# Patient Record
Sex: Male | Born: 1935 | Race: Asian | Hispanic: No | Marital: Married | Smoking: Never smoker
Health system: Southern US, Community
[De-identification: ages and names within clinical notes are randomized; demographics above are authoritative.]

## PROBLEM LIST (undated history)

## (undated) DIAGNOSIS — I639 Cerebral infarction, unspecified: Secondary | ICD-10-CM

## (undated) DIAGNOSIS — G459 Transient cerebral ischemic attack, unspecified: Secondary | ICD-10-CM

## (undated) DIAGNOSIS — E119 Type 2 diabetes mellitus without complications: Secondary | ICD-10-CM

## (undated) DIAGNOSIS — I10 Essential (primary) hypertension: Secondary | ICD-10-CM

## (undated) DIAGNOSIS — R270 Ataxia, unspecified: Secondary | ICD-10-CM

## (undated) DIAGNOSIS — M1990 Unspecified osteoarthritis, unspecified site: Secondary | ICD-10-CM

## (undated) DIAGNOSIS — E785 Hyperlipidemia, unspecified: Secondary | ICD-10-CM

## (undated) HISTORY — PX: OTHER SURGICAL HISTORY: SHX169

---

## 2004-12-09 ENCOUNTER — Ambulatory Visit: Payer: Self-pay

## 2004-12-12 ENCOUNTER — Ambulatory Visit: Payer: Self-pay

## 2005-08-18 ENCOUNTER — Ambulatory Visit: Payer: Self-pay | Admitting: Ophthalmology

## 2005-08-25 ENCOUNTER — Ambulatory Visit: Payer: Self-pay | Admitting: Ophthalmology

## 2007-05-21 ENCOUNTER — Ambulatory Visit: Payer: Self-pay | Admitting: Family Medicine

## 2010-07-31 ENCOUNTER — Encounter: Payer: Self-pay | Admitting: Family Medicine

## 2010-08-07 ENCOUNTER — Encounter: Payer: Self-pay | Admitting: Family Medicine

## 2010-08-14 ENCOUNTER — Ambulatory Visit: Payer: Self-pay | Admitting: Family Medicine

## 2010-09-05 ENCOUNTER — Encounter: Payer: Self-pay | Admitting: Family Medicine

## 2010-10-15 ENCOUNTER — Ambulatory Visit: Payer: Self-pay | Admitting: Family Medicine

## 2010-10-20 ENCOUNTER — Inpatient Hospital Stay: Payer: Self-pay | Admitting: Internal Medicine

## 2010-12-27 ENCOUNTER — Emergency Department: Payer: Self-pay | Admitting: Emergency Medicine

## 2012-06-15 ENCOUNTER — Emergency Department: Payer: Self-pay | Admitting: Emergency Medicine

## 2012-07-17 ENCOUNTER — Emergency Department: Payer: Self-pay | Admitting: Emergency Medicine

## 2012-07-17 LAB — COMPREHENSIVE METABOLIC PANEL
Anion Gap: 7 (ref 7–16)
Bilirubin,Total: 0.3 mg/dL (ref 0.2–1.0)
Chloride: 103 mmol/L (ref 98–107)
Creatinine: 1.26 mg/dL (ref 0.60–1.30)
EGFR (African American): >60
EGFR (Non-African Amer.): 52 — ABNORMAL LOW
Potassium: 3.8 mmol/L (ref 3.5–5.1)
SGPT (ALT): 23 U/L (ref 12–78)
Sodium: 137 mmol/L (ref 136–145)

## 2012-07-17 LAB — CBC
HCT: 37.9 % — ABNORMAL LOW (ref 40.0–52.0)
MCH: 30.9 pg (ref 26.0–34.0)
MCHC: 34.4 g/dL (ref 32.0–36.0)
MCV: 90 fL (ref 80–100)
Platelet: 230 10*3/uL (ref 150–440)
RDW: 13.7 % (ref 11.5–14.5)

## 2012-07-17 LAB — URINALYSIS, COMPLETE
Bacteria: NONE SEEN
Blood: NEGATIVE
Glucose,UR: 150 mg/dL (ref 0–75)
Ketone: NEGATIVE
Leukocyte Esterase: NEGATIVE
RBC,UR: 1 /HPF (ref 0–5)
Specific Gravity: 1.016 (ref 1.003–1.030)
Squamous Epithelial: <1
WBC UR: <1 /HPF (ref 0–5)

## 2012-07-17 LAB — CK TOTAL AND CKMB (NOT AT ARMC): CK-MB: 0.8 ng/mL (ref 0.5–3.6)

## 2012-07-19 ENCOUNTER — Encounter: Payer: Self-pay | Admitting: Family Medicine

## 2012-08-07 ENCOUNTER — Encounter: Payer: Self-pay | Admitting: Family Medicine

## 2012-09-04 ENCOUNTER — Encounter: Payer: Self-pay | Admitting: Family Medicine

## 2012-09-21 ENCOUNTER — Ambulatory Visit: Payer: Self-pay | Admitting: Family Medicine

## 2013-08-25 ENCOUNTER — Encounter: Payer: Self-pay | Admitting: Family Medicine

## 2013-09-04 ENCOUNTER — Encounter: Payer: Self-pay | Admitting: Family Medicine

## 2015-07-03 ENCOUNTER — Inpatient Hospital Stay: Payer: Medicare Other

## 2015-07-03 ENCOUNTER — Inpatient Hospital Stay
Admission: EM | Admit: 2015-07-03 | Discharge: 2015-07-05 | DRG: 065 | Disposition: A | Discharge status: Home / Self Care | Payer: Medicare Other | Attending: Internal Medicine | Admitting: Internal Medicine

## 2015-07-03 ENCOUNTER — Encounter: Payer: Self-pay | Admitting: Emergency Medicine

## 2015-07-03 ENCOUNTER — Emergency Department: Payer: Medicare Other

## 2015-07-03 DIAGNOSIS — I639 Cerebral infarction, unspecified: Secondary | ICD-10-CM | POA: Diagnosis present

## 2015-07-03 DIAGNOSIS — R531 Weakness: Secondary | ICD-10-CM | POA: Diagnosis not present

## 2015-07-03 DIAGNOSIS — Z23 Encounter for immunization: Secondary | ICD-10-CM | POA: Diagnosis not present

## 2015-07-03 DIAGNOSIS — Z87891 Personal history of nicotine dependence: Secondary | ICD-10-CM

## 2015-07-03 DIAGNOSIS — Z7982 Long term (current) use of aspirin: Secondary | ICD-10-CM | POA: Diagnosis not present

## 2015-07-03 DIAGNOSIS — Z8673 Personal history of transient ischemic attack (TIA), and cerebral infarction without residual deficits: Secondary | ICD-10-CM

## 2015-07-03 DIAGNOSIS — E119 Type 2 diabetes mellitus without complications: Secondary | ICD-10-CM | POA: Diagnosis not present

## 2015-07-03 DIAGNOSIS — Z888 Allergy status to other drugs, medicaments and biological substances status: Secondary | ICD-10-CM

## 2015-07-03 DIAGNOSIS — M1990 Unspecified osteoarthritis, unspecified site: Secondary | ICD-10-CM | POA: Diagnosis not present

## 2015-07-03 DIAGNOSIS — I6350 Cerebral infarction due to unspecified occlusion or stenosis of unspecified cerebral artery: Secondary | ICD-10-CM | POA: Diagnosis not present

## 2015-07-03 DIAGNOSIS — E785 Hyperlipidemia, unspecified: Secondary | ICD-10-CM | POA: Diagnosis not present

## 2015-07-03 DIAGNOSIS — G8191 Hemiplegia, unspecified affecting right dominant side: Secondary | ICD-10-CM | POA: Diagnosis present

## 2015-07-03 DIAGNOSIS — I10 Essential (primary) hypertension: Secondary | ICD-10-CM | POA: Diagnosis present

## 2015-07-03 DIAGNOSIS — Z79899 Other long term (current) drug therapy: Secondary | ICD-10-CM | POA: Diagnosis not present

## 2015-07-03 HISTORY — DX: Unspecified osteoarthritis, unspecified site: M19.90

## 2015-07-03 HISTORY — DX: Hyperlipidemia, unspecified: E78.5

## 2015-07-03 HISTORY — DX: Transient cerebral ischemic attack, unspecified: G45.9

## 2015-07-03 HISTORY — DX: Type 2 diabetes mellitus without complications: E11.9

## 2015-07-03 HISTORY — DX: Ataxia, unspecified: R27.0

## 2015-07-03 HISTORY — DX: Essential (primary) hypertension: I10

## 2015-07-03 LAB — COMPREHENSIVE METABOLIC PANEL
ALBUMIN: 4.2 g/dL (ref 3.5–5.0)
ALK PHOS: 65 U/L (ref 38–126)
ALT: 19 U/L (ref 17–63)
AST: 25 U/L (ref 15–41)
Anion gap: 8 (ref 5–15)
BUN: 23 mg/dL — AB (ref 6–20)
CALCIUM: 9.4 mg/dL (ref 8.9–10.3)
CO2: 28 mmol/L (ref 22–32)
CREATININE: 1.04 mg/dL (ref 0.61–1.24)
Chloride: 101 mmol/L (ref 101–111)
GFR calc non Af Amer: >60 mL/min (ref >60)
Glucose, Bld: 136 mg/dL — ABNORMAL HIGH (ref 65–99)
Potassium: 3.8 mmol/L (ref 3.5–5.1)
SODIUM: 137 mmol/L (ref 135–145)
TOTAL PROTEIN: 7.4 g/dL (ref 6.5–8.1)
Total Bilirubin: 0.6 mg/dL (ref 0.3–1.2)

## 2015-07-03 LAB — PROTIME-INR
INR: 1.03
Prothrombin Time: 13.7 seconds (ref 11.4–15.0)

## 2015-07-03 LAB — URINALYSIS COMPLETE WITH MICROSCOPIC (ARMC ONLY)
BILIRUBIN URINE: NEGATIVE
Bacteria, UA: NONE SEEN
Glucose, UA: 50 mg/dL — AB
Ketones, ur: NEGATIVE mg/dL
Leukocytes, UA: NEGATIVE
Nitrite: NEGATIVE
PH: 5 (ref 5.0–8.0)
PROTEIN: NEGATIVE mg/dL
SQUAMOUS EPITHELIAL / LPF: NONE SEEN
Specific Gravity, Urine: 1.011 (ref 1.005–1.030)
WBC, UA: NONE SEEN WBC/hpf (ref 0–5)

## 2015-07-03 LAB — APTT: aPTT: 31 seconds (ref 24–36)

## 2015-07-03 LAB — MRSA PCR SCREENING: MRSA by PCR: NEGATIVE

## 2015-07-03 LAB — CBC
HCT: 40.4 % (ref 40.0–52.0)
HCT: 42.6 % (ref 40.0–52.0)
HEMOGLOBIN: 14.3 g/dL (ref 13.0–18.0)
Hemoglobin: 13.6 g/dL (ref 13.0–18.0)
MCH: 29 pg (ref 26.0–34.0)
MCH: 29.1 pg (ref 26.0–34.0)
MCHC: 33.6 g/dL (ref 32.0–36.0)
MCHC: 33.6 g/dL (ref 32.0–36.0)
MCV: 86.3 fL (ref 80.0–100.0)
MCV: 86.4 fL (ref 80.0–100.0)
PLATELETS: 200 10*3/uL (ref 150–440)
Platelets: 206 10*3/uL (ref 150–440)
RBC: 4.68 MIL/uL (ref 4.40–5.90)
RBC: 4.93 MIL/uL (ref 4.40–5.90)
RDW: 13.6 % (ref 11.5–14.5)
RDW: 13.7 % (ref 11.5–14.5)
WBC: 6.7 10*3/uL (ref 3.8–10.6)
WBC: 7.9 10*3/uL (ref 3.8–10.6)

## 2015-07-03 LAB — CREATININE, SERUM
CREATININE: 0.96 mg/dL (ref 0.61–1.24)
GFR calc Af Amer: >60 mL/min (ref >60)

## 2015-07-03 LAB — TROPONIN I

## 2015-07-03 MED ORDER — ASPIRIN 81 MG PO CHEW
81.0000 mg | CHEWABLE_TABLET | Freq: Every day | ORAL | Status: DC
  Administered 2015-07-04 – 2015-07-05 (×2): 81 mg via ORAL
  Filled 2015-07-03 (×2): qty 1

## 2015-07-03 MED ORDER — MELOXICAM 7.5 MG PO TABS
7.5000 mg | ORAL_TABLET | Freq: Every day | ORAL | Status: DC
Start: 2015-07-03 — End: 2015-07-05
  Administered 2015-07-04 – 2015-07-05 (×2): 7.5 mg via ORAL
  Filled 2015-07-03 (×3): qty 1

## 2015-07-03 MED ORDER — METOPROLOL SUCCINATE ER 50 MG PO TB24
50.0000 mg | ORAL_TABLET | Freq: Every day | ORAL | Status: DC
  Administered 2015-07-03 – 2015-07-05 (×3): 50 mg via ORAL
  Filled 2015-07-03 (×3): qty 1

## 2015-07-03 MED ORDER — HYDRALAZINE HCL 20 MG/ML IJ SOLN
20.0000 mg | Freq: Four times a day (QID) | INTRAMUSCULAR | Status: DC | PRN
  Administered 2015-07-03: 20 mg via INTRAVENOUS
  Filled 2015-07-03: qty 1

## 2015-07-03 MED ORDER — VITAMIN B-12 1000 MCG PO TABS
1000.0000 ug | ORAL_TABLET | Freq: Every day | ORAL | Status: DC
Start: 2015-07-03 — End: 2015-07-05
  Administered 2015-07-04 – 2015-07-05 (×2): 1000 ug via ORAL
  Filled 2015-07-03 (×2): qty 1

## 2015-07-03 MED ORDER — GLIPIZIDE ER 5 MG PO TB24
5.0000 mg | ORAL_TABLET | Freq: Every day | ORAL | Status: DC
Start: 2015-07-03 — End: 2015-07-05
  Administered 2015-07-04 – 2015-07-05 (×2): 5 mg via ORAL
  Filled 2015-07-03 (×2): qty 1

## 2015-07-03 MED ORDER — ASPIRIN EC 325 MG PO TBEC: 325.0000 mg | DELAYED_RELEASE_TABLET | Freq: Every day | ORAL | Status: DC

## 2015-07-03 MED ORDER — CLOPIDOGREL BISULFATE 75 MG PO TABS
75.0000 mg | ORAL_TABLET | Freq: Every day | ORAL | Status: DC
  Administered 2015-07-04 – 2015-07-05 (×2): 75 mg via ORAL
  Filled 2015-07-03 (×2): qty 1

## 2015-07-03 MED ORDER — ADULT MULTIVITAMIN W/MINERALS CH
1.0000 | ORAL_TABLET | Freq: Every day | ORAL | Status: DC
  Administered 2015-07-04 – 2015-07-05 (×2): 1 via ORAL
  Filled 2015-07-03 (×2): qty 1

## 2015-07-03 MED ORDER — ENOXAPARIN SODIUM 40 MG/0.4ML ~~LOC~~ SOLN
40.0000 mg | SUBCUTANEOUS | Status: DC
  Administered 2015-07-04: 40 mg via SUBCUTANEOUS
  Filled 2015-07-03: qty 0.4

## 2015-07-03 MED ORDER — PNEUMOCOCCAL VAC POLYVALENT 25 MCG/0.5ML IJ INJ
0.5000 mL | INJECTION | INTRAMUSCULAR | Status: AC
  Administered 2015-07-05: 11:00:00 0.5 mL via INTRAMUSCULAR
  Filled 2015-07-03: qty 0.5

## 2015-07-03 MED ORDER — ASPIRIN 81 MG PO CHEW
324.0000 mg | CHEWABLE_TABLET | Freq: Once | ORAL | Status: AC
  Administered 2015-07-03: 324 mg via ORAL
  Filled 2015-07-03: qty 4

## 2015-07-03 MED ORDER — PRAVASTATIN SODIUM 40 MG PO TABS
40.0000 mg | ORAL_TABLET | Freq: Every day | ORAL | Status: DC
  Administered 2015-07-04 – 2015-07-05 (×2): 40 mg via ORAL
  Filled 2015-07-03: qty 1
  Filled 2015-07-03: qty 2

## 2015-07-03 MED ORDER — ENALAPRIL MALEATE 10 MG PO TABS
20.0000 mg | ORAL_TABLET | Freq: Two times a day (BID) | ORAL | Status: DC
  Administered 2015-07-03: 20 mg via ORAL
  Filled 2015-07-03: qty 1
  Filled 2015-07-03: qty 2

## 2015-07-03 MED ORDER — INFLUENZA VAC SPLIT QUAD 0.5 ML IM SUSY
0.5000 mL | PREFILLED_SYRINGE | INTRAMUSCULAR | Status: AC
  Administered 2015-07-05: 0.5 mL via INTRAMUSCULAR
  Filled 2015-07-03: qty 0.5

## 2015-07-03 MED ORDER — METFORMIN HCL 500 MG PO TABS
1000.0000 mg | ORAL_TABLET | Freq: Two times a day (BID) | ORAL | Status: DC
  Administered 2015-07-03 – 2015-07-05 (×4): 1000 mg via ORAL
  Filled 2015-07-03 (×4): qty 2

## 2015-07-03 NOTE — ED Notes (Signed)
Patient transported to MRI 

## 2015-07-03 NOTE — ED Notes (Signed)
States on Sunday morning at 0200, experienced right sided weakness, making it difficult for patient to walk around and move.  Patient states he has been getting better since onset, but does not feel totally back to normal.

## 2015-07-03 NOTE — Progress Notes (Signed)
ANTICOAGULATION CONSULT NOTE - Initial Consult  Pharmacy Consult for Lovenox  Indication: VTE prophylaxis  Allergies  Allergen Reactions  . Hydrochlorothiazide     Unknown reaction    Patient Measurements: Height: 5\' 5"  (165.1 cm) Weight: 149 lb 14.6 oz (68 kg) IBW/kg (Calculated) : 61.5 Heparin Dosing Weight:   Vital Signs: Temp: 97.6 F (36.4 C) (12/27 1934) Temp Source: Oral (12/27 1934) BP: 194/98 mmHg (12/27 1934) Pulse Rate: 72 (12/27 1934)  Labs:  Recent Labs  07/03/15 1422  HGB 13.6  HCT 40.4  PLT 200  APTT 31  LABPROT 13.7  INR 1.03  CREATININE 1.04  TROPONINI <0.03    Estimated Creatinine Clearance: 43.5 mL/min (by C-G formula based on Cr of 1.04).   Medical History: Past Medical History  Diagnosis Date  . TIA (transient ischemic attack)   . Diabetes mellitus without complication (HCC)   . Hypertension   . Dyslipidemia   . Arthritis   . Ataxia     Medications:  Prescriptions prior to admission  Medication Sig Dispense Refill Last Dose  . aspirin EC 325 MG tablet Take 325 mg by mouth daily.   07/03/2015 at Unknown time  . clopidogrel (PLAVIX) 75 MG tablet Take 75 mg by mouth daily.   07/03/2015 at Unknown time  . enalapril (VASOTEC) 20 MG tablet Take 1 tablet by mouth 2 (two) times daily.   07/03/2015 at Unknown time  . glipiZIDE (GLUCOTROL XL) 5 MG 24 hr tablet Take 1 tablet by mouth daily.   07/03/2015 at Unknown time  . meloxicam (MOBIC) 7.5 MG tablet Take 1 tablet by mouth daily.   07/03/2015 at Unknown time  . metFORMIN (GLUCOPHAGE) 500 MG tablet Take 2 tablets by mouth 2 (two) times daily.   07/03/2015 at Unknown time  . metoprolol succinate (TOPROL-XL) 50 MG 24 hr tablet Take 1 tablet by mouth daily.   07/03/2015 at Unknown time  . Multiple Vitamin (MULTI-VITAMINS) TABS Take 1 tablet by mouth daily.   07/03/2015 at Unknown time  . pravastatin (PRAVACHOL) 40 MG tablet Take 40 mg by mouth daily.   07/03/2015 at Unknown time  .  triamcinolone ointment (KENALOG) 0.1 % Apply 1 application topically 2 (two) times daily.   07/03/2015 at Unknown time  . vitamin B-12 (CYANOCOBALAMIN) 1000 MCG tablet Take 1,000 mcg by mouth daily.   07/03/2015 at Unknown time    Assessment: CrCl = 44 ml/min  Goal of Therapy:  DVT prophylaxis   Plan:  Lovenox 30 mg SQ Q24H originally ordered.  Will adjust dose to lovenox 40 mg SQ Q24H based on CrCl > 30 ml/min.   Charman Blasco D 07/03/2015,7:52 PM

## 2015-07-03 NOTE — ED Provider Notes (Signed)
[facility] Emergency Department Provider Note  ____________________________________________  Time seen: On arrival  I have reviewed the triage vital signs and the nursing notes.   HISTORY  Chief Complaint Weakness    HPI Christopher [name] is a 81 y.o. male who reports 2 days ago he lost the ability to move his right arm and right leg for several hours. He reports this did improve and his strength feels almost equivalent today. He denies headache. He does have a history of diabetes and high blood pressure. His son believes he has had a TIA in the past. His son feels that his speech is not normal and when he ambulates he apparently is dragging his leg per his physician.     Past Medical History  Diagnosis Date  . TIA (transient ischemic attack)   . Diabetes mellitus without complication (HCC)   . Hypertension     There are no active problems to display for this patient.   History reviewed. No pertinent past surgical history.  No current outpatient prescriptions on file.  Allergies Hydrochlorothiazide  No family history on file.  Social History Social History  Substance Use Topics  . Smoking status: Former Games developermoker  . Smokeless tobacco: None  . Alcohol Use: No    Review of Systems  Constitutional: Negative for fever. Eyes: Negative for visual changes. ENT: Negative for sore throat Cardiovascular: Negative for chest pain. Respiratory: Negative for shortness of breath. Gastrointestinal: Negative for abdominal pain, vomiting  Genitourinary: Negative for dysuria. Musculoskeletal: Negative for back pain. Skin: Negative for rash. Neurological: As above Psychiatric:No anxiety    ____________________________________________   PHYSICAL EXAM:  VITAL SIGNS: ED Triage Vitals  Enc Vitals Group     BP 07/03/15 1312 179/94 mmHg     Pulse Rate 07/03/15 1312 87     Resp 07/03/15 1312 18     Temp 07/03/15 1312 98 F (36.7 C)     Temp Source  07/03/15 1312 Oral     SpO2 07/03/15 1312 96 %     Weight 07/03/15 1312 152 lb (68.947 kg)     Height 07/03/15 1312 5\' 6"  (1.676 m)     Head Cir --      Peak Flow --      Pain Score --      Pain Loc --      Pain Edu? --      Excl. in GC? --      Constitutional: Alert and oriented. Well appearing and in no distress. Eyes: Conjunctivae are normal.  ENT   Head: Normocephalic and atraumatic.   Mouth/Throat: Mucous membranes are moist. Cardiovascular: Normal rate, regular rhythm. Normal and symmetric distal pulses are present in all extremities. No murmurs, rubs, or gallops. Respiratory: Normal respiratory effort without tachypnea nor retractions. Breath sounds are clear and equal bilaterally.  Gastrointestinal: Soft and non-tender in all quadrants. No distention. There is no CVA tenderness. Genitourinary: deferred Musculoskeletal: Nontender with normal range of motion in all extremities. No lower extremity tenderness nor edema. Neurologic:  Some difficulty with word finding. Strength appears normal in all extremities.. Cranial nerves II through XII are normal. Skin:  Skin is warm, dry and intact. No rash noted. Psychiatric: Mood and affect are normal. Patient exhibits appropriate insight and judgment.  ____________________________________________    LABS (pertinent positives/negatives)  Labs Reviewed  APTT  COMPREHENSIVE METABOLIC PANEL  TROPONIN I  PROTIME-INR  CBC  URINALYSIS COMPLETEWITH MICROSCOPIC ([facility] ONLY)    ____________________________________________   EKG  ED ECG REPORT I, Jene Every, the attending physician, personally viewed and interpreted this ECG.  Date: 07/03/2015 EKG Time: 2:13 PM Rate: 87 Rhythm: normal sinus rhythm QRS Axis: normal Intervals: normal ST/T Wave abnormalities: normal Conduction Disutrbances: LAFB Narrative Interpretation: unremarkable   ____________________________________________    RADIOLOGY I have personally  reviewed any xrays that were ordered on this patient: CT shows possible new lacunar infarcts  ____________________________________________   PROCEDURES  Procedure(s) performed: none  Critical Care performed: none  ____________________________________________   INITIAL IMPRESSION / ASSESSMENT AND PLAN / ED COURSE  Pertinent labs & imaging results that were available during my care of the patient were reviewed by me and considered in my medical decision making (see chart for details).  History of present illness is concerning for TIA versus CVA. He does apparently have some residual right leg weakness. He also has a history of diabetes and high blood pressure. His CT scan is concerning for new lacunar infarcts  I discussed the case with Dr. Loretha Brasil who recommends admission for MRI and full stroke workup. Aspirin given in the emergency department  ____________________________________________   FINAL CLINICAL IMPRESSION(S) / ED DIAGNOSES  Final diagnoses:  Cerebral infarction due to unspecified mechanism     Jene Every, MD 07/03/15 [phone]

## 2015-07-03 NOTE — H&P (Signed)
[facility] [place] at [facility]   PATIENT NAME: Christopher [name]    MR#:  [id number]  DATE OF BIRTH:  [birth year]  DATE OF ADMISSION:  07/03/2015  PRIMARY CARE PHYSICIAN: No primary care provider on file.   REQUESTING/REFERRING PHYSICIAN: Dr. Cyril Loosen  CHIEF COMPLAINT: Right-sided weakness    Chief Complaint  Patient presents with  . Weakness    HISTORY OF PRESENT ILLNESS:  [name]  is a 81 y.o. male with a known history of hypertension, diabetes mellitus, hyperlipidemia noted to have right-sided weakness Saturday. Patient had difficulty walking secondary to weakness on the right side. Patient symptoms slowly improved and he feels little stronger on the right side compared to 2 days ago. However CT of the head showed left thalamic stroke in the emergency room. Patient had perioral numbness on the right side on Saturday. Now he denies any numbness of hand leg, right side of the face and right side of her lips. Does have a slurred speech according to him he has been having this slurred speech for like 4 months. Past Medical History  Diagnosis Date  . TIA (transient ischemic attack)   . Diabetes mellitus without complication (HCC)   . Hypertension   . Dyslipidemia   . Arthritis   . Ataxia     PAST SURGICAL HISTOIRY:  History reviewed. No pertinent past surgical history. . No previous surgeries. SOCIAL HISTORY:   Social History  Substance Use Topics  . Smoking status: Former Games developer  . Smokeless tobacco: Former Neurosurgeon  . Alcohol Use: No    FAMILY HISTORY:  No family history on file.  DRUG ALLERGIES:   Allergies  Allergen Reactions  . Hydrochlorothiazide     Unknown reaction    REVIEW OF SYSTEMS:  CONSTITUTIONAL: No fever, fatigue or weakness.  EYES: No blurred or double vision.  EARS, NOSE, AND THROAT: No tinnitus or ear pain.  RESPIRATORY: No cough, shortness of breath, wheezing or hemoptysis.  CARDIOVASCULAR: No chest pain, orthopnea,  edema.  GASTROINTESTINAL: No nausea, vomiting, diarrhea or abdominal pain.  GENITOURINARY: No dysuria, hematuria.  ENDOCRINE: No polyuria, nocturia,  HEMATOLOGY: No anemia, easy bruising or bleeding SKIN: No rash or lesion. MUSCULOSKELETAL: No joint pain or arthritis.   NEUROLOGIC: Slight right-sided weakness, slurred speech noted.Marland Kitchen  PSYCHIATRY: No anxiety or depression.   MEDICATIONS AT HOME:   Prior to Admission medications   Medication Sig Start Date End Date Taking? Authorizing Provider  aspirin EC 325 MG tablet Take 325 mg by mouth daily.   Yes Historical Provider, MD  clopidogrel (PLAVIX) 75 MG tablet Take 75 mg by mouth daily. 01/21/11  Yes Historical Provider, MD  enalapril (VASOTEC) 20 MG tablet Take 1 tablet by mouth 2 (two) times daily. 06/21/15  Yes Historical Provider, MD  glipiZIDE (GLUCOTROL XL) 5 MG 24 hr tablet Take 1 tablet by mouth daily. 06/21/15  Yes Historical Provider, MD  meloxicam (MOBIC) 7.5 MG tablet Take 1 tablet by mouth daily. 06/21/15  Yes Historical Provider, MD  metFORMIN (GLUCOPHAGE) 500 MG tablet Take 2 tablets by mouth 2 (two) times daily. 04/23/15  Yes Historical Provider, MD  metoprolol succinate (TOPROL-XL) 50 MG 24 hr tablet Take 1 tablet by mouth daily. 06/21/15  Yes Historical Provider, MD  Multiple Vitamin (MULTI-VITAMINS) TABS Take 1 tablet by mouth daily.   Yes Historical Provider, MD  pravastatin (PRAVACHOL) 40 MG tablet Take 40 mg by mouth daily. 01/21/11  Yes Historical Provider, MD  triamcinolone ointment (KENALOG) 0.1 %  Apply 1 application topically 2 (two) times daily.   Yes Historical Provider, MD  vitamin B-12 (CYANOCOBALAMIN) 1000 MCG tablet Take 1,000 mcg by mouth daily.   Yes Historical Provider, MD      VITAL SIGNS:  Blood pressure 215/111, pulse 78, temperature 97.8 F (36.6 C), temperature source Oral, resp. rate 18, height 5\' 3"  (1.6 m), weight 69.854 kg (154 lb), SpO2 100 %.  PHYSICAL EXAMINATION:  GENERAL:  81 y.o.-year-old  patient lying in the bed with no acute distress.  EYES: Pupils equal, round, reactive to light and accommodation. No scleral icterus. Extraocular muscles intact.  HEENT: Head atraumatic, normocephalic. Oropharynx and nasopharynx clear.  NECK:  Supple, no jugular venous distention. No thyroid enlargement, no tenderness.  LUNGS: Normal breath sounds bilaterally, no wheezing, rales,rhonchi or crepitation. No use of accessory muscles of respiration.  CARDIOVASCULAR: S1, S2 normal. No murmurs, rubs, or gallops.  ABDOMEN: Soft, nontender, nondistended. Bowel sounds present. No organomegaly or mass.  EXTREMITIES: No pedal edema, cyanosis, or clubbing.  NEUROLOGIC: Cranial nerves II through XII are intact. Muscle strength 5/5 in all extremities. Sensation intact. Gait not checked.  PSYCHIATRIC: The patient is alert and oriented x 3.  SKIN: No obvious rash, lesion, or ulcer.   LABORATORY PANEL:   CBC  Recent Labs Lab 07/03/15 1422  WBC 6.7  HGB 13.6  HCT 40.4  PLT 200   ------------------------------------------------------------------------------------------------------------------  Chemistries   Recent Labs Lab 07/03/15 1422  NA 137  K 3.8  CL 101  CO2 28  GLUCOSE 136*  BUN 23*  CREATININE 1.04  CALCIUM 9.4  AST 25  ALT 19  ALKPHOS 65  BILITOT 0.6   ------------------------------------------------------------------------------------------------------------------  Cardiac Enzymes  Recent Labs Lab 07/03/15 1422  TROPONINI <0.03   ------------------------------------------------------------------------------------------------------------------  RADIOLOGY:  Ct Head Wo Contrast  07/03/2015  CLINICAL DATA:  Right-sided weakness. EXAM: CT HEAD WITHOUT CONTRAST TECHNIQUE: Contiguous axial images were obtained from the base of the skull through the vertex without intravenous contrast. COMPARISON:  07/17/2012 FINDINGS: Mild cerebral atrophy is within normal limits for age.  There are 5 mm hypodensities in the thalami, more well-defined on the left and both new from the prior CT. There is no evidence of acute large territory cortical infarct, intracranial hemorrhage, mass, midline shift, or extra-axial fluid collection. Periventricular white-matter hypodensities are similar to the prior study and nonspecific but compatible with mild chronic small vessel ischemic disease. A chronic lacunar infarct is noted in the white matter adjacent to the right frontal horn. Prior bilateral cataract extraction is noted. The visualized paranasal sinuses and mastoid air cells are clear. Calcified atherosclerosis is noted at the skullbase. No skull fracture is seen. IMPRESSION: 1. No acute intracranial hemorrhage. 2. New lacunar infarcts in the thalami. These are of indeterminate acuity, although the left-sided infarct is favored to be chronic. 3. Chronic small vessel ischemic disease in the cerebral white matter. Electronically Signed   By: Sebastian AcheAllen  Grady M.D.   On: 07/03/2015 13:40    EKG:   Orders placed or performed during the hospital encounter of 07/03/15  . ED EKG  . ED EKG   sinus rhythm with 87 bpm   IMPRESSION AND PLAN:  [age] year old the male patient with right-sided weakness found to have a left thalamic stroke admitted to hospitalist service to stroke unit for acute thalamic stroke. Right-sided weakness, right perioral numbness symptoms are resolved at this time but because of his risk factors of hypertension diabetes patient needs further workup including MRI of the  brain and carotid ultrasound echocardiogram, neurology consult, neuro checks. Continue aspirin, Plavix, statins, physical therapy consult, speech therapy consult, obtain hemoglobin A1c, fasting lipids.  #2 hypertension: Patient blood pressure is elevated to 220/142. Because of that Should keep the blood pressure around 190/90 to keep the cerebral perfusion. His home medications metoprolol, Vasotec., restarted #3  diabetes mellitus type 2: Restarted home medications, had insulin with sliding scale coverage  D/w the plan with  Pt,he is agreeable for further work up Dr.Zelikman is contacted by ER MD..     All the records are reviewed and case discussed with ED provider. Management plans discussed with the patient, family and they are in agreement.  CODE STATUS: full  TOTAL TIME TAKING CARE OF THIS PATIENT: 55 minutes.    Katha Hamming M.D on 07/03/2015 at 4:37 PM  Between 7am to 6pm - Pager - [phone]  After 6pm go to [email or web address] - password [email or web address] [facility]  [place]  Office  [phone]  CC: Primary care physician; No primary care provider on file.  Note: This dictation was prepared with Dragon dictation along with smaller phrase technology. Any transcriptional errors that result from this process are unintentional.

## 2015-07-03 NOTE — ED Notes (Signed)
Patient's Daughter in law's number (first contact):  Georgeann OppenheimFariha Saif  [phone].  Second contact is patient's son:  Burna SisSaif  [phone]

## 2015-07-03 NOTE — ED Notes (Signed)
Called patient's daughter in law to update on patient's planned admission, no answer, message left.

## 2015-07-03 NOTE — ED Notes (Signed)
REturn from MRI.  Family at bedside.

## 2015-07-03 NOTE — ED Notes (Signed)
Pt with right sided weakness started on Saturday. Saw pcp and was sent to ed for further eval. Pt able to move right arm and leg in triage. Pt with hx of tia.

## 2015-07-03 NOTE — Progress Notes (Signed)
eLink Physician-Brief Progress Note Patient Name: Christopher [name] DOB: 07/07/1926 MRN: [id number]   Date of Service  07/03/2015  HPI/Events of Note  81 yo with acute CVA  eICU Interventions  Avoid secondary brain injury, recommend DNR status, SD status.         [name] 07/03/2015, 8:57 PM

## 2015-07-04 ENCOUNTER — Inpatient Hospital Stay (HOSPITAL_COMMUNITY)
Admit: 2015-07-04 | Discharge: 2015-07-04 | Disposition: A | Discharge status: Home / Self Care | Payer: Medicare Other | Attending: Internal Medicine | Admitting: Internal Medicine

## 2015-07-04 DIAGNOSIS — I6350 Cerebral infarction due to unspecified occlusion or stenosis of unspecified cerebral artery: Secondary | ICD-10-CM

## 2015-07-04 DIAGNOSIS — I10 Essential (primary) hypertension: Secondary | ICD-10-CM

## 2015-07-04 DIAGNOSIS — I639 Cerebral infarction, unspecified: Principal | ICD-10-CM

## 2015-07-04 LAB — GLUCOSE, CAPILLARY
GLUCOSE-CAPILLARY: 119 mg/dL — AB (ref 65–99)
Glucose-Capillary: 118 mg/dL — ABNORMAL HIGH (ref 65–99)

## 2015-07-04 LAB — HEMOGLOBIN A1C: Hgb A1c MFr Bld: 7.3 % — ABNORMAL HIGH (ref 4.0–6.0)

## 2015-07-04 NOTE — Evaluation (Signed)
Physical Therapy Evaluation Patient Details Name: Christopher [name] MRN: [id number] DOB: Apr 15, 1926 Today's Date: 07/04/2015   History of Present Illness  Pt is an 81 y.o. male presenting to hospital with R sided weakness since Saturday with difficulty walking.  MRI showing acute L posterior limb internal capsule, corticalspinal tract, and L thalamic subcentimeter infarction without hemorrhage; also numerous microbleeds representing chronic hypertensive cerebrovascular disease; also severe cervical spondylosis.  PMH includes htn, DM, HLD, TIA, ataxia.  Clinical Impression  Prior to admission, pt was independent ambulating with quad cane.  Pt [place]  Currently pt is CGA with transfers and CGA to min assist with ambulation 160 feet with quad cane (pt with occasional loss of balance to R requiring min assist to steady with mild LE ataxia noted but otherwise CGA).  Pt would benefit from skilled PT to address noted impairments and functional limitations.  Recommend pt discharge to home with 24/7 assist and HHPT when medically appropriate.     Follow Up Recommendations Home health PT;Supervision/Assistance - 24 hour    Equipment Recommendations       Recommendations for Other Services       Precautions / Restrictions Precautions Precautions: Fall Restrictions Weight Bearing Restrictions: No      Mobility  Bed Mobility Overal bed mobility: Needs Assistance Bed Mobility: Supine to Sit;Sit to Supine     Supine to sit: Modified independent (Device/Increase time);HOB elevated Sit to supine: Modified independent (Device/Increase time);HOB elevated      Transfers Overall transfer level: Needs assistance Equipment used: Quad cane Transfers: Sit to/from Stand Sit to Stand: Min guard         General transfer comment: pt pushing LE's against back of bed to stand; no loss of balance noted  Ambulation/Gait Ambulation/Gait assistance: Min  guard;Min assist Ambulation Distance (Feet): 160 Feet Assistive device: Quad cane   Gait velocity: varying   General Gait Details: occasional loss of balance to R requiring min assist to steady but otherwise CGA; decreased B step length/foot clearance/heelstrike  Stairs            Wheelchair Mobility    Modified Rankin (Stroke Patients Only)       Balance Overall balance assessment: Needs assistance Sitting-balance support: Bilateral upper extremity supported;Feet supported Sitting balance-Leahy Scale: Good     Standing balance support: Single extremity supported (on quad cane) Standing balance-Leahy Scale: Good                               Pertinent Vitals/Pain Pain Assessment: No/denies pain  See flowsheet for details of vitals.    Home Living Family/patient expects to be discharged to:: Private residence Living Arrangements: Children;Spouse/significant other (Wife, 2 sons, and daughter) Available Help at Discharge: Family   Home Access: Ramped entrance     Home Layout: Two level;Bed/bath upstairs Home Equipment: Cane - quad      Prior Function Level of Independence: Independent with assistive device(s)         Comments: Pt reports h/o balance issues and was told to always use his quad cane.  Pt denies any recent falls.  Pt reports his wife is very "forgetful" and can't help much.     Hand Dominance        Extremity/Trunk Assessment   Upper Extremity Assessment: RUE deficits/detail;LUE deficits/detail RUE Deficits / Details: R shoulder flexion, elbow flexion/extension all 4+/5; good hand grip  LUE Deficits / Details: L shoulder flexion, elbow flexion/extension, and hand grip WFL   Lower Extremity Assessment: RLE deficits/detail;LLE deficits/detail RLE Deficits / Details: R hip flexion, R knee flexion/extension, and DF 4/5; normal proprioception LLE Deficits / Details: L hip flexion, L knee flexion/extension, and DF WFL; normal  proprioception; mild coordination deficits noted with heel to shin B     Communication   Communication: HOH  Cognition Arousal/Alertness: Awake/alert Behavior During Therapy: WFL for tasks assessed/performed (impulsive at times) Overall Cognitive Status: Within Functional Limits for tasks assessed                      General Comments   Nursing cleared pt for participation in physical therapy.  Pt agreeable to PT session.    Exercises        Assessment/Plan    PT Assessment Patient needs continued PT services  PT Diagnosis Difficulty walking   PT Problem List Decreased balance;Decreased coordination  PT Treatment Interventions DME instruction;Gait training;Stair training;Functional mobility training;Therapeutic activities;Therapeutic exercise;Balance training;Neuromuscular re-education;Patient/family education   PT Goals (Current goals can be found in the Care Plan section) Acute Rehab PT Goals Patient Stated Goal: to go home PT Goal Formulation: With patient Time For Goal Achievement: 07/18/15 Potential to Achieve Goals: Good    Frequency 7X/week   Barriers to discharge        Co-evaluation               End of Session Equipment Utilized During Treatment: Gait belt Activity Tolerance: Patient tolerated treatment well Patient left: in bed;with call bell/phone within reach;with bed alarm set Nurse Communication: Mobility status;Precautions         Time: 1455-1520 PT Time Calculation (min) (ACUTE ONLY): 25 min   Charges:   PT Evaluation $Initial PT Evaluation Tier I: 1 Procedure     PT G CodesHendricks Limes July 18, 2015, 3:47 PM Hendricks Limes, PT [phone]

## 2015-07-04 NOTE — Progress Notes (Signed)
*  PRELIMINARY RESULTS* Echocardiogram 2D Echocardiogram has been performed.  Georgann HousekeeperJerry R Hege 07/04/2015, 2:57 PM

## 2015-07-04 NOTE — Progress Notes (Signed)
Report given to [facility].  Pt is alert and oriented. VSS. Has been up with PT. Transfer to room 117.  Left voice mail for son to call back so we could give him room number.

## 2015-07-04 NOTE — Progress Notes (Signed)
I have spoke with pt's son in person and on phone multiple times.  Neurologist has son's phone number and will call him. Son has been informed that test results come from the doctor. Also that pt has gotten all of his ordered medications.  Also that the stroke is permanent although the weakness has resolved. Pt's son asks same questions many times to staff and providers.  Pt's bed alarm is on. Lunch and dinner have been ordered by the son to facilitate cultural needs. Pt given newspaper. Awaiting transfer to floor

## 2015-07-04 NOTE — Progress Notes (Signed)
Pt remains alert and oriented, no complaints of pain.  NSR, lungs clear on room air.  Neuro check: all extremities strong, slight slurred speech.  BP was marginal earlier, now sbp 120-140's.  Voids in urinal.  VSS, afebrile.

## 2015-07-04 NOTE — H&P (Signed)
CC: R sided weakness   HPI: Christopher [name] is an 81 y.o. male  with a known history of hypertension, diabetes mellitus, hyperlipidemia noted to have right-sided weakness Saturday. Patient had difficulty walking secondary to weakness on the right side. Patient symptoms slowly improved and he feels little stronger on the right side compared to 2 days ago. CT of the head showed left thalamic stroke in the emergency room. Now back to baseline.  MRI brain L thalamic in L posterior limb internal capsule stroke.    Past Medical History  Diagnosis Date  . TIA (transient ischemic attack)   . Diabetes mellitus without complication (HCC)   . Hypertension   . Dyslipidemia   . Arthritis   . Ataxia     History reviewed. No pertinent past surgical history.  No family history on file.  Social History:  reports that he has quit smoking. He has quit using smokeless tobacco. He reports that he does not drink alcohol or use illicit drugs.  Allergies  Allergen Reactions  . Hydrochlorothiazide     Unknown reaction    Medications: I have reviewed the patient's current medications.  ROS: History obtained from the patient  General ROS: negative for - chills, fatigue, fever, night sweats, weight gain or weight loss Psychological ROS: negative for - behavioral disorder, hallucinations, memory difficulties, mood swings or suicidal ideation Ophthalmic ROS: negative for - blurry vision, double vision, eye pain or loss of vision ENT ROS: negative for - epistaxis, nasal discharge, oral lesions, sore throat, tinnitus or vertigo Allergy and Immunology ROS: negative for - hives or itchy/watery eyes Hematological and Lymphatic ROS: negative for - bleeding problems, bruising or swollen lymph nodes Endocrine ROS: negative for - galactorrhea, hair pattern changes, polydipsia/polyuria or temperature intolerance Respiratory ROS: negative for - cough, hemoptysis, shortness of breath or wheezing Cardiovascular ROS:  negative for - chest pain, dyspnea on exertion, edema or irregular heartbeat Gastrointestinal ROS: negative for - abdominal pain, diarrhea, hematemesis, nausea/vomiting or stool incontinence Genito-Urinary ROS: negative for - dysuria, hematuria, incontinence or urinary frequency/urgency Musculoskeletal ROS: negative for - joint swelling or muscular weakness Neurological ROS: as noted in HPI Dermatological ROS: negative for rash and skin lesion changes  Physical Examination: Blood pressure 128/70, pulse 79, temperature 97.4 F (36.3 C), temperature source Oral, resp. rate 19, height  (1.651 m), weight 149 lb 14.6 oz (68 kg), SpO2 99 %.  HEENT-  Normocephalic, no lesions, without obvious abnormality.  Normal external eye and conjunctiva.  Normal TM's bilaterally.  Normal auditory canals and external ears. Normal external nose, mucus membranes and septum.  Normal pharynx. Neurological Examination Mental Status: Alert, oriented, thought content appropriate.  Speech fluent without evidence of aphasia.  Able to follow 3 step commands without difficulty. Cranial Nerves: II: Discs flat bilaterally; Visual fields grossly normal, pupils equal, round, reactive to light and accommodation III,IV, VI: ptosis not present, extra-ocular motions intact bilaterally V,VII: smile symmetric, facial light touch sensation normal bilaterally VIII: hearing normal bilaterally IX,X: gag reflex present XI: bilateral shoulder shrug XII: midline tongue extension Motor: Right : Upper extremity   5/5    Left:     Upper extremity   5/5  Lower extremity   5/5     Lower extremity   5/5 Tone and bulk:normal tone throughout; no atrophy noted Sensory: Pinprick and light touch intact throughout, bilaterally Deep Tendon Reflexes: 2+ and symmetric throughout Plantars: Right: downgoing   Left: downgoing Cerebellar: normal finger-to-nose, normal rapid alternating movements and  normal heel-to-shin test Gait: not tested.        Laboratory Studies:   Basic Metabolic Panel:  Recent Labs Lab 07/03/15 1422 07/03/15 2014  NA 137  --   K 3.8  --   CL 101  --   CO2 28  --   GLUCOSE 136*  --   BUN 23*  --   CREATININE 1.04 0.96  CALCIUM 9.4  --     Liver Function Tests:  Recent Labs Lab 07/03/15 1422  AST 25  ALT 19  ALKPHOS 65  BILITOT 0.6  PROT 7.4  ALBUMIN 4.2   No results for input(s): LIPASE, AMYLASE in the last 168 hours. No results for input(s): AMMONIA in the last 168 hours.  CBC:  Recent Labs Lab 07/03/15 1422 07/03/15 2014  WBC 6.7 7.9  HGB 13.6 14.3  HCT 40.4 42.6  MCV 86.3 86.4  PLT 200 206    Cardiac Enzymes:  Recent Labs Lab 07/03/15 1422  TROPONINI <0.03    BNP: Invalid input(s): POCBNP  CBG:  Recent Labs Lab 07/04/15 0643  GLUCAP 118*    Microbiology: Results for orders placed or performed during the hospital encounter of 07/03/15  MRSA PCR Screening     Status: None   Collection Time: 07/03/15  7:50 PM  Result Value Ref Range Status   MRSA by PCR NEGATIVE NEGATIVE Final    Comment:        The GeneXpert MRSA Assay (FDA approved for NASAL specimens only), is one component of a comprehensive MRSA colonization surveillance program. It is not intended to diagnose MRSA infection nor to guide or monitor treatment for MRSA infections.     Coagulation Studies:  Recent Labs  07/03/15 1422  LABPROT 13.7  INR 1.03    Urinalysis:  Recent Labs Lab 07/03/15 1422  COLORURINE YELLOW*  LABSPEC 1.011  PHURINE 5.0  GLUCOSEU 50*  HGBUR 1+*  BILIRUBINUR NEGATIVE  KETONESUR NEGATIVE  PROTEINUR NEGATIVE  NITRITE NEGATIVE  LEUKOCYTESUR NEGATIVE    Lipid Panel:  No results found for: CHOL, TRIG, HDL, CHOLHDL, VLDL, LDLCALC  HgbA1C:  Lab Results  Component Value Date   HGBA1C 7.3* 07/03/2015    Urine Drug Screen:  No results found for: LABOPIA, COCAINSCRNUR, LABBENZ, AMPHETMU, THCU, LABBARB  Alcohol Level: No results for input(s):  ETH in the last 168 hours.  Other results:  Imaging: Ct Head Wo Contrast  07/03/2015  CLINICAL DATA:  Right-sided weakness. EXAM: CT HEAD WITHOUT CONTRAST TECHNIQUE: Contiguous axial images were obtained from the base of the skull through the vertex without intravenous contrast. COMPARISON:  07/17/2012 FINDINGS: Mild cerebral atrophy is within normal limits for age. There are 5 mm hypodensities in the thalami, more well-defined on the left and both new from the prior CT. There is no evidence of acute large territory cortical infarct, intracranial hemorrhage, mass, midline shift, or extra-axial fluid collection. Periventricular white-matter hypodensities are similar to the prior study and nonspecific but compatible with mild chronic small vessel ischemic disease. A chronic lacunar infarct is noted in the white matter adjacent to the right frontal horn. Prior bilateral cataract extraction is noted. The visualized paranasal sinuses and mastoid air cells are clear. Calcified atherosclerosis is noted at the skullbase. No skull fracture is seen. IMPRESSION: 1. No acute intracranial hemorrhage. 2. New lacunar infarcts in the thalami. These are of indeterminate acuity, although the left-sided infarct is favored to be chronic. 3. Chronic small vessel ischemic disease in the cerebral white matter. Electronically Signed  By: Sebastian Ache M.D.   On: 07/03/2015 13:40   Mr Brain Wo Contrast  07/03/2015  CLINICAL DATA:  RIGHT hemiparesis began 2 days ago. History of diabetes and hypertension. Possible speech impairment. EXAM: MRI HEAD WITHOUT CONTRAST TECHNIQUE: Multiplanar, multiecho pulse sequences of the brain and surrounding structures were obtained without intravenous contrast. COMPARISON:  CT head earlier today.  MRI brain 10/15/2010. FINDINGS: The patient was unable to remain motionless for the exam. Small or subtle lesions could be overlooked. There is an acute subcentimeter infarct affecting the posterior limb  internal capsule, corticospinal tract, and thalamus on the LEFT, probably posterior thalamoperforate artery territory. This likely reflects disease of the smaller penetrating arterioles. No other similar areas of acute infarction. No acute hemorrhage, mass lesion, or extra-axial fluid. Hydrocephalus ex vacuo. Generalized atrophy. Extensive small vessel disease. Scattered microbleeds in the deep nuclei, brainstem, and subcortical white matter, likely sequelae of hypertensive cerebrovascular disease. Current insult is nonhemorrhagic however. Flow voids are maintained in the carotid, basilar, and both vertebral arteries; RIGHT vertebral is dominant. Normal pituitary and cerebellar tonsils. Severe cervical spondylosis with reversal of the normal cervical lordotic curve, disc space narrowing at C3-4 and C4-5, and probable cord compression. Correlate clinically for myelopathy. Chronic sinus disease. RIGHT middle turbinate concha bullosa. No orbital findings. No mastoid fluid of significance. Compared with the CT earlier today, the RIGHT thalamic lacunar infarct is chronic, and the suspected LEFT lesion is acute. Neither were present on the MR from 2012. IMPRESSION: Acute LEFT posterior limb internal capsule, corticospinal tract, and LEFT thalamic subcentimeter infarction without hemorrhage. Atrophy and small vessel disease, with numerous microbleeds representing chronic hypertensive cerebrovascular disease. Severe cervical spondylosis. Electronically Signed   By: Elsie Stain M.D.   On: 07/03/2015 18:20   US Carotid Bilateral  07/03/2015  CLINICAL DATA:  81 year old male with weakness and acute left posterior internal capsule and left thalamic infarct. EXAM: BILATERAL CAROTID DUPLEX ULTRASOUND TECHNIQUE: Wallace Cullens scale imaging, color Doppler and duplex ultrasound were performed of bilateral carotid and vertebral arteries in the neck. COMPARISON:  Ultrasound dated 10/21/2010 FINDINGS: Criteria: Quantification of carotid  stenosis is based on velocity parameters that correlate the residual internal carotid diameter with NASCET-based stenosis levels, using the diameter of the distal internal carotid lumen as the denominator for stenosis measurement. The following velocity measurements were obtained: RIGHT ICA: Peak systolic value 53 cm/sec and end-diastolic value 13 cm/seconds CCA: Peak systolic velocity 54 cm per seconds and end-diastolic value 10 centimeter/seconds SYSTOLIC ICA/CCA RATIO:  1.0 DIASTOLIC ICA/CCA RATIO:  1.6 ECA:  Peak systolic body 56 centimeter/second LEFT ICA: Peak systolic value 72 centimeter/second and end-diastolic value 20 centimeter/second CCA: Peak systolic values 60 centimeter/second and end-diastolic value 8 centimeter/second SYSTOLIC ICA/CCA RATIO:  1.2 DIASTOLIC ICA/CCA RATIO:  2.5 ECA:  36 cm/sec RIGHT CAROTID ARTERY: Mild calcified plaque in the distal right common carotid artery as well as calcified plaque within the carotid bulb and proximal and midportion of the right ICA. RIGHT VERTEBRAL ARTERY:  Antegrade flow LEFT CAROTID ARTERY: Mild noncalcified plaque in distal common carotid artery, calcified plaque in the bulb extending to the proximal portion of the ICA. The left ICA appears slightly tortuous. LEFT VERTEBRAL ARTERY:  Antegrade flow. IMPRESSION: Less than 50% ICA stenosis bilaterally. Electronically Signed   By: Elgie Collard M.D.   On: 07/03/2015 19:19     Assessment/Plan: 81 y.o. male  with a known history of hypertension, diabetes mellitus, hyperlipidemia noted to have right-sided weakness Saturday. Patient had difficulty  walking secondary to weakness on the right side. Patient symptoms slowly improved and he feels little stronger on the right side compared to 2 days ago. CT of the head showed left thalamic stroke in the emergency room. Now back to baseline.  MRI brain L thalamic in L posterior limb internal capsule stroke.  Elevated BP on admission.    Stroke in setting of   Uncontrolled HTN, DM and small vessel dz In the notes does state on ASA and Plavix but he states that he is only on ASA even though there is a slight language barrie  Attempted to call his son Burna Sis, but phone went to voicemail.   [place] no hemodynamic stenosis Transfer out of ICU Pt/ot, D/c planning  07/04/2015, 3:21 PM

## 2015-07-04 NOTE — Progress Notes (Signed)
Upon arrival to unit pt bp was 200's/100.  Prn hydralazine 20 mg iv was given for order parameters (>190/100).  Pt bp improved to sbp 140-150's.  At 2200 pt was given scheduled vasotec, as at that time sbp 140's.  In the last two hours the bp trend has continued to decrease.  Pt remains alert and oriented, no changes in neuro checks (pupils perla, strong strength to all extremities, very minimal slurred speech).  Notified Dr. Anne HahnWillis of pt bp trend, per Dr. Anne HahnWillis continue to monitor bp closely and to notify him if bp continues to decrease (current bp 93/58).

## 2015-07-04 NOTE — Progress Notes (Signed)
[facility] - [place] at [facility]   PATIENT NAME: Christopher [name]    MR#:  [id number]  DATE OF BIRTH:  09-03-1925  SUBJECTIVE:  Admitted for right-sided weakness and found to have acute stroke. Blood pressure was high on admission now it is better. Has slight slurred speech.   CHIEF COMPLAINT:   Chief Complaint  Patient presents with  . Weakness    REVIEW OF SYSTEMS:    Review of Systems  Constitutional: Negative for fever and chills.  HENT: Negative for hearing loss.   Eyes: Negative for blurred vision, double vision and photophobia.  Respiratory: Negative for cough, hemoptysis and shortness of breath.   Cardiovascular: Negative for palpitations, orthopnea and leg swelling.  Gastrointestinal: Negative for vomiting, abdominal pain and diarrhea.  Genitourinary: Negative for dysuria and urgency.  Musculoskeletal: Negative for myalgias and neck pain.  Skin: Negative for rash.  Neurological: Positive for focal weakness. Negative for dizziness, seizures, weakness and headaches.  Psychiatric/Behavioral: Negative for memory loss. The patient does not have insomnia.     Nutrition:  Tolerating Diet: Tolerating PT:      DRUG ALLERGIES:   Allergies  Allergen Reactions  . Hydrochlorothiazide     Unknown reaction    VITALS:  Blood pressure 169/89, pulse 84, temperature 97.4 F (36.3 C), temperature source Oral, resp. rate 22, height  (1.651 m), weight 68 kg (149 lb 14.6 oz), SpO2 97 %.  PHYSICAL EXAMINATION:   Physical Exam  GENERAL:  81 y.o.-year-old patient lying in the bed with no acute distress.  EYES: Pupils equal, round, reactive to light and accommodation. No scleral icterus. Extraocular muscles intact.  HEENT: Head atraumatic, normocephalic. Oropharynx and nasopharynx clear.  NECK:  Supple, no jugular venous distention. No thyroid enlargement, no tenderness.  LUNGS: Normal breath sounds bilaterally, no wheezing, rales,rhonchi or  crepitation. No use of accessory muscles of respiration.  CARDIOVASCULAR: S1, S2 normal. No murmurs, rubs, or gallops.  ABDOMEN: Soft, nontender, nondistended. Bowel sounds present. No organomegaly or mass.  EXTREMITIES: No pedal edema, cyanosis, or clubbing.  NEUROLOGIC: Cranial nerves II through XII are intact. Muscle strength 5/5 in all extremities. Sensation intact. Gait not checked.  PSYCHIATRIC: The patient is alert and oriented x 3.  SKIN: No obvious rash, lesion, or ulcer.    LABORATORY PANEL:   CBC  Recent Labs Lab 07/03/15 2014  WBC 7.9  HGB 14.3  HCT 42.6  PLT 206   ------------------------------------------------------------------------------------------------------------------  Chemistries   Recent Labs Lab 07/03/15 1422 07/03/15 2014  NA 137  --   K 3.8  --   CL 101  --   CO2 28  --   GLUCOSE 136*  --   BUN 23*  --   CREATININE 1.04 0.96  CALCIUM 9.4  --   AST 25  --   ALT 19  --   ALKPHOS 65  --   BILITOT 0.6  --    ------------------------------------------------------------------------------------------------------------------  Cardiac Enzymes  Recent Labs Lab 07/03/15 1422  TROPONINI <0.03   ------------------------------------------------------------------------------------------------------------------  RADIOLOGY:  Ct Head Wo Contrast  07/03/2015  CLINICAL DATA:  Right-sided weakness. EXAM: CT HEAD WITHOUT CONTRAST TECHNIQUE: Contiguous axial images were obtained from the base of the skull through the vertex without intravenous contrast. COMPARISON:  07/17/2012 FINDINGS: Mild cerebral atrophy is within normal limits for age. There are 5 mm hypodensities in the thalami, more well-defined on the left and both new from the prior CT. There is no evidence of acute large territory  cortical infarct, intracranial hemorrhage, mass, midline shift, or extra-axial fluid collection. Periventricular white-matter hypodensities are similar to the prior  study and nonspecific but compatible with mild chronic small vessel ischemic disease. A chronic lacunar infarct is noted in the white matter adjacent to the right frontal horn. Prior bilateral cataract extraction is noted. The visualized paranasal sinuses and mastoid air cells are clear. Calcified atherosclerosis is noted at the skullbase. No skull fracture is seen. IMPRESSION: 1. No acute intracranial hemorrhage. 2. New lacunar infarcts in the thalami. These are of indeterminate acuity, although the left-sided infarct is favored to be chronic. 3. Chronic small vessel ischemic disease in the cerebral white matter. Electronically Signed   By: Sebastian AcheAllen  Grady M.D.   On: 07/03/2015 13:40   Mr Brain Wo Contrast  07/03/2015  CLINICAL DATA:  RIGHT hemiparesis began 2 days ago. History of diabetes and hypertension. Possible speech impairment. EXAM: MRI HEAD WITHOUT CONTRAST TECHNIQUE: Multiplanar, multiecho pulse sequences of the brain and surrounding structures were obtained without intravenous contrast. COMPARISON:  CT head earlier today.  MRI brain 10/15/2010. FINDINGS: The patient was unable to remain motionless for the exam. Small or subtle lesions could be overlooked. There is an acute subcentimeter infarct affecting the posterior limb internal capsule, corticospinal tract, and thalamus on the LEFT, probably posterior thalamoperforate artery territory. This likely reflects disease of the smaller penetrating arterioles. No other similar areas of acute infarction. No acute hemorrhage, mass lesion, or extra-axial fluid. Hydrocephalus ex vacuo. Generalized atrophy. Extensive small vessel disease. Scattered microbleeds in the deep nuclei, brainstem, and subcortical white matter, likely sequelae of hypertensive cerebrovascular disease. Current insult is nonhemorrhagic however. Flow voids are maintained in the carotid, basilar, and both vertebral arteries; RIGHT vertebral is dominant. Normal pituitary and cerebellar  tonsils. Severe cervical spondylosis with reversal of the normal cervical lordotic curve, disc space narrowing at C3-4 and C4-5, and probable cord compression. Correlate clinically for myelopathy. Chronic sinus disease. RIGHT middle turbinate concha bullosa. No orbital findings. No mastoid fluid of significance. Compared with the CT earlier today, the RIGHT thalamic lacunar infarct is chronic, and the suspected LEFT lesion is acute. Neither were present on the MR from 2012. IMPRESSION: Acute LEFT posterior limb internal capsule, corticospinal tract, and LEFT thalamic subcentimeter infarction without hemorrhage. Atrophy and small vessel disease, with numerous microbleeds representing chronic hypertensive cerebrovascular disease. Severe cervical spondylosis. Electronically Signed   By: Elsie StainJohn T Curnes M.D.   On: 07/03/2015 18:20   [place] Carotid Bilateral  07/03/2015  CLINICAL DATA:  81 year old male with weakness and acute left posterior internal capsule and left thalamic infarct. EXAM: BILATERAL CAROTID DUPLEX ULTRASOUND TECHNIQUE: Wallace CullensGray scale imaging, color Doppler and duplex ultrasound were performed of bilateral carotid and vertebral arteries in the neck. COMPARISON:  Ultrasound dated 10/21/2010 FINDINGS: Criteria: Quantification of carotid stenosis is based on velocity parameters that correlate the residual internal carotid diameter with NASCET-based stenosis levels, using the diameter of the distal internal carotid lumen as the denominator for stenosis measurement. The following velocity measurements were obtained: RIGHT ICA: Peak systolic value 53 cm/sec and end-diastolic value 13 cm/seconds CCA: Peak systolic velocity 54 cm per seconds and end-diastolic value 10 centimeter/seconds SYSTOLIC ICA/CCA RATIO:  1.0 DIASTOLIC ICA/CCA RATIO:  1.6 ECA:  Peak systolic body 56 centimeter/second LEFT ICA: Peak systolic value 72 centimeter/second and end-diastolic value 20 centimeter/second CCA: Peak systolic values 60  centimeter/second and end-diastolic value 8 centimeter/second SYSTOLIC ICA/CCA RATIO:  1.2 DIASTOLIC ICA/CCA RATIO:  2.5 ECA:  36 cm/sec RIGHT CAROTID  ARTERY: Mild calcified plaque in the distal right common carotid artery as well as calcified plaque within the carotid bulb and proximal and midportion of the right ICA. RIGHT VERTEBRAL ARTERY:  Antegrade flow LEFT CAROTID ARTERY: Mild noncalcified plaque in distal common carotid artery, calcified plaque in the bulb extending to the proximal portion of the ICA. The left ICA appears slightly tortuous. LEFT VERTEBRAL ARTERY:  Antegrade flow. IMPRESSION: Less than 50% ICA stenosis bilaterally. Electronically Signed   By: Elgie Collard M.D.   On: 07/03/2015 19:19     ASSESSMENT AND PLAN:   Active Problems:   Acute CVA (cerebrovascular accident) (HCC)   1,.Acute  CVA; it evidence of acute left internal capsule, thalamic,corticospinal tract; patient has very slight weakness on the right lower extremity. Continue aspirin, Plavix, statin, physical therapy evaluation, neurology evaluation. Patient may be able to go home tomorrow. Carotid no significant stenosis.follow echo. 2.Malignant hypertension controlled. Restarted home medications.S BP goal no less than 160  Due to acute stroke 3. Diabetes mellitus type 2.;continue home meds  Explained to patient and patient's son multiple times for the diagnosis of acute stroke and rick factors of hypertension, diabetes.   All the records are reviewed and case discussed with Care Management/Social Workerr. Management plans discussed with the patient, family and they are in agreement.  CODE STATUS: full  TOTAL TIME TAKING CARE OF THIS PATIENT: 35 minutes.   POSSIBLE D/C IN 1-2 DAYS, DEPENDING ON CLINICAL CONDITION.   Katha Hamming M.D on 07/04/2015 at 3:05 PM  Between 7am to 6pm - Pager - [phone]  After 6pm go to [email or web address] - password [email or web address] [facility]  [place] [facility]  Office   [phone]  CC: Primary care physician; No primary care provider on file.

## 2015-07-05 DIAGNOSIS — I639 Cerebral infarction, unspecified: Secondary | ICD-10-CM | POA: Diagnosis not present

## 2015-07-05 LAB — LIPID PANEL
CHOLESTEROL: 170 mg/dL (ref 0–200)
HDL: 37 mg/dL — ABNORMAL LOW (ref >40)
LDL Cholesterol: 99 mg/dL (ref 0–99)
Total CHOL/HDL Ratio: 4.6 RATIO
Triglycerides: 172 mg/dL — ABNORMAL HIGH (ref <150)
VLDL: 34 mg/dL (ref 0–40)

## 2015-07-05 MED ORDER — CLOPIDOGREL BISULFATE 75 MG PO TABS: 75.0000 mg | ORAL_TABLET | Freq: Every day | ORAL | Status: DC

## 2015-07-05 MED ORDER — PRAVASTATIN SODIUM 40 MG PO TABS: 40.0000 mg | ORAL_TABLET | Freq: Every day | ORAL | Status: AC

## 2015-07-05 MED ORDER — ASPIRIN 81 MG PO CHEW: 81.0000 mg | CHEWABLE_TABLET | Freq: Every day | ORAL | Status: DC

## 2015-07-05 NOTE — Care Management Important Message (Signed)
Important Message  Patient Details  Name: Christopher [name] MRN: [id number] Date of Birth: 1926-03-07   Medicare Important Message Given:  Yes    [name] 07/05/2015, 10:24 AM

## 2015-07-05 NOTE — Plan of Care (Signed)
Problem: Education: Goal: Knowledge of disease or condition will improve Outcome: Progressing NIH 2  Ambulates with assist. No complaints of pain. Son at bedside.

## 2015-07-05 NOTE — Care Management (Signed)
Admitted to [facility] with the diagnosis of CVA. Lives with son, Burna SisSaif [phone]). History of stroke 3 years ago. Son says that they were followed by Home Health at that time and also went to outpatient therapy. Prefers to go to outpatient therapy. Explained that the primary care physician would need to order outpatient therapy. Son arranged appointment with Dr. Letta PateAycock for next Tuesday 1:30pm. Requested rolling walker. Telephone call to Dr. Teressa SenterKonidenia for order for walker. Telephone call to Will Dareen PianoAnderson, [facility] representative for equipment.  Son will transport. Gwenette GreetBrenda S Yalexa Blust RN MSN CCM Care Management [phone]

## 2015-07-05 NOTE — Progress Notes (Signed)
Physical Therapy Treatment Patient Details Name: Christopher [name] MRN: [id number] DOB: 24-Sep-1925 Today's Date: 07/05/2015    History of Present Illness Pt is an 81 y.o. male presenting to hospital with R sided weakness since Saturday with difficulty walking.  MRI showing acute L posterior limb internal capsule, corticalspinal tract, and L thalamic subcentimeter infarction without hemorrhage; also numerous microbleeds representing chronic hypertensive cerebrovascular disease; also severe cervical spondylosis.  PMH includes htn, DM, HLD, TIA, ataxia.    PT Comments    Pt still demonstrating intermittent loss of balance to R with SBQC use so trialed pt with RW.  Pt appearing steady with transfers and gait with RW without any loss of balance; pt does demonstrate decreased stance time R LE with distance (R LE appears to fatigue with activity).  Pt's son present entire session and pt and pt's son educated on need for use of RW and 24/7 assist with all functional mobility for safety.  Pt's son declining HHPT but agreeable to OP PT (CM aware); pt and pt's son educated to follow up with PCP to make sure OP PT is set-up.  CM notified of need for RW for safe discharge.  Pt and pt's son report no further questions/concerns for discharge home.   Follow Up Recommendations  Outpatient PT;Supervision/Assistance - 24 hour (Pt's son declining HHPT and requesting OP PT instead; assist for all functional mobility)     Equipment Recommendations  Rolling walker with 5" wheels    Recommendations for Other Services       Precautions / Restrictions Precautions Precautions: Fall Restrictions Weight Bearing Restrictions: No    Mobility  Bed Mobility Overal bed mobility: Modified Independent Bed Mobility: Supine to Sit;Sit to Supine     Supine to sit: Modified independent (Device/Increase time) Sit to supine: Modified independent (Device/Increase time)   General bed mobility comments: mild increase time to  perform  Transfers Overall transfer level: Needs assistance Equipment used: Rolling walker (2 wheeled) Transfers: Sit to/from Stand Sit to Stand: Min guard         General transfer comment: pt pushing LE's against back of bed to stand with [facility] requiring min assist d/t loss of balance backwards and to R; with RW pt steady and no loss of balance noted  Ambulation/Gait Ambulation/Gait assistance: Min guard Ambulation Distance (Feet):  (200 feet; 130 feet) Assistive device: Rolling walker (2 wheeled)     Gait velocity interpretation: at or above normal speed for age/gender General Gait Details: trialed [facility] but pt with intermittent loss of balance to R (x20 feet) requiring min assist to steady so switched to RW for rest of ambulation; mild increased R lateral sway compared to L but no loss of balance with use of RW; with distance pt with decreased stance time R LE (R LE appearing to fatigue)   Stairs Stairs: Yes Stairs assistance: Min guard Stair Management: One rail Left;Forwards Number of Stairs: 10 General stair comments: pt initially alternating with LE's up stairs but with cueing switched to step to pattern d/t mild R knee buckling after 6 stairs; step to pattern down stairs; B UE's on railing  Wheelchair Mobility    Modified Rankin (Stroke Patients Only)       Balance Overall balance assessment: Needs assistance Sitting-balance support: No upper extremity supported;Feet supported Sitting balance-Leahy Scale: Good     Standing balance support: Bilateral upper extremity supported (on RW) Standing balance-Leahy Scale: Good Standing balance comment: static  Cognition Arousal/Alertness: Awake/alert Behavior During Therapy: WFL for tasks assessed/performed Overall Cognitive Status: Within Functional Limits for tasks assessed                      Exercises      General Comments   Nursing cleared pt for participation in physical  therapy.  Pt agreeable to PT session.  Pt's son present entire session.      Pertinent Vitals/Pain Pain Assessment: No/denies pain  Vitals stable and WFL throughout treatment session.    Home Living                      Prior Function            PT Goals (current goals can now be found in the care plan section) Acute Rehab PT Goals Patient Stated Goal: to go home PT Goal Formulation: With patient Time For Goal Achievement: 07/18/15 Potential to Achieve Goals: Good Progress towards PT goals: Progressing toward goals    Frequency  7X/week    PT Plan Discharge plan needs to be updated;Equipment recommendations need to be updated    Co-evaluation             End of Session Equipment Utilized During Treatment: Gait belt Activity Tolerance: Patient tolerated treatment well Patient left: in bed;with call bell/phone within reach;with bed alarm set;with family/visitor present     Time: 1058-1130 PT Time Calculation (min) (ACUTE ONLY): 32 min  Charges:  $Gait Training: 23-37 mins                    G CodesHendricks Limes:      Serria Sloma 07/05/2015, 11:45 AM Hendricks LimesEmily Freedom Lopezperez, PT [phone]

## 2015-07-05 NOTE — Discharge Summary (Signed)
Christopher [name], is a 81 y.o. male  DOB 13-Mar-1926  MRN [id number].  Admission date:  07/03/2015  Admitting Physician  Katha Hamming, MD  Discharge Date:  07/05/2015   Primary MD  No primary care provider on file.  Recommendations for primary care physician for things to follow:  Follow-up with primary doctor next week..   Admission Diagnosis  Weakness [R53.1] Cerebral infarction due to unspecified mechanism [I63.9]   Discharge Diagnosis  Weakness [R53.1] Cerebral infarction due to unspecified mechanism [I63.9]    Active Problems:   Acute CVA (cerebrovascular accident) [facility])      Past Medical History  Diagnosis Date  . TIA (transient ischemic attack)   . Diabetes mellitus without complication (HCC)   . Hypertension   . Dyslipidemia   . Arthritis   . Ataxia     History reviewed. No pertinent past surgical history.     History of present illness and  Hospital Course:     Kindly see H&P for history of present illness and admission details, please review complete Labs, Consult reports and Test reports for all details in brief  HPI  from the history and physical done on the day of admission  81 year old male patient with history of hypertension, diabetes mellitus, hyperlipidemia noted to have right-sided weakness and on Saturday. Came to the ER on Tuesday found her to have acute stroke in the left thalamus. So he is admitted for acute stroke. Patient did have ambulatory difficulties on Saturday secondary to right-sided weakness. But right-sided weakness improved. But because of the concern for stroke he came to the hospital.  Hospital Course  1 acute CVA; admitted for acute CVA with right-sided weakness: Initially admitted to ICU secondary to malignant hypertension with systolic blood 200/110. Patient never  required IV  Nitro drip.It improved with IV when necessary hydralazine, her medications. Right-sided weakness improved. MRI of the brain showed acute infarct in the left thalamus, left internal capsule, left corticospinal tract. Patient is seen by neurology Dr. Nona Dell, physical therapy. Patient started on aspirin, Plavix. Also continued on statins. Physical therapy recommended home physical therapy but the son wanted the outpatient physical therapy. So we have arranged Follow-up appointment with primary care doctor to arrange for outpatient physical therapy prescription. Echo Cardiogram showed EF more than 65%.  2 hypertension: Malignant hypertension systolic blood. More than 200: The patient is monitored in ICU briefly. It improved. Her goal of the blood pressure systolic is never less than 160. Due to acute stroke. Advised to continue metoprolol, enalapril. #3. diabetes mellitus type 2: Patient is on metformin, 500. milligrams by mouth twice a day, glipizide XL 5 mg by mouth daily advised to continue that. 4.spoke to pt and son at length and spoke to both of [place] in [place] and [place].spent more than 30 min in explaining diagnosis and treatment plan.   Discharge Condition: stable   Follow UP  Follow-up Information    Follow up with Karie Fetch A, MD On 07/10/2015.   Specialty:  Family Medicine   Why:  at 1:20   Contact information:   [address] [place] [address] [phone]         Discharge Instructions  and  Discharge Medications     Discharge Instructions    Face-to-face encounter (required for Medicare/Medicaid patients)    Complete by:  As directed   I Samora Jernberg certify that this patient is under my care and that I, or a nurse practitioner or physician's assistant working  with me, had a face-to-face encounter that meets the physician face-to-face encounter requirements with this patient on 07/05/2015. The encounter with the patient was in whole, or in  part for the following medical condition Acute stroke htn dmii  The encounter with the patient was in whole, or in part, for the following medical condition, which is the primary reason for home health care:  whole  I certify that, based on my findings, the following services are medically necessary home health services:  Physical therapy  Reason for Medically Necessary Home Health Services:  Therapy- Investment banker, operational, Patent examiner  My clinical findings support the need for the above services:  Unsafe ambulation due to balance issues  Further, I certify that my clinical findings support that this patient is homebound due to:  Unable to leave home safely without assistance            Medication List    STOP taking these medications        aspirin EC 325 MG tablet  Replaced by:  aspirin 81 MG chewable tablet      TAKE these medications        aspirin 81 MG chewable tablet  Chew 1 tablet (81 mg total) by mouth daily.     clopidogrel 75 MG tablet  Commonly known as:  PLAVIX  Take 1 tablet (75 mg total) by mouth daily.     enalapril 20 MG tablet  Commonly known as:  VASOTEC  Take 1 tablet by mouth 2 (two) times daily.     glipiZIDE 5 MG 24 hr tablet  Commonly known as:  GLUCOTROL XL  Take 1 tablet by mouth daily.     meloxicam 7.5 MG tablet  Commonly known as:  MOBIC  Take 1 tablet by mouth daily.     metFORMIN 500 MG tablet  Commonly known as:  GLUCOPHAGE  Take 2 tablets by mouth 2 (two) times daily.     metoprolol succinate 50 MG 24 hr tablet  Commonly known as:  TOPROL-XL  Take 1 tablet by mouth daily.     MULTI-VITAMINS Tabs  Take 1 tablet by mouth daily.     pravastatin 40 MG tablet  Commonly known as:  PRAVACHOL  Take 1 tablet (40 mg total) by mouth daily.     triamcinolone ointment 0.1 %  Commonly known as:  KENALOG  Apply 1 application topically 2 (two) times daily.     vitamin B-12 1000 MCG tablet  Commonly known as:   CYANOCOBALAMIN  Take 1,000 mcg by mouth daily.          Diet and Activity recommendation: See Discharge Instructions above   Consults obtained - neurology   Major procedures and Radiology Reports - PLEASE review detailed and final reports for all details, in brief -     Ct Head Wo Contrast  07/03/2015  CLINICAL DATA:  Right-sided weakness. EXAM: CT HEAD WITHOUT CONTRAST TECHNIQUE: Contiguous axial images were obtained from the base of the skull through the vertex without intravenous contrast. COMPARISON:  07/17/2012 FINDINGS: Mild cerebral atrophy is within normal limits for age. There are 5 mm hypodensities in the thalami, more well-defined on the left and both new from the prior CT. There is no evidence of acute large territory cortical infarct, intracranial hemorrhage, mass, midline shift, or extra-axial fluid collection. Periventricular white-matter hypodensities are similar to the prior study and nonspecific but compatible with mild chronic small vessel ischemic disease. A chronic lacunar infarct is  noted in the white matter adjacent to the right frontal horn. Prior bilateral cataract extraction is noted. The visualized paranasal sinuses and mastoid air cells are clear. Calcified atherosclerosis is noted at the skullbase. No skull fracture is seen. IMPRESSION: 1. No acute intracranial hemorrhage. 2. New lacunar infarcts in the thalami. These are of indeterminate acuity, although the left-sided infarct is favored to be chronic. 3. Chronic small vessel ischemic disease in the cerebral white matter. Electronically Signed   By: Sebastian AcheAllen  Grady M.D.   On: 07/03/2015 13:40   Mr Brain Wo Contrast  07/03/2015  CLINICAL DATA:  RIGHT hemiparesis began 2 days ago. History of diabetes and hypertension. Possible speech impairment. EXAM: MRI HEAD WITHOUT CONTRAST TECHNIQUE: Multiplanar, multiecho pulse sequences of the brain and surrounding structures were obtained without intravenous contrast.  COMPARISON:  CT head earlier today.  MRI brain 10/15/2010. FINDINGS: The patient was unable to remain motionless for the exam. Small or subtle lesions could be overlooked. There is an acute subcentimeter infarct affecting the posterior limb internal capsule, corticospinal tract, and thalamus on the LEFT, probably posterior thalamoperforate artery territory. This likely reflects disease of the smaller penetrating arterioles. No other similar areas of acute infarction. No acute hemorrhage, mass lesion, or extra-axial fluid. Hydrocephalus ex vacuo. Generalized atrophy. Extensive small vessel disease. Scattered microbleeds in the deep nuclei, brainstem, and subcortical white matter, likely sequelae of hypertensive cerebrovascular disease. Current insult is nonhemorrhagic however. Flow voids are maintained in the carotid, basilar, and both vertebral arteries; RIGHT vertebral is dominant. Normal pituitary and cerebellar tonsils. Severe cervical spondylosis with reversal of the normal cervical lordotic curve, disc space narrowing at C3-4 and C4-5, and probable cord compression. Correlate clinically for myelopathy. Chronic sinus disease. RIGHT middle turbinate concha bullosa. No orbital findings. No mastoid fluid of significance. Compared with the CT earlier today, the RIGHT thalamic lacunar infarct is chronic, and the suspected LEFT lesion is acute. Neither were present on the MR from 2012. IMPRESSION: Acute LEFT posterior limb internal capsule, corticospinal tract, and LEFT thalamic subcentimeter infarction without hemorrhage. Atrophy and small vessel disease, with numerous microbleeds representing chronic hypertensive cerebrovascular disease. Severe cervical spondylosis. Electronically Signed   By: Elsie StainJohn T Curnes M.D.   On: 07/03/2015 18:20   [place] Carotid Bilateral  07/03/2015  CLINICAL DATA:  81 year old male with weakness and acute left posterior internal capsule and left thalamic infarct. EXAM: BILATERAL CAROTID  DUPLEX ULTRASOUND TECHNIQUE: Wallace CullensGray scale imaging, color Doppler and duplex ultrasound were performed of bilateral carotid and vertebral arteries in the neck. COMPARISON:  Ultrasound dated 10/21/2010 FINDINGS: Criteria: Quantification of carotid stenosis is based on velocity parameters that correlate the residual internal carotid diameter with NASCET-based stenosis levels, using the diameter of the distal internal carotid lumen as the denominator for stenosis measurement. The following velocity measurements were obtained: RIGHT ICA: Peak systolic value 53 cm/sec and end-diastolic value 13 cm/seconds CCA: Peak systolic velocity 54 cm per seconds and end-diastolic value 10 centimeter/seconds SYSTOLIC ICA/CCA RATIO:  1.0 DIASTOLIC ICA/CCA RATIO:  1.6 ECA:  Peak systolic body 56 centimeter/second LEFT ICA: Peak systolic value 72 centimeter/second and end-diastolic value 20 centimeter/second CCA: Peak systolic values 60 centimeter/second and end-diastolic value 8 centimeter/second SYSTOLIC ICA/CCA RATIO:  1.2 DIASTOLIC ICA/CCA RATIO:  2.5 ECA:  36 cm/sec RIGHT CAROTID ARTERY: Mild calcified plaque in the distal right common carotid artery as well as calcified plaque within the carotid bulb and proximal and midportion of the right ICA. RIGHT VERTEBRAL ARTERY:  Antegrade flow LEFT CAROTID ARTERY:  Mild noncalcified plaque in distal common carotid artery, calcified plaque in the bulb extending to the proximal portion of the ICA. The left ICA appears slightly tortuous. LEFT VERTEBRAL ARTERY:  Antegrade flow. IMPRESSION: Less than 50% ICA stenosis bilaterally. Electronically Signed   By: Elgie Collard M.D.   On: 07/03/2015 19:19    Micro Results    Recent Results (from the past 240 hour(s))  MRSA PCR Screening     Status: None   Collection Time: 07/03/15  7:50 PM  Result Value Ref Range Status   MRSA by PCR NEGATIVE NEGATIVE Final    Comment:        The GeneXpert MRSA Assay (FDA approved for NASAL  specimens only), is one component of a comprehensive MRSA colonization surveillance program. It is not intended to diagnose MRSA infection nor to guide or monitor treatment for MRSA infections.        Today   Subjective:   Christopher [name] today has no headache,no chest abdominal pain,no new weakness tingling or numbness, feels much better wants to go home today.   Objective:   Blood pressure 143/90, pulse 84, temperature 98.4 F (36.9 C), temperature source Oral, resp. rate 20, height 5\' 5"  (1.651 m), weight 68 kg (149 lb 14.6 oz), SpO2 97 %.   Intake/Output Summary (Last 24 hours) at 07/05/15 1242 Last data filed at 07/05/15 0800  Gross per 24 hour  Intake    240 ml  Output    250 ml  Net    -10 ml    Exam Awake Alert, Oriented x 3, No new F.N deficits, Normal affect [place].AT,PERRAL Supple Neck,No JVD, No cervical lymphadenopathy appriciated.  Symmetrical Chest wall movement, Good air movement bilaterally, CTAB RRR,No Gallops,Rubs or new Murmurs, No Parasternal Heave +ve B.Sounds, Abd Soft, Non tender, No organomegaly appriciated, No rebound -guarding or rigidity. No Cyanosis, Clubbing or edema, No new Rash or bruise  Data Review   CBC w Diff: Lab Results  Component Value Date   WBC 7.9 07/03/2015   WBC 7.6 07/17/2012   HGB 14.3 07/03/2015   HGB 13.1 07/17/2012   HCT 42.6 07/03/2015   HCT 37.9* 07/17/2012   PLT 206 07/03/2015   PLT 230 07/17/2012    CMP: Lab Results  Component Value Date   NA 137 07/03/2015   NA 137 07/17/2012   K 3.8 07/03/2015   K 3.8 07/17/2012   CL 101 07/03/2015   CL 103 07/17/2012   CO2 28 07/03/2015   CO2 27 07/17/2012   BUN 23* 07/03/2015   BUN 26* 07/17/2012   CREATININE 0.96 07/03/2015   CREATININE 1.26 07/17/2012   PROT 7.4 07/03/2015   PROT 7.8 07/17/2012   ALBUMIN 4.2 07/03/2015   ALBUMIN 3.8 07/17/2012   BILITOT 0.6 07/03/2015   BILITOT 0.3 07/17/2012   ALKPHOS 65 07/03/2015   ALKPHOS 84 07/17/2012   AST 25  07/03/2015   AST 24 07/17/2012   ALT 19 07/03/2015   ALT 23 07/17/2012  .   Total Time in preparing paper work, data evaluation and todays exam - 35 minutes  Kerry Odonohue M.D on 07/05/2015 at 12:42 PM    Note: This dictation was prepared with Dragon dictation along with smaller phrase technology. Any transcriptional errors that result from this process are unintentional.

## 2015-07-05 NOTE — Progress Notes (Signed)
Pt and son given d/c instructions r/t activity, diet, followup care and medications, when to call md and stroke prevention. Voiced understanding, pt and son given prescriptions x 3. Pt d/c home via wheelchair escorted by staff and family

## 2015-07-09 LAB — GLUCOSE, CAPILLARY: Glucose-Capillary: 80 mg/dL (ref 65–99)

## 2015-07-16 ENCOUNTER — Ambulatory Visit: Payer: Medicare Other | Attending: Family Medicine

## 2015-07-16 DIAGNOSIS — Z9181 History of falling: Secondary | ICD-10-CM | POA: Insufficient documentation

## 2015-07-16 DIAGNOSIS — R269 Unspecified abnormalities of gait and mobility: Secondary | ICD-10-CM | POA: Insufficient documentation

## 2015-07-18 ENCOUNTER — Ambulatory Visit: Payer: Medicare Other

## 2015-07-23 ENCOUNTER — Encounter: Payer: Self-pay | Admitting: Physical Therapy

## 2015-07-23 ENCOUNTER — Ambulatory Visit: Payer: Medicare Other | Admitting: Physical Therapy

## 2015-07-23 DIAGNOSIS — R269 Unspecified abnormalities of gait and mobility: Secondary | ICD-10-CM | POA: Diagnosis present

## 2015-07-23 DIAGNOSIS — Z9181 History of falling: Secondary | ICD-10-CM

## 2015-07-23 NOTE — Patient Instructions (Signed)
SIT TO STAND: No Device   Sit with feet shoulder-width apart, on floor.(Make sure that you are in a chair that won't move like a chair against a wall or couch etc) Lean chest forward, raise hips up from surface. Straighten hips and knees. Weight bear equally on left and right sides. 10___ reps per set, _2__ sets per day, _5__ days per week Place left leg closer to sitting surface.  Copyright  VHI. All rights reserved.  Backward Walking   Walk backward, toes of each foot coming down first. Take long, even strides. Make sure you have a clear pathway with no obstructions when you do this. Stand beside counter and walk backward  And then walk forward doing opposite directions; repeat 10 laps 2x a day at least 5 days a week.  Copyright  VHI. All rights reserved.  Tandem Walking   Stand beside kitchen sink and place one foot in front of the other, lift your hand and try to hold position for 10 sec. Repeat with other foot in front; Repeat 5 reps with each foot in front 5 days a week.Balance: Unilateral   Attempt to balance on left leg, eyes open. Hold _5-10___ seconds.Start with holding onto counter and if you get your balance you can try to let go of counter. Repeat __5__ times per set. Do __1__ sets per session. Do __1__ sessions per day. Keep eyes open:   http://orth.exer.us/29   Copyright  VHI. All rights reserved.     

## 2015-07-23 NOTE — Therapy (Signed)
[facility] [address] [place], [place], [address] Phone: [phone]   Fax:  [phone]  Physical Therapy Evaluation  Patient Details  Name: Christopher [name] MRN: [id number] Date of Birth: May 27, 1926 Referring Provider: Lynne LeaderAycock N (MD), primary physician  Encounter Date: 07/23/2015      PT End of Session - 07/23/15 1320    Visit Number 1   Number of Visits 9   Date for PT Re-Evaluation 08/20/15   Authorization Type gcode 1   Authorization Time Period 10   PT Start Time 0930   PT Stop Time 1030   PT Time Calculation (min) 60 min   Equipment Utilized During Treatment Gait belt   Activity Tolerance Patient tolerated treatment well   Behavior [facility] Therapy [facility] for tasks assessed/performed      Past Medical History  Diagnosis Date  . TIA (transient ischemic attack)   . Dyslipidemia   . Arthritis   . Ataxia   . Diabetes mellitus without complication (HCC)     controlled  . Hypertension     somewhat controlled    History reviewed. No pertinent past surgical history.  There were no vitals filed for this visit.  Visit Diagnosis:  Abnormality of gait - Plan: PT plan of care cert/re-cert  History of fall - Plan: PT plan of care cert/re-cert      Subjective Assessment - 07/23/15 0939    Subjective 82 yo Male s/p CVA 2 weeks ago with right sided weakness. He was admitted to hospital with hypertension and they increased his BP meds for increased control; He is supposed to follow up with cardiologist regarding BP meds; He denies any numbness/tingling; He presents to therapy without AD, walking with increased shuffled steps; uses SPC sometimes when outside of home;    Patient is accompained by: Family member   Pertinent History Personal factors affecting rehab: depression (somewhat controlled), uncontrolled HTN, has stairs inside home; increased risk for falls;    How long can you stand comfortably? 10-15 min   How long  can you walk comfortably? >500 feet   Diagnostic tests undergoing cardiac tests;    Patient Stated Goals "I want walking to get better, get stronger"   Currently in Pain? No/denies            [facility] - 07/23/15 0001    Assessment   Medical Diagnosis s/p CVA   Referring Provider Lynne LeaderAycock N (MD), primary physician   Onset Date/Surgical Date 07/08/15   Hand Dominance Right   Next MD Visit 07/24/15   Prior Therapy patient has had inpatient rehab following stroke; He denies any outpatient PT for this condition;    Precautions   Precautions Fall   Restrictions   Weight Bearing Restrictions No   Balance Screen   Has the patient fallen in the past 6 months Yes   How many times? 1  fell when had stroke   Has the patient had a decrease in activity level because of a fear of falling?  No   Is the patient reluctant to leave their home because of a fear of falling?  No   Home Environment   Additional Comments Lives with wife; lives in 2 story home; has ramp and 3 stairs to enter house with rails; inside home has 6 steps to get to 2nd floor where bedroom is with B rails;    Prior Function   Level of Independence Independent with basic ADLs;Independent with gait;Independent with  transfers  reports using SPC sometimes when outside the house;    Vocation Retired   Leisure watches sports   Cognition   Overall Cognitive Status Within Functional Limits for tasks assessed   Observation/Other Assessments   Skin Integrity intact by gross assessment;    Sensation   Light Touch Appears Intact   Coordination   Gross Motor Movements are Fluid and Coordinated Yes   Posture/Postural Control   Posture Comments demonstrates erect sitting posture, unsupported   AROM   Overall AROM Comments BUE and BLE AROM is [facility]   Strength   Overall Strength Comments BUE gross strength is WNL; Grip strength: R: 60#, L: 55#; BLE gross strength is [facility] grossly 4+/5   Palpation   Palpation comment denies any  tenderness to palpation; no sulcus sign noted;    Transfers   Comments able to transfer sit<>Stand without HHA   Ambulation/Gait   Gait Comments ambulates with reciprocal gait pattern, slower gait speed, erect posture, increased foot drag;    6 Minute Walk- Baseline   BP (mmHg) 178/83 mmHg   HR (bpm) 77   02 Sat (%RA) 100 %   6 Minute walk- Post Test   BP (mmHg) 177/87 mmHg   HR (bpm) 88   02 Sat (%RA) 100 %   6 minute walk test results    Aerobic Endurance Distance Walked 675   Endurance additional comments without AD, close supervision; demonstrates increased toe drag and shorter step length;    Standardized Balance Assessment   Five times sit to stand comments  12 sec without HHA (low fall risk)   10 Meter Walk 0.625 m/s without AD (limited community ambulator, increased risk for falls      TREATMENT: Initiated HEP: Forward/backward walking unsupported 5 feet x3 laps; Tandem stance 5 sec hold each foot in front; SLS 5 sec hold each LE; Sit<>Stand without HHA x10;  Patient required min VCs for balance stability, including to increase trunk control for less loss of balance with smaller base of support                      PT Education - 07/23/15 1319    Education provided Yes   Education Details HEP   Person(s) Educated Patient;Child(ren)   Methods Explanation;Verbal cues;Handout   Comprehension Verbalized understanding;Returned demonstration;Verbal cues required             PT Long Term Goals - 07/23/15 1324    PT LONG TERM GOAL #1   Title Patient will be independent in home exercise program to improve strength/mobility for better functional independence with ADLs by 08/20/15   Time 4   Period Weeks   Status New   PT LONG TERM GOAL #2   Title Patient will increase 10 meter walk test to >1.38m/s as to improve gait speed for better community ambulation and to reduce fall risk by 08/20/15   Time 4   Period Weeks   Status New   PT LONG TERM GOAL #3    Title Patient will ascend/descend 6 stairs with 1 rail assist independently without loss of balance to improve ability to get in/out of home by 08/20/15   Time 4   Period Weeks   Status New   PT LONG TERM GOAL #4   Title  Patient will demonstrate an improved Berg Balance Score of > 45/56 as to demonstrate improved balance with ADLs such as sitting/standing and transfer balance and reduced fall risk by 08/20/15  PT LONG TERM GOAL #5   Title Patient will increase six minute walk test distance to >1000 for progression to community ambulator and improve gait ability by 08/19/14   Time 4   Period Weeks   Status New               Plan - Aug 11, 2015 1320    Clinical Impression Statement 82 yo Male s/p CVA, presents to physical therapy with difficulty walking. Patient reports using a SPC at baseline from time to time. He reports not being able to walk as well as prior to stroke. He ambulates at slower gait speed with increased foot drag bilaterally. Patient demonstrates functional strength throughout BUE and BLE. He denies any numbness/tingling. He tested as a higher fall risk due to slower gait speed. He would benefit from additional skilled PT intervention to improve balance and gait safety and reduce fall risk.    Pt will benefit from skilled therapeutic intervention in order to improve on the following deficits Abnormal gait;Decreased endurance;Cardiopulmonary status limiting activity;Decreased activity tolerance;Difficulty walking;Decreased mobility;Decreased balance;Decreased safety awareness   Rehab Potential Fair   Clinical Impairments Affecting Rehab Potential positive: motivation and support from son; negative: HTN (not controlled), high fall risk, decreased mobility at PLOF; Patient's current clinical presentation is evolving as his HTN is not well controlled (178/87 during evaluation) and he tested as a high fall risk;    PT Frequency 2x / week   PT Duration 4 weeks   PT  Treatment/Interventions Cryotherapy;Moist Heat;Balance training;Therapeutic exercise;Therapeutic activities;Functional mobility training;Stair training;Gait training;DME Instruction;Neuromuscular re-education;Patient/family education;Energy conservation   PT Next Visit Plan advance HEP with balance exercise; maybe test Sharlene Motts?   PT Home Exercise Plan initiated- see patient instructions   Consulted and Agree with Plan of Care Patient          G-Codes - 2015/08/11 1327    Functional Assessment Tool Used 6 min walk, 10 meter walk, 5 times sit<>Stand   Functional Limitation Mobility: Walking and moving around   Mobility: Walking and Moving Around Current Status ([id number]) At least 40 percent but less than 60 percent impaired, limited or restricted   Mobility: Walking and Moving Around Goal Status [phone]) At least 20 percent but less than 40 percent impaired, limited or restricted       Problem List Patient Active Problem List   Diagnosis Date Noted  . Acute CVA (cerebrovascular accident) (HCC) 07/03/2015    Trotter,Margaret PT, DPT Aug 11, 2015, 1:28 PM  [facility] MAIN [facility] SERVICES [address] [place], [place], [address] Phone: [phone]   Fax:  [phone]  Name: Christopher [name] MRN: [id number] Date of Birth: 15-May-1926

## 2015-07-25 ENCOUNTER — Ambulatory Visit: Payer: Medicare Other | Admitting: Physical Therapy

## 2015-07-25 ENCOUNTER — Ambulatory Visit: Payer: Medicare Other

## 2015-07-31 ENCOUNTER — Ambulatory Visit: Payer: Medicare Other

## 2015-07-31 DIAGNOSIS — R269 Unspecified abnormalities of gait and mobility: Secondary | ICD-10-CM

## 2015-07-31 DIAGNOSIS — Z9181 History of falling: Secondary | ICD-10-CM | POA: Diagnosis not present

## 2015-07-31 NOTE — Therapy (Signed)
[facility] MAIN [facility] SERVICES [address] [place], [place], [address] Phone: [phone]   Fax:  [phone]  Physical Therapy Treatment  Patient Details  Name: Christopher [name] MRN: [id number] Date of Birth: March 15, 1926 Referring Provider: Lynne Leader (MD), primary physician  Encounter Date: 07/31/2015      PT End of Session - 07/31/15 1233    Visit Number 2   Number of Visits 9   Date for PT Re-Evaluation 08/20/15   Authorization Type 2/10   Authorization Time Period 10   PT Start Time 0915   PT Stop Time 1000   PT Time Calculation (min) 45 min   Equipment Utilized During Treatment Gait belt   Activity Tolerance Patient tolerated treatment well   Behavior During Therapy [facility] for tasks assessed/performed      Past Medical History  Diagnosis Date  . TIA (transient ischemic attack)   . Dyslipidemia   . Arthritis   . Ataxia   . Diabetes mellitus without complication (HCC)     controlled  . Hypertension     somewhat controlled    History reviewed. No pertinent past surgical history.  There were no vitals filed for this visit.  Visit Diagnosis:  Abnormality of gait  History of fall      Subjective Assessment - 07/31/15 1231    Subjective pt asks "can you check my blood pressure" pt reports otherwise he is feeling good.   Patient is accompained by: Family member   Pertinent History Personal factors affecting rehab: depression (somewhat controlled), uncontrolled HTN, has stairs inside home; increased risk for falls;    How long can you stand comfortably? 10-15 min   How long can you walk comfortably? >500 feet   Diagnostic tests undergoing cardiac tests;    Patient Stated Goals "I want walking to get better, get stronger"       Therex" Nustep Ue/LE L 3 x 4 min with verbal cues to stay with SPM>60 Squats in // bars with UE support 2x10 Standing resisted hip flexion, abduction ,adduction and extension with   green     Band 2 x  10 each way    Pt requires mod verbal and tactile cues for proper exercise performance     NMR: Fwd step up onto 6inch box with single UE support with 3s SLS hold x 10 each leg- pt required visual cues Toe taps standing on AIREX onto 6inch step x 30 Toe taps laterally standing on AIREX onto 6inch step x 15 each way- pt required minA to maintain balance at times Side step overs onto AIREX x 10 Land R Standing NBOS on AIREX 1 min x 2 arms crossed Standing NBOS on AIREX with eyes open/closed 10s x 5 Standing NBOS on AIREX with vertical and horiz head turns x20 each  pt requires CGA for safety on balance exercises                            PT Long Term Goals - 07/23/15 1324    PT LONG TERM GOAL #1   Title Patient will be independent in home exercise program to improve strength/mobility for better functional independence with ADLs by 08/20/15   Time 4   Period Weeks   Status New   PT LONG TERM GOAL #2   Title Patient will increase 10 meter walk test to >1.52m/s as to improve gait speed for better community ambulation and to reduce  fall risk by 08/20/15   Time 4   Period Weeks   Status New   PT LONG TERM GOAL #3   Title Patient will ascend/descend 6 stairs with 1 rail assist independently without loss of balance to improve ability to get in/out of home by 08/20/15   Time 4   Period Weeks   Status New   PT LONG TERM GOAL #4   Title  Patient will demonstrate an improved Berg Balance Score of > 45/56 as to demonstrate improved balance with ADLs such as sitting/standing and transfer balance and reduced fall risk by 08/20/15   PT LONG TERM GOAL #5   Title Patient will increase six minute walk test distance to >1000 for progression to community ambulator and improve gait ability by 08/19/14   Time 4   Period Weeks   Status New               Plan - 07/31/15 1233    Clinical Impression Statement pt vitals were more controlled today at 140/81mmHg. pt demonstrates  relatively good strength bilaterally, so PT focused more on static and dynamic balance tasks. Pt required CGA to min A to correct balance at times, and also needs multiple instructions at times to perform exercises. Pts son was present during treatment today to aid with translating. Also signed translation release form. Pts son was focused on admitting pt to [employer]/ silver sneakers following therapy and was demanding PT check his insurance to see if this is covered. PT recommended focusing on current session and instructed pts son to follow up with the fitness center regarding his fathers benefits.    Pt will benefit from skilled therapeutic intervention in order to improve on the following deficits Abnormal gait;Decreased endurance;Cardiopulmonary status limiting activity;Decreased activity tolerance;Difficulty walking;Decreased mobility;Decreased balance;Decreased safety awareness   Rehab Potential Fair   Clinical Impairments Affecting Rehab Potential positive: motivation and support from son; negative: HTN (not controlled), high fall risk, decreased mobility at PLOF; Patient's current clinical presentation is evolving as his HTN is not well controlled (178/87 during evaluation) and he tested as a high fall risk;    PT Frequency 2x / week   PT Duration 4 weeks   PT Treatment/Interventions Cryotherapy;Moist Heat;Balance training;Therapeutic exercise;Therapeutic activities;Functional mobility training;Stair training;Gait training;DME Instruction;Neuromuscular re-education;Patient/family education;Energy conservation   PT Next Visit Plan higher level strengthening, focus on dynamic and static balance    PT Home Exercise Plan initiated- see patient instructions   Consulted and Agree with Plan of Care Patient        Problem List Patient Active Problem List   Diagnosis Date Noted  . Acute CVA (cerebrovascular accident) (HCC) 07/03/2015   Carlyon Shadow. Chimere Klingensmith, PT, DPT  [phone]  Ani Deoliveira 07/31/2015, 12:37 PM  [facility] MAIN [facility] SERVICES [address] [place], [place], [address] Phone: [phone]   Fax:  [phone]  Name: Christopher [name] MRN: [id number] Date of Birth: [birth year]

## 2015-08-02 ENCOUNTER — Ambulatory Visit: Payer: Medicare Other

## 2015-08-02 VITALS — BP 175/68 | HR 64

## 2015-08-02 DIAGNOSIS — R269 Unspecified abnormalities of gait and mobility: Secondary | ICD-10-CM

## 2015-08-02 DIAGNOSIS — Z9181 History of falling: Secondary | ICD-10-CM

## 2015-08-02 NOTE — Therapy (Signed)
[facility] MAIN [facility] SERVICES [address] [place], [place], [address] Phone: [phone]   Fax:  [phone]  Physical Therapy Treatment  Patient Details  Name: Christopher [name] MRN: [id number] Date of Birth: [birth year] Referring Provider: Lynne Leader (MD), primary physician  Encounter Date: 08/02/2015      PT End of Session - 08/02/15 1114    Visit Number 3   Number of Visits 9   Date for PT Re-Evaluation 08/20/15   Authorization Type 3/10   Authorization Time Period 10   PT Start Time 0932   PT Stop Time 1015   PT Time Calculation (min) 43 min   Equipment Utilized During Treatment Gait belt   Activity Tolerance Patient tolerated treatment well   Behavior During Therapy [facility] for tasks assessed/performed      Past Medical History  Diagnosis Date  . TIA (transient ischemic attack)   . Dyslipidemia   . Arthritis   . Ataxia   . Diabetes mellitus without complication (HCC)     controlled  . Hypertension     somewhat controlled    History reviewed. No pertinent past surgical history.  Filed Vitals:   08/02/15 1015 08/02/15 1110  BP: 168/69 175/68  Pulse: 70 64    Visit Diagnosis:  History of fall  Abnormality of gait      Subjective Assessment - 08/02/15 1110    Subjective pt reports he "walks slow because of the stroke"   Patient is accompained by: Family member   Pertinent History Personal factors affecting rehab: depression (somewhat controlled), uncontrolled HTN, has stairs inside home; increased risk for falls;    How long can you stand comfortably? 10-15 min   How long can you walk comfortably? >500 feet   Diagnostic tests undergoing cardiac tests;    Patient Stated Goals "I want walking to get better, get stronger"       Nustep: L3 x 3 min warm up no charge  Gait training: In hallway 4x62ft with min-mod verbal cues for longer step length R>L In hallway side stepping , SBA 4 laps of 87ft Retro walking in hallway  2x41ft  pt requires SBA- CGA for safety on balance exercises   NMR:  fwd stepping and back no UE x 10 each leg Progressed to fwd stepping over 1/2 roll x10 each leg with CGA  Side stepping over 1/2 roll x 10 each leg Cues to increase R foot clearance Standing on AIREX beam statically - pt has difficulty recovering from posterior LOB needing cues and minA 30s x 5 Standing on AIREX with PT induced perturbation in all directions min-mod verbal and visual cues for pt to utilize hip strategy, pt seemed to have partial understanding 3 x 1 min   pt requires CGA-min A for safety on balance exercises                             PT Education - 08/02/15 1113    Education provided Yes   Education Details carry over of cues for longer step length outside of clinic    Person(s) Educated Patient   Methods Explanation   Comprehension Verbalized understanding             PT Long Term Goals - 07/23/15 1324    PT LONG TERM GOAL #1   Title Patient will be independent in home exercise program to improve strength/mobility for better functional independence with ADLs by  08/20/15   Time 4   Period Weeks   Status New   PT LONG TERM GOAL #2   Title Patient will increase 10 meter walk test to >1.80m/s as to improve gait speed for better community ambulation and to reduce fall risk by 08/20/15   Time 4   Period Weeks   Status New   PT LONG TERM GOAL #3   Title Patient will ascend/descend 6 stairs with 1 rail assist independently without loss of balance to improve ability to get in/out of home by 08/20/15   Time 4   Period Weeks   Status New   PT LONG TERM GOAL #4   Title  Patient will demonstrate an improved Berg Balance Score of > 45/56 as to demonstrate improved balance with ADLs such as sitting/standing and transfer balance and reduced fall risk by 08/20/15   PT LONG TERM GOAL #5   Title Patient will increase six minute walk test distance to >1000 for progression to community  ambulator and improve gait ability by 08/19/14   Time 4   Period Weeks   Status New               Plan - 08/02/15 1114    Clinical Impression Statement pt was able to initiate longer step length initially in hallway with min cues with better foot clearance of the RLE. pt then however seemed to fatigue and have more difficulty performing this task. when asked pt reports his leg doesnt feel tired. with short rest he can improve his step length once more. pt has difficulty recovering from posterior loss of balance, needing assist when on compliant surface and cues for hip strategy.    Pt will benefit from skilled therapeutic intervention in order to improve on the following deficits Abnormal gait;Decreased endurance;Cardiopulmonary status limiting activity;Decreased activity tolerance;Difficulty walking;Decreased mobility;Decreased balance;Decreased safety awareness   Rehab Potential Fair   Clinical Impairments Affecting Rehab Potential positive: motivation and support from son; negative: HTN (not controlled), high fall risk, decreased mobility at PLOF; Patient's current clinical presentation is evolving as his HTN is not well controlled (178/87 during evaluation) and he tested as a high fall risk;    PT Frequency 2x / week   PT Duration 4 weeks   PT Treatment/Interventions Cryotherapy;Moist Heat;Balance training;Therapeutic exercise;Therapeutic activities;Functional mobility training;Stair training;Gait training;DME Instruction;Neuromuscular re-education;Patient/family education;Energy conservation   PT Next Visit Plan higher level strengthening, focus on dynamic and static balance    PT Home Exercise Plan initiated- see patient instructions   Consulted and Agree with Plan of Care Patient        Problem List Patient Active Problem List   Diagnosis Date Noted  . Acute CVA (cerebrovascular accident) (HCC) 07/03/2015   Carlyon Shadow. Caitrin Pendergraph, PT, DPT [phone]  Ilona Colley 08/02/2015,  11:17 AM  [facility] MAIN [facility] SERVICES [address] [place], [place], [address] Phone: [phone]   Fax:  [phone]  Name: Christopher [name] MRN: [id number] Date of Birth: 01/10/1926

## 2015-08-07 ENCOUNTER — Ambulatory Visit: Payer: Medicare Other

## 2015-08-07 VITALS — BP 146/68 | HR 66

## 2015-08-07 DIAGNOSIS — Z9181 History of falling: Secondary | ICD-10-CM

## 2015-08-07 DIAGNOSIS — R269 Unspecified abnormalities of gait and mobility: Secondary | ICD-10-CM

## 2015-08-07 NOTE — Therapy (Signed)
[facility] MAIN [facility] SERVICES [address] [place], [place], [address] Phone: [phone]   Fax:  [phone]  Physical Therapy Treatment  Patient Details  Name: Christopher [name] MRN: [id number] Date of Birth: 03-20-1926 Referring Provider: Lynne Leader (MD), primary physician  Encounter Date: 08/07/2015      PT End of Session - 08/07/15 1400    Visit Number 4   Number of Visits 9   Date for PT Re-Evaluation 08/20/15   Authorization Type 4/10   Authorization Time Period 10   PT Start Time 1130   PT Stop Time 1200   PT Time Calculation (min) 30 min   Equipment Utilized During Treatment Gait belt   Activity Tolerance Patient tolerated treatment well   Behavior During Therapy [facility] for tasks assessed/performed      Past Medical History  Diagnosis Date  . TIA (transient ischemic attack)   . Dyslipidemia   . Arthritis   . Ataxia   . Diabetes mellitus without complication (HCC)     controlled  . Hypertension     somewhat controlled    History reviewed. No pertinent past surgical history.  Filed Vitals:   08/07/15 1400  BP: 146/68  Pulse: 66    Visit Diagnosis:  History of fall  Abnormality of gait      Subjective Assessment - 08/07/15 1359    Subjective pt reports he is trying to take longer steps   Patient is accompained by: Family member   Pertinent History Personal factors affecting rehab: depression (somewhat controlled), uncontrolled HTN, has stairs inside home; increased risk for falls;    How long can you stand comfortably? 10-15 min   How long can you walk comfortably? >500 feet   Diagnostic tests undergoing cardiac tests;    Patient Stated Goals "I want walking to get better, get stronger"   Currently in Pain? No/denies       NMR: Standing hip flexion/abduction/extension x 10 each side with no HHA or finger tip support. Cues for weight shift. Mod cues for proper exercise performance Standing on tilt board AP and  side/side weight shifting 2x 1 min then holding balance in between 2 min x 1 Toe taps on BOSU standing on AIREX 2x20 fwd and x 10 each leg side stepping Standing on AIREX with arms crossed NBOS 3x30s Standing on AIREX with arms crossed EO/EC 10s x 5 Standing on AIREX with vertical head nods and horiz head turns x 15 each   pt requires CGA to min  A for safety on balance exercises                                 PT Long Term Goals - 07/23/15 1324    PT LONG TERM GOAL #1   Title Patient will be independent in home exercise program to improve strength/mobility for better functional independence with ADLs by 08/20/15   Time 4   Period Weeks   Status New   PT LONG TERM GOAL #2   Title Patient will increase 10 meter walk test to >1.40m/s as to improve gait speed for better community ambulation and to reduce fall risk by 08/20/15   Time 4   Period Weeks   Status New   PT LONG TERM GOAL #3   Title Patient will ascend/descend 6 stairs with 1 rail assist independently without loss of balance to improve ability to get in/out of home  by 08/20/15   Time 4   Period Weeks   Status New   PT LONG TERM GOAL #4   Title  Patient will demonstrate an improved Berg Balance Score of > 45/56 as to demonstrate improved balance with ADLs such as sitting/standing and transfer balance and reduced fall risk by 08/20/15   PT LONG TERM GOAL #5   Title Patient will increase six minute walk test distance to >1000 for progression to community ambulator and improve gait ability by 08/19/14   Time 4   Period Weeks   Status New               Plan - 08/07/15 1403    Clinical Impression Statement pt arrived 15 min late for apt. pt at times needs multiple verbal and visual instructions/demonstration to perform exercises. pt does demonstrate improved balance on compliant surfaces and was able to do so with eyes closed today for short bouts. pt has difficulty at times controlling the RLE.    Pt  will benefit from skilled therapeutic intervention in order to improve on the following deficits Abnormal gait;Decreased endurance;Cardiopulmonary status limiting activity;Decreased activity tolerance;Difficulty walking;Decreased mobility;Decreased balance;Decreased safety awareness   Rehab Potential Fair   Clinical Impairments Affecting Rehab Potential positive: motivation and support from son; negative: HTN (not controlled), high fall risk, decreased mobility at PLOF; Patient's current clinical presentation is evolving as his HTN is not well controlled (178/87 during evaluation) and he tested as a high fall risk;    PT Frequency 2x / week   PT Duration 4 weeks   PT Treatment/Interventions Cryotherapy;Moist Heat;Balance training;Therapeutic exercise;Therapeutic activities;Functional mobility training;Stair training;Gait training;DME Instruction;Neuromuscular re-education;Patient/family education;Energy conservation   PT Next Visit Plan higher level strengthening, focus on dynamic and static balance    PT Home Exercise Plan initiated- see patient instructions   Consulted and Agree with Plan of Care Patient        Problem List Patient Active Problem List   Diagnosis Date Noted  . Acute CVA (cerebrovascular accident) (HCC) 07/03/2015   Carlyon Shadow. Vinaya Sancho, PT, DPT [phone]  [name] 08/07/2015, 2:05 PM  [facility] MAIN [facility] SERVICES [address] [place], [place], [address] Phone: [phone]   Fax:  [phone]  Name: Christopher [name] MRN: [id number] Date of Birth: March 27, 1926

## 2015-08-09 ENCOUNTER — Ambulatory Visit: Payer: Medicare Other | Attending: Family Medicine

## 2015-08-09 VITALS — BP 145/78 | HR 70

## 2015-08-09 DIAGNOSIS — R269 Unspecified abnormalities of gait and mobility: Secondary | ICD-10-CM | POA: Insufficient documentation

## 2015-08-09 DIAGNOSIS — Z9181 History of falling: Secondary | ICD-10-CM | POA: Diagnosis present

## 2015-08-09 NOTE — Therapy (Signed)
[facility] [facility] MAIN [facility] SERVICES [address] [place], [place], [address] Phone: [phone]   Fax:  [phone]  Physical Therapy Treatment  Patient Details  Name: Christopher [name] MRN: [id number] Date of Birth: 04-Apr-1926 Referring Provider: Lynne Leader (MD), primary physician  Encounter Date: 08/09/2015      PT End of Session - 08/09/15 1528    Visit Number 5   Number of Visits 9   Date for PT Re-Evaluation 08/20/15   Authorization Type 5/10   Authorization Time Period 10   PT Start Time 1115   PT Stop Time 1155   PT Time Calculation (min) 40 min   Equipment Utilized During Treatment Gait belt   Activity Tolerance Patient tolerated treatment well   Behavior During Therapy [facility] for tasks assessed/performed      Past Medical History  Diagnosis Date  . TIA (transient ischemic attack)   . Dyslipidemia   . Arthritis   . Ataxia   . Diabetes mellitus without complication (HCC)     controlled  . Hypertension     somewhat controlled    No past surgical history on file.  Filed Vitals:   08/09/15 1128  BP: 145/78  Pulse: 70    Visit Diagnosis:  History of fall  Abnormality of gait      Subjective Assessment - 08/09/15 1128    Subjective pt reports he is trying to take longer steps but "cant because of his age and the stroke"   Patient is accompained by: Family member   Pertinent History Personal factors affecting rehab: depression (somewhat controlled), uncontrolled HTN, has stairs inside home; increased risk for falls;    How long can you stand comfortably? 10-15 min   How long can you walk comfortably? >500 feet   Diagnostic tests undergoing cardiac tests;    Patient Stated Goals "I want walking to get better, get stronger"        Therex: Nustep no charge warm up L5 x 2 min no charge Single leg press: 75lbs 3x10 bilaterally Resisted walking: 7.5lbs fwd/retro x 5 laps each x 74ft  7.5lbs side stepping resisted walking x 3  laps each Pt required CGA - min A for safety Pt required mod cues for foot clearance R>L and increased step length.  Marching in place with single HHA 30s x 4                         PT Education - 08/09/15 1528    Education provided Yes   Education Details marching in place. increase foot clearance              PT Long Term Goals - 07/23/15 1324    PT LONG TERM GOAL #1   Title Patient will be independent in home exercise program to improve strength/mobility for better functional independence with ADLs by 08/20/15   Time 4   Period Weeks   Status New   PT LONG TERM GOAL #2   Title Patient will increase 10 meter walk test to >1.72m/s as to improve gait speed for better community ambulation and to reduce fall risk by 08/20/15   Time 4   Period Weeks   Status New   PT LONG TERM GOAL #3   Title Patient will ascend/descend 6 stairs with 1 rail assist independently without loss of balance to improve ability to get in/out of home by 08/20/15   Time 4   Period Weeks  Status New   PT LONG TERM GOAL #4   Title  Patient will demonstrate an improved Berg Balance Score of > 45/56 as to demonstrate improved balance with ADLs such as sitting/standing and transfer balance and reduced fall risk by 08/20/15   PT LONG TERM GOAL #5   Title Patient will increase six minute walk test distance to >1000 for progression to community ambulator and improve gait ability by 08/19/14   Time 4   Period Weeks   Status New               Plan - 08/09/15 1528    Clinical Impression Statement Pt progressed over ground training today with weighted resisted walking. This took the patient quite a bit of time. He was able to take longer steps and increase foot clearance when cued, but then reverts back to a shuffled gait. pt has fair carry over of gait education between sessions.    Pt will benefit from skilled therapeutic intervention in order to improve on the following deficits Abnormal  gait;Decreased endurance;Cardiopulmonary status limiting activity;Decreased activity tolerance;Difficulty walking;Decreased mobility;Decreased balance;Decreased safety awareness   Rehab Potential Fair   Clinical Impairments Affecting Rehab Potential positive: motivation and support from son; negative: HTN (not controlled), high fall risk, decreased mobility at PLOF; Patient's current clinical presentation is evolving as his HTN is not well controlled (178/87 during evaluation) and he tested as a high fall risk;    PT Frequency 2x / week   PT Duration 4 weeks   PT Treatment/Interventions Cryotherapy;Moist Heat;Balance training;Therapeutic exercise;Therapeutic activities;Functional mobility training;Stair training;Gait training;DME Instruction;Neuromuscular re-education;Patient/family education;Energy conservation   PT Next Visit Plan higher level strengthening, focus on dynamic and static balance    PT Home Exercise Plan initiated- see patient instructions   Consulted and Agree with Plan of Care Patient        Problem List Patient Active Problem List   Diagnosis Date Noted  . Acute CVA (cerebrovascular accident) (HCC) 07/03/2015   Christopher [name]. Christopher [name], PT, DPT [phone]  Christopher [name] 08/09/2015, 3:30 PM  [place] [facility] MAIN [facility] SERVICES [address] [place], [place], [address] Phone: [phone]   Fax:  [phone]  Name: Christopher [name] MRN: [id number] Date of Birth: [birth year]

## 2015-08-14 ENCOUNTER — Ambulatory Visit: Payer: Medicare Other

## 2015-08-16 ENCOUNTER — Ambulatory Visit: Payer: Medicare Other

## 2015-08-16 DIAGNOSIS — Z9181 History of falling: Secondary | ICD-10-CM | POA: Diagnosis not present

## 2015-08-16 DIAGNOSIS — R269 Unspecified abnormalities of gait and mobility: Secondary | ICD-10-CM

## 2015-08-16 NOTE — Therapy (Signed)
[place] MAIN [facility] SERVICES [address] [place], [place], [address] Phone: [phone]   Fax:  [phone]  Physical Therapy Treatment / Discharge summary  Patient Details  Name: Christopher [name] MRN: [id number] Date of Birth: 28-Dec-1925 Referring Provider: Nigel Bridgeman (MD), primary physician  Encounter Date: 08/16/2015      PT End of Session - 08/16/15 1329    Visit Number 6   Number of Visits 9   Date for PT Re-Evaluation 08/20/15   Authorization Type 6   Authorization Time Period 10   PT Start Time 1130   PT Stop Time 1200   PT Time Calculation (min) 30 min   Equipment Utilized During Treatment Gait belt   Activity Tolerance Patient tolerated treatment well   Behavior During Therapy [facility] for tasks assessed/performed      Past Medical History  Diagnosis Date  . TIA (transient ischemic attack)   . Dyslipidemia   . Arthritis   . Ataxia   . Diabetes mellitus without complication ([place])     controlled  . Hypertension     somewhat controlled    History reviewed. No pertinent past surgical history.  There were no vitals filed for this visit.  Visit Diagnosis:  History of fall  Abnormality of gait      Subjective Assessment - 08/16/15 1329    Subjective "I feel like I am doing better, I can walk betteR"    Patient is accompained by: Family member   Pertinent History Personal factors affecting rehab: depression (somewhat controlled), uncontrolled HTN, has stairs inside home; increased risk for falls;    How long can you stand comfortably? 10-15 min   How long can you walk comfortably? >500 feet   Diagnostic tests undergoing cardiac tests;    Patient Stated Goals "I want walking to get better, get stronger"   Currently in Pain? No/denies      Therex: PT reassessed outcome measures and progress towards goals as follows:       [facility] PT Assessment - 08/16/15 0001    Standardized Balance Assessment   Standardized Balance  Assessment Berg Balance Test   Berg Balance Test   Sit to Stand Able to stand without using hands and stabilize independently   Standing Unsupported Able to stand safely 2 minutes   Sitting with Back Unsupported but Feet Supported on Floor or Stool Able to sit safely and securely 2 minutes   Stand to Sit Sits safely with minimal use of hands   Transfers Able to transfer safely, minor use of hands   Standing Unsupported with Eyes Closed Able to stand 10 seconds safely   Standing Ubsupported with Feet Together Able to place feet together independently and stand 1 minute safely   From Standing, Reach Forward with Outstretched Arm Can reach forward >12 cm safely (5")   From Standing Position, Pick up Object from Floor Able to pick up shoe safely and easily   From Standing Position, Turn to Look Behind Over each Shoulder Looks behind from both sides and weight shifts well   Turn 360 Degrees Able to turn 360 degrees safely one side only in 4 seconds or less   Standing Unsupported, Alternately Place Feet on Step/Stool Able to stand independently and safely and complete 8 steps in 20 seconds   Standing Unsupported, One Foot in Front Able to plae foot ahead of the other independently and hold 30 seconds   Standing on One Leg Tries to lift leg/unable to  hold 3 seconds but remains standing independently   Total Score 50      64mwalk test: 0.922m 5x sit to stand no HHA: 12s                [facility] Adult PT Treatment/Exercise - 08/16/15 0001    Standardized Balance Assessment   Standardized Balance Assessment Berg Balance Test                PT Education - 08/16/15 1329    Education provided Yes   Education Details reason for DC, met PT goals, returned to PLOF   Person(s) Educated Patient   Methods Explanation   Comprehension Verbalized understanding             PT Long Term Goals - 08/16/15 1331    PT LONG TERM GOAL #1   Title Patient will be independent in home  exercise program to improve strength/mobility for better functional independence with ADLs by 08/20/15   Time 4   Period Weeks   Status Partially Met   PT LONG TERM GOAL #2   Title Patient will increase 10 meter walk test to >1.94m76mas to improve gait speed for better community ambulation and to reduce fall risk by 08/20/15   Time 4   Period Weeks   Status Achieved   PT LONG TERM GOAL #3   Title Patient will ascend/descend 6 stairs with 1 rail assist independently without loss of balance to improve ability to get in/out of home by 08/20/15   Time 4   Period Weeks   Status Achieved   PT LONG TERM GOAL #4   Title  Patient will demonstrate an improved Berg Balance Score of > 45/56 as to demonstrate improved balance with ADLs such as sitting/standing and transfer balance and reduced fall risk by 08/20/15   Status Achieved   PT LONG TERM GOAL #5   Title Patient will increase six minute walk test distance to >1000 for progression to community ambulator and improve gait ability by 08/19/14   Time 4   Period Weeks   Status Achieved               Plan - 08/16/15 1329    Clinical Impression Statement pt has acheived PT goals at this time and scored 50/56 on the berg balance test. He is no longer a fall risk. pt has improved his walking speed to a normal home ambulator which was his PLOF. pt has achieved PT goals at this time and will be DC to indpendent exercise program. pt verbalizes understanding. PT [facility] [place] benefit from skilled therapeutic intervention in order to improve on the following deficits Abnormal gait;Decreased endurance;Cardiopulmonary status limiting activity;Decreased activity tolerance;Difficulty walking;Decreased mobility;Decreased balance;Decreased safety awareness   Rehab Potential Fair   Clinical Impairments Affecting Rehab Potential positive: motivation and support from son; negative: HTN (not controlled), high  fall risk, decreased mobility at PLOF; Patient's current clinical presentation is evolving as his HTN is not well controlled (178/87 during evaluation) and he tested as a high fall risk;    PT Frequency 2x / week   PT Duration 4 weeks   PT Treatment/Interventions Cryotherapy;Moist Heat;Balance training;Therapeutic exercise;Therapeutic activities;Functional mobility training;Stair training;Gait training;DME Instruction;Neuromuscular re-education;Patient/family education;Energy conservation   PT Next Visit Plan higher level strengthening, focus on dynamic and static balance    PT Home Exercise Plan initiated- see patient instructions   Consulted and  Agree with Plan of Care Patient          G-Codes - Aug 27, 2015 1331    Functional Assessment Tool Used 76mwalk: 5x sit to stand, Berg   Functional Limitation Mobility: Walking and moving around   Mobility: Walking and Moving Around Current Status [phone] At least 1 percent but less than 20 percent impaired, limited or restricted   Mobility: Walking and Moving Around Goal Status [phone] At least 1 percent but less than 20 percent impaired, limited or restricted   Mobility: Walking and Moving Around Discharge Status [phone] At least 1 percent but less than 20 percent impaired, limited or restricted      Problem List Patient Active Problem List   Diagnosis Date Noted  . Acute CV[place] (ce[place]brovascular accident) [place] 07/03/2015   AGorden Harms Sharada Albornoz, PT, DPT #[phone] Ruvim Risko 202/20/17 1:32 PM  [facility] [address] [address]Phone: [phone]  Fax:  [phone] Name: ITranquilino FischlerMRN: [id number] of Birth: [birth year]

## 2015-08-21 ENCOUNTER — Ambulatory Visit: Payer: Medicare Other

## 2015-08-23 ENCOUNTER — Ambulatory Visit: Payer: Medicare Other

## 2015-08-28 ENCOUNTER — Ambulatory Visit: Payer: Medicare Other

## 2015-08-30 ENCOUNTER — Ambulatory Visit: Payer: Medicare Other

## 2015-09-04 ENCOUNTER — Ambulatory Visit: Payer: Medicare Other

## 2015-09-06 ENCOUNTER — Ambulatory Visit: Payer: Medicare Other

## 2015-10-01 ENCOUNTER — Encounter: Payer: Self-pay | Admitting: Emergency Medicine

## 2015-10-01 ENCOUNTER — Emergency Department: Payer: Medicare Other

## 2015-10-01 ENCOUNTER — Emergency Department
Admission: EM | Admit: 2015-10-01 | Discharge: 2015-10-01 | Disposition: A | Discharge status: Home / Self Care | Payer: Medicare Other | Attending: Emergency Medicine | Admitting: Emergency Medicine

## 2015-10-01 DIAGNOSIS — W07XXXA Fall from chair, initial encounter: Secondary | ICD-10-CM | POA: Diagnosis not present

## 2015-10-01 DIAGNOSIS — Z791 Long term (current) use of non-steroidal anti-inflammatories (NSAID): Secondary | ICD-10-CM | POA: Insufficient documentation

## 2015-10-01 DIAGNOSIS — Z7901 Long term (current) use of anticoagulants: Secondary | ICD-10-CM | POA: Insufficient documentation

## 2015-10-01 DIAGNOSIS — Y9389 Activity, other specified: Secondary | ICD-10-CM | POA: Insufficient documentation

## 2015-10-01 DIAGNOSIS — Z7984 Long term (current) use of oral hypoglycemic drugs: Secondary | ICD-10-CM | POA: Insufficient documentation

## 2015-10-01 DIAGNOSIS — S0090XA Unspecified superficial injury of unspecified part of head, initial encounter: Secondary | ICD-10-CM | POA: Diagnosis not present

## 2015-10-01 DIAGNOSIS — Z87891 Personal history of nicotine dependence: Secondary | ICD-10-CM | POA: Insufficient documentation

## 2015-10-01 DIAGNOSIS — W19XXXA Unspecified fall, initial encounter: Secondary | ICD-10-CM

## 2015-10-01 DIAGNOSIS — E785 Hyperlipidemia, unspecified: Secondary | ICD-10-CM | POA: Diagnosis not present

## 2015-10-01 DIAGNOSIS — S20211A Contusion of right front wall of thorax, initial encounter: Secondary | ICD-10-CM | POA: Diagnosis not present

## 2015-10-01 DIAGNOSIS — Y999 Unspecified external cause status: Secondary | ICD-10-CM | POA: Diagnosis not present

## 2015-10-01 DIAGNOSIS — Z7982 Long term (current) use of aspirin: Secondary | ICD-10-CM | POA: Insufficient documentation

## 2015-10-01 DIAGNOSIS — M1990 Unspecified osteoarthritis, unspecified site: Secondary | ICD-10-CM | POA: Insufficient documentation

## 2015-10-01 DIAGNOSIS — I10 Essential (primary) hypertension: Secondary | ICD-10-CM | POA: Insufficient documentation

## 2015-10-01 DIAGNOSIS — S39012A Strain of muscle, fascia and tendon of lower back, initial encounter: Secondary | ICD-10-CM | POA: Diagnosis not present

## 2015-10-01 DIAGNOSIS — Y929 Unspecified place or not applicable: Secondary | ICD-10-CM | POA: Diagnosis not present

## 2015-10-01 DIAGNOSIS — E119 Type 2 diabetes mellitus without complications: Secondary | ICD-10-CM | POA: Insufficient documentation

## 2015-10-01 DIAGNOSIS — G459 Transient cerebral ischemic attack, unspecified: Secondary | ICD-10-CM | POA: Insufficient documentation

## 2015-10-01 DIAGNOSIS — R270 Ataxia, unspecified: Secondary | ICD-10-CM | POA: Diagnosis not present

## 2015-10-01 DIAGNOSIS — M545 Low back pain: Secondary | ICD-10-CM | POA: Diagnosis present

## 2015-10-01 DIAGNOSIS — S0990XA Unspecified injury of head, initial encounter: Secondary | ICD-10-CM

## 2015-10-01 MED ORDER — DOCUSATE SODIUM 100 MG PO CAPS: 100.0000 mg | ORAL_CAPSULE | Freq: Every day | ORAL | Status: DC | PRN

## 2015-10-01 MED ORDER — TRAMADOL HCL 50 MG PO TABS: 50.0000 mg | ORAL_TABLET | Freq: Four times a day (QID) | ORAL | Status: AC | PRN

## 2015-10-01 NOTE — ED Notes (Signed)
Spoke with patient's daughter-in-law, who states that the patient's son would pick patient up from lobby and that it would be ok to give patient his discharge instructions.

## 2015-10-01 NOTE — ED Notes (Signed)
Contact number called and phone given to dad to communicate. Phone number for son received.

## 2015-10-01 NOTE — ED Notes (Signed)
Pt and his son state that he was standing on a wheeled chair trying to put up curtains while the son had gone to the grocery store and fell from the chair.  Pt is complaining of right sided back pain from hip to right shoulder.  Son states that the patient has a recent history of CVA.

## 2015-10-01 NOTE — ED Provider Notes (Signed)
[facility] Emergency Department Provider Note     Time seen: ----------------------------------------- 2:06 PM on 10/01/2015 -----------------------------------------    I have reviewed the triage vital signs and the nursing notes.   HISTORY  Chief Complaint Fall    HPI Christopher [name] is a 82 y.o. male who presents ER after he was standing on a wheeled chair and sustained a fall. Patient was trying to put up curtains and he fell from the chair landing on his right side. He is complaining of head, right side, low back and right hip pain. Son states patient has history of CVA. He currently takes Plavix and aspirin. Patient denies loss of consciousness but does complain of head injury.   Past Medical History  Diagnosis Date  . TIA (transient ischemic attack)   . Dyslipidemia   . Arthritis   . Ataxia   . Diabetes mellitus without complication (HCC)     controlled  . Hypertension     somewhat controlled    Patient Active Problem List   Diagnosis Date Noted  . Acute CVA (cerebrovascular accident) (HCC) 07/03/2015    No past surgical history on file.  Allergies Hydrochlorothiazide  Social History Social History  Substance Use Topics  . Smoking status: Former Games developermoker  . Smokeless tobacco: Former NeurosurgeonUser  . Alcohol Use: No    Review of Systems Constitutional: Negative for fever. Eyes: Negative for visual changes. ENT: Negative for sore throat. Cardiovascular: Negative for chest pain. Respiratory: Negative for shortness of breath. Gastrointestinal: Negative for abdominal pain, vomiting and diarrhea. Genitourinary: Negative for dysuria. Musculoskeletal: Positive for head pain, neck pain, back pain, right rib pain, right hip pain Skin: Negative for rash. Neurological: Positive for headache  10-point ROS otherwise negative.  ____________________________________________   PHYSICAL EXAM:  VITAL SIGNS: ED Triage Vitals  Enc Vitals Group     BP 10/01/15 1401 151/85 mmHg     Pulse Rate 10/01/15 1401 70     Resp 10/01/15 1401 20     Temp 10/01/15 1401 97.9 F (36.6 C)     Temp Source 10/01/15 1401 Oral     SpO2 --      Weight 10/01/15 1401 158 lb (71.668 kg)     Height 10/01/15 1401 5\' 5"  (1.651 m)     Head Cir --      Peak Flow --      Pain Score --      Pain Loc --      Pain Edu? --      Excl. in GC? --     Constitutional: Alert and oriented. Well appearing and in no distress. Eyes: Conjunctivae are normal. PERRL. Normal extraocular movements. ENT   Head: Normocephalic and atraumatic.   Nose: No congestion/rhinnorhea.   Mouth/Throat: Mucous membranes are moist.   Neck: No stridor. Cardiovascular: Normal rate, regular rhythm. Normal and symmetric distal pulses are present in all extremities. No murmurs, rubs, or gallops. Respiratory: Normal respiratory effort without tachypnea nor retractions. Breath sounds are clear and equal bilaterally. No wheezes/rales/rhonchi. Gastrointestinal: Soft and nontender. No distention. No abdominal bruits.  Musculoskeletal: Mild tenderness in the right chest wall, right hip, lumbar spine, and cervical spine Neurologic:  Normal speech and language. No gross focal neurologic deficits are appreciated.  Skin:  Skin is warm, dry and intact. No rash noted. Psychiatric: Mood and affect are normal. Speech and behavior are normal. Patient exhibits appropriate insight and judgment. ____________________________________________  ED COURSE:  Pertinent labs & imaging results that were  available during my care of the patient were reviewed by me and considered in my medical decision making (see chart for details). Patient sustained a fall from a stool or rolling chair. We will obtain basic x-rays and reevaluate. ____________________________________________    RADIOLOGY Images were viewed by me  CT head, C-spine, rib x-rays, lumbar spine x-rays, right hip x-rays IMPRESSION: 1. No  acute intracranial abnormality. No intracranial mass, hemorrhage, or edema. No skull fracture. 2. No fracture or acute subluxation identified in the cervical spine. Extensive degenerative change throughout the cervical spine which appears similar to the MRI cervical spine dated 10/21/2010 IMPRESSION: No acute RIGHT rib abnormalities.  Enlargement of cardiac silhouette. IMPRESSION: 1. No acute fracture or malalignment. 2. Multilevel degenerative disc disease and lower lumbar facet arthropathy. 3. Aortic atherosclerosis.  IMPRESSION: No acute osseous findings. ____________________________________________  FINAL ASSESSMENT AND PLAN  Fall, on her head injury, rib contusion, lumbosacral strain, hip contusion  Plan: Patient with imaging as dictated above. Patient is in no acute distress, will be discharged with pain medication and encouraged to have close follow-up with his doctor for reevaluation. No acute bony injuries identified at this time.   Emily Filbert, MD   Emily Filbert, MD 10/01/15 [id number]

## 2015-10-01 NOTE — Discharge Instructions (Signed)
Head Injury, Adult You have a head injury. Headaches and throwing up (vomiting) are common after a head injury. It should be easy to wake up from sleeping. Sometimes you must stay in the hospital. Most problems happen within the first 24 hours. Side effects may occur up to 7-10 days after the injury.  WHAT ARE THE TYPES OF HEAD INJURIES? Head injuries can be as minor as a bump. Some head injuries can be more severe. More severe head injuries include:  A jarring injury to the brain (concussion).  A bruise of the brain (contusion). This mean there is bleeding in the brain that can cause swelling.  A cracked skull (skull fracture).  Bleeding in the brain that collects, clots, and forms a bump (hematoma). WHEN SHOULD I GET HELP RIGHT AWAY?   You are confused or sleepy.  You cannot be woken up.  You feel sick to your stomach (nauseous) or keep throwing up (vomiting).  Your dizziness or unsteadiness is getting worse.  You have very bad, lasting headaches that are not helped by medicine. Take medicines only as told by your doctor.  You cannot use your arms or legs like normal.  You cannot walk.  You notice changes in the black spots in the center of the colored part of your eye (pupil).  You have clear or bloody fluid coming from your nose or ears.  You have trouble seeing. During the next 24 hours after the injury, you must stay with someone who can watch you. This person should get help right away (call 911 in the [place] if you start to shake and are not able to control it (have seizures), you pass out, or you are unable to wake up. HOW CAN I PREVENT A HEAD INJURY IN THE FUTURE?  Wear seat belts.  Wear a helmet while bike riding and playing sports like football.  Stay away from dangerous activities around the house. WHEN CAN I RETURN TO NORMAL ACTIVITIES AND ATHLETICS? See your doctor before doing these activities. You should not do normal activities or play contact sports until 1  week after the following symptoms have stopped:  Headache that does not go away.  Dizziness.  Poor attention.  Confusion.  Memory problems.  Sickness to your stomach or throwing up.  Tiredness.  Fussiness.  Bothered by bright lights or loud noises.  Anxiousness or depression.  Restless sleep. MAKE SURE YOU:   Understand these instructions.  Will watch your condition.  Will get help right away if you are not doing well or get worse.   This information is not intended to replace advice given to you by your health care provider. Make sure you discuss any questions you have with your health care provider.   Document Released: 06/05/2008 Document Revised: 07/14/2014 Document Reviewed: 02/28/2013 [employer] Interactive Patient Education 2016 [employer]  Rib Contusion A rib contusion is a deep bruise on your rib area. Contusions are the result of a blunt trauma that causes bleeding and injury to the tissues under the skin. A rib contusion may involve bruising of the ribs and of the skin and muscles in the area. The skin overlying the contusion may turn blue, purple, or yellow. Minor injuries will give you a painless contusion, but more severe contusions may stay painful and swollen for a few weeks. CAUSES  A contusion is usually caused by a blow, trauma, or direct force to an area of the body. This often occurs while playing contact sports. SYMPTOMS  Swelling  and redness of the injured area.  Discoloration of the injured area.  Tenderness and soreness of the injured area.  Pain with or without movement. DIAGNOSIS  The diagnosis can be made by taking a medical history and performing a physical exam. An X-ray, CT scan, or MRI may be needed to determine if there were any associated injuries, such as broken bones (fractures) or internal injuries. TREATMENT  Often, the best treatment for a rib contusion is rest. Icing or applying cold compresses to the injured area may help  reduce swelling and inflammation. Deep breathing exercises may be recommended to reduce the risk of partial lung collapse and pneumonia. Over-the-counter or prescription medicines may also be recommended for pain control. HOME CARE INSTRUCTIONS   Apply ice to the injured area:  Put ice in a plastic bag.  Place a towel between your skin and the bag.  Leave the ice on for 20 minutes, 2-3 times per day.  Take medicines only as directed by your health care provider.  Rest the injured area. Avoid strenuous activity and any activities or movements that cause pain. Be careful during activities and avoid bumping the injured area.  Perform deep-breathing exercises as directed by your health care provider.  Do not lift anything that is heavier than 5 lb (2.3 kg) until your health care provider approves.  Do not use any tobacco products, including cigarettes, chewing tobacco, or electronic cigarettes. If you need help quitting, ask your health care provider. SEEK MEDICAL CARE IF:   You have increased bruising or swelling.  You have pain that is not controlled with treatment.  You have a fever. SEEK IMMEDIATE MEDICAL CARE IF:   You have difficulty breathing or shortness of breath.  You develop a continual cough, or you cough up thick or bloody sputum.  You feel sick to your stomach (nauseous), you throw up (vomit), or you have abdominal pain.   This information is not intended to replace advice given to you by your health care provider. Make sure you discuss any questions you have with your health care provider.   Document Released: 03/18/2001 Document Revised: 07/14/2014 Document Reviewed: 04/04/2014 [employer] Interactive Patient Education 2016 [employer]  Low Back Sprain With Rehab A sprain is an injury in which a ligament is torn. The ligaments of the lower back are vulnerable to sprains. However, they are strong and require great force to be injured. These ligaments are important  for stabilizing the spinal column. Sprains are classified into three categories. Grade 1 sprains cause pain, but the tendon is not lengthened. Grade 2 sprains include a lengthened ligament, due to the ligament being stretched or partially ruptured. With grade 2 sprains there is still function, although the function may be decreased. Grade 3 sprains involve a complete tear of the tendon or muscle, and function is usually impaired. SYMPTOMS   Severe pain in the lower back.  Sometimes, a feeling of a "pop," "snap," or tear, at the time of injury.  Tenderness and sometimes swelling at the injury site.  Uncommonly, bruising (contusion) within 48 hours of injury.  Muscle spasms in the back. CAUSES  Low back sprains occur when a force is placed on the ligaments that is greater than they can handle. Common causes of injury include:  Performing a stressful act while off-balance.  Repetitive stressful activities that involve movement of the lower back.  Direct hit (trauma) to the lower back. RISK INCREASES WITH:  Contact sports (football, wrestling).  Collisions (major skiing  accidents).  Sports that require throwing or lifting (baseball, weightlifting).  Sports involving twisting of the spine (gymnastics, diving, tennis, golf).  Poor strength and flexibility.  Inadequate protection.  Previous back injury or surgery (especially fusion). PREVENTION  Wear properly fitted and padded protective equipment.  Warm up and stretch properly before activity.  Allow for adequate recovery between workouts.  Maintain physical fitness:  Strength, flexibility, and endurance.  Cardiovascular fitness.  Maintain a healthy body weight. PROGNOSIS  If treated properly, low back sprains usually heal with non-surgical treatment. The length of time for healing depends on the severity of the injury.  RELATED COMPLICATIONS   Recurring symptoms, resulting in a chronic problem.  Chronic inflammation  and pain in the low back.  Delayed healing or resolution of symptoms, especially if activity is resumed too soon.  Prolonged impairment.  Unstable or arthritic joints of the low back. TREATMENT  Treatment first involves the use of ice and medicine, to reduce pain and inflammation. The use of strengthening and stretching exercises may help reduce pain with activity. These exercises may be performed at home or with a therapist. Severe injuries may require referral to a therapist for further evaluation and treatment, such as ultrasound. Your caregiver may advise that you wear a back brace or corset, to help reduce pain and discomfort. Often, prolonged bed rest results in greater harm then benefit. Corticosteroid injections may be recommended. However, these should be reserved for the most serious cases. It is important to avoid using your back when lifting objects. At night, sleep on your back on a firm mattress, with a pillow placed under your knees. If non-surgical treatment is unsuccessful, surgery may be needed.  MEDICATION   If pain medicine is needed, nonsteroidal anti-inflammatory medicines (aspirin and ibuprofen), or other minor pain relievers (acetaminophen), are often advised.  Do not take pain medicine for 7 days before surgery.  Prescription pain relievers may be given, if your caregiver thinks they are needed. Use only as directed and only as much as you need.  Ointments applied to the skin may be helpful.  Corticosteroid injections may be given by your caregiver. These injections should be reserved for the most serious cases, because they may only be given a certain number of times. HEAT AND COLD  Cold treatment (icing) should be applied for 10 to 15 minutes every 2 to 3 hours for inflammation and pain, and immediately after activity that aggravates your symptoms. Use ice packs or an ice massage.  Heat treatment may be used before performing stretching and strengthening activities  prescribed by your caregiver, physical therapist, or athletic trainer. Use a heat pack or a warm water soak. SEEK MEDICAL CARE IF:   Symptoms get worse or do not improve in 2 to 4 weeks, despite treatment.  You develop numbness or weakness in either leg.  You lose bowel or bladder function.  Any of the following occur after surgery: fever, increased pain, swelling, redness, drainage of fluids, or bleeding in the affected area.  New, unexplained symptoms develop. (Drugs used in treatment may produce side effects.) EXERCISES  RANGE OF MOTION (ROM) AND STRETCHING EXERCISES - Low Back Sprain Most people with lower back pain will find that their symptoms get worse with excessive bending forward (flexion) or arching at the lower back (extension). The exercises that will help resolve your symptoms will focus on the opposite motion.  Your physician, physical therapist or athletic trainer will help you determine which exercises will be most helpful to  resolve your lower back pain. Do not complete any exercises without first consulting with your caregiver. Discontinue any exercises which make your symptoms worse, until you speak to your caregiver. If you have pain, numbness or tingling which travels down into your buttocks, leg or foot, the goal of the therapy is for these symptoms to move closer to your back and eventually resolve. Sometimes, these leg symptoms will get better, but your lower back pain may worsen. This is often an indication of progress in your rehabilitation. Be very alert to any changes in your symptoms and the activities in which you participated in the 24 hours prior to the change. Sharing this information with your caregiver will allow him or her to most efficiently treat your condition. These exercises may help you when beginning to rehabilitate your injury. Your symptoms may resolve with or without further involvement from your physician, physical therapist or athletic trainer. While  completing these exercises, remember:   Restoring tissue flexibility helps normal motion to return to the joints. This allows healthier, less painful movement and activity.  An effective stretch should be held for at least 30 seconds.  A stretch should never be painful. You should only feel a gentle lengthening or release in the stretched tissue. FLEXION RANGE OF MOTION AND STRETCHING EXERCISES: STRETCH - Flexion, Single Knee to Chest   Lie on a firm bed or floor with both legs extended in front of you.  Keeping one leg in contact with the floor, bring your opposite knee to your chest. Hold your leg in place by either grabbing behind your thigh or at your knee.  Pull until you feel a gentle stretch in your low back. Hold __________ seconds.  Slowly release your grasp and repeat the exercise with the opposite side. Repeat __________ times. Complete this exercise __________ times per day.  STRETCH - Flexion, Double Knee to Chest  Lie on a firm bed or floor with both legs extended in front of you.  Keeping one leg in contact with the floor, bring your opposite knee to your chest.  Tense your stomach muscles to support your back and then lift your other knee to your chest. Hold your legs in place by either grabbing behind your thighs or at your knees.  Pull both knees toward your chest until you feel a gentle stretch in your low back. Hold __________ seconds.  Tense your stomach muscles and slowly return one leg at a time to the floor. Repeat __________ times. Complete this exercise __________ times per day.  STRETCH - Low Trunk Rotation  Lie on a firm bed or floor. Keeping your legs in front of you, bend your knees so they are both pointed toward the ceiling and your feet are flat on the floor.  Extend your arms out to the side. This will stabilize your upper body by keeping your shoulders in contact with the floor.  Gently and slowly drop both knees together to one side until you  feel a gentle stretch in your low back. Hold for __________ seconds.  Tense your stomach muscles to support your lower back as you bring your knees back to the starting position. Repeat the exercise to the other side. Repeat __________ times. Complete this exercise __________ times per day  EXTENSION RANGE OF MOTION AND FLEXIBILITY EXERCISES: STRETCH - Extension, Prone on Elbows   Lie on your stomach on the floor, a bed will be too soft. Place your palms about shoulder width apart and at the  height of your head.  Place your elbows under your shoulders. If this is too painful, stack pillows under your chest.  Allow your body to relax so that your hips drop lower and make contact more completely with the floor.  Hold this position for __________ seconds.  Slowly return to lying flat on the floor. Repeat __________ times. Complete this exercise __________ times per day.  RANGE OF MOTION - Extension, Prone Press Ups  Lie on your stomach on the floor, a bed will be too soft. Place your palms about shoulder width apart and at the height of your head.  Keeping your back as relaxed as possible, slowly straighten your elbows while keeping your hips on the floor. You may adjust the placement of your hands to maximize your comfort. As you gain motion, your hands will come more underneath your shoulders.  Hold this position __________ seconds.  Slowly return to lying flat on the floor. Repeat __________ times. Complete this exercise __________ times per day.  RANGE OF MOTION- Quadruped, Neutral Spine   Assume a hands and knees position on a firm surface. Keep your hands under your shoulders and your knees under your hips. You may place padding under your knees for comfort.  Drop your head and point your tailbone toward the ground below you. This will round out your lower back like an angry cat. Hold this position for __________ seconds.  Slowly lift your head and release your tail bone so that  your back sags into a large arch, like an old horse.  Hold this position for __________ seconds.  Repeat this until you feel limber in your low back.  Now, find your "sweet spot." This will be the most comfortable position somewhere between the two previous positions. This is your neutral spine. Once you have found this position, tense your stomach muscles to support your low back.  Hold this position for __________ seconds. Repeat __________ times. Complete this exercise __________ times per day.  STRENGTHENING EXERCISES - Low Back Sprain These exercises may help you when beginning to rehabilitate your injury. These exercises should be done near your "sweet spot." This is the neutral, low-back arch, somewhere between fully rounded and fully arched, that is your least painful position. When performed in this safe range of motion, these exercises can be used for people who have either a flexion or extension based injury. These exercises may resolve your symptoms with or without further involvement from your physician, physical therapist or athletic trainer. While completing these exercises, remember:   Muscles can gain both the endurance and the strength needed for everyday activities through controlled exercises.  Complete these exercises as instructed by your physician, physical therapist or athletic trainer. Increase the resistance and repetitions only as guided.  You may experience muscle soreness or fatigue, but the pain or discomfort you are trying to eliminate should never worsen during these exercises. If this pain does worsen, stop and make certain you are following the directions exactly. If the pain is still present after adjustments, discontinue the exercise until you can discuss the trouble with your caregiver. STRENGTHENING - Deep Abdominals, Pelvic Tilt   Lie on a firm bed or floor. Keeping your legs in front of you, bend your knees so they are both pointed toward the ceiling and  your feet are flat on the floor.  Tense your lower abdominal muscles to press your low back into the floor. This motion will rotate your pelvis so that your tail bone  is scooping upwards rather than pointing at your feet or into the floor. With a gentle tension and even breathing, hold this position for __________ seconds. Repeat __________ times. Complete this exercise __________ times per day.  STRENGTHENING - Abdominals, Crunches   Lie on a firm bed or floor. Keeping your legs in front of you, bend your knees so they are both pointed toward the ceiling and your feet are flat on the floor. Cross your arms over your chest.  Slightly tip your chin down without bending your neck.  Tense your abdominals and slowly lift your trunk high enough to just clear your shoulder blades. Lifting higher can put excessive stress on the lower back and does not further strengthen your abdominal muscles.  Control your return to the starting position. Repeat __________ times. Complete this exercise __________ times per day.  STRENGTHENING - Quadruped, Opposite UE/LE Lift   Assume a hands and knees position on a firm surface. Keep your hands under your shoulders and your knees under your hips. You may place padding under your knees for comfort.  Find your neutral spine and gently tense your abdominal muscles so that you can maintain this position. Your shoulders and hips should form a rectangle that is parallel with the floor and is not twisted.  Keeping your trunk steady, lift your right hand no higher than your shoulder and then your left leg no higher than your hip. Make sure you are not holding your breath. Hold this position for __________ seconds.  Continuing to keep your abdominal muscles tense and your back steady, slowly return to your starting position. Repeat with the opposite arm and leg. Repeat __________ times. Complete this exercise __________ times per day.  STRENGTHENING - Abdominals and  Quadriceps, Straight Leg Raise   Lie on a firm bed or floor with both legs extended in front of you.  Keeping one leg in contact with the floor, bend the other knee so that your foot can rest flat on the floor.  Find your neutral spine, and tense your abdominal muscles to maintain your spinal position throughout the exercise.  Slowly lift your straight leg off the floor about 6 inches for a count of 15, making sure to not hold your breath.  Still keeping your neutral spine, slowly lower your leg all the way to the floor. Repeat this exercise with each leg __________ times. Complete this exercise __________ times per day. POSTURE AND BODY MECHANICS CONSIDERATIONS - Low Back Sprain Keeping correct posture when sitting, standing or completing your activities will reduce the stress put on different body tissues, allowing injured tissues a chance to heal and limiting painful experiences. The following are general guidelines for improved posture. Your physician or physical therapist will provide you with any instructions specific to your needs. While reading these guidelines, remember:  The exercises prescribed by your provider will help you have the flexibility and strength to maintain correct postures.  The correct posture provides the best environment for your joints to work. All of your joints have less wear and tear when properly supported by a spine with good posture. This means you will experience a healthier, less painful body.  Correct posture must be practiced with all of your activities, especially prolonged sitting and standing. Correct posture is as important when doing repetitive low-stress activities (typing) as it is when doing a single heavy-load activity (lifting). RESTING POSITIONS Consider which positions are most painful for you when choosing a resting position. If you have pain  with flexion-based activities (sitting, bending, stooping, squatting), choose a position that allows  you to rest in a less flexed posture. You would want to avoid curling into a fetal position on your side. If your pain worsens with extension-based activities (prolonged standing, working overhead), avoid resting in an extended position such as sleeping on your stomach. Most people will find more comfort when they rest with their spine in a more neutral position, neither too rounded nor too arched. Lying on a non-sagging bed on your side with a pillow between your knees, or on your back with a pillow under your knees will often provide some relief. Keep in mind, being in any one position for a prolonged period of time, no matter how correct your posture, can still lead to stiffness. PROPER SITTING POSTURE In order to minimize stress and discomfort on your spine, you must sit with correct posture. Sitting with good posture should be effortless for a healthy body. Returning to good posture is a gradual process. Many people can work toward this most comfortably by using various supports until they have the flexibility and strength to maintain this posture on their own. When sitting with proper posture, your ears will fall over your shoulders and your shoulders will fall over your hips. You should use the back of the chair to support your upper back. Your lower back will be in a neutral position, just slightly arched. You may place a small pillow or folded towel at the base of your lower back for  support.  When working at a desk, create an environment that supports good, upright posture. Without extra support, muscles tire, which leads to excessive strain on joints and other tissues. Keep these recommendations in mind: CHAIR:  A chair should be able to slide under your desk when your back makes contact with the back of the chair. This allows you to work closely.  The chair's height should allow your eyes to be level with the upper part of your monitor and your hands to be slightly lower than your  elbows. BODY POSITION  Your feet should make contact with the floor. If this is not possible, use a foot rest.  Keep your ears over your shoulders. This will reduce stress on your neck and low back. INCORRECT SITTING POSTURES  If you are feeling tired and unable to assume a healthy sitting posture, do not slouch or slump. This puts excessive strain on your back tissues, causing more damage and pain. Healthier options include:  Using more support, like a lumbar pillow.  Switching tasks to something that requires you to be upright or walking.  Talking a brief walk.  Lying down to rest in a neutral-spine position. PROLONGED STANDING WHILE SLIGHTLY LEANING FORWARD  When completing a task that requires you to lean forward while standing in one place for a long time, place either foot up on a stationary 2-4 inch high object to help maintain the best posture. When both feet are on the ground, the lower back tends to lose its slight inward curve. If this curve flattens (or becomes too large), then the back and your other joints will experience too much stress, tire more quickly, and can cause pain. CORRECT STANDING POSTURES Proper standing posture should be assumed with all daily activities, even if they only take a few moments, like when brushing your teeth. As in sitting, your ears should fall over your shoulders and your shoulders should fall over your hips. You should keep a slight  tension in your abdominal muscles to brace your spine. Your tailbone should point down to the ground, not behind your body, resulting in an over-extended swayback posture.  INCORRECT STANDING POSTURES  Common incorrect standing postures include a forward head, locked knees and/or an excessive swayback. WALKING Walk with an upright posture. Your ears, shoulders and hips should all line-up. PROLONGED ACTIVITY IN A FLEXED POSITION When completing a task that requires you to bend forward at your waist or lean over a low  surface, try to find a way to stabilize 3 out of 4 of your limbs. You can place a hand or elbow on your thigh or rest a knee on the surface you are reaching across. This will provide you more stability, so that your muscles do not tire as quickly. By keeping your knees relaxed, or slightly bent, you will also reduce stress across your lower back. CORRECT LIFTING TECHNIQUES DO :  Assume a wide stance. This will provide you more stability and the opportunity to get as close as possible to the object which you are lifting.  Tense your abdominals to brace your spine. Bend at the knees and hips. Keeping your back locked in a neutral-spine position, lift using your leg muscles. Lift with your legs, keeping your back straight.  Test the weight of unknown objects before attempting to lift them.  Try to keep your elbows locked down at your sides in order get the best strength from your shoulders when carrying an object.  Always ask for help when lifting heavy or awkward objects. INCORRECT LIFTING TECHNIQUES DO NOT:   Lock your knees when lifting, even if it is a small object.  Bend and twist. Pivot at your feet or move your feet when needing to change directions.  Assume that you can safely pick up even a paperclip without proper posture.   This information is not intended to replace advice given to you by your health care provider. Make sure you discuss any questions you have with your health care provider.   Document Released: 06/23/2005 Document Revised: 07/14/2014 Document Reviewed: 10/05/2008 Elsevier Interactive Patient Education 2016 [employer]. Fall Prevention in the Home  Falls can cause injuries and can affect people from all age groups. There are many simple things that you can do to make your home safe and to help prevent falls. WHAT CAN I DO ON THE OUTSIDE OF MY HOME?  Regularly repair the edges of walkways and driveways and fix any cracks.  Remove high doorway  thresholds.  Trim any shrubbery on the main path into your home.  Use bright outdoor lighting.  Clear walkways of debris and clutter, including tools and rocks.  Regularly check that handrails are securely fastened and in good repair. Both sides of any steps should have handrails.  Install guardrails along the edges of any raised decks or porches.  Have leaves, snow, and ice cleared regularly.  Use sand or salt on walkways during winter months.  In the garage, clean up any spills right away, including grease or oil spills. WHAT CAN I DO IN THE BATHROOM?  Use night lights.  Install grab bars by the toilet and in the tub and shower. Do not use towel bars as grab bars.  Use non-skid mats or decals on the floor of the tub or shower.  If you need to sit down while you are in the shower, use a plastic, non-slip stool.Marland Kitchen  Keep the floor dry. Immediately clean up any water that spills  on the floor.  Remove soap buildup in the tub or shower on a regular basis.  Attach bath mats securely with double-sided non-slip rug tape.  Remove throw rugs and other tripping hazards from the floor. WHAT CAN I DO IN THE BEDROOM?  Use night lights.  Make sure that a bedside light is easy to reach.  Do not use oversized bedding that drapes onto the floor.  Have a firm chair that has side arms to use for getting dressed.  Remove throw rugs and other tripping hazards from the floor. WHAT CAN I DO IN THE KITCHEN?   Clean up any spills right away.  Avoid walking on wet floors.  Place frequently used items in easy-to-reach places.  If you need to reach for something above you, use a sturdy step stool that has a grab bar.  Keep electrical cables out of the way.  Do not use floor polish or wax that makes floors slippery. If you have to use wax, make sure that it is non-skid floor wax.  Remove throw rugs and other tripping hazards from the floor. WHAT CAN I DO IN THE STAIRWAYS?  Do not  leave any items on the stairs.  Make sure that there are handrails on both sides of the stairs. Fix handrails that are broken or loose. Make sure that handrails are as long as the stairways.  Check any carpeting to make sure that it is firmly attached to the stairs. Fix any carpet that is loose or worn.  Avoid having throw rugs at the top or bottom of stairways, or secure the rugs with carpet tape to prevent them from moving.  Make sure that you have a light switch at the top of the stairs and the bottom of the stairs. If you do not have them, have them installed. WHAT ARE SOME OTHER FALL PREVENTION TIPS?  Wear closed-toe shoes that fit well and support your feet. Wear shoes that have rubber soles or low heels.  When you use a stepladder, make sure that it is completely opened and that the sides are firmly locked. Have someone hold the ladder while you are using it. Do not climb a closed stepladder.  Add color or contrast paint or tape to grab bars and handrails in your home. Place contrasting color strips on the first and last steps.  Use mobility aids as needed, such as canes, walkers, scooters, and crutches.  Turn on lights if it is dark. Replace any light bulbs that burn out.  Set up furniture so that there are clear paths. Keep the furniture in the same spot.  Fix any uneven floor surfaces.  Choose a carpet design that does not hide the edge of steps of a stairway.  Be aware of any and all pets.  Review your medicines with your healthcare provider. Some medicines can cause dizziness or changes in blood pressure, which increase your risk of falling. Talk with your health care provider about other ways that you can decrease your risk of falls. This may include working with a physical therapist or trainer to improve your strength, balance, and endurance.   This information is not intended to replace advice given to you by your health care provider. Make sure you discuss any  questions you have with your health care provider.   Document Released: 06/13/2002 Document Revised: 11/07/2014 Document Reviewed: 07/28/2014 Elsevier Interactive Patient Education [employer]

## 2015-12-10 ENCOUNTER — Ambulatory Visit
Admission: RE | Admit: 2015-12-10 | Discharge: 2015-12-10 | Disposition: A | Discharge status: Home / Self Care | Payer: Medicare Other | Source: Ambulatory Visit | Attending: Family Medicine | Admitting: Family Medicine

## 2015-12-10 ENCOUNTER — Other Ambulatory Visit: Payer: Self-pay | Admitting: Family Medicine

## 2015-12-10 DIAGNOSIS — M5489 Other dorsalgia: Secondary | ICD-10-CM

## 2015-12-10 DIAGNOSIS — M5134 Other intervertebral disc degeneration, thoracic region: Secondary | ICD-10-CM | POA: Diagnosis not present

## 2015-12-10 DIAGNOSIS — M549 Dorsalgia, unspecified: Secondary | ICD-10-CM | POA: Diagnosis present

## 2016-03-07 ENCOUNTER — Emergency Department: Payer: Medicare Other

## 2016-03-07 ENCOUNTER — Observation Stay
Admission: EM | Admit: 2016-03-07 | Discharge: 2016-03-08 | Disposition: A | Discharge status: Home / Self Care | Payer: Medicare Other | Attending: Internal Medicine | Admitting: Internal Medicine

## 2016-03-07 ENCOUNTER — Encounter: Payer: Self-pay | Admitting: Emergency Medicine

## 2016-03-07 DIAGNOSIS — R2681 Unsteadiness on feet: Secondary | ICD-10-CM

## 2016-03-07 DIAGNOSIS — Z8673 Personal history of transient ischemic attack (TIA), and cerebral infarction without residual deficits: Secondary | ICD-10-CM | POA: Diagnosis not present

## 2016-03-07 DIAGNOSIS — I739 Peripheral vascular disease, unspecified: Secondary | ICD-10-CM | POA: Insufficient documentation

## 2016-03-07 DIAGNOSIS — E785 Hyperlipidemia, unspecified: Secondary | ICD-10-CM | POA: Diagnosis not present

## 2016-03-07 DIAGNOSIS — G459 Transient cerebral ischemic attack, unspecified: Secondary | ICD-10-CM | POA: Diagnosis not present

## 2016-03-07 DIAGNOSIS — Y93E1 Activity, personal bathing and showering: Secondary | ICD-10-CM | POA: Diagnosis not present

## 2016-03-07 DIAGNOSIS — W182XXA Fall in (into) shower or empty bathtub, initial encounter: Secondary | ICD-10-CM | POA: Diagnosis not present

## 2016-03-07 DIAGNOSIS — M1990 Unspecified osteoarthritis, unspecified site: Secondary | ICD-10-CM | POA: Insufficient documentation

## 2016-03-07 DIAGNOSIS — S20211A Contusion of right front wall of thorax, initial encounter: Secondary | ICD-10-CM | POA: Diagnosis not present

## 2016-03-07 DIAGNOSIS — Z87891 Personal history of nicotine dependence: Secondary | ICD-10-CM | POA: Insufficient documentation

## 2016-03-07 DIAGNOSIS — W19XXXA Unspecified fall, initial encounter: Secondary | ICD-10-CM

## 2016-03-07 DIAGNOSIS — I351 Nonrheumatic aortic (valve) insufficiency: Secondary | ICD-10-CM | POA: Insufficient documentation

## 2016-03-07 DIAGNOSIS — I10 Essential (primary) hypertension: Secondary | ICD-10-CM | POA: Diagnosis present

## 2016-03-07 DIAGNOSIS — I672 Cerebral atherosclerosis: Secondary | ICD-10-CM | POA: Insufficient documentation

## 2016-03-07 DIAGNOSIS — S61217A Laceration without foreign body of left little finger without damage to nail, initial encounter: Secondary | ICD-10-CM | POA: Diagnosis not present

## 2016-03-07 DIAGNOSIS — R269 Unspecified abnormalities of gait and mobility: Secondary | ICD-10-CM | POA: Insufficient documentation

## 2016-03-07 DIAGNOSIS — I6523 Occlusion and stenosis of bilateral carotid arteries: Secondary | ICD-10-CM | POA: Diagnosis not present

## 2016-03-07 DIAGNOSIS — M419 Scoliosis, unspecified: Secondary | ICD-10-CM | POA: Diagnosis not present

## 2016-03-07 DIAGNOSIS — E1165 Type 2 diabetes mellitus with hyperglycemia: Secondary | ICD-10-CM | POA: Insufficient documentation

## 2016-03-07 DIAGNOSIS — M47814 Spondylosis without myelopathy or radiculopathy, thoracic region: Secondary | ICD-10-CM | POA: Diagnosis not present

## 2016-03-07 DIAGNOSIS — R52 Pain, unspecified: Secondary | ICD-10-CM

## 2016-03-07 HISTORY — DX: Cerebral infarction, unspecified: I63.9

## 2016-03-07 LAB — CBC WITH DIFFERENTIAL/PLATELET
BASOS ABS: 0 10*3/uL (ref 0–0.1)
BASOS PCT: 1 %
Eosinophils Absolute: 0.1 10*3/uL (ref 0–0.7)
Eosinophils Relative: 1 %
HEMATOCRIT: 40 % (ref 40.0–52.0)
HEMOGLOBIN: 13.9 g/dL (ref 13.0–18.0)
Lymphocytes Relative: 21 %
Lymphs Abs: 1.6 10*3/uL (ref 1.0–3.6)
MCH: 30.4 pg (ref 26.0–34.0)
MCHC: 34.8 g/dL (ref 32.0–36.0)
MCV: 87.5 fL (ref 80.0–100.0)
MONO ABS: 1.1 10*3/uL — AB (ref 0.2–1.0)
Monocytes Relative: 15 %
NEUTROS ABS: 4.8 10*3/uL (ref 1.4–6.5)
NEUTROS PCT: 62 %
Platelets: 214 10*3/uL (ref 150–440)
RBC: 4.57 MIL/uL (ref 4.40–5.90)
RDW: 13.6 % (ref 11.5–14.5)
WBC: 7.6 10*3/uL (ref 3.8–10.6)

## 2016-03-07 LAB — COMPREHENSIVE METABOLIC PANEL
ALBUMIN: 4.3 g/dL (ref 3.5–5.0)
ALT: 14 U/L — ABNORMAL LOW (ref 17–63)
AST: 22 U/L (ref 15–41)
Alkaline Phosphatase: 77 U/L (ref 38–126)
Anion gap: 8 (ref 5–15)
BILIRUBIN TOTAL: 0.4 mg/dL (ref 0.3–1.2)
BUN: 22 mg/dL — AB (ref 6–20)
CO2: 26 mmol/L (ref 22–32)
Calcium: 9.6 mg/dL (ref 8.9–10.3)
Chloride: 100 mmol/L — ABNORMAL LOW (ref 101–111)
Creatinine, Ser: 1.19 mg/dL (ref 0.61–1.24)
GFR calc Af Amer: >60 mL/min (ref >60)
GFR calc non Af Amer: 53 mL/min — ABNORMAL LOW (ref >60)
GLUCOSE: 283 mg/dL — AB (ref 65–99)
POTASSIUM: 4.2 mmol/L (ref 3.5–5.1)
SODIUM: 134 mmol/L — AB (ref 135–145)
TOTAL PROTEIN: 7.7 g/dL (ref 6.5–8.1)

## 2016-03-07 LAB — PROTIME-INR
INR: 0.91
Prothrombin Time: 12.2 seconds (ref 11.4–15.2)

## 2016-03-07 LAB — GLUCOSE, CAPILLARY: GLUCOSE-CAPILLARY: 215 mg/dL — AB (ref 65–99)

## 2016-03-07 LAB — TROPONIN I: Troponin I: <0.03 ng/mL (ref <0.03)

## 2016-03-07 MED ORDER — ENALAPRIL MALEATE 10 MG PO TABS
20.0000 mg | ORAL_TABLET | Freq: Two times a day (BID) | ORAL | Status: DC
  Administered 2016-03-08 (×2): 20 mg via ORAL
  Filled 2016-03-07 (×2): qty 2

## 2016-03-07 MED ORDER — ENOXAPARIN SODIUM 40 MG/0.4ML ~~LOC~~ SOLN
40.0000 mg | Freq: Every day | SUBCUTANEOUS | Status: DC
  Administered 2016-03-08: 40 mg via SUBCUTANEOUS
  Filled 2016-03-07: qty 0.4

## 2016-03-07 MED ORDER — ACETAMINOPHEN 325 MG PO TABS: 650.0000 mg | ORAL_TABLET | ORAL | Status: DC | PRN

## 2016-03-07 MED ORDER — SENNOSIDES-DOCUSATE SODIUM 8.6-50 MG PO TABS: 1.0000 | ORAL_TABLET | Freq: Every evening | ORAL | Status: DC | PRN

## 2016-03-07 MED ORDER — SODIUM CHLORIDE 0.9 % IV SOLN
INTRAVENOUS | Status: DC
  Administered 2016-03-08: 01:00:00 via INTRAVENOUS

## 2016-03-07 MED ORDER — ADULT MULTIVITAMIN W/MINERALS CH
1.0000 | ORAL_TABLET | Freq: Every day | ORAL | Status: DC
  Administered 2016-03-08: 1 via ORAL
  Filled 2016-03-07: qty 1

## 2016-03-07 MED ORDER — ASPIRIN 81 MG PO CHEW
81.0000 mg | CHEWABLE_TABLET | Freq: Every day | ORAL | Status: DC
  Administered 2016-03-08: 81 mg via ORAL
  Filled 2016-03-07: qty 1

## 2016-03-07 MED ORDER — MELOXICAM 7.5 MG PO TABS
7.5000 mg | ORAL_TABLET | Freq: Every day | ORAL | Status: DC
  Administered 2016-03-08: 7.5 mg via ORAL
  Filled 2016-03-07: qty 1

## 2016-03-07 MED ORDER — STROKE: EARLY STAGES OF RECOVERY BOOK
Freq: Once | Status: AC
  Administered 2016-03-08

## 2016-03-07 MED ORDER — INSULIN ASPART 100 UNIT/ML ~~LOC~~ SOLN
0.0000 [IU] | SUBCUTANEOUS | Status: DC
  Administered 2016-03-08: 5 [IU] via SUBCUTANEOUS
  Administered 2016-03-08: 2 [IU] via SUBCUTANEOUS
  Administered 2016-03-08: 5 [IU] via SUBCUTANEOUS
  Administered 2016-03-08: 3 [IU] via SUBCUTANEOUS
  Filled 2016-03-07: qty 2
  Filled 2016-03-07 (×2): qty 5
  Filled 2016-03-07: qty 3

## 2016-03-07 MED ORDER — TETANUS-DIPHTH-ACELL PERTUSSIS 5-2.5-18.5 LF-MCG/0.5 IM SUSP
0.5000 mL | Freq: Once | INTRAMUSCULAR | Status: AC
  Administered 2016-03-07: 0.5 mL via INTRAMUSCULAR
  Filled 2016-03-07: qty 0.5

## 2016-03-07 MED ORDER — DOCUSATE SODIUM 100 MG PO CAPS: 100.0000 mg | ORAL_CAPSULE | Freq: Every day | ORAL | Status: DC | PRN

## 2016-03-07 MED ORDER — VITAMIN B-12 1000 MCG PO TABS
1000.0000 ug | ORAL_TABLET | Freq: Every day | ORAL | Status: DC
  Administered 2016-03-08: 1000 ug via ORAL
  Filled 2016-03-07: qty 1

## 2016-03-07 MED ORDER — ACETAMINOPHEN 650 MG RE SUPP: 650.0000 mg | RECTAL | Status: DC | PRN

## 2016-03-07 MED ORDER — METOPROLOL SUCCINATE ER 50 MG PO TB24
50.0000 mg | ORAL_TABLET | Freq: Every day | ORAL | Status: DC
  Administered 2016-03-08: 50 mg via ORAL
  Filled 2016-03-07: qty 1

## 2016-03-07 MED ORDER — SODIUM CHLORIDE 0.9 % IV BOLUS (SEPSIS)
500.0000 mL | INTRAVENOUS | Status: AC
Start: 2016-03-07 — End: 2016-03-07
  Administered 2016-03-07: 500 mL via INTRAVENOUS

## 2016-03-07 MED ORDER — CLOPIDOGREL BISULFATE 75 MG PO TABS
75.0000 mg | ORAL_TABLET | Freq: Every day | ORAL | Status: DC
  Administered 2016-03-08: 75 mg via ORAL
  Filled 2016-03-07: qty 1

## 2016-03-07 MED ORDER — TRAMADOL HCL 50 MG PO TABS: 50.0000 mg | ORAL_TABLET | Freq: Four times a day (QID) | ORAL | Status: DC | PRN

## 2016-03-07 MED ORDER — PRAVASTATIN SODIUM 40 MG PO TABS
40.0000 mg | ORAL_TABLET | Freq: Every day | ORAL | Status: DC
  Administered 2016-03-08: 40 mg via ORAL
  Filled 2016-03-07: qty 1

## 2016-03-07 NOTE — ED Provider Notes (Signed)
[facility] Emergency Department Provider Note  ____________________________________________   First MD Initiated Contact with Patient 03/07/16 1927     (approximate)  I have reviewed the triage vital signs and the nursing notes.   HISTORY  Chief Complaint Fall  Patient's primary language is Urdu but he understands and speaks a moderate amount of [place].  Patient's son is at bedside and assisting with interpretation.  HPI Christopher [name] is a 82 y.o. male with history of prior CVA/TIA and who takes Plavix presents for evaluation after a mechanical fall at home.  He states that he was in the shower and was getting out when he slipped and fell, striking the right posterior ribs as well as his left small finger causing a laceration.  He denies hitting his head and denies headache and neck pain.  He and his son are very concerned because of his history of CVA and the fact that the patient has had significant gait instability issues that have worsened over the last 2 days.  He was seen about a week ago by Dr. Malvin Johns who ordered an outpatient MRI studies.  However the son reports that it got acutely worse 2 days ago and that he has had to assist his father with ambulation since that time.  He believes that is why he fell at home.  He is very concerned about the ambulation difficulties and the possibility of a new stroke and is very persistent in asking for full evaluation of the patient's entire body including MRIs.  The patient denies chest pain, shortness of breath, abdominal pain, nausea, vomiting.  He does state that he is having a lot of trouble with ambulation.  He currently has no weakness in his arms or his legs though he does have gait instability.  He has pain to his right flank where there is a contusion on his posterior ribs.  Son reports that the gait instability is severe, nothing makes better nor worse.  Of note, review of the medical records shows a very  similar visit several months ago including even the rib contusions.I also verified the information as described above in the note by Dr. Malvin Johns and care everywhere.  He does mention that the gait instability has been present for a while but the son indicates it is been worse over the last 2 days since seeing Dr. Malvin Johns.  Due to the language barrier, care is being transferred from Dr. Malvin Johns to Dr. Sherryll Burger who speaks Urdu as well.  Past Medical History:  Diagnosis Date  . Arthritis   . Ataxia   . Diabetes mellitus without complication (HCC)    controlled  . Dyslipidemia   . Hypertension    somewhat controlled  . Stroke (HCC)   . TIA (transient ischemic attack)     Patient Active Problem List   Diagnosis Date Noted  . Acute CVA (cerebrovascular accident) (HCC) 07/03/2015    History reviewed. No pertinent surgical history.  Prior to Admission medications   Medication Sig Start Date End Date Taking? Authorizing Provider  aspirin 81 MG chewable tablet Chew 1 tablet (81 mg total) by mouth daily. 07/05/15   Katha Hamming, MD  clopidogrel (PLAVIX) 75 MG tablet Take 1 tablet (75 mg total) by mouth daily. 07/05/15   Katha Hamming, MD  docusate sodium (COLACE) 100 MG capsule Take 1 capsule (100 mg total) by mouth daily as needed. 10/01/15 09/30/16  Emily Filbert, MD  enalapril (VASOTEC) 20 MG tablet Take 1 tablet  by mouth 2 (two) times daily. 06/21/15   Historical Provider, MD  glipiZIDE (GLUCOTROL XL) 5 MG 24 hr tablet Take 1 tablet by mouth daily. 06/21/15   Historical Provider, MD  meloxicam (MOBIC) 7.5 MG tablet Take 1 tablet by mouth daily. 06/21/15   Historical Provider, MD  metFORMIN (GLUCOPHAGE) 500 MG tablet Take 2 tablets by mouth 2 (two) times daily. 04/23/15   Historical Provider, MD  metoprolol succinate (TOPROL-XL) 50 MG 24 hr tablet Take 1 tablet by mouth daily. 06/21/15   Historical Provider, MD  Multiple Vitamin (MULTI-VITAMINS) TABS Take 1 tablet by mouth daily.     Historical Provider, MD  pravastatin (PRAVACHOL) 40 MG tablet Take 1 tablet (40 mg total) by mouth daily. 07/05/15   Katha Hamming, MD  traMADol (ULTRAM) 50 MG tablet Take 1 tablet (50 mg total) by mouth every 6 (six) hours as needed. 10/01/15 09/30/16  Emily Filbert, MD  triamcinolone ointment (KENALOG) 0.1 % Apply 1 application topically 2 (two) times daily.    Historical Provider, MD  vitamin B-12 (CYANOCOBALAMIN) 1000 MCG tablet Take 1,000 mcg by mouth daily.    Historical Provider, MD    Allergies Hydrochlorothiazide and Penicillins  History reviewed. No pertinent family history.  Social History Social History  Substance Use Topics  . Smoking status: Former Games developer  . Smokeless tobacco: Former Neurosurgeon  . Alcohol use No    Review of Systems Constitutional: No fever/chills Eyes: No visual changes. ENT: No sore throat. Cardiovascular: Denies chest pain. Respiratory: Denies shortness of breath. Gastrointestinal: No abdominal pain.  No nausea, no vomiting.  No diarrhea.  No constipation. Genitourinary: Negative for dysuria. Musculoskeletal: Negative for back pain. Skin: Negative for rash.  Laceration to left pinky finger, palmar aspect. Neurological: Increasing gait instability.  Increasing (but somewhat intermittent) weakness of LLE.  10-point ROS otherwise negative.  ____________________________________________   PHYSICAL EXAM:  VITAL SIGNS: ED Triage Vitals  Enc Vitals Group     BP 03/07/16 1901 (!) 160/95     Pulse Rate 03/07/16 1901 99     Resp 03/07/16 1901 (!) 22     Temp 03/07/16 1901 98.1 F (36.7 C)     Temp Source 03/07/16 1901 Oral     SpO2 03/07/16 1901 97 %     Weight 03/07/16 1902 160 lb (72.6 kg)     Height 03/07/16 1902 5\' 7"  (1.702 m)     Head Circumference --      Peak Flow --      Pain Score 03/07/16 1902 6     Pain Loc --      Pain Edu? --      Excl. in GC? --     Constitutional: Alert and oriented. Well appearing and in no  acute distress. Eyes: Conjunctivae are normal. PERRL. EOMI. Head: Atraumatic. Nose: No congestion/rhinnorhea. Mouth/Throat: Mucous membranes are moist.  Oropharynx non-erythematous. Neck: No stridor.  No meningeal signs.  No cervical spine tenderness to palpation. Cardiovascular: Normal rate, regular rhythm. Good peripheral circulation. Grossly normal heart sounds. Respiratory: Normal respiratory effort.  No retractions. Lungs CTAB. Gastrointestinal: Soft and nontender. No distention.  Musculoskeletal: No lower extremity tenderness nor edema. No gross deformities of extremities. Neurologic:  Normal speech and language. No gross cranial nerve deficits.  No gross focal neurologic deficits are appreciated with 5/5 strength with bilateral biceps flexion and extension and bilateral hip flexion and extension. Skin:  Skin is warm, dry. <1 cm superficial laceration to palmar aspect of left pinky finger  just distal to the PIP joint.  Does not need sutures. Psychiatric: Mood and affect are normal. Speech and behavior are normal.  ____________________________________________   LABS (all labs ordered are listed, but only abnormal results are displayed)  Labs Reviewed  CBC WITH DIFFERENTIAL/PLATELET - Abnormal; Notable for the following:       Result Value   Monocytes Absolute 1.1 (*)    All other components within normal limits  COMPREHENSIVE METABOLIC PANEL  TROPONIN I  PROTIME-INR   ____________________________________________  EKG  ED ECG REPORT I, Windel Keziah, the attending physician, personally viewed and interpreted this ECG.  Date: 03/07/2016 EKG Time: 18:59 Rate: 101 Rhythm: borderline sinus tachycardia QRS Axis: normal Intervals: normal ST/T Wave abnormalities: Non-specific ST segment / T-wave changes, but no evidence of acute ischemia. Conduction Disturbances: none Narrative Interpretation: unremarkable  ____________________________________________  RADIOLOGY   Dg  Chest 2 View  Result Date: 03/07/2016 CLINICAL DATA:  82 y/o M; status post fall in shower. No loss of consciousness. EXAM: CHEST  2 VIEW COMPARISON:  12/10/2015 chest radiograph. FINDINGS: Stable cardiomediastinal silhouette given projection and technique. Clear lungs. No pneumothorax. No pleural effusion. Aortic arch calcifications. Moderate leftward curvature of the upper thoracic spine and multilevel degenerative changes is stable. Possible meniscal right sixth and seventh posterior rib fractures. IMPRESSION: 1. No acute cardiopulmonary process is identified. 2. Possible nondisplaced right sixth and seventh posterior rib fractures. Consider rib radiographs to assess for additional fractures. Electronically Signed   By: Mitzi Hansen M.D.   On: 03/07/2016 20:18   Ct Head Wo Contrast  Result Date: 03/07/2016 CLINICAL DATA:  82 year old male with ataxia for 2 days and recent fall. Currently on Plavix. EXAM: CT HEAD WITHOUT CONTRAST TECHNIQUE: Contiguous axial images were obtained from the base of the skull through the vertex without intravenous contrast. COMPARISON:  10/01/2015 and prior CTs FINDINGS: Brain: No evidence of acute infarction, hemorrhage, hydrocephalus, extra-axial collection or mass lesion/mass effect. Moderate atrophy and chronic small-vessel white matter ischemic changes again noted. Vascular: Intracranial atherosclerosis. Skull: Unremarkable. Sinuses/Orbits: Visualized portions unremarkable. Other: None IMPRESSION: No evidence of acute intracranial abnormality. Atrophy and chronic small-vessel white matter ischemic changes. Electronically Signed   By: Harmon Pier M.D.   On: 03/07/2016 20:22   Dg Finger Little Left  Result Date: 03/07/2016 CLINICAL DATA:  Slip and fall injury with a cut to the left fifth finger. EXAM: LEFT LITTLE FINGER 2+V COMPARISON:  None. FINDINGS: Soft tissue swelling around the distal left fifth finger. No evidence of acute fracture or dislocation. No  radiopaque soft tissue foreign bodies. Degenerative changes in metacarpal phalangeal and interphalangeal joints with slight erosion at the distal aspect of the middle phalanx suggesting erosive osteoarthritis. IMPRESSION: Soft tissue swelling. No acute bony abnormalities. 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 Electronically Signed   By: Burman Nieves M.D.   On: 03/07/2016 20:13    ____________________________________________   PROCEDURES  Procedure(s) performed:   Procedures   Critical Care performed: No ____________________________________________   INITIAL IMPRESSION / ASSESSMENT AND PLAN / ED COURSE  Pertinent labs & imaging results that were available during my care of the patient were reviewed by me and considered in my medical decision making (see chart for details).  The possibility of CVA is certainly present although TIA may be more likely.  The patient has had a workup about a year ago with normal carotids and is currently taking Plavix.  However the son is extremely insistent that the gait instability is getting worse  and he has very concerned that the patient is having a CVA.  Given the increasing gait instability and increasing falls (he had a fall about 5 months ago that was similar but getting worse), I will evaluate the patient for acute CVA with a head CT as well as plain films of the chest and left pinky finger to look for acute injury.  I will check basic labs and I anticipate admission for TIA workup.  I explained to the patient that the MRI may not happen tonight.  I suspect that the patient will need physical therapy and occupational therapy evaluation as well given the worsening gait instability. Giving small fluid bolus for hyperglycemia (patient has not taken his metformin nor glipizide today).    Transferring ED care to Dr. Lenard LancePaduchowski to follow up scans/imaging and admit for TIA/CVA.  Discussed case by phone with Dr. Emmit PomfretHugelmeyer to give  her advance notice.  There is no indication for sutures on his finger laceration.  His skin is very thin and it will heal best by secondary intention.  We will put bacitracin on it and cover with gauze.  Ordered dedicated rib films to further evaluate possible rib fractures. ____________________________________________  FINAL CLINICAL IMPRESSION(S) / ED DIAGNOSES  Final diagnoses:  Fall, initial encounter  Gait instability  Laceration of fifth finger of left hand, initial encounter  Rib contusion, right, initial encounter  Type 2 diabetes mellitus with hyperglycemia, without long-term current use of insulin (HCC)  Essential hypertension     MEDICATIONS GIVEN DURING THIS VISIT:  Medications  Tdap (BOOSTRIX) injection 0.5 mL (0.5 mLs Intramuscular Given 03/07/16 2023)  sodium chloride 0.9 % bolus 500 mL (500 mLs Intravenous New Bag/Given 03/07/16 2022)     NEW OUTPATIENT MEDICATIONS STARTED DURING THIS VISIT:  New Prescriptions   No medications on file      Note:  This document was prepared using Dragon voice recognition software and may include unintentional dictation errors.    Loleta Roseory Giuliano Preece, MD 03/07/16 2042

## 2016-03-07 NOTE — ED Triage Notes (Signed)
Pt arrived via EMS from home. Pt was coming out of the shower tonight when he slipped and fell on to his right side and also cut his left 5th finger on the door.  Pt denies any LOC.  Pt has hx of CVA as well. Lives at home with family. CBG was 336 with EMS. Pt has hx of DM.

## 2016-03-07 NOTE — H&P (Signed)
[facility] - [place] @ [facility] Admission History and Physical [employer], D.O.  ---------------------------------------------------------------------------------------------------------------------   PATIENT NAME: Christopher [name] MR#: [id number] DATE OF BIRTH: [birth year] DATE OF ADMISSION: 03/07/2016 PRIMARY CARE PHYSICIAN: No PCP Per Patient  REQUESTING/REFERRING PHYSICIAN: ED Dr. York Cerise  CHIEF COMPLAINT: Chief Complaint  Patient presents with  . Fall    HISTORY OF PRESENT ILLNESS: Please note the majority of the history was obtained from the patient's son who is translating from [place].  The patient speaks and understands English but the patient's son is speaking for his father.  Christopher [name] is a 82 y.o. male with a known history of CVA on Plavix was in a usual state of health until 2 days ago when he began experiencing acutely worsening gait instability and weakness.   According to the patient's son he has had a gradual decline in his strength and stability over the past several weeks. He did see Dr. Malvin Johns last week as an outpatient who ordered MRIs for evaluation given his history of CVAs in the past. Of note the patient's care is being transferred from Dr. Malvin Johns to Dr. Clelia Croft secondary to language barrier.  Patient's symptoms of weakness and gait instability acutely worsened 2 days ago to the point where the patient's son has had to assist the patient in ambulating around the home which is not his baseline. Patient is reportedly very independent, lives at home with his wife. Today the patient fell getting out of the shower secondary to weakness. He landed on his right side and complained of right-sided chest pain. He denies loss of consciousness, head trauma. Patient denies any difficulty with speech, changes in vision, focal weakness.  Of note patient's son also is concerned about recent elevated blood glucose in the 300s to 400s. He has not been ill and has not been taking  any steroids recently.  Otherwise there has been no change in status. Patient has been taking medication as prescribed and there has been no recent change in medication or diet.  There has been no recent illness, travel or sick contacts.    Patient denies fevers/chills, weakness, dizziness, chest pain, shortness of breath, N/V/C/D, abdominal pain, dysuria/frequency, changes in mental status.   PAST MEDICAL HISTORY: Past Medical History:  Diagnosis Date  . Arthritis   . Ataxia   . Diabetes mellitus without complication (HCC)    controlled  . Dyslipidemia   . Hypertension    somewhat controlled  . Stroke (HCC)   . TIA (transient ischemic attack)       PAST SURGICAL HISTORY: History reviewed. No pertinent surgical history.    SOCIAL HISTORY: Social History  Substance Use Topics  . Smoking status: Former Games developer  . Smokeless tobacco: Former Neurosurgeon  . Alcohol use No      FAMILY HISTORY: History reviewed. No pertinent family history.   MEDICATIONS AT HOME: Prior to Admission medications   Medication Sig Start Date End Date Taking? Authorizing Provider  aspirin 81 MG chewable tablet Chew 1 tablet (81 mg total) by mouth daily. 07/05/15   Katha Hamming, MD  clopidogrel (PLAVIX) 75 MG tablet Take 1 tablet (75 mg total) by mouth daily. 07/05/15   Katha Hamming, MD  docusate sodium (COLACE) 100 MG capsule Take 1 capsule (100 mg total) by mouth daily as needed. 10/01/15 09/30/16  Emily Filbert, MD  enalapril (VASOTEC) 20 MG tablet Take 1 tablet by mouth 2 (two) times daily. 06/21/15   Historical Provider, MD  glipiZIDE (GLUCOTROL XL)  5 MG 24 hr tablet Take 1 tablet by mouth daily. 06/21/15   Historical Provider, MD  meloxicam (MOBIC) 7.5 MG tablet Take 1 tablet by mouth daily. 06/21/15   Historical Provider, MD  metFORMIN (GLUCOPHAGE) 500 MG tablet Take 2 tablets by mouth 2 (two) times daily. 04/23/15   Historical Provider, MD  metoprolol succinate (TOPROL-XL) 50 MG 24  hr tablet Take 1 tablet by mouth daily. 06/21/15   Historical Provider, MD  Multiple Vitamin (MULTI-VITAMINS) TABS Take 1 tablet by mouth daily.    Historical Provider, MD  pravastatin (PRAVACHOL) 40 MG tablet Take 1 tablet (40 mg total) by mouth daily. 07/05/15   Katha Hamming, MD  traMADol (ULTRAM) 50 MG tablet Take 1 tablet (50 mg total) by mouth every 6 (six) hours as needed. 10/01/15 09/30/16  Emily Filbert, MD  triamcinolone ointment (KENALOG) 0.1 % Apply 1 application topically 2 (two) times daily.    Historical Provider, MD  vitamin B-12 (CYANOCOBALAMIN) 1000 MCG tablet Take 1,000 mcg by mouth daily.    Historical Provider, MD      DRUG ALLERGIES: Allergies  Allergen Reactions  . Hydrochlorothiazide     Unknown reaction  . Penicillins      REVIEW OF SYSTEMS: CONSTITUTIONAL: No fever/chills, fatigue, weight gain/loss, headache. Positive weakness EYES: No blurry or double vision. ENT: No tinnitus, postnasal drip, redness or soreness of the oropharynx. RESPIRATORY: No cough, wheeze, hemoptysis, dyspnea. CARDIOVASCULAR: No chest pain, orthopnea, palpitations, syncope. GASTROINTESTINAL: No nausea, vomiting, constipation, diarrhea, abdominal pain, hematemesis, melena or hematochezia. GENITOURINARY: No dysuria or hematuria. ENDOCRINE: No polyuria or nocturia. No heat or cold intolerance. HEMATOLOGY: No anemia, bruising, bleeding. INTEGUMENTARY: No rashes, ulcers, lesions. MUSCULOSKELETAL: No arthritis, swelling, gout. NEUROLOGIC: No numbness, tingling.  No seizure-type activity. Positive gait ataxia. PSYCHIATRIC: No anxiety, depression, insomnia.  PHYSICAL EXAMINATION: VITAL SIGNS: Blood pressure (!) 160/90, pulse 86, temperature 98.1 F (36.7 C), temperature source Oral, resp. rate (!) 25, height 5\' 7"  (1.702 m), weight 72.6 kg (160 lb), SpO2 98 %.  GENERAL: 82 y.o.-year-old Asian male patient, well-developed, well-nourished lying in the bed in no acute distress.   Pleasant and cooperative.   HEENT: Head atraumatic, normocephalic. Pupils equal, round, reactive to light and accommodation. No scleral icterus. Extraocular muscles intact. Nares are patent. Oropharynx is clear. Mucus membranes moist. NECK: Supple, full range of motion. No JVD, no bruit heard. No thyroid enlargement, no tenderness, no cervical lymphadenopathy. CHEST: Normal breath sounds bilaterally. No wheezing, rales, rhonchi or crackles. No use of accessory muscles of respiration.  There is reproducible chest wall tenderness to the right lower chest. CARDIOVASCULAR: S1, S2 normal. No murmurs, rubs, or gallops. Cap refill <2 seconds. ABDOMEN: Soft, nontender, nondistended. No rebound, guarding, rigidity. Normoactive bowel sounds present in all four quadrants. No organomegaly or mass. EXTREMITIES: Full range of motion. No pedal edema, cyanosis, or clubbing. NEUROLOGIC: Cranial nerves II through XII are grossly intact with no focal sensorimotor deficit. Muscle strength 5/5 in all extremities. Sensation intact. Gait not checked. PSYCHIATRIC: The patient is alert and oriented x 3. Normal affect, mood, thought content. SKIN: Warm, dry, and intact without obvious rash, lesion, or ulcer.  LABORATORY PANEL:  CBC  Recent Labs Lab 03/07/16 2022  WBC 7.6  HGB 13.9  HCT 40.0  PLT 214   ----------------------------------------------------------------------------------------------------------------- Chemistries  Recent Labs Lab 03/07/16 2022  NA 134*  K 4.2  CL 100*  CO2 26  GLUCOSE 283*  BUN 22*  CREATININE 1.19  CALCIUM 9.6  AST 22  ALT 14*  ALKPHOS 77  BILITOT 0.4   ------------------------------------------------------------------------------------------------------------------ Cardiac Enzymes  Recent Labs Lab 03/07/16 2022  TROPONINI <0.03   ------------------------------------------------------------------------------------------------------------------  RADIOLOGY: Dg  Chest 2 View  Result Date: 03/07/2016 CLINICAL DATA:  82 y/o M; status post fall in shower. No loss of consciousness. EXAM: CHEST  2 VIEW COMPARISON:  12/10/2015 chest radiograph. FINDINGS: Stable cardiomediastinal silhouette given projection and technique. Clear lungs. No pneumothorax. No pleural effusion. Aortic arch calcifications. Moderate leftward curvature of the upper thoracic spine and multilevel degenerative changes is stable. Possible meniscal right sixth and seventh posterior rib fractures. IMPRESSION: 1. No acute cardiopulmonary process is identified. 2. Possible nondisplaced right sixth and seventh posterior rib fractures. Consider rib radiographs to assess for additional fractures. Electronically Signed   By: Mitzi HansenLance  Furusawa-Stratton M.D.   On: 03/07/2016 20:18   Dg Ribs Unilateral Right  Result Date: 03/07/2016 CLINICAL DATA:  Acute right rib pain following fall today. Initial encounter. EXAM: RIGHT RIBS - 2 VIEW COMPARISON:  Prior radiographs. FINDINGS: No fracture or other bone lesions are seen involving the ribs. IMPRESSION: Negative. Electronically Signed   By: Harmon PierJeffrey  Hu M.D.   On: 03/07/2016 21:11   Ct Head Wo Contrast  Result Date: 03/07/2016 CLINICAL DATA:  82 year old male with ataxia for 2 days and recent fall. Currently on Plavix. EXAM: CT HEAD WITHOUT CONTRAST TECHNIQUE: Contiguous axial images were obtained from the base of the skull through the vertex without intravenous contrast. COMPARISON:  10/01/2015 and prior CTs FINDINGS: Brain: No evidence of acute infarction, hemorrhage, hydrocephalus, extra-axial collection or mass lesion/mass effect. Moderate atrophy and chronic small-vessel white matter ischemic changes again noted. Vascular: Intracranial atherosclerosis. Skull: Unremarkable. Sinuses/Orbits: Visualized portions unremarkable. Other: None IMPRESSION: No evidence of acute intracranial abnormality. Atrophy and chronic small-vessel white matter ischemic changes.  Electronically Signed   By: Harmon PierJeffrey  Hu M.D.   On: 03/07/2016 20:22   Dg Finger Little Left  Result Date: 03/07/2016 CLINICAL DATA:  Slip and fall injury with a cut to the left fifth finger. EXAM: LEFT LITTLE FINGER 2+V COMPARISON:  None. FINDINGS: Soft tissue swelling around the distal left fifth finger. No evidence of acute fracture or dislocation. No radiopaque soft tissue foreign bodies. Degenerative changes in metacarpal phalangeal and interphalangeal joints with slight erosion at the distal aspect of the middle phalanx suggesting erosive osteoarthritis. IMPRESSION: Soft tissue swelling. No acute bony abnormalities. 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 Electronically Signed   By: Burman NievesWilliam  Stevens M.D.   On: 03/07/2016 20:13    EKG: Sinus tachycardia at 101 bpm with normal axis and  nonspecific ST and T wave changes  IMPRESSION AND PLAN:  This is a 82 y.o. male with a history of  TIA, CVA, hypertension, hyperlipidemia, diabetes and arthritis now being admitted with:  1. Weakness, gait instability. Rule out TIA/CVA -  - Admit telemetry observation for neuro workup including: - Studies: MRA/MRI, Echo, Carotids - Labs: CBC, BMP, Lipids, TFTs, A1C - Nursing: Neurochecks, O2, dysphagia screen, permissive hypertension.  - Consults: Neurology, PT/OT, S/S consults.  - Meds: Daily aspirin 81mg .   and continue Plavix 75 mg daily  - Fluids: IVNS@75cc /hr.   - Routine DVT Px: with Lovenox, SCDs, early ambulation  2. Diabetes we will hold his glipizide and metformin and cover with insulin sliding scale. Continue enalapril. 3. Hypertension-continue metoprolol 4. Osteoarthritis - continue tramadol and Luxiq and.   Diet: Heart healthy, carb controlled Fluids: IV normal saline Code Status: Full  All the records are  reviewed and case discussed with ED provider. Management plans discussed with the patient and/or family who express understanding and agree with plan of  care.   TOTAL TIME TAKING CARE OF THIS PATIENT: 60 minutes.   Dene Landsberg D.O. on 03/07/2016 at 9:50 PM Between 7am to 6pm - Pager - [phone] After 6pm go to [email or web address] - Biomedical engineer  [facility] Office [phone] CC: Primary care physician; No PCP Per Patient     Note: This dictation was prepared with Dragon dictation along with smaller phrase technology. Any transcriptional errors that result from this process are unintentional.

## 2016-03-07 NOTE — ED Notes (Signed)
Pt speaks Urdu.  Son at bedside with patient.  Pt does understand most English. Pt did not take medications today. Pt is alert at this time resting in bed.

## 2016-03-08 ENCOUNTER — Observation Stay (HOSPITAL_BASED_OUTPATIENT_CLINIC_OR_DEPARTMENT_OTHER)
Admit: 2016-03-08 | Discharge: 2016-03-08 | Disposition: A | Discharge status: Home / Self Care | Payer: Medicare Other | Attending: Family Medicine | Admitting: Family Medicine

## 2016-03-08 ENCOUNTER — Observation Stay: Payer: Medicare Other

## 2016-03-08 DIAGNOSIS — R2681 Unsteadiness on feet: Secondary | ICD-10-CM

## 2016-03-08 DIAGNOSIS — G609 Hereditary and idiopathic neuropathy, unspecified: Secondary | ICD-10-CM

## 2016-03-08 DIAGNOSIS — G459 Transient cerebral ischemic attack, unspecified: Secondary | ICD-10-CM | POA: Diagnosis not present

## 2016-03-08 LAB — TROPONIN I

## 2016-03-08 LAB — LIPID PANEL
CHOL/HDL RATIO: 3.6 ratio
Cholesterol: 167 mg/dL (ref 0–200)
HDL: 47 mg/dL (ref >40)
LDL CALC: 102 mg/dL — AB (ref 0–99)
TRIGLYCERIDES: 92 mg/dL (ref <150)
VLDL: 18 mg/dL (ref 0–40)

## 2016-03-08 LAB — GLUCOSE, CAPILLARY
GLUCOSE-CAPILLARY: 140 mg/dL — AB (ref 65–99)
GLUCOSE-CAPILLARY: 229 mg/dL — AB (ref 65–99)
Glucose-Capillary: 183 mg/dL — ABNORMAL HIGH (ref 65–99)

## 2016-03-08 LAB — HEMOGLOBIN A1C
HEMOGLOBIN A1C: 7.8 % — AB (ref 4.0–6.0)
Hgb A1c MFr Bld: 7.9 % — ABNORMAL HIGH (ref 4.0–6.0)

## 2016-03-08 LAB — URINALYSIS COMPLETE WITH MICROSCOPIC (ARMC ONLY)
BACTERIA UA: NONE SEEN
BILIRUBIN URINE: NEGATIVE
Glucose, UA: >500 mg/dL — AB
Hgb urine dipstick: NEGATIVE
KETONES UR: NEGATIVE mg/dL
Leukocytes, UA: NEGATIVE
Nitrite: NEGATIVE
Protein, ur: 100 mg/dL — AB
Specific Gravity, Urine: 1.02 (ref 1.005–1.030)
pH: 6 (ref 5.0–8.0)

## 2016-03-08 LAB — ECHOCARDIOGRAM COMPLETE
HEIGHTINCHES: 67 in
WEIGHTICAEL: 2312 [oz_av]

## 2016-03-08 NOTE — Consult Note (Signed)
Reason for Consult:falls Referring Physician: Dr. Elpidio AnisSudini  CC: increased falls/ unsteady gait   HPI: Christopher [name] is an 82 y.o. male with a known history of CVA on Plavix was in a usual state of health until 2 days ago when he began experiencing acutely worsening gait instability and weakness.   According to the patient's son he has had a gradual decline in his strength and stability over the past several weeks.   While being in hospital symptoms improved and pt is close to baseline.  Strength improved. Apparently these symptoms are predominantly seen when pt has b/l Leg swelling as per son.    No motor deficits.  Pt MRI brain no acute abnormality but has significant atherosclerotic dz   Past Medical History:  Diagnosis Date  . Arthritis   . Ataxia   . Diabetes mellitus without complication (HCC)    controlled  . Dyslipidemia   . Hypertension    somewhat controlled  . Stroke (HCC)   . TIA (transient ischemic attack)     History reviewed. No pertinent surgical history.  History reviewed. No pertinent family history.  Social History:  reports that he has quit smoking. He has quit using smokeless tobacco. He reports that he does not drink alcohol or use drugs.  Allergies  Allergen Reactions  . Hydrochlorothiazide     Unknown reaction  . Penicillins Other (See Comments)    Reaction unknown.     Medications: I have reviewed the patient's current medications.  ROS: History obtained from the patient and and son at bedside  General ROS: negative for - chills, fatigue, fever, night sweats, weight gain or weight loss Psychological ROS: negative for - behavioral disorder, hallucinations, memory difficulties, mood swings or suicidal ideation Ophthalmic ROS: negative for - blurry vision, double vision, eye pain or loss of vision ENT ROS: negative for - epistaxis, nasal discharge, oral lesions, sore throat, tinnitus or vertigo Allergy and Immunology ROS: negative for - hives or  itchy/watery eyes Hematological and Lymphatic ROS: negative for - bleeding problems, bruising or swollen lymph nodes Endocrine ROS: negative for - galactorrhea, hair pattern changes, polydipsia/polyuria or temperature intolerance Respiratory ROS: negative for - cough, hemoptysis, shortness of breath or wheezing Cardiovascular ROS: negative for - chest pain, dyspnea on exertion, edema or irregular heartbeat Gastrointestinal ROS: negative for - abdominal pain, diarrhea, hematemesis, nausea/vomiting or stool incontinence Genito-Urinary ROS: negative for - dysuria, hematuria, incontinence or urinary frequency/urgency Musculoskeletal ROS: negative for - joint swelling or muscular weakness Neurological ROS: as noted in HPI Dermatological ROS: negative for rash and skin lesion changes  Physical Examination: Blood pressure 131/74, pulse 80, temperature 97.7 F (36.5 C), temperature source Oral, resp. rate (!) 22, height 5\' 7"  (1.702 m), weight 65.5 kg (144 lb 8 oz), SpO2 98 %.  HEENT-  Normocephalic, no lesions, without obvious abnormality.  Normal external eye and conjunctiva.  Normal TM's bilaterally.  Normal auditory canals and external ears. Normal external nose, mucus membranes and septum.  Normal pharynx. Cardiovascular- regular rate and rhythm, S1, S2 normal, no murmur, click, rub or gallop, pulses palpable throughout   Lungs- Heart exam - S1, S2 normal, no murmur, no gallop, rate regular Abdomen- soft, non-tender; bowel sounds normal; no masses,  no organomegaly Extremities- no clubbing Lymph-no adenopathy palpable Musculoskeletal-no joint tenderness, deformity or swelling Skin-warm and dry, no hyperpigmentation, vitiligo, or suspicious lesions  Neurological Examination Mental Status: Alert, oriented, thought content appropriate.  Speech fluent without evidence of aphasia.  Able to follow 3  step commands without difficulty. Cranial Nerves: II: Discs flat bilaterally; Visual fields grossly  normal, pupils equal, round, reactive to light and accommodation III,IV, VI: ptosis not present, extra-ocular motions intact bilaterally V,VII: smile symmetric, facial light touch sensation normal bilaterally VIII: hearing normal bilaterally IX,X: gag reflex present XI: bilateral shoulder shrug XII: midline tongue extension Motor: Right : Upper extremity   5/5    Left:     Upper extremity   5/5  Lower extremity   5/5     Lower extremity   5/5 Tone and bulk:normal tone throughout; no atrophy noted Sensory: decreased sensation in his lower extremities compared to uppers Deep Tendon Reflexes: 1+ and symmetric throughout Plantars: Right: downgoing   Left: downgoing Cerebellar: normal finger-to-nose, normal rapid alternating movements and normal heel-to-shin test Gait: normal gait and station      Laboratory Studies:   Basic Metabolic Panel:  Recent Labs Lab 03/07/16 2022  NA 134*  K 4.2  CL 100*  CO2 26  GLUCOSE 283*  BUN 22*  CREATININE 1.19  CALCIUM 9.6    Liver Function Tests:  Recent Labs Lab 03/07/16 2022  AST 22  ALT 14*  ALKPHOS 77  BILITOT 0.4  PROT 7.7  ALBUMIN 4.3   No results for input(s): LIPASE, AMYLASE in the last 168 hours. No results for input(s): AMMONIA in the last 168 hours.  CBC:  Recent Labs Lab 03/07/16 2022  WBC 7.6  NEUTROABS 4.8  HGB 13.9  HCT 40.0  MCV 87.5  PLT 214    Cardiac Enzymes:  Recent Labs Lab 03/07/16 2022 03/08/16 0224 03/08/16 0920  TROPONINI <0.03 <0.03 <0.03    BNP: Invalid input(s): POCBNP  CBG:  Recent Labs Lab 03/07/16 2358 03/08/16 0425 03/08/16 0718 03/08/16 1252  GLUCAP 215* 183* 140* 229*    Microbiology: Results for orders placed or performed during the hospital encounter of 07/03/15  MRSA PCR Screening     Status: None   Collection Time: 07/03/15  7:50 PM  Result Value Ref Range Status   MRSA by PCR NEGATIVE NEGATIVE Final    Comment:        The GeneXpert MRSA Assay  (FDA approved for NASAL specimens only), is one component of a comprehensive MRSA colonization surveillance program. It is not intended to diagnose MRSA infection nor to guide or monitor treatment for MRSA infections.     Coagulation Studies:  Recent Labs  03/07/16 2022  LABPROT 12.2  INR 0.91    Urinalysis:  Recent Labs Lab 03/07/16 2022  COLORURINE YELLOW*  LABSPEC 1.020  PHURINE 6.0  GLUCOSEU >500*  HGBUR NEGATIVE  BILIRUBINUR NEGATIVE  KETONESUR NEGATIVE  PROTEINUR 100*  NITRITE NEGATIVE  LEUKOCYTESUR NEGATIVE    Lipid Panel:     Component Value Date/Time   CHOL 167 03/08/2016 0224   TRIG 92 03/08/2016 0224   HDL 47 03/08/2016 0224   CHOLHDL 3.6 03/08/2016 0224   VLDL 18 03/08/2016 0224   LDLCALC 102 (H) 03/08/2016 0224    HgbA1C:  Lab Results  Component Value Date   HGBA1C 7.3 (H) 07/03/2015    Urine Drug Screen:  No results found for: LABOPIA, COCAINSCRNUR, LABBENZ, AMPHETMU, THCU, LABBARB  Alcohol Level: No results for input(s): ETH in the last 168 hours.  Other results: EKG: normal EKG, normal sinus rhythm, unchanged from previous tracings.  Imaging: Dg Chest 2 View  Result Date: 03/08/2016 CLINICAL DATA:  TIA and right-sided weakness. EXAM: CHEST  2 VIEW COMPARISON:  12/10/2015 FINDINGS: There  is no evidence of pulmonary edema, consolidation, pneumothorax, nodule or pleural fluid. The heart size is normal. There is stable atherosclerosis and ectasia of the thoracic aorta. Stable thoracic spondylosis and scoliosis. IMPRESSION: No active cardiopulmonary disease. Electronically Signed   By: Irish Lack M.D.   On: 03/08/2016 12:48   Dg Chest 2 View  Result Date: 03/07/2016 CLINICAL DATA:  82 y/o M; status post fall in shower. No loss of consciousness. EXAM: CHEST  2 VIEW COMPARISON:  12/10/2015 chest radiograph. FINDINGS: Stable cardiomediastinal silhouette given projection and technique. Clear lungs. No pneumothorax. No pleural effusion.  Aortic arch calcifications. Moderate leftward curvature of the upper thoracic spine and multilevel degenerative changes is stable. Possible meniscal right sixth and seventh posterior rib fractures. IMPRESSION: 1. No acute cardiopulmonary process is identified. 2. Possible nondisplaced right sixth and seventh posterior rib fractures. Consider rib radiographs to assess for additional fractures. Electronically Signed   By: Mitzi Hansen M.D.   On: 03/07/2016 20:18   Dg Ribs Unilateral Right  Result Date: 03/07/2016 CLINICAL DATA:  Acute right rib pain following fall today. Initial encounter. EXAM: RIGHT RIBS - 2 VIEW COMPARISON:  Prior radiographs. FINDINGS: No fracture or other bone lesions are seen involving the ribs. IMPRESSION: Negative. Electronically Signed   By: Harmon Pier M.D.   On: 03/07/2016 21:11   Ct Head Wo Contrast  Result Date: 03/07/2016 CLINICAL DATA:  82 year old male with ataxia for 2 days and recent fall. Currently on Plavix. EXAM: CT HEAD WITHOUT CONTRAST TECHNIQUE: Contiguous axial images were obtained from the base of the skull through the vertex without intravenous contrast. COMPARISON:  10/01/2015 and prior CTs FINDINGS: Brain: No evidence of acute infarction, hemorrhage, hydrocephalus, extra-axial collection or mass lesion/mass effect. Moderate atrophy and chronic small-vessel white matter ischemic changes again noted. Vascular: Intracranial atherosclerosis. Skull: Unremarkable. Sinuses/Orbits: Visualized portions unremarkable. Other: None IMPRESSION: No evidence of acute intracranial abnormality. Atrophy and chronic small-vessel white matter ischemic changes. Electronically Signed   By: Harmon Pier M.D.   On: 03/07/2016 20:22   Mr Brain Wo Contrast  Result Date: 03/08/2016 CLINICAL DATA:  82 year old male with 2 days of ataxia. Recent fall. Initial encounter. EXAM: MRI HEAD WITHOUT CONTRAST MRA HEAD WITHOUT CONTRAST TECHNIQUE: Multiplanar, multiecho pulse sequences of the  brain and surrounding structures were obtained without intravenous contrast. Angiographic images of the head were obtained using MRA technique without contrast. COMPARISON:  Head CT without contrast 03/07/2016. Brain MRI 07/03/2015, and earlier including 10/15/2010 intracranial MRA FINDINGS: MRI HEAD FINDINGS Major intracranial vascular flow voids are stable since 2016. Mild intracranial artery ectasia. No convincing restricted diffusion or evidence of acute infarction. Apparent diffusion abnormality on series 100, image 23 is not correlated on coronal diffusion imaging is in images and appears to be artifact. There is also no associated T2 and FLAIR hyperintensity. Chronic lacunar infarcts in the bilateral deep gray matter nuclei, bilateral corona radiata, and right dorsal pons. Additional patchy bilateral cerebral white matter T2 and FLAIR hyperintensity. No cortical encephalomalacia. Chronic micro hemorrhages in the deep gray matter and brainstem again noted. No midline shift, mass effect, evidence of mass lesion, ventriculomegaly, extra-axial collection or acute intracranial hemorrhage. Cervicomedullary junction and pituitary are within normal limits. Visible internal auditory structures appear normal. Stable paranasal sinuses and mastoid since 2016. Stable orbit and scalp soft tissues. Stable visualized cervical spine. Normal bone marrow signal. MRA HEAD FINDINGS Antegrade flow in the posterior circulation appears stable since 2012. The distal right vertebral artery is mildly dominant. Left  PICA flow is preserved. The right PICA origin remains normal. Patent and incidentally fenestrated vertebrobasilar junction and proximal basilar artery (normal variant). No basilar stenosis. SCA and PCA origins appear stable and normal. Posterior communicating arteries are diminutive or absent. Right PCA branches appear within normal limits. There is severe stenosis of the left PCA P2 segment which is new since 2012.  Preserved distal flow despite this lesion. Antegrade flow in both ICA siphons. Mildly dominant right siphon. Normal ophthalmic artery origins. No siphon stenosis. Patent carotid termini. Normal MCA and right ACA origins. Non dominant left A1 segment is less well visualized today. Anterior communicating artery is present. Progressed and severe left ACA atherosclerosis and tandem stenoses throughout the a 2 segment. Despite this there is preserved distal flow. The right ACA appears stable and within normal limits. MCA M1 segments, MCA M1 segments and MCA bifurcations remain patent. There is increased moderate irregularity at the distal right M1. No major MCA branch occlusion identified. IMPRESSION: 1.  No acute intracranial abnormality. 2. Chronic small vessel disease, stable since December 2016. 3. Intracranial MRA demonstrating progressed and severe atherosclerosis affecting the left PCA and left ACA branches since 2012. Progressed moderate atherosclerosis affecting the distal right MCA M1. Electronically Signed   By: Odessa Fleming M.D.   On: 03/08/2016 12:51   US Carotid Bilateral (at [facility] Only)  Result Date: 03/08/2016 CLINICAL DATA:  TIA, stroke, hypertension, hyperlipidemia and diabetes. EXAM: BILATERAL CAROTID DUPLEX ULTRASOUND TECHNIQUE: Wallace Cullens scale imaging, color Doppler and duplex ultrasound were performed of bilateral carotid and vertebral arteries in the neck. COMPARISON:  07/03/2015 FINDINGS: Criteria: Quantification of carotid stenosis is based on velocity parameters that correlate the residual internal carotid diameter with NASCET-based stenosis levels, using the diameter of the distal internal carotid lumen as the denominator for stenosis measurement. The following velocity measurements were obtained: RIGHT ICA:  81/26 cm/sec CCA:  83/11 cm/sec SYSTOLIC ICA/CCA RATIO:  1.0 DIASTOLIC ICA/CCA RATIO:  2.4 ECA:  51 cm/sec LEFT ICA:  65/16 cm/sec CCA:  72/8 cm/sec SYSTOLIC ICA/CCA RATIO:  0.9 DIASTOLIC  ICA/CCA RATIO:  1.8 ECA:  37 cm/sec RIGHT CAROTID ARTERY: Stable calcified and noncalcified plaque at the level of the carotid bulb and proximal ICA of mild to moderate amount. Velocities and waveforms are normal and estimated proximal right ICA stenosis remains less than 50%. RIGHT VERTEBRAL ARTERY: Antegrade flow with normal waveform and velocity. LEFT CAROTID ARTERY: Stable mild amount of calcified plaque at the level of the distal bulb and proximal ICA. Estimated left ICA stenosis is less than 50%. The left internal carotid artery is moderately tortuous. LEFT VERTEBRAL ARTERY: Antegrade flow with normal waveform and velocity. IMPRESSION: Plaque at the level of both carotid bulbs and proximal internal carotid arteries, slightly more prominently on the right. Overall plaque burden appears similar to the prior ultrasound. No significant carotid stenosis identified with estimated bilateral ICA stenoses of less than 50%. Electronically Signed   By: Irish Lack M.D.   On: 03/08/2016 12:13   Dg Finger Little Left  Result Date: 03/07/2016 CLINICAL DATA:  Slip and fall injury with a cut to the left fifth finger. EXAM: LEFT LITTLE FINGER 2+V COMPARISON:  None. FINDINGS: Soft tissue swelling around the distal left fifth finger. No evidence of acute fracture or dislocation. No radiopaque soft tissue foreign bodies. Degenerative changes in metacarpal phalangeal and interphalangeal joints with slight erosion at the distal aspect of the middle phalanx suggesting erosive osteoarthritis. IMPRESSION: Soft tissue swelling. No acute bony  abnormalities. 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 Electronically Signed   By: Burman Nieves M.D.   On: 03/07/2016 20:13   Mr Maxine Glenn Head/brain NW Cm  Result Date: 03/08/2016 CLINICAL DATA:  82 year old male with 2 days of ataxia. Recent fall. Initial encounter. EXAM: MRI HEAD WITHOUT CONTRAST MRA HEAD WITHOUT CONTRAST TECHNIQUE: Multiplanar, multiecho pulse  sequences of the brain and surrounding structures were obtained without intravenous contrast. Angiographic images of the head were obtained using MRA technique without contrast. COMPARISON:  Head CT without contrast 03/07/2016. Brain MRI 07/03/2015, and earlier including 10/15/2010 intracranial MRA FINDINGS: MRI HEAD FINDINGS Major intracranial vascular flow voids are stable since 2016. Mild intracranial artery ectasia. No convincing restricted diffusion or evidence of acute infarction. Apparent diffusion abnormality on series 100, image 23 is not correlated on coronal diffusion imaging is in images and appears to be artifact. There is also no associated T2 and FLAIR hyperintensity. Chronic lacunar infarcts in the bilateral deep gray matter nuclei, bilateral corona radiata, and right dorsal pons. Additional patchy bilateral cerebral white matter T2 and FLAIR hyperintensity. No cortical encephalomalacia. Chronic micro hemorrhages in the deep gray matter and brainstem again noted. No midline shift, mass effect, evidence of mass lesion, ventriculomegaly, extra-axial collection or acute intracranial hemorrhage. Cervicomedullary junction and pituitary are within normal limits. Visible internal auditory structures appear normal. Stable paranasal sinuses and mastoid since 2016. Stable orbit and scalp soft tissues. Stable visualized cervical spine. Normal bone marrow signal. MRA HEAD FINDINGS Antegrade flow in the posterior circulation appears stable since 2012. The distal right vertebral artery is mildly dominant. Left PICA flow is preserved. The right PICA origin remains normal. Patent and incidentally fenestrated vertebrobasilar junction and proximal basilar artery (normal variant). No basilar stenosis. SCA and PCA origins appear stable and normal. Posterior communicating arteries are diminutive or absent. Right PCA branches appear within normal limits. There is severe stenosis of the left PCA P2 segment which is new  since 2012. Preserved distal flow despite this lesion. Antegrade flow in both ICA siphons. Mildly dominant right siphon. Normal ophthalmic artery origins. No siphon stenosis. Patent carotid termini. Normal MCA and right ACA origins. Non dominant left A1 segment is less well visualized today. Anterior communicating artery is present. Progressed and severe left ACA atherosclerosis and tandem stenoses throughout the a 2 segment. Despite this there is preserved distal flow. The right ACA appears stable and within normal limits. MCA M1 segments, MCA M1 segments and MCA bifurcations remain patent. There is increased moderate irregularity at the distal right M1. No major MCA branch occlusion identified. IMPRESSION: 1.  No acute intracranial abnormality. 2. Chronic small vessel disease, stable since December 2016. 3. Intracranial MRA demonstrating progressed and severe atherosclerosis affecting the left PCA and left ACA branches since 2012. Progressed moderate atherosclerosis affecting the distal right MCA M1. Electronically Signed   By: Odessa Fleming M.D.   On: 03/08/2016 12:51     Assessment/Plan:  82 y.o. male with a known history of CVA on Plavix was in a usual state of health until 2 days ago when he began experiencing acutely worsening gait instability and weakness.   According to the patient's son he has had a gradual decline in his strength and stability over the past several weeks.   While being in hospital symptoms improved and pt is close to baseline.  Strength improved. Apparently these symptoms are predominantly seen when pt has b/l Leg swelling as per son.    No motor deficits.  Pt MRI  brain no acute abnormality but has significant atherosclerotic dz   Plan: - Con't anti platelet therapy - MRI/MRA as above - falls/unsteady gait during times of b/l LE edema could be also due to neuropathy. Possibly diabetic neuropathy. - as out pt would consider obtaining heavy metal screen (cooper/lead, etc) which  could all contribute to neuropathy.  Pt's LFTs within normal limit - Would consider EMG but if true C fiber(small fiber) neuropathy, it will likely not be found/seen - out pt PT.   - follow up with Dr Shah/Dr. Malvin Johns.  03/08/2016, 2:22 PM

## 2016-03-08 NOTE — Evaluation (Signed)
Occupational Therapy Evaluation Patient Details Name: Christopher [name] MRN: [id number] DOB: 1926-05-26 Today's Date: 03/08/2016    History of Present Illness Pt. is an 82 y.o. male whoe was admitted with a TIA. Pt. has a history of CVAs in the past.    Clinical Impression   Pt. Is an 82 y.o. Male who was admitted to [facility] with a TIA. Pt. Presents with decreased motor control with his right hand when trying to pick up items, or manipulate objects which hinders his ability to complete ADL tasks. Pt. Could benefit from skilled OT services for ADL training, neuro-muscular re-ed, UE there. Ex, and pt. Education in work simplification strategies, home modifications, and DME to improve ADL and IADL functioning. Pt. Is retired form working at [employer].    Follow Up Recommendations  Outpatient OT    Equipment Recommendations       Recommendations for Other Services PT consult     Precautions / Restrictions Precautions Precautions: Fall Restrictions Weight Bearing Restrictions: No      Mobility Bed Mobility Overal bed mobility: Independent             General bed mobility comments: Pt able to get up to sitting EOB w/o direct assist  Transfers Overall transfer level: Modified independent Equipment used: None             General transfer comment: Pt initially needing some time to right himself and insure balance but phyiscally is able to rise and maintain balance w/o assist    Balance Overall balance assessment: Modified Independent (Pt would benefit from using SPC)                                          ADL Overall ADL's : Needs assistance/impaired Eating/Feeding: Set up   Grooming: Set up               Lower Body Dressing: Minimal assistance               Functional mobility during ADLs: Supervision/safety       Vision     Perception     Praxis      Pertinent Vitals/Pain Pain Assessment: No/denies pain     Hand Dominance  Right   Extremity/Trunk Assessment Upper Extremity Assessment Upper Extremity Assessment: Generalized weakness (RUE is weaker than left. Right shoulder flexion abduction 4/5, elbow flexion, extension 4+/5, wrist extension 4/5. Left UE 5/5. Grip strength on right: 40#, Left is limiited by 5th digit injury., and dressing in place.)       Communication Communication Communication: HOH;Prefers language other than English (Pt. speaks [place]. Native language is Urdu.)   Cognition Arousal/Alertness: Awake/alert Behavior During Therapy: WFL for tasks assessed/performed Overall Cognitive Status: Within Functional Limits for tasks assessed                     General Comments       Exercises       Shoulder Instructions      Home Living Family/patient expects to be discharged to:: Private residence Living Arrangements: Spouse/significant other Available Help at Discharge: Family Type of Home: House (Duplex) Home Access: Stairs to enter;Ramped entrance Entrance Stairs-Number of Steps: 6 Entrance Stairs-Rails: Left Home Layout: Two level;Bed/bath upstairs Alternate Level Stairs-Number of Steps: 6-7 with L railing to 2nd floor bedroom Alternate Level Stairs-Rails: Right Bathroom Shower/Tub: Tub/shower unit Shower/tub characteristics: Door  Bathroom Toilet: Pharmacist, community: Yes   Home Equipment: Cane - quad          Prior Functioning/Environment Level of Independence: Independent with assistive device(s)        Comments: Apparently he is able to do all he needs in the home, is not out of the house too much anymore.    OT Diagnosis: Generalized weakness   OT Problem List: Decreased strength;Decreased coordination;Decreased activity tolerance;Pain;Impaired UE functional use   OT Treatment/Interventions: Self-care/ADL training;Therapeutic exercise;Neuromuscular education;Therapeutic activities;Patient/family education    OT Goals(Current goals can be  found in the care plan section) Acute Rehab OT Goals Patient Stated Goal: To regain independence. OT Goal Formulation: With patient Potential to Achieve Goals: Good  OT Frequency: Min 1X/week   Barriers to D/C:            Co-evaluation              End of Session    Activity Tolerance: Patient tolerated treatment well Patient left: in chair;with call bell/phone within reach;with chair alarm set;with family/visitor present   Time: 0930-1010 OT Time Calculation (min): 40 min Charges:  OT General Charges $OT Visit: 1 Procedure OT Evaluation $OT Eval Moderate Complexity: 1 Procedure G-Codes: OT G-codes **NOT FOR INPATIENT CLASS** Functional Assessment Tool Used: Clinical judgement based on pt. current functional level. Functional Limitation: Self care Self Care Current Status [phone]): At least 1 percent but less than 20 percent impaired, limited or restricted Self Care Goal Status (O1308): At least 1 percent but less than 20 percent impaired, limited or restricted  Olegario Messier, MS, OTR/L 03/08/2016, 11:08 AM

## 2016-03-08 NOTE — Progress Notes (Signed)
Patient discharged to home.  Son is transporting - and is one of his primary care givers.  Education about his meds and when to take them.

## 2016-03-08 NOTE — Evaluation (Signed)
Physical Therapy Evaluation Patient Details Name: Christopher [name] MRN: [id number] DOB: 1926/04/25 Today's Date: 03/08/2016   History of Present Illness  82 y/o male here with ~2 weeks of unsteadiness, some swelling and general weakness.  He apparently is doing better but not at baseline.  CT negative for CVA, has been having some issues with elevated blood sugars.  Clinical Impression  Pt shows good effort and interest in participating with PT.  He is able to do mobility acts w/o assist and though he has some low level unsteadiness with standing/ambulation he did not need direct assist to keep from falling during infrequent stagger steps.  Pt did not use AD during ambulation today but would benefit from using a cane for regular ambulation - he has a bulky quad cane, interested in a SPC.    Follow Up Recommendations Outpatient PT    Equipment Recommendations  Gilmer Mor (has QC (likely too bulky for his needs) SPC more appropriate)    Recommendations for Other Services       Precautions / Restrictions Precautions Precautions: Fall Restrictions Weight Bearing Restrictions: No      Mobility  Bed Mobility Overal bed mobility: Independent             General bed mobility comments: Pt able to get up to sitting EOB w/o direct assist  Transfers Overall transfer level: Modified independent Equipment used: None             General transfer comment: Pt initially needing some time to right himself and insure balance but phyiscally is able to rise and maintain balance w/o assist  Ambulation/Gait Ambulation/Gait assistance: Min guard;Min assist Ambulation Distance (Feet): 250 Feet Assistive device: None       General Gait Details: Pt has multiple small side shuffle steps (to L > R) but no overt LOBs requiring heavy assist to maintain balance.  He is able to keep himself upright.  Pt's R knee appears to stay somewhat bent/stiff with gait and foot more flat footed with less heel-toe  coordination.  Pt with slow (and per report slower than baseline) gait, but ultimately is safe - instructed pt to use cane consistently at home.  Stairs            Wheelchair Mobility    Modified Rankin (Stroke Patients Only)       Balance Overall balance assessment: Modified Independent (Pt would benefit from using [facility])                                           Pertinent Vitals/Pain Pain Assessment: No/denies pain    Home Living Family/patient expects to be discharged to:: Private residence Living Arrangements: Spouse/significant other;Children Available Help at Discharge: Family   Home Access: Stairs to enter;Ramped entrance Entrance Stairs-Rails: Left Entrance Stairs-Number of Steps: 6   Home Equipment: Cane - quad      Prior Function Level of Independence: Independent with assistive device(s)         Comments: Apparently he is able to do all he needs in the home, is not out of the house too much anymore.     Hand Dominance        Extremity/Trunk Assessment   Upper Extremity Assessment: Generalized weakness           Lower Extremity Assessment: Generalized weakness         Communication   Communication:  HOH;Prefers language other than English (able to communicate effectively in [place], prefers Urdu)  Cognition Arousal/Alertness: Awake/alert Behavior During Therapy: WFL for tasks assessed/performed Overall Cognitive Status: Within Functional Limits for tasks assessed                      General Comments      Exercises        Assessment/Plan    PT Assessment Patient needs continued PT services  PT Diagnosis Difficulty walking;Generalized weakness   PT Problem List Decreased coordination;Decreased balance;Decreased mobility;Decreased activity tolerance;Decreased safety awareness;Decreased knowledge of use of DME  PT Treatment Interventions DME instruction;Gait training;Stair training;Functional mobility  training;Therapeutic activities;Therapeutic exercise;Balance training;Neuromuscular re-education;Patient/family education   PT Goals (Current goals can be found in the Care Plan section) Acute Rehab PT Goals Patient Stated Goal: work on balance and strength at outpatient PT PT Goal Formulation: With patient Time For Goal Achievement: 03/22/16 Potential to Achieve Goals: Good    Frequency Min 2X/week   Barriers to discharge        Co-evaluation               End of Session Equipment Utilized During Treatment: Gait belt Activity Tolerance: Patient tolerated treatment well Patient left: with chair alarm set;with call bell/phone within reach;with family/visitor present Nurse Communication: Mobility status    Functional Assessment Tool Used: clinical judgement Functional Limitation: Mobility: Walking and moving around Mobility: Walking and Moving Around Current Status [phone](G8978): At least 20 percent but less than 40 percent impaired, limited or restricted Mobility: Walking and Moving Around Goal Status [phone](G8979): At least 1 percent but less than 20 percent impaired, limited or restricted    Time: 0838-0905 PT Time Calculation (min) (ACUTE ONLY): 27 min   Charges:   PT Evaluation $PT Eval Low Complexity: 1 Procedure     PT G Codes:   PT G-Codes **NOT FOR INPATIENT CLASS** Functional Assessment Tool Used: clinical judgement Functional Limitation: Mobility: Walking and moving around Mobility: Walking and Moving Around Current Status ([id number](G8978): At least 20 percent but less than 40 percent impaired, limited or restricted Mobility: Walking and Moving Around Goal Status [phone](G8979): At least 1 percent but less than 20 percent impaired, limited or restricted    Malachi ProGalen R Tzippy Testerman, DPT 03/08/2016, 10:55 AM

## 2016-03-08 NOTE — Care Management Obs Status (Signed)
MEDICARE OBSERVATION STATUS NOTIFICATION   Patient Details  Name: Jabier Muttonqbal Buis MRN: [id number] Date of Birth: Dec 27, 1925   Medicare Observation Status Notification Given:  No (discharged less than 24 hours)    Caren MacadamMichelle Shakaya Bhullar, RN 03/08/2016, 3:35 PM

## 2016-03-08 NOTE — Progress Notes (Addendum)
Speech Therapy Note: received order, reviewed chart notes. Consulted NSG then pt and family, Son. Pt denied any difficulty swallowing and is currently on a regular diet; Son agreed stating pt eats "fine" w/ no coughing noted and there was no need for evaluation of this. Pt tolerates swallowing pills w/ water per NSG. Pt conversed at phrase/conversational level in [place] w/out significant deficits noted; min reduced articulation but English is also not his native language. Pt does have a h/o CVA in 12/16 which impaired his speech some then. Son stated pt had Speech Therapy after but was discharged from services. Son still thinks pt "gets mixed up on a word sometimes" - this could be pt's baseline residual issue post that CVA. Son stated it was not worse than his usual at home now; therefore, unsure if this is a new issue. Son would like for pt to "re-start his Speech Therapy outpatient" when he discharges home.  Will monitor pt's presentation while admitted and review test results(MRI) and Neurology notes. Informed Son that pt can f/u w/ Primary MD for the order for any Outpatient tx but that this will be recommended if any new neurological changes have occurred. Son agreed.   No further skilled ST services indicated as pt appears at his baseline. Pt agreed. NSG to reconsult if any change in status.

## 2016-03-08 NOTE — Care Management Note (Addendum)
Case Management Note  Patient Details  Name: Christopher [name] MRN: [id number] Date of Birth: 10-09-1925  Subjective/Objective:    Order for home 3-in-1 DME faxed to [facility] and will be shipped to [name]'s home address. [name] will need to [facility] on Tuesday to request outpatient PT and OT if he wants it because it cannot be scheduled over the weekend or on holidays.                 Action/Plan:   Expected Discharge Date:  03/08/16               Expected Discharge Plan:     In-House Referral:     Discharge planning Services     Post Acute Care Choice:    Choice offered to:     DME Arranged:    DME Agency:     HH Arranged:    HH Agency:     Status of Service:     If discussed at [employer] Meetings, dates discussed:    Additional Comments:  Saharra Santo A, RN 03/08/2016, 3:36 PM

## 2016-03-16 NOTE — Discharge Summary (Signed)
[facility] [place] [facility]   PATIENT NAME: Christopher [name]    MR#:  [id number]  DATE OF BIRTH:  [birth year]  DATE OF ADMISSION:  03/07/2016 ADMITTING PHYSICIAN: Tonye Royalty, DO  DATE OF DISCHARGE: 03/08/2016  4:35 PM  PRIMARY CARE PHYSICIAN: No PCP Per Patient   ADMISSION DIAGNOSIS:  Pain [R52] Gait instability [R26.81] Essential hypertension [I10] Laceration of fifth finger of left hand, initial encounter [S61.217A] Fall, initial encounter [W19.XXXA] Rib contusion, right, initial encounter [S20.211A] Type 2 diabetes mellitus with hyperglycemia, without long-term current use of insulin (HCC) [E11.65]  DISCHARGE DIAGNOSIS:  Active Problems:   TIA (transient ischemic attack)   SECONDARY DIAGNOSIS:   Past Medical History:  Diagnosis Date  . Arthritis   . Ataxia   . Diabetes mellitus without complication (HCC)    controlled  . Dyslipidemia   . Hypertension    somewhat controlled  . Stroke (HCC)   . TIA (transient ischemic attack)      ADMITTING HISTORY  HISTORY OF PRESENT ILLNESS: Please note the majority of the history was obtained from the patient's son who is translating from [place].  The patient speaks and understands English but the patient's son is speaking for his father.  Christopher [name] is a 82 y.o. male with a known history of CVA on Plavix was in a usual state of health until 2 days ago when he began experiencing acutely worsening gait instability and weakness.   According to the patient's son he has had a gradual decline in his strength and stability over the past several weeks. He did see Dr. Malvin Johns last week as an outpatient who ordered MRIs for evaluation given his history of CVAs in the past. Of note the patient's care is being transferred from Dr. Malvin Johns to Dr. Clelia Croft secondary to language barrier.  Patient's symptoms of weakness and gait instability acutely worsened 2 days ago to the point where the patient's son has had to  assist the patient in ambulating around the home which is not his baseline. Patient is reportedly very independent, lives at home with his wife. Today the patient fell getting out of the shower secondary to weakness. He landed on his right side and complained of right-sided chest pain. He denies loss of consciousness, head trauma. Patient denies any difficulty with speech, changes in vision, focal weakness.  Of note patient's son also is concerned about recent elevated blood glucose in the 300s to 400s. He has not been ill and has not been taking any steroids recently.  Otherwise there has been no change in status. Patient has been taking medication as prescribed and there has been no recent change in medication or diet.  There has been no recent illness, travel or sick contacts.    Patient denies fevers/chills, weakness, dizziness, chest pain, shortness of breath, N/V/C/D, abdominal pain, dysuria/frequency, changes in mental status.   HOSPITAL COURSE:   * Gait abnormalities The symptoms were mainly chronic. Patient did have MRI of the brain which showed no acute strokes. Further workup showed no source for any CVA or TIA. Seen by Dr. Loretha Brasil of neurology with whom I discussed the case on day of discharge. Patient will need outpatient follow-up with Dr. Sherryll Burger of neurology for an EMG. Home health physical therapy and nursing set up.  Other comorbidities are stable.  Patient is on aspirin, Plavix and statin.  Stable for discharge home.   CONSULTS OBTAINED:    DRUG ALLERGIES:   Allergies  Allergen Reactions  .  Hydrochlorothiazide     Unknown reaction  . Penicillins Other (See Comments)    Reaction unknown.     DISCHARGE MEDICATIONS:   Discharge Medication List as of 03/08/2016  3:12 PM    CONTINUE these medications which have NOT CHANGED   Details  amLODipine (NORVASC) 10 MG tablet Take 10 mg by mouth daily., Historical Med    aspirin 81 MG chewable tablet Chew 1 tablet (81  mg total) by mouth daily., Starting Thu 07/05/2015, Normal    clopidogrel (PLAVIX) 75 MG tablet Take 1 tablet (75 mg total) by mouth daily., Starting Thu 07/05/2015, Normal    enalapril (VASOTEC) 20 MG tablet Take 1 tablet by mouth 2 (two) times daily., Starting Thu 06/21/2015, Historical Med    glipiZIDE (GLUCOTROL XL) 5 MG 24 hr tablet Take 1 tablet by mouth daily., Starting Thu 06/21/2015, Historical Med    meloxicam (MOBIC) 7.5 MG tablet Take 1 tablet by mouth daily., Starting Thu 06/21/2015, Historical Med    metFORMIN (GLUCOPHAGE) 500 MG tablet Take 2 tablets by mouth 2 (two) times daily., Starting Mon 04/23/2015, Historical Med    metoprolol succinate (TOPROL-XL) 50 MG 24 hr tablet Take 1 tablet by mouth daily., Starting Thu 06/21/2015, Historical Med    Multiple Vitamin (MULTI-VITAMINS) TABS Take 1 tablet by mouth daily., Historical Med    pravastatin (PRAVACHOL) 40 MG tablet Take 1 tablet (40 mg total) by mouth daily., Starting Thu 07/05/2015, Normal    traMADol (ULTRAM) 50 MG tablet Take 1 tablet (50 mg total) by mouth every 6 (six) hours as needed., Starting Mon 10/01/2015, Until Tue 09/30/2016, Print    triamcinolone ointment (KENALOG) 0.1 % Apply 1 application topically 2 (two) times daily., Historical Med    vitamin B-12 (CYANOCOBALAMIN) 1000 MCG tablet Take 1,000 mcg by mouth daily., Historical Med      STOP taking these medications     docusate sodium (COLACE) 100 MG capsule         Today   VITAL SIGNS:  Blood pressure 131/74, pulse 80, temperature 97.7 F (36.5 C), temperature source Oral, resp. rate (!) 22, height 5\' 7"  (1.702 m), weight 65.5 kg (144 lb 8 oz), SpO2 98 %.  I/O:  No intake or output data in the 24 hours ending 03/16/16 1239  PHYSICAL EXAMINATION:  Physical Exam  GENERAL:  82 y.o.-year-old patient lying in the bed with no acute distress.  LUNGS: Normal breath sounds bilaterally, no wheezing, rales,rhonchi or crepitation. No use of accessory  muscles of respiration.  CARDIOVASCULAR: S1, S2 normal. No murmurs, rubs, or gallops.  ABDOMEN: Soft, non-tender, non-distended. Bowel sounds present. No organomegaly or mass.  NEUROLOGIC: Moves all 4 extremities. PSYCHIATRIC: The patient is alert and oriented x 3.  SKIN: No obvious rash, lesion, or ulcer.   DATA REVIEW:   CBC No results for input(s): WBC, HGB, HCT, PLT in the last 168 hours.  Chemistries  No results for input(s): NA, K, CL, CO2, GLUCOSE, BUN, CREATININE, CALCIUM, MG, AST, ALT, ALKPHOS, BILITOT in the last 168 hours.  Invalid input(s): GFRCGP  Cardiac Enzymes No results for input(s): TROPONINI in the last 168 hours.  Microbiology Results  Results for orders placed or performed during the hospital encounter of 07/03/15  MRSA PCR Screening     Status: None   Collection Time: 07/03/15  7:50 PM  Result Value Ref Range Status   MRSA by PCR NEGATIVE NEGATIVE Final    Comment:        The GeneXpert MRSA  Assay (FDA approved for NASAL specimens only), is one component of a comprehensive MRSA colonization surveillance program. It is not intended to diagnose MRSA infection nor to guide or monitor treatment for MRSA infections.     RADIOLOGY:  No results found.  Follow up with PCP in 1 week.  Management plans discussed with the patient, family and they are in agreement.  CODE STATUS:  Code Status History    Date Active Date Inactive Code Status Order ID Comments User Context   03/07/2016 11:45 PM 03/08/2016  6:13 AM Full Code [id number]  Tonye Royalty, DO Inpatient   07/03/2015  4:17 PM 07/05/2015  3:35 PM Full Code [id number]  Katha Hamming, MD ED      TOTAL TIME TAKING CARE OF THIS PATIENT ON DAY OF DISCHARGE: more than 30 minutes.   Milagros Loll R M.D on 03/16/2016 at 12:40 PM  Between 7am to 6pm - Pager - [phone]  After 6pm go to [email or web address] - password [email or web address]  Office  [phone]  CC: Primary care  physician; No PCP Per Patient  Note: This dictation was prepared with Dragon dictation along with smaller phrase technology. Any transcriptional errors that result from this process are unintentional.

## 2016-05-13 ENCOUNTER — Ambulatory Visit: Payer: Medicare Other | Attending: Neurology | Admitting: Physical Therapy

## 2016-05-13 DIAGNOSIS — M6281 Muscle weakness (generalized): Secondary | ICD-10-CM | POA: Insufficient documentation

## 2016-05-13 DIAGNOSIS — R269 Unspecified abnormalities of gait and mobility: Secondary | ICD-10-CM | POA: Insufficient documentation

## 2016-05-13 DIAGNOSIS — Z9181 History of falling: Secondary | ICD-10-CM | POA: Insufficient documentation

## 2016-05-13 DIAGNOSIS — R2681 Unsteadiness on feet: Secondary | ICD-10-CM | POA: Insufficient documentation

## 2016-05-15 ENCOUNTER — Encounter: Payer: Self-pay | Admitting: Physical Therapy

## 2016-05-15 ENCOUNTER — Ambulatory Visit: Payer: Medicare Other | Admitting: Physical Therapy

## 2016-05-15 VITALS — BP 135/79 | HR 96

## 2016-05-15 DIAGNOSIS — Z9181 History of falling: Secondary | ICD-10-CM | POA: Diagnosis present

## 2016-05-15 DIAGNOSIS — R2681 Unsteadiness on feet: Secondary | ICD-10-CM

## 2016-05-15 DIAGNOSIS — M6281 Muscle weakness (generalized): Secondary | ICD-10-CM | POA: Diagnosis present

## 2016-05-15 DIAGNOSIS — R269 Unspecified abnormalities of gait and mobility: Secondary | ICD-10-CM | POA: Diagnosis present

## 2016-05-15 NOTE — Therapy (Signed)
[facility] MAIN [facility] [address] [place], [place], [address] Phone: [phone]   Fax:  [phone]  Physical Therapy Evaluation  Patient Details  Name: Christopher [name] MRN: [id number] Date of Birth: [birth year] Referring Provider: Dr. Sherryll BurgerShah  Encounter Date: 05/15/2016      PT End of Session - 05/15/16 1810    Visit Number 1   Number of Visits 9   Date for PT Re-Evaluation 06/12/16   Authorization Type Gcode 1   Authorization Time Period 10   PT Start Time 1707   PT Stop Time 1746   PT Time Calculation (min) 39 min   Equipment Utilized During Treatment Gait belt   Activity Tolerance Patient tolerated treatment well   Behavior During Therapy [facility] for tasks assessed/performed      Past Medical History:  Diagnosis Date  . Arthritis   . Ataxia   . Diabetes mellitus without complication (HCC)    controlled  . Dyslipidemia   . Hypertension    somewhat controlled  . Stroke (HCC)   . TIA (transient ischemic attack)     History reviewed. No pertinent surgical history.  Vitals:   05/15/16 1723  BP: 135/79  Pulse: 96         Subjective Assessment - 05/15/16 1708    Subjective Patient is a 82 y.o. male who presents to PT s/p recent hospital visit for a fall. The patient is accompanied by his son who helps translate prn. The patient states he has a walking problem and feels like he will fall everytime he walks. Patient denies that he has had recent falls since his last fall occurring in September, which he went to the ER for.  He noted that he had injuries from this falls, but they have healed. The patient states he wants to feel more comfortable when walking because he has to take care of his wife and needs to get up and down a lot during the day.    Patient is accompained by: Family member   Pertinent History Factors Affecting Rehab: poor English, reliant on son for transportation   Limitations Walking;Standing   How long can  you sit comfortably? n/a   How long can you stand comfortably? 5 minutes   How long can you walk comfortably? 5 minutes   Patient Stated Goals wants to be steady when walking   Currently in Pain? No/denies            [facility] PT Assessment - 05/15/16 0001      Assessment   Medical Diagnosis Hemiparesis   Referring Provider Dr. Sherryll BurgerShah   Onset Date/Surgical Date 03/07/16   Next MD Visit Unknown   Prior Therapy Therapy before for the same issue, which was helpful     Precautions   Precautions None     Balance Screen   Has the patient fallen in the past 6 months Yes   How many times? 1   Has the patient had a decrease in activity level because of a fear of falling?  Yes   Is the patient reluctant to leave their home because of a fear of falling?  No     Home Nurse, mental healthnvironment   Living Environment Private residence   Living Arrangements Spouse/significant other;Children   Available Help at Discharge Family   Type of Home House   Home Access Stairs to enter   Entrance Stairs-Number of Steps 6   Entrance Stairs-Rails Left   Home Equipment [place] -  quad;Cane - single point   Additional Comments Patient states he has SPC but does not use it     Prior Function   Level of Independence Independent with household mobility with device   Vocation Retired   GafferVocation Requirements N/A   Leisure Takes care of wife     Cognition   Overall Cognitive Status Within Functional Limits for tasks assessed     Posture/Postural Control   Posture Comments Sits with mild slumped posture and increased kyphosis that is correctable     ROM / Strength   AROM / PROM / Strength AROM;Strength     AROM   Overall AROM Comments WFL B UE/LE     Strength   Overall Strength Comments Gross BUE is 4 to 4+/5    Strength Assessment Site Hip;Knee;Ankle   Right Hip Flexion 4-/5   Right Hip ABduction 4+/5  seated   Right Hip ADduction 4+/5  seated   Left Hip Flexion 4+/5   Left Hip ABduction 4+/5  seated   Left  Hip ADduction 4+/5  seated   Right Knee Flexion 4+/5   Right Knee Extension 4+/5   Left Knee Flexion 4/5   Left Knee Extension 4/5   Right Ankle Dorsiflexion 4+/5   Left Ankle Dorsiflexion 4+/5     Transfers   Comments Able to transfer with/without use of BUEs, preference is with UE support     Ambulation/Gait   Gait Comments Patient is mildly off balance during ambulation; however he is able to self correct and maintain balance throughout; slower gait speed, mild decrease in step length     Standardized Balance Assessment   Five times sit to stand comments  15.9s, >60 >15s indicates increased risk for falls   10 Meter Walk 0.6885m/s, household ambulator, increased risk for falls     [employer] Balance Test   Sit to Stand Able to stand without using hands and stabilize independently   Standing Unsupported Able to stand safely 2 minutes   Sitting with Back Unsupported but Feet Supported on Floor or Stool Able to sit safely and securely 2 minutes   Stand to Sit Sits safely with minimal use of hands   Transfers Able to transfer safely, minor use of hands   Standing Unsupported with Eyes Closed Able to stand 10 seconds safely   Standing Ubsupported with Feet Together Able to place feet together independently and stand 1 minute safely  uses cane to place feet together   From Standing, Reach Forward with Outstretched Arm Can reach forward >12 cm safely (5")   From Standing Position, Pick up Object from Floor Able to pick up shoe safely and easily   From Standing Position, Turn to Look Behind Over each Shoulder Looks behind from both sides and weight shifts well   Turn 360 Degrees Able to turn 360 degrees safely one side only in 4 seconds or less   Standing Unsupported, Alternately Place Feet on Step/Stool Able to complete 4 steps without aid or supervision   Standing Unsupported, One Foot in Front Able to plae foot ahead of the other independently and hold 30 seconds   Standing on One Leg Able to  lift leg independently and hold equal to or more than 3 seconds   Total Score 49   Berg comment: 46-51/56 indicates moderate risk for falls; however general cut off indicates 45/56 is decreased risk for falls     High Level Balance   High Level Balance Comments Safe during static  stand with BLE, SLS RLE=>5 seconds, LLE=<2 seconds      Treatment:  Instructed the patient and son about the plan of care, and the possible transition to supervised fitness area. HEP will be initiated next session.                       PT Education - 05/15/16 1810    Education provided Yes   Education Details POC, transition into healthy fitness    Person(s) Educated Patient;Child(ren)   Methods Explanation   Comprehension Verbalized understanding             PT Long Term Goals - 05/15/16 1820      PT LONG TERM GOAL #1   Title Patient will be independent in home exercise program to improve strength/mobility for better functional independence with ADLs   Time 4   Period Weeks   Status New     PT LONG TERM GOAL #2   Title Patient will increase 10 meter walk test to >0.58m/s as to improve gait speed for better community ambulation and to reduce fall risk   Time 4   Period Weeks   Status New     PT LONG TERM GOAL #3   Title Patient will ascend/descend 6 stairs with 1 rail assist independently without loss of balance to improve ability to get in/out of home   Time 4   Period Weeks   Status New     PT LONG TERM GOAL #4   Title Patient will demonstrate an ability to stand on LLE >5 seconds in order to improve his safety with walking and decrease his risk for falls   Time 4   Period Weeks   Status New     PT LONG TERM GOAL #5   Title Patient will increase gross BLE strength to 4+/5 in order to demonstrate safety with ADLs.     Time 4   Period Weeks   Status New               Plan - 05/15/16 1811    Clinical Impression Statement Patient is an 82 y.o. male  presents to PT s/p chronic hemiparesis and recent fall resulting in hospitalization. The patient is accompanied by his son due to poor understanding of English, and the family is 20 min. late to their appointment. The patient appears to have mild imbalance but is able to right himself during these imbalances. He does not appear confident in his gait with a slower gait speed and careful steps. His Sharlene Motts indicates he is at decreased risk for falls while his 10 meter walk test and 5 time sit to stand indicates he may be at risk for falls. The patient has mild weakness in L UE/LE, but is not a big deficit other than L hip flexion. The patient states he needs to be steady on his feet because he takes care of his wife with dementia. The patient would benefit from continued skilled PT in order to address higher level balance activities and improve his safety in mobility at home.    Rehab Potential Good   Clinical Impairments Affecting Rehab Potential Positive: motivated, family support, good results from previous therapy; Negative: chronic issue, previous falls resulting in injury; Clinical Impression: Stable due to patient's strength and stabilty during eval    PT Frequency 2x / week   PT Duration 4 weeks   PT Treatment/Interventions ADLs/Self Care Home Management;Aquatic Therapy;Biofeedback;Cryotherapy;Electrical Stimulation;Moist Heat;DME Instruction;Gait training;Stair training;Functional  mobility training;Therapeutic activities;Therapeutic exercise;Balance training;Neuromuscular re-education;Patient/family education;Manual techniques;Passive range of motion;Energy conservation   PT Next Visit Plan 6 minute walk test or FGA, BLE strengthening, initiate HEP   PT Home Exercise Plan initiate HEP   Consulted and Agree with Plan of Care Patient;Family member/caregiver      Patient will benefit from skilled therapeutic intervention in order to improve the following deficits and impairments:  Decreased activity  tolerance, Decreased balance, Decreased endurance, Decreased mobility, Decreased strength, Difficulty walking, Hypomobility, Impaired flexibility, Improper body mechanics, Postural dysfunction  Visit Diagnosis: Unsteadiness on feet - Plan: PT plan of care cert/re-cert  Muscle weakness (generalized) - Plan: PT plan of care cert/re-cert      G-Codes - June 14, 2016 1828    Functional Assessment Tool Used Berg Balance, 10 meter, 5 times sit<>stand, clinical judgement   Functional Limitation Mobility: Walking and moving around   Mobility: Walking and Moving Around Current Status [phone]) At least 1 percent but less than 20 percent impaired, limited or restricted   Mobility: Walking and Moving Around Goal Status [phone]) At least 1 percent but less than 20 percent impaired, limited or restricted       Problem List Patient Active Problem List   Diagnosis Date Noted  . TIA (transient ischemic attack) 03/07/2016  . Acute CVA (cerebrovascular accident) (HCC) 07/03/2015   Trula Ore, SPT This entire session was performed under direct supervision and direction of a licensed therapist/therapist assistant . I have personally read, edited and approve of the note as written.  Trotter,Margaret PT, DPT June 14, 2016, 6:30 PM  [facility] MAIN [facility] [address] SERVICES [address] [place], [place], [address] Phone: [phone]   Fax:  [phone]  Name: Javin Nong MRN: [id number] Date of Birth: [birth year]

## 2016-05-20 ENCOUNTER — Ambulatory Visit: Payer: Medicare Other | Admitting: Physical Therapy

## 2016-05-20 ENCOUNTER — Encounter: Payer: Self-pay | Admitting: Physical Therapy

## 2016-05-20 VITALS — BP 154/73 | HR 61

## 2016-05-20 DIAGNOSIS — M6281 Muscle weakness (generalized): Secondary | ICD-10-CM

## 2016-05-20 DIAGNOSIS — R2681 Unsteadiness on feet: Secondary | ICD-10-CM | POA: Diagnosis not present

## 2016-05-20 NOTE — Patient Instructions (Addendum)
Sideward Walking    Walk sideways, both directions. Step out with side of one foot and slide other foot over to meet it. Put red band at thighs.    Copyright  VHI. All rights reserved.  Sit to Stand: Head Tilted Forward / Backward    With head tilted forward, stand up slowly, eyes open. Repeat __10__ times per session. Do _2-3___ sessions per day.  Copyright  VHI. All rights reserved.

## 2016-05-21 NOTE — Therapy (Signed)
[place] [facility] [address] [place], [place], [address] Phone: [phone]   Fax:  [phone]  Physical Therapy Treatment  Patient Details  Name: Christopher [name] MRN: [id number] Date of Birth: July 15, 1925 Referring Provider: Dr. Sherryll BurgerShah  Encounter Date: 05/20/2016      PT End of Session - 05/21/16 0952    Visit Number 2   Number of Visits 9   Date for PT Re-Evaluation 06/12/16   Authorization Type Gcode 2   Authorization Time Period 10   PT Start Time 1731   PT Stop Time 1815   PT Time Calculation (min) 44 min   Equipment Utilized During Treatment Gait belt   Activity Tolerance Patient tolerated treatment well   Behavior During Therapy [facility] for tasks assessed/performed      Past Medical History:  Diagnosis Date  . Arthritis   . Ataxia   . Diabetes mellitus without complication (HCC)    controlled  . Dyslipidemia   . Hypertension    somewhat controlled  . Stroke (HCC)   . TIA (transient ischemic attack)     History reviewed. No pertinent surgical history.  Vitals:   05/21/16 0959  BP: (!) 154/73  Pulse: 61        Subjective Assessment - 05/20/16 1734    Subjective Patient states he feels like he will fall when he's walking and feels like it's getting worse. No falls. Otherwise states he feels fine with no c/o of symptoms.    Limitations Walking;Standing   How long can you sit comfortably? n/a   How long can you stand comfortably? 5 minutes   How long can you walk comfortably? 5 minutes   Patient Stated Goals wants to be steady when walking   Currently in Pain? No/denies      Treatment:  Pt's blood pressure assessed previous to treatment; denies symptoms Forward tapping to dots on airex pad, 2x10, 0 HHA, minA, min VCs for initial set up Tandem forward and backwards walking on firm surface, x4 in // bars, min VCs for foot placement, CGA for safety, 0 HHA   Tandem forward/backwards walking on airex beam,  x6 in // bars, minA for stability, min VCs for continued foot placement Lateral walking with red tband, x6, min VCs to maintain feet facing forward and increase step length, CGA for safety, 0 HHA, instructed to perform in HEP; red tband given to son with instruction to stay safe by having a counter near by Reactive stepping to dots in 4 directions, x15, son assists with VCs due to language issues; minA for stability, min VCs to step with LLE in order to challenge RLE, which patient is less stable on Static tandem stance, 2x30 seconds each with touch downs on bar otherwise 0 HHA, mod VCs and demonstration for patient to understand movement, minA for stability, very difficulty with RLE post.  BLE leg press, 90#  x10 and 120# 2x10, min VCs to decrease knee hyperextension at end of motion and to maintain slow movement to address muscles.  Sit to stands x10, min VCs for eccentric lowering; progressed to 4# weight ball presses overhead x10, min VCs for sequencing of activity; instructed to perform sit to stands at home and focus on eccentric lowering.                             PT Education - 05/21/16 [id number]    Education provided Yes  Education Details HEP progressed   Person(s) Educated Patient   Methods Explanation   Comprehension Verbalized understanding             PT Long Term Goals - 05/15/16 1820      PT LONG TERM GOAL #1   Title Patient will be independent in home exercise program to improve strength/mobility for better functional independence with ADLs   Time 4   Period Weeks   Status New     PT LONG TERM GOAL #2   Title Patient will increase 10 meter walk test to >0.936m/s as to improve gait speed for better community ambulation and to reduce fall risk   Time 4   Period Weeks   Status New     PT LONG TERM GOAL #3   Title Patient will ascend/descend 6 stairs with 1 rail assist independently without loss of balance to improve ability to get in/out of home    Time 4   Period Weeks   Status New     PT LONG TERM GOAL #4   Title Patient will demonstrate an ability to stand on LLE >5 seconds in order to improve his safety with walking and decrease his risk for falls   Time 4   Period Weeks   Status New     PT LONG TERM GOAL #5   Title Patient will increase gross BLE strength to 4+/5 in order to demonstrate safety with ADLs.     Time 4   Period Weeks   Status New               Plan - 05/21/16 [id number]    Clinical Impression Statement Son is present with pt today and acts as an interpreter prn. Pt presents to PT with no symptoms and maintains this status throughout activity. His HEP was initiated to including lateral walking with red tband and mutliple sit to stands. He tolerates progression well and is able to perform balance activities with minA for stability especially during tandem stance and tandem walking. The patient requires min VCs, demonstration, and tactile cues in order to perform exercises well, which is partly due to language barrier. The patient would benefit from skilled PT in order to address BLE weakness, static and dynamic balance, and to increase safety with mobility.   Rehab Potential Good   Clinical Impairments Affecting Rehab Potential Positive: motivated, family support, good results from previous therapy; Negative: chronic issue, previous falls resulting in injury; Clinical Impression: Stable due to patient's strength and stabilty during eval    PT Frequency 2x / week   PT Duration 4 weeks   PT Treatment/Interventions ADLs/Self Care Home Management;Aquatic Therapy;Biofeedback;Cryotherapy;Electrical Stimulation;Moist Heat;DME Instruction;Gait training;Stair training;Functional mobility training;Therapeutic activities;Therapeutic exercise;Balance training;Neuromuscular re-education;Patient/family education;Manual techniques;Passive range of motion;Energy conservation   PT Next Visit Plan dynamic stability, gait training,  progress BLE strengthening   PT Home Exercise Plan HEP initiated-see patient instructions   Consulted and Agree with Plan of Care Patient;Family member/caregiver      Patient will benefit from skilled therapeutic intervention in order to improve the following deficits and impairments:  Decreased activity tolerance, Decreased balance, Decreased endurance, Decreased mobility, Decreased strength, Difficulty walking, Hypomobility, Impaired flexibility, Improper body mechanics, Postural dysfunction  Visit Diagnosis: Unsteadiness on feet  Muscle weakness (generalized)     Problem List Patient Active Problem List   Diagnosis Date Noted  . TIA (transient ischemic attack) 03/07/2016  . Acute CVA (cerebrovascular accident) (HCC) 07/03/2015   Trula Oreae Arelys Glassco, SPT This entire  session was performed under direct supervision and direction of a licensed Estate agent . I have personally read, edited and approve of the note as written.  Trotter,Margaret PT, DPT 05/21/2016, 11:44 AM  [place] MAIN [facility] SERVICES [address] [place], [place], [address] Phone: [phone]   Fax:  [phone]  Name: Rance Smithson MRN: [id number] Date of Birth: 14-Mar-1926

## 2016-05-22 ENCOUNTER — Encounter: Payer: Self-pay | Admitting: Physical Therapy

## 2016-05-22 ENCOUNTER — Ambulatory Visit: Payer: Medicare Other | Admitting: Physical Therapy

## 2016-05-22 VITALS — BP 140/69

## 2016-05-22 DIAGNOSIS — M6281 Muscle weakness (generalized): Secondary | ICD-10-CM

## 2016-05-22 DIAGNOSIS — R269 Unspecified abnormalities of gait and mobility: Secondary | ICD-10-CM

## 2016-05-22 DIAGNOSIS — Z9181 History of falling: Secondary | ICD-10-CM

## 2016-05-22 DIAGNOSIS — R2681 Unsteadiness on feet: Secondary | ICD-10-CM

## 2016-05-22 NOTE — Therapy (Signed)
[place] [facility] [address] [place], [place], [address] Phone: [phone]   Fax:  [phone]  Physical Therapy Treatment  Patient Details  Name: Christopher [name] MRN: [id number] Date of Birth: 1925-11-14 Referring Provider: Dr. Sherryll BurgerShah  Encounter Date: 05/22/2016      PT End of Session - 05/22/16 1733    Visit Number 3   Number of Visits 9   Date for PT Re-Evaluation 06/12/16   Authorization Type Gcode 3   Authorization Time Period 10   PT Start Time 1718  pt not charged for 16 mins needed to get interpreter on the phone   PT Stop Time 1754   PT Time Calculation (min) 36 min   Equipment Utilized During Treatment Gait belt   Activity Tolerance Patient tolerated treatment well   Behavior During Therapy [facility] for tasks assessed/performed      Past Medical History:  Diagnosis Date  . Arthritis   . Ataxia   . Diabetes mellitus without complication (HCC)    controlled  . Dyslipidemia   . Hypertension    somewhat controlled  . Stroke (HCC)   . TIA (transient ischemic attack)     History reviewed. No pertinent surgical history.  Vitals:   05/22/16 1734  BP: 140/69        Subjective Assessment - 05/22/16 1729    Subjective Pt arrived 15 minutes late so session limited.  There is no family member with the pt today.  Pt is agreeable to using a phone interpreter during today's session.  Pt denies any pain.     Limitations Walking;Standing   How long can you sit comfortably? n/a   How long can you stand comfortably? 5 minutes   How long can you walk comfortably? 5 minutes   Patient Stated Goals wants to be steady when walking   Currently in Pain? No/denies         TREATMENT  Thearpeutic Exercise:  Lateral step ups to 8" step with 1HHA x20 each direction  Resisted gait using Matrix machine forward/backward/left/right, 2 weight stacks, x3 in each direction. Cues for taking bigger steps and CGA for safety.   Sit<>stands 2x15 with arms across chest and cues eccentric control              PT Education - 05/22/16 1733    Education provided Yes   Education Details Exercise technique   Person(s) Educated Patient   Methods Explanation;Demonstration   Comprehension Verbalized understanding;Need further instruction;Returned demonstration             PT Long Term Goals - 05/15/16 1820      PT LONG TERM GOAL #1   Title Patient will be independent in home exercise program to improve strength/mobility for better functional independence with ADLs   Time 4   Period Weeks   Status New     PT LONG TERM GOAL #2   Title Patient will increase 10 meter walk test to >0.3622m/s as to improve gait speed for better community ambulation and to reduce fall risk   Time 4   Period Weeks   Status New     PT LONG TERM GOAL #3   Title Patient will ascend/descend 6 stairs with 1 rail assist independently without loss of balance to improve ability to get in/out of home   Time 4   Period Weeks   Status New     PT LONG TERM GOAL #4   Title Patient will demonstrate  an ability to stand on LLE >5 seconds in order to improve his safety with walking and decrease his risk for falls   Time 4   Period Weeks   Status New     PT LONG TERM GOAL #5   Title Patient will increase gross BLE strength to 4+/5 in order to demonstrate safety with ADLs.     Time 4   Period Weeks   Status New               Plan - 05/22/16 2112    Clinical Impression Statement Son not present during today's session to translate but pt agreeable to use phone interpreter.  Interpreter used from [employer]: Vicky ID [id number].  Pt demonstrated fatigue with sit<>stand and resisted gait exercises but tolerated therapy session well with no complaints or symptoms and was very appreciative.  He will benefit from skilled PT interventions to improve strength and balance.   Rehab Potential Good   Clinical Impairments Affecting  Rehab Potential Positive: motivated, family support, good results from previous therapy; Negative: chronic issue, previous falls resulting in injury; Clinical Impression: Stable due to patient's strength and stabilty during eval    PT Frequency 2x / week   PT Duration 4 weeks   PT Treatment/Interventions ADLs/Self Care Home Management;Aquatic Therapy;Biofeedback;Cryotherapy;Electrical Stimulation;Moist Heat;DME Instruction;Gait training;Stair training;Functional mobility training;Therapeutic activities;Therapeutic exercise;Balance training;Neuromuscular re-education;Patient/family education;Manual techniques;Passive range of motion;Energy conservation   PT Next Visit Plan dynamic stability, gait training, progress BLE strengthening   PT Home Exercise Plan HEP initiated-see patient instructions   Consulted and Agree with Plan of Care Patient;Family member/caregiver      Patient will benefit from skilled therapeutic intervention in order to improve the following deficits and impairments:  Decreased activity tolerance, Decreased balance, Decreased endurance, Decreased mobility, Decreased strength, Difficulty walking, Hypomobility, Impaired flexibility, Improper body mechanics, Postural dysfunction  Visit Diagnosis: Unsteadiness on feet  Muscle weakness (generalized)  History of fall  Abnormality of gait     Problem List Patient Active Problem List   Diagnosis Date Noted  . TIA (transient ischemic attack) 03/07/2016  . Acute CVA (cerebrovascular accident) (HCC) 07/03/2015     Encarnacion ChuAshley Waleska Buttery PT, DPT 05/22/2016, 9:15 PM  [place] [place] [facility] [address] [place], [place], [address] Phone: [phone]   Fax:  [phone]  Name: Christopher [name] MRN: [id number] Date of Birth: 05-03-1926

## 2016-05-27 ENCOUNTER — Encounter: Payer: Self-pay | Admitting: Physical Therapy

## 2016-05-27 ENCOUNTER — Ambulatory Visit: Payer: Medicare Other | Admitting: Physical Therapy

## 2016-05-27 DIAGNOSIS — R2681 Unsteadiness on feet: Secondary | ICD-10-CM

## 2016-05-27 DIAGNOSIS — Z9181 History of falling: Secondary | ICD-10-CM

## 2016-05-27 DIAGNOSIS — M6281 Muscle weakness (generalized): Secondary | ICD-10-CM

## 2016-05-27 NOTE — Therapy (Signed)
[facility] [address] [place], [place], [address] Phone: [phone]   Fax:  [phone]  Physical Therapy Treatment  Patient Details  Name: Christopher [name] MRN: [id number] Date of Birth: 28-Sep-1925 Referring Provider: Dr. Sherryll BurgerShah  Encounter Date: 05/27/2016      PT End of Session - 05/27/16 1724    Visit Number 4   Number of Visits 9   Date for PT Re-Evaluation 06/12/16   Authorization Type Gcode 4   Authorization Time Period 10   PT Start Time 1700   PT Stop Time 1745   PT Time Calculation (min) 45 min   Equipment Utilized During Treatment Gait belt   Activity Tolerance Patient tolerated treatment well   Behavior During Therapy [facility] for tasks assessed/performed      Past Medical History:  Diagnosis Date  . Arthritis   . Ataxia   . Diabetes mellitus without complication (HCC)    controlled  . Dyslipidemia   . Hypertension    somewhat controlled  . Stroke (HCC)   . TIA (transient ischemic attack)     History reviewed. No pertinent surgical history.  There were no vitals filed for this visit.      Subjective Assessment - 05/27/16 1710    Subjective Patient arrived with son, son reports, "He does not need translator. 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 He doesn't need interpreter." Patient reports, "I feel okay today. No new changes."    Limitations Walking;Standing   How long can you sit comfortably? n/a   How long can you stand comfortably? 5 minutes   How long can you walk comfortably? 5 minutes   Patient Stated Goals wants to be steady when walking   Currently in Pain? No/denies         TREATMENT: Warm up on Nustep BUE/BLE level 3 x4 min (unbilled);  Resisted gait: 4 way, forward/backward, side/side 12.5# x2 laps each with CGA for safety; Patient required mod VCs to slow down LE movement for better control especially with eccentric return; Requires mod VCs to  increase step length for better gait safety;   Leg press, BLE plate 13#90# K44x15 with mod VCs to slow down LE movement for better strengthening; Leg press, RLE only 60# x10 with min VCs to slow down eccentric movement for better strength and motor control;  Star step outs: x2 each foot in center, with min A for balance and mod VCs for sequencing. Patient had trouble following direction with star step outs. He demonstrates decreased RLE stance control with unsteadiness.  4 way square: clockwise/counterclockwise x3 each, multi-direction x3 min; Patient required mod Vcs to increase step length for better foot clearance. He had increased difficulty with backwards stepping;  Forward/backward walking x20 feet x5 reps with mod Vcs to increase forward lean with backwards walking for better balance control;   Patient required min VCs for balance stability, including to increase trunk control for less loss of balance with smaller base of support                          PT Education - 05/27/16 1724    Education provided Yes   Education Details strengthening, Exercise   Person(s) Educated Patient   Methods Explanation;Verbal cues   Comprehension Verbalized understanding;Returned demonstration;Verbal cues required             PT Long Term Goals - 05/15/16 1820  PT LONG TERM GOAL #1   Title Patient will be independent in home exercise program to improve strength/mobility for better functional independence with ADLs   Time 4   Period Weeks   Status New     PT LONG TERM GOAL #2   Title Patient will increase 10 meter walk test to >0.10667m/s as to improve gait speed for better community ambulation and to reduce fall risk   Time 4   Period Weeks   Status New     PT LONG TERM GOAL #3   Title Patient will ascend/descend 6 stairs with 1 rail assist independently without loss of balance to improve ability to get in/out of home   Time 4   Period Weeks   Status New     PT  LONG TERM GOAL #4   Title Patient will demonstrate an ability to stand on LLE >5 seconds in order to improve his safety with walking and decrease his risk for falls   Time 4   Period Weeks   Status New     PT LONG TERM GOAL #5   Title Patient will increase gross BLE strength to 4+/5 in order to demonstrate safety with ADLs.     Time 4   Period Weeks   Status New               Plan - 05/28/16 0906    Clinical Impression Statement Patient instructed in advanced balance and strengthening exercise. He was able to demonstrate good control of RLE with strengthening exercise. However with fatigue, patient demonstrates increased unsteadiness with right lateral trunk lean. He would benefit from additional skilled PT intervention to improve balance/gait safety and reduce fall risk.    Rehab Potential Good   Clinical Impairments Affecting Rehab Potential Positive: motivated, family support, good results from previous therapy; Negative: chronic issue, previous falls resulting in injury; Clinical Impression: Stable due to patient's strength and stabilty during eval    PT Frequency 2x / week   PT Duration 4 weeks   PT Treatment/Interventions ADLs/Self Care Home Management;Aquatic Therapy;Biofeedback;Cryotherapy;Electrical Stimulation;Moist Heat;DME Instruction;Gait training;Stair training;Functional mobility training;Therapeutic activities;Therapeutic exercise;Balance training;Neuromuscular re-education;Patient/family education;Manual techniques;Passive range of motion;Energy conservation   PT Next Visit Plan dynamic stability, gait training, progress BLE strengthening   PT Home Exercise Plan HEP initiated-see patient instructions   Consulted and Agree with Plan of Care Patient;Family member/caregiver      Patient will benefit from skilled therapeutic intervention in order to improve the following deficits and impairments:  Decreased activity tolerance, Decreased balance, Decreased endurance,  Decreased mobility, Decreased strength, Difficulty walking, Hypomobility, Impaired flexibility, Improper body mechanics, Postural dysfunction  Visit Diagnosis: Unsteadiness on feet  Muscle weakness (generalized)  History of fall     Problem List Patient Active Problem List   Diagnosis Date Noted  . TIA (transient ischemic attack) 03/07/2016  . Acute CVA (cerebrovascular accident) (HCC) 07/03/2015    Jennie Bolar PT, DPT 05/28/2016, 9:08 AM  [facility] [address] [place], [place], [address] Phone: [phone]   Fax:  [phone]  Name: Christopher Muttonqbal Rosenkranz MRN: [id number] Date of Birth: [birth year]

## 2016-06-03 ENCOUNTER — Encounter: Payer: Self-pay | Admitting: Physical Therapy

## 2016-06-03 ENCOUNTER — Ambulatory Visit: Payer: Medicare Other | Admitting: Physical Therapy

## 2016-06-03 DIAGNOSIS — R2681 Unsteadiness on feet: Secondary | ICD-10-CM

## 2016-06-03 DIAGNOSIS — Z9181 History of falling: Secondary | ICD-10-CM

## 2016-06-03 DIAGNOSIS — M6281 Muscle weakness (generalized): Secondary | ICD-10-CM

## 2016-06-03 NOTE — Therapy (Signed)
[place] [facility] [address] [place], [place], [address] Phone: [phone]   Fax:  [phone]  Physical Therapy Treatment  Patient Details  Name: Christopher [name] MRN: [id number] Date of Birth: Sep 11, 1925 Referring Provider: Dr. Sherryll BurgerShah  Encounter Date: 06/03/2016      PT End of Session - 06/03/16 1714    Visit Number 5   Number of Visits 9   Date for PT Re-Evaluation 06/12/16   Authorization Type Gcode 5   Authorization Time Period 10   PT Start Time 0505   PT Stop Time 0545   PT Time Calculation (min) 40 min   Equipment Utilized During Treatment Gait belt   Activity Tolerance Patient tolerated treatment well   Behavior During Therapy [name] for tasks assessed/performed      Past Medical History:  Diagnosis Date  . Arthritis   . Ataxia   . Diabetes mellitus without complication (HCC)    controlled  . Dyslipidemia   . Hypertension    somewhat controlled  . Stroke (HCC)   . TIA (transient ischemic attack)     History reviewed. No pertinent surgical history.  There were no vitals filed for this visit.      Subjective Assessment - 06/03/16 1713    Subjective Patient arrived with son who say that his dad does not need a Nurse, learning disabilitytranslator. He is doing well today wih no reports of new problems.    Patient is accompained by: Family member   Pertinent History Factors Affecting Rehab: poor English, reliant on son for transportation   Limitations Walking;Standing   How long can you sit comfortably? n/a   How long can you stand comfortably? 5 minutes   How long can you walk comfortably? 5 minutes   Patient Stated Goals wants to be steady when walking      Fitness center: ,Leg press:6  plates 5D3,UKGURKYHC3x1,Hamstring curls4  plates 6C37,SEGB3x10,Knee extension 2 plates 1D173x10 Octane cross trainer x 15 minutes level 5 x 15 minutes TM . 7 miles / hour x 5 mins. Patient is unable to walk faster without shuffling his right foot  Patient needs  occasional verbal cueing to improve posture and cueing to correctly perform exercises slowly, holding at end of range to increase motor firing of desired muscle to encourage fatigue.                             PT Education - 06/03/16 1714    Education provided Yes   Education Details Gym fittness   Person([name]) Educated Patient   Methods Explanation   Comprehension Verbalized understanding             PT Long Term Goals - 05/15/16 1820      PT LONG TERM GOAL #1   Title Patient will be independent in home exercise program to improve strength/mobility for better functional independence with ADLs   Time 4   Period Weeks   Status New     PT LONG TERM GOAL #2   Title Patient will increase 10 meter walk test to >0.4149m/[name] as to improve gait speed for better community ambulation and to reduce fall risk   Time 4   Period Weeks   Status New     PT LONG TERM GOAL #3   Title Patient will ascend/descend 6 stairs with 1 rail assist independently without loss of balance to improve ability to get in/out of home  Time 4   Period Weeks   Status New     PT LONG TERM GOAL #4   Title Patient will demonstrate an ability to stand on LLE >5 seconds in order to improve his safety with walking and decrease his risk for falls   Time 4   Period Weeks   Status New     PT LONG TERM GOAL #5   Title Patient will increase gross BLE strength to 4+/5 in order to demonstrate safety with ADLs.     Time 4   Period Weeks   Status New               Plan - 06/03/16 1715    Clinical Impression Statement Patient instructed in gym machines and exercises for strengthening.   Rehab Potential Good   Clinical Impairments Affecting Rehab Potential Positive: motivated, family support, good results from previous therapy; Negative: chronic issue, previous falls resulting in injury; Clinical Impression: Stable due to patient'[name] strength and stabilty during eval    PT Frequency 2x / week    PT Duration 4 weeks   PT Treatment/Interventions ADLs/Self Care Home Management;Aquatic Therapy;Biofeedback;Cryotherapy;Electrical Stimulation;Moist Heat;DME Instruction;Gait training;Stair training;Functional mobility training;Therapeutic activities;Therapeutic exercise;Balance training;Neuromuscular re-education;Patient/family education;Manual techniques;Passive range of motion;Energy conservation   PT Next Visit Plan dynamic stability, gait training, progress BLE strengthening   PT Home Exercise Plan HEP initiated-see patient instructions   Consulted and Agree with Plan of Care Patient;Family member/caregiver      Patient will benefit from skilled therapeutic intervention in order to improve the following deficits and impairments:  Decreased activity tolerance, Decreased balance, Decreased endurance, Decreased mobility, Decreased strength, Difficulty walking, Hypomobility, Impaired flexibility, Improper body mechanics, Postural dysfunction  Visit Diagnosis: Unsteadiness on feet  Muscle weakness (generalized)  History of fall     Problem List Patient Active Problem List   Diagnosis Date Noted  . TIA (transient ischemic attack) 03/07/2016  . Acute CVA (cerebrovascular accident) [facility](HCC) 07/03/2015    Christopher [name], Christopher [name] 06/03/2016, 5:16 PM  [place] [facility] [address] [place], [place], [address] Phone: [phone]   Fax:  [phone]  Name: Christopher [name] MRN: [id number] Date of Birth: 1926-03-19

## 2016-06-05 ENCOUNTER — Ambulatory Visit: Payer: Medicare Other | Admitting: Physical Therapy

## 2016-06-05 DIAGNOSIS — R2681 Unsteadiness on feet: Secondary | ICD-10-CM

## 2016-06-05 DIAGNOSIS — R269 Unspecified abnormalities of gait and mobility: Secondary | ICD-10-CM

## 2016-06-05 DIAGNOSIS — Z9181 History of falling: Secondary | ICD-10-CM

## 2016-06-05 DIAGNOSIS — M6281 Muscle weakness (generalized): Secondary | ICD-10-CM

## 2016-06-05 NOTE — Therapy (Signed)
[facility] [facility] [address] [place], [place], [address] Phone: [phone]   Fax:  [phone]  Physical Therapy Treatment  Patient Details  Name: Christopher [name] MRN: [id number] Date of Birth: 06/14/1926 Referring Provider: Dr. Sherryll BurgerShah  Encounter Date: 06/05/2016      PT End of Session - 06/05/16 1701    Visit Number 6   Number of Visits 9   Date for PT Re-Evaluation 06/12/16   Authorization Type Gcode 6   Authorization Time Period 10   Equipment Utilized During Treatment Gait belt   Activity Tolerance Patient tolerated treatment well   Behavior During Therapy [facility] for tasks assessed/performed      Past Medical History:  Diagnosis Date  . Arthritis   . Ataxia   . Diabetes mellitus without complication (HCC)    controlled  . Dyslipidemia   . Hypertension    somewhat controlled  . Stroke (HCC)   . TIA (transient ischemic attack)     No past surgical history on file.  There were no vitals filed for this visit.      Subjective Assessment - 06/05/16 1700    Subjective Patient is here with his son and is doing fine today.    Patient is accompained by: Family member   Pertinent History Factors Affecting Rehab: poor English, reliant on son for transportation   Limitations Walking;Standing   How long can you stand comfortably? 5 minutes   How long can you walk comfortably? 5 minutes   Patient Stated Goals wants to be steady when walking   Currently in Pain? No/denies         Fitness center: ,Leg press:6 plates 6N6,EXBMWUXLK3x1,Hamstring curls4 plates 4M01,UUVO3x10,Knee extension 2 plates 5D663x10 Octane cross trainer x 15 minutes level 5 x 15 minutes TM . 7 miles / hour x 5 mins. Patient is unable to walk faster without shuffling his right foot  Patient needs occasional verbal cueing to improve posture and cueing to correctly perform exercises slowly, holding at end of range to increase motor firing of desired muscle to encourage fatigue.                           PT Education - 06/05/16 1701    Education provided Yes   Education Details gym fittness   Person(s) Educated Patient   Methods Explanation   Comprehension Verbalized understanding             PT Long Term Goals - 05/15/16 1820      PT LONG TERM GOAL #1   Title Patient will be independent in home exercise program to improve strength/mobility for better functional independence with ADLs   Time 4   Period Weeks   Status New     PT LONG TERM GOAL #2   Title Patient will increase 10 meter walk test to >0.3647m/s as to improve gait speed for better community ambulation and to reduce fall risk   Time 4   Period Weeks   Status New     PT LONG TERM GOAL #3   Title Patient will ascend/descend 6 stairs with 1 rail assist independently without loss of balance to improve ability to get in/out of home   Time 4   Period Weeks   Status New     PT LONG TERM GOAL #4   Title Patient will demonstrate an ability to stand on LLE >5 seconds in order to improve his safety  with walking and decrease his risk for falls   Time 4   Period Weeks   Status New     PT LONG TERM GOAL #5   Title Patient will increase gross BLE strength to 4+/5 in order to demonstrate safety with ADLs.     Time 4   Period Weeks   Status New               Plan - 06/05/16 1701    Clinical Impression Statement Patient was instructed in gym fittness exercises fro strengthening. He has no reports of pain and did have some fatigue wiht rest periods.    Rehab Potential Good   Clinical Impairments Affecting Rehab Potential Positive: motivated, family support, good results from previous therapy; Negative: chronic issue, previous falls resulting in injury; Clinical Impression: Stable due to patient's strength and stabilty during eval    PT Frequency 2x / week   PT Duration 4 weeks   PT Treatment/Interventions ADLs/Self Care Home Management;Aquatic  Therapy;Biofeedback;Cryotherapy;Electrical Stimulation;Moist Heat;DME Instruction;Gait training;Stair training;Functional mobility training;Therapeutic activities;Therapeutic exercise;Balance training;Neuromuscular re-education;Patient/family education;Manual techniques;Passive range of motion;Energy conservation   PT Next Visit Plan dynamic stability, gait training, progress BLE strengthening   PT Home Exercise Plan HEP initiated-see patient instructions   Consulted and Agree with Plan of Care Patient;Family member/caregiver      Patient will benefit from skilled therapeutic intervention in order to improve the following deficits and impairments:  Decreased activity tolerance, Decreased balance, Decreased endurance, Decreased mobility, Decreased strength, Difficulty walking, Hypomobility, Impaired flexibility, Improper body mechanics, Postural dysfunction  Visit Diagnosis: Unsteadiness on feet  Muscle weakness (generalized)  History of fall  Abnormality of gait     Problem List Patient Active Problem List   Diagnosis Date Noted  . TIA (transient ischemic attack) 03/07/2016  . Acute CVA (cerebrovascular accident) [facility](HCC) 07/03/2015   Christopher [name], PT, DPT [place], Barkley BrunsKristine S 06/05/2016, 5:03 PM  [facility] [address] [place], [place], [address] Phone: [phone]   Fax:  [phone]  Name: Christopher Muttonqbal [name] MRN: [id number] Date of Birth: [birth year]

## 2016-06-10 ENCOUNTER — Ambulatory Visit: Payer: Medicare Other | Attending: Neurology

## 2016-06-10 DIAGNOSIS — R471 Dysarthria and anarthria: Secondary | ICD-10-CM | POA: Diagnosis present

## 2016-06-10 DIAGNOSIS — M6281 Muscle weakness (generalized): Secondary | ICD-10-CM | POA: Diagnosis present

## 2016-06-10 DIAGNOSIS — R269 Unspecified abnormalities of gait and mobility: Secondary | ICD-10-CM | POA: Diagnosis present

## 2016-06-10 DIAGNOSIS — Z9181 History of falling: Secondary | ICD-10-CM | POA: Diagnosis present

## 2016-06-10 DIAGNOSIS — R2681 Unsteadiness on feet: Secondary | ICD-10-CM | POA: Insufficient documentation

## 2016-06-10 NOTE — Therapy (Signed)
[facility] [address] [place], [place], [address] Phone: [phone]   Fax:  [phone]  Physical Therapy Treatment  Patient Details  Name: Christopher [name] MRN: [id number] Date of Birth: [birth year] Referring Provider: Dr. Sherryll BurgerShah  Encounter Date: 06/10/2016      PT End of Session - 06/10/16 1747    Visit Number 7   Number of Visits 9   Date for PT Re-Evaluation 06/12/16   Authorization Type Gcode 7   Authorization Time Period 10   PT Start Time 1604   PT Stop Time 1645   PT Time Calculation (min) 41 min   Equipment Utilized During Treatment Gait belt   Activity Tolerance Patient tolerated treatment well   Behavior During Therapy [facility] for tasks assessed/performed      Past Medical History:  Diagnosis Date  . Arthritis   . Ataxia   . Diabetes mellitus without complication (HCC)    controlled  . Dyslipidemia   . Hypertension    somewhat controlled  . Stroke (HCC)   . TIA (transient ischemic attack)     History reviewed. No pertinent surgical history.  There were no vitals filed for this visit.      Subjective Assessment - 06/10/16 1642    Subjective Patient returns and reports increased fatigue after the previous session. Otherwise states he feels fine.    Patient is accompained by: Family member   Pertinent History Factors Affecting Rehab: poor English, reliant on son for transportation   Limitations Walking;Standing   How long can you stand comfortably? 5 minutes   How long can you walk comfortably? 5 minutes   Patient Stated Goals wants to be steady when walking   Currently in Pain? No/denies      TREATMENT: Nustep seat position at 8 - level 3;  5 min; level 4 5 min cueing to improve speed throughout session Leg press at quantum trainer - (B LE) x 20 105#; x20 120#; (unilateral R LE) x 5 120# Hamstring curls 4 plates --   x 20 Knee extension 2 plates - O96x20 TM . 5 miles / hour x 3 mins. Patient is  unable to walk faster without shuffling his right foot - patient requested to stop secondary to increased fatigue  Patient needs occasional verbal cueing to improve posture and cueing to correctly perform exercises slowly. Patient requires cueing on form and technique to perform for proper muscle action          PT Education - 06/10/16 1643    Education provided Yes   Education Details Form and technique with exercise.    Person(s) Educated Patient   Methods Explanation;Demonstration   Comprehension Verbalized understanding;Returned demonstration             PT Long Term Goals - 05/15/16 1820      PT LONG TERM GOAL #1   Title Patient will be independent in home exercise program to improve strength/mobility for better functional independence with ADLs   Time 4   Period Weeks   Status New     PT LONG TERM GOAL #2   Title Patient will increase 10 meter walk test to >0.6760m/s as to improve gait speed for better community ambulation and to reduce fall risk   Time 4   Period Weeks   Status New     PT LONG TERM GOAL #3   Title Patient will ascend/descend 6 stairs with 1 rail assist independently without loss of balance  to improve ability to get in/out of home   Time 4   Period Weeks   Status New     PT LONG TERM GOAL #4   Title Patient will demonstrate an ability to stand on LLE >5 seconds in order to improve his safety with walking and decrease his risk for falls   Time 4   Period Weeks   Status New     PT LONG TERM GOAL #5   Title Patient will increase gross BLE strength to 4+/5 in order to demonstrate safety with ADLs.     Time 4   Period Weeks   Status New               Plan - 06/10/16 1748    Clinical Impression Statement Patient required moderate cueing on performance and set up of exercise equipment. Patient states no increase in pain throughout treatment session but demonstrates decreased muscular strength within B LE (more so on R vs L). Patient will  benefit from further skilled therapy to return to prior level of function.    Rehab Potential Good   Clinical Impairments Affecting Rehab Potential Positive: motivated, family support, good results from previous therapy; Negative: chronic issue, previous falls resulting in injury; Clinical Impression: Stable due to patient's strength and stabilty during eval    PT Frequency 2x / week   PT Duration 4 weeks   PT Treatment/Interventions ADLs/Self Care Home Management;Aquatic Therapy;Biofeedback;Cryotherapy;Electrical Stimulation;Moist Heat;DME Instruction;Gait training;Stair training;Functional mobility training;Therapeutic activities;Therapeutic exercise;Balance training;Neuromuscular re-education;Patient/family education;Manual techniques;Passive range of motion;Energy conservation   PT Next Visit Plan dynamic stability, gait training, progress BLE strengthening   PT Home Exercise Plan HEP initiated-see patient instructions   Consulted and Agree with Plan of Care Patient;Family member/caregiver      Patient will benefit from skilled therapeutic intervention in order to improve the following deficits and impairments:  Decreased activity tolerance, Decreased balance, Decreased endurance, Decreased mobility, Decreased strength, Difficulty walking, Hypomobility, Impaired flexibility, Improper body mechanics, Postural dysfunction  Visit Diagnosis: Unsteadiness on feet  Muscle weakness (generalized)  History of fall     Problem List Patient Active Problem List   Diagnosis Date Noted  . TIA (transient ischemic attack) 03/07/2016  . Acute CVA (cerebrovascular accident) (HCC) 07/03/2015    Myrene GalasWesley Nichlos Kunzler, PT DPT 06/10/2016, 5:51 PM  [facility] [address] [place], [place], [address] Phone: [phone]   Fax:  [phone]  Name: Christopher [name] MRN: [id number] Date of Birth: Feb 17, 1926

## 2016-06-12 ENCOUNTER — Ambulatory Visit: Payer: Medicare Other | Admitting: Physical Therapy

## 2016-06-12 ENCOUNTER — Encounter: Payer: Self-pay | Admitting: Physical Therapy

## 2016-06-12 DIAGNOSIS — R2681 Unsteadiness on feet: Secondary | ICD-10-CM

## 2016-06-12 DIAGNOSIS — M6281 Muscle weakness (generalized): Secondary | ICD-10-CM

## 2016-06-12 NOTE — Therapy (Addendum)
[facility] [address] [place], [place], [address] Phone: [phone]   Fax:  [phone]  Physical Therapy Treatment  Patient Details  Name: Christopher [name] MRN: [id number] Date of Birth: 05/16/1926 Referring Provider: Dr. Sherryll BurgerShah  Encounter Date: 06/12/2016      PT End of Session - 06/12/16 1713    Visit Number 8   Number of Visits 9   Date for PT Re-Evaluation 06/12/16   Authorization Type Gcode 8   Authorization Time Period 10   PT Start Time 1700   PT Stop Time 1745   PT Time Calculation (min) 45 min   Equipment Utilized During Treatment Gait belt   Activity Tolerance Patient tolerated treatment well   Behavior During Therapy [facility] for tasks assessed/performed      Past Medical History:  Diagnosis Date  . Arthritis   . Ataxia   . Diabetes mellitus without complication (HCC)    controlled  . Dyslipidemia   . Hypertension    somewhat controlled  . Stroke (HCC)   . TIA (transient ischemic attack)     History reviewed. No pertinent surgical history.  There were no vitals filed for this visit.      Subjective Assessment - 06/12/16 1712    Subjective Patient returns and reports that he is feeling well today.    Patient is accompained by: Family member   Pertinent History Factors Affecting Rehab: poor English, reliant on son for transportation   Limitations Walking;Standing   How long can you sit comfortably? n/a   How long can you stand comfortably? 5 minutes   How long can you walk comfortably? 5 minutes   Patient Stated Goals wants to be steady when walking   Currently in Pain? No/denies   Multiple Pain Sites No      Fitness center: ,Leg press:6 plates 1L2,GMWNUUVOZ3x1,Hamstring curls4 plates x 36,UYQI20,Knee extension 2 plates x 20 Octane cross trainer x 10 minutes level 10  TM . 7 miles / hour x 5 mins. Patient is unable to walk faster without shuffling his right foot  Patient needs occasional verbal cueing to  improve posture and cueing to correctly perform exercises slowly, holding at end of range to increase motor firing of desired muscle to encourage fatigue                           PT Education - 06/12/16 1712    Education provided Yes   Education Details HEP   Person(s) Educated Patient   Methods Explanation   Comprehension Verbalized understanding             PT Long Term Goals - 05/15/16 1820      PT LONG TERM GOAL #1   Title Patient will be independent in home exercise program to improve strength/mobility for better functional independence with ADLs   Time 4   Period Weeks   Status New     PT LONG TERM GOAL #2   Title Patient will increase 10 meter walk test to >0.8382m/s as to improve gait speed for better community ambulation and to reduce fall risk   Time 4   Period Weeks   Status New     PT LONG TERM GOAL #3   Title Patient will ascend/descend 6 stairs with 1 rail assist independently without loss of balance to improve ability to get in/out of home   Time 4   Period Weeks  Status New     PT LONG TERM GOAL #4   Title Patient will demonstrate an ability to stand on LLE >5 seconds in order to improve his safety with walking and decrease his risk for falls   Time 4   Period Weeks   Status New     PT LONG TERM GOAL #5   Title Patient will increase gross BLE strength to 4+/5 in order to demonstrate safety with ADLs.     Time 4   Period Weeks   Status New               Plan - 06/12/16 1713    Clinical Impression Statement Patient performed LE strengthening and insturction with machines in the fittness center for HEP .    Rehab Potential Good   Clinical Impairments Affecting Rehab Potential Positive: motivated, family support, good results from previous therapy; Negative: chronic issue, previous falls resulting in injury; Clinical Impression: Stable due to patient's strength and stabilty during eval    PT Frequency 2x / week   PT  Duration 4 weeks   PT Treatment/Interventions ADLs/Self Care Home Management;Aquatic Therapy;Biofeedback;Cryotherapy;Electrical Stimulation;Moist Heat;DME Instruction;Gait training;Stair training;Functional mobility training;Therapeutic activities;Therapeutic exercise;Balance training;Neuromuscular re-education;Patient/family education;Manual techniques;Passive range of motion;Energy conservation   PT Next Visit Plan dynamic stability, gait training, progress BLE strengthening   PT Home Exercise Plan HEP initiated-see patient instructions   Consulted and Agree with Plan of Care Patient;Family member/caregiver      Patient will benefit from skilled therapeutic intervention in order to improve the following deficits and impairments:  Decreased activity tolerance, Decreased balance, Decreased endurance, Decreased mobility, Decreased strength, Difficulty walking, Hypomobility, Impaired flexibility, Improper body mechanics, Postural dysfunction  Visit Diagnosis: Unsteadiness on feet  Muscle weakness (generalized)     Problem List Patient Active Problem List   Diagnosis Date Noted  . TIA (transient ischemic attack) 03/07/2016  . Acute CVA (cerebrovascular accident) [facility](HCC) 07/03/2015   [name] Myonna Chisom, PT, DPT [place], Barkley BrunsKristine S 06/12/2016, 5:15 PM  [facility] [address] [place], [place], [address] Phone: [phone]   Fax:  [phone]  Name: Christopher [name] MRN: [id number] Date of Birth: Sep 17, 1925

## 2016-06-17 ENCOUNTER — Ambulatory Visit: Payer: Medicare Other | Admitting: Physical Therapy

## 2016-06-17 ENCOUNTER — Encounter: Payer: Self-pay | Admitting: Physical Therapy

## 2016-06-17 ENCOUNTER — Ambulatory Visit: Payer: Medicare Other | Admitting: Speech Pathology

## 2016-06-17 DIAGNOSIS — R2681 Unsteadiness on feet: Secondary | ICD-10-CM | POA: Diagnosis not present

## 2016-06-17 DIAGNOSIS — Z9181 History of falling: Secondary | ICD-10-CM

## 2016-06-17 DIAGNOSIS — M6281 Muscle weakness (generalized): Secondary | ICD-10-CM

## 2016-06-17 DIAGNOSIS — R269 Unspecified abnormalities of gait and mobility: Secondary | ICD-10-CM

## 2016-06-17 NOTE — Therapy (Signed)
[place] MAIN [facility] SERVICES [address] [place], [place], [address] Phone: [phone]   Fax:  [phone]  Physical Therapy Treatment  Patient Details  Name: Christopher [name] MRN: [id number] Date of Birth: 1926/06/23 Referring Provider: Dr. Manuella Ghazi  Encounter Date: 06/17/2016      PT End of Session - 06/17/16 1645    Visit Number 9   Number of Visits 9   Date for PT Re-Evaluation 06/12/16   Authorization Type Gcode 8   Authorization Time Period 10   PT Start Time 1630   PT Stop Time 1715   PT Time Calculation (min) 45 min   Equipment Utilized During Treatment Gait belt   Activity Tolerance Patient tolerated treatment well   Behavior During Therapy [facility] for tasks assessed/performed      Past Medical History:  Diagnosis Date  . Arthritis   . Ataxia   . Diabetes mellitus without complication ([place])    controlled  . Dyslipidemia   . Hypertension    somewhat controlled  . Stroke ([place])   . TIA (transient ischemic attack)     History reviewed. No pertinent surgical history.  There were no vitals filed for this visit.      Subjective Assessment - 06/17/16 1644    Subjective Patient returns and reports that he is feeling well today.    Patient is accompained by: Family member   Pertinent History Factors Affecting Rehab: poor English, reliant on son for transportation   Limitations Walking;Standing   How long can you stand comfortably? 5 minutes   How long can you walk comfortably? 5 minutes   Patient Stated Goals wants to be steady when walking     Fitness center: Leg press 90 lbs x 20 x 2 Cross trainer x 10 mintues Leg machine knee flex with 3 plates x 20, knee extension 1 plate x 20 Outcome meausres: 10 MW with .42 m/sec Single leg stand with 2 sec on RLE Ascending steps with 1 rail x 4 steps independent Balance on purple foam with head turns feet apart, feet together and UE movement and trunk rotation x 15 each Min  cueing needed to appropriately perform balance tasks with leg, hand, and head position. CGA and min assist for balance activities. No reports of pain.                            PT Education - 06/17/16 1645    Education provided Yes   Education Details HEP   Person(s) Educated Patient   Methods Explanation   Comprehension Verbalized understanding             PT Long Term Goals - 06/17/16 1650      PT LONG TERM GOAL #1   Title Patient will be independent in home exercise program to improve strength/mobility for better functional independence with ADLs   Time 4   Period Weeks   Status On-going     PT LONG TERM GOAL #2   Title Patient will increase 10 meter walk test to >0.41ms as to improve gait speed for better community ambulation and to reduce fall risk   Baseline .49 m/sec   Time 4   Period Weeks   Status Partially Met     PT LONG TERM GOAL #3   Title Patient will ascend/descend 6 stairs with 1 rail assist independently without loss of balance to improve ability to get in/out of home  Baseline independent   Time 4   Period Weeks   Status Achieved     PT LONG TERM GOAL #4   Title Patient will demonstrate an ability to stand on LLE >5 seconds in order to improve his safety with walking and decrease his risk for falls   Baseline 2 seconds   Time 4   Period Weeks   Status Partially Met     PT LONG TERM GOAL #5   Title Patient will increase gross BLE strength to 4+/5 in order to demonstrate safety with ADLs.     Baseline 3+/5 BLE   Time 4   Status Partially Met               Plan - 06/17/16 1646    Clinical Impression Statement Patient is able to perform all strengthening exercises with machines for HEP in fittness center for HEP. Patient continues to have slow gait speed .42 m/sec and has increased fall risk. He will continue to benefit from skilled PT to reach goals and decrease his falls risk.    Rehab Potential Good   Clinical  Impairments Affecting Rehab Potential Positive: motivated, family support, good results from previous therapy; Negative: chronic issue, previous falls resulting in injury; Clinical Impression: Stable due to patient's strength and stabilty during eval    PT Frequency 2x / week   PT Duration 4 weeks   PT Treatment/Interventions ADLs/Self Care Home Management;Aquatic Therapy;Biofeedback;Cryotherapy;Electrical Stimulation;Moist Heat;DME Instruction;Gait training;Stair training;Functional mobility training;Therapeutic activities;Therapeutic exercise;Balance training;Neuromuscular re-education;Patient/family education;Manual techniques;Passive range of motion;Energy conservation   PT Next Visit Plan dynamic stability, gait training, progress BLE strengthening   PT Home Exercise Plan HEP initiated-see patient instructions   Consulted and Agree with Plan of Care Patient;Family member/caregiver      Patient will benefit from skilled therapeutic intervention in order to improve the following deficits and impairments:  Decreased activity tolerance, Decreased balance, Decreased endurance, Decreased mobility, Decreased strength, Difficulty walking, Hypomobility, Impaired flexibility, Improper body mechanics, Postural dysfunction  Visit Diagnosis: Unsteadiness on feet  Muscle weakness (generalized)  History of fall  Abnormality of gait     Problem List Patient Active Problem List   Diagnosis Date Noted  . TIA (transient ischemic attack) 03/07/2016  . Acute CVA (cerebrovascular accident) [facility]) 07/03/2015   Alanson Puls, PT, DPT  [place], [place] S 06/17/2016, [place] MAIN [facility] SERVICES [address] [place], [place], [address] Phone: [phone]   Fax:  [phone]  Name: Aundre Hietala MRN: [id number] Date of Birth: [birth year]

## 2016-06-19 ENCOUNTER — Ambulatory Visit: Payer: Medicare Other | Admitting: Speech Pathology

## 2016-06-19 ENCOUNTER — Encounter: Payer: Self-pay | Admitting: Physical Therapy

## 2016-06-19 ENCOUNTER — Ambulatory Visit: Payer: Medicare Other | Admitting: Physical Therapy

## 2016-06-19 DIAGNOSIS — M6281 Muscle weakness (generalized): Secondary | ICD-10-CM

## 2016-06-19 DIAGNOSIS — R471 Dysarthria and anarthria: Secondary | ICD-10-CM

## 2016-06-19 DIAGNOSIS — R2681 Unsteadiness on feet: Secondary | ICD-10-CM | POA: Diagnosis not present

## 2016-06-19 NOTE — Therapy (Signed)
[facility] MAIN [facility] SERVICES [address] [place], [place], [address] Phone: [phone]   Fax:  [phone]  Physical Therapy Treatment  Patient Details  Name: Christopher [name] MRN: [id number] Date of Birth: 1926/01/08 Referring Provider: Dr. Sherryll Burger  Encounter Date: 06/19/2016      PT End of Session - 06/19/16 1703    Visit Number 10   Number of Visits 17   Date for PT Re-Evaluation 07/15/16   Authorization Type Gcode 10   Authorization Time Period 10   PT Start Time 1700   PT Stop Time 1745   PT Time Calculation (min) 45 min   Equipment Utilized During Treatment Gait belt   Activity Tolerance Patient tolerated treatment well   Behavior During Therapy [facility] for tasks assessed/performed      Past Medical History:  Diagnosis Date  . Arthritis   . Ataxia   . Diabetes mellitus without complication (HCC)    controlled  . Dyslipidemia   . Hypertension    somewhat controlled  . Stroke (HCC)   . TIA (transient ischemic attack)     History reviewed. No pertinent surgical history.  There were no vitals filed for this visit.      Subjective Assessment - 06/19/16 1707    Subjective Patient reports doing okay today; He reports having to help care for his wife as she is not doing well; denies any new falls;    Patient is accompained by: Family member   Pertinent History Factors Affecting Rehab: poor English, reliant on son for transportation   Limitations Walking;Standing   How long can you stand comfortably? 5 minutes   How long can you walk comfortably? 5 minutes   Patient Stated Goals wants to be steady when walking   Currently in Pain? No/denies         TREATMENT: Warm up on treadmill 1.0 mph with 2 HHA x4 min (Unbilled)  Resisted gait: 4 way, forward/backward, side/side 12.5# x2 laps each with CGA for safety; Patient required mod VCs to slow down LE movement for better control especially with eccentric return; Requires mod  VCs to increase step length for better gait safety;   Leg press, BLE plate 764# 7L38 with mod VCs to slow down LE movement for better strengthening; Leg press, RLE only 60# 2x15 with min VCs to slow down eccentric movement for better strength and motor control;  Standing with red band around both legs: Hip abduction 2x12 bilaterally with min VCs for posture and to improve ROM for better strengthening; Hip extension 2x12 bilaterally with min VCs for postural control to improve strengthening;  Standing heel rasies x15 with min Vcs to avoid leaning on UE for better LE weight bearing;  Finished with Nustep BUE/BLE level 2 x4 min with cues to increase speed for better cardiovascular endurance and to increase strengthening;                                PT Education - 06/19/16 1708    Education provided Yes   Education Details strengthening, gait safety;    Person(s) Educated Patient   Methods Explanation;Verbal cues   Comprehension Verbalized understanding;Returned demonstration;Verbal cues required             PT Long Term Goals - 06/17/16 1650      PT LONG TERM GOAL #1   Title Patient will be independent in home exercise program to improve  strength/mobility for better functional independence with ADLs   Time 4   Period Weeks   Status On-going     PT LONG TERM GOAL #2   Title Patient will increase 10 meter walk test to >0.12ms as to improve gait speed for better community ambulation and to reduce fall risk   Baseline .49 m/sec   Time 4   Period Weeks   Status Partially Met     PT LONG TERM GOAL #3   Title Patient will ascend/descend 6 stairs with 1 rail assist independently without loss of balance to improve ability to get in/out of home   Baseline independent   Time 4   Period Weeks   Status Achieved     PT LONG TERM GOAL #4   Title Patient will demonstrate an ability to stand on LLE >5 seconds in order to improve his safety with walking  and decrease his risk for falls   Baseline 2 seconds   Time 4   Period Weeks   Status Partially Met     PT LONG TERM GOAL #5   Title Patient will increase gross BLE strength to 4+/5 in order to demonstrate safety with ADLs.     Baseline 3+/5 BLE   Time 4   Status Partially Met               Plan - 112/26/20171729    Clinical Impression Statement Patient instructed in advanced strengthening exercise. He continues to require cues for correct positioning and to improve ROM for better strengthening. Patient continues to fatigue with prolonged standing and resistive exercise. He would benefit from additional skilled PT Intervention to improve LE strength, balance/gait safety and reduce fall risk;    Rehab Potential Good   Clinical Impairments Affecting Rehab Potential Positive: motivated, family support, good results from previous therapy; Negative: chronic issue, previous falls resulting in injury; Clinical Impression: Stable due to patient's strength and stabilty during eval    PT Frequency 2x / week   PT Duration 4 weeks   PT Treatment/Interventions ADLs/Self Care Home Management;Aquatic Therapy;Biofeedback;Cryotherapy;Electrical Stimulation;Moist Heat;DME Instruction;Gait training;Stair training;Functional mobility training;Therapeutic activities;Therapeutic exercise;Balance training;Neuromuscular re-education;Patient/family education;Manual techniques;Passive range of motion;Energy conservation   PT Next Visit Plan dynamic stability, gait training, progress BLE strengthening   PT Home Exercise Plan HEP initiated-see patient instructions   Consulted and Agree with Plan of Care Patient;Family member/caregiver      Patient will benefit from skilled therapeutic intervention in order to improve the following deficits and impairments:  Decreased activity tolerance, Decreased balance, Decreased endurance, Decreased mobility, Decreased strength, Difficulty walking, Hypomobility, Impaired  flexibility, Improper body mechanics, Postural dysfunction  Visit Diagnosis: Unsteadiness on feet  Muscle weakness (generalized)       G-Codes - 1December 26, 20171709    Functional Assessment Tool Used clinical judgement, 10 meter walk, strength   Functional Limitation Mobility: Walking and moving around   Mobility: Walking and Moving Around Current Status [phone] At least 1 percent but less than 20 percent impaired, limited or restricted   Mobility: Walking and Moving Around Goal Status ([phone] At least 1 percent but less than 20 percent impaired, limited or restricted      Problem List Patient Active Problem List   Diagnosis Date Noted  . TIA (transient ischemic attack) 03/07/2016  . Acute CVA (cerebrovascular accident) ([facility] 07/03/2015    Trotter,Margaret PT, DPT 12017/12/26 5:38 PM  [facility] [address] [address]Phone: [phone]  Fax:  [phone]  Name: Christopher [name] MRN: [id number] Date of Birth: 17-Apr-1926

## 2016-06-20 ENCOUNTER — Encounter: Payer: Self-pay | Admitting: Speech Pathology

## 2016-06-20 NOTE — Therapy (Signed)
[facility] [address] [place], [place], [address] Phone: [phone]   Fax:  [phone]  Speech Language Pathology Evaluation  Patient Details  Name: Christopher [name] MRN: [id number] Date of Birth: [birth year] Referring Provider: Dr. Sherryll BurgerShah  Encounter Date: 06/19/2016      End of Session - 06/20/16 1128    Visit Number 1   Number of Visits 1   Date for SLP Re-Evaluation 06/19/16   SLP Start Time 1615   SLP Stop Time  1655   SLP Time Calculation (min) 40 min   Activity Tolerance Patient tolerated treatment well      Past Medical History:  Diagnosis Date  . Arthritis   . Ataxia   . Diabetes mellitus without complication (HCC)    controlled  . Dyslipidemia   . Hypertension    somewhat controlled  . Stroke (HCC)   . TIA (transient ischemic attack)     History reviewed. No pertinent surgical history.  There were no vitals filed for this visit.          SLP Evaluation [facility] - 06/20/16 0001      SLP Visit Information   SLP Received On 06/19/16   Referring Provider Dr. 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 No/denies     General Information   HPI 82 year old man referred for speech therapy secondary mild dysarthria.    Imaging in September 2017 shows old strokes as well as micro hemorrhages in the bilateral thalamus, pons, and frontal subcortical regions.  Patient is not a native [place] speaker.     Prior Functional Status   Cognitive/Linguistic Baseline Information not available     Cognition   Overall Cognitive Status Difficult to assess  Patient refuses interpreter     Verbal Expression   Overall Verbal Expression Impaired   Other Verbal Expression Comments The son  tries to explain his observation of change- unclear     Oral Motor/Sensory Function   Overall Oral Motor/Sensory Function Appears within functional limits for tasks assessed     Motor Speech   Overall Motor Speech Impaired  Patient is using compensatory strategies             SLP Education - 06/20/16 1127    Education provided Yes   Education Details continue to use speech intelligibility strategies   Person(s) Educated Patient;Child(ren)   Methods Explanation   Comprehension Verbalized understanding              Plan - 06/20/16 1128    Clinical Impression Statement This 82 year old man, with history of CVAs as well as micro hemorrhages in the bilateral thalamus, pons, and frontal subcortical regions, is presenting with mild dysarthria and mild cognitive-communication deficits.  The patient is not a native [place] speaker and he and his son refused an interpreter for this evaluation.  The patient is able to speak English (with a heavy accent) intelligibly using the strategies of speaking slower, more carefully, and louder.  His overall intelligibility (in [place]) was 80%.  His accent was more of a barrier to intelligibility than dysarthria, as long as he utilized the strategies.  The patient's son attempted to explain a language impairment  but was not able to clearly explain his observation (his accent is heavier than his father's).  The patient's son did report that this issue was not consistent and did not present a functional problem.  The patient does not endorse any significant speech or communication problems, except "old age".  The patient does not require speech therapy at this time.  He is successfully implementing appropriate strategies for the dysarthria.   Speech Therapy Frequency One time visit   SLP Home Exercise Plan Continue speak slowly, carefully, and loudly   Consulted and Agree with Plan of Care Patient;Family member/caregiver   Family Member Consulted Son       Patient will benefit from skilled therapeutic intervention in order to improve the following deficits and impairments:   Dysarthria and anarthria - Plan: SLP plan of care cert/re-cert      G-Codes - 06/20/16 1130    Functional Assessment Tool Used Clinical judgment   Functional Limitations Motor speech   Motor Speech Current Status [phone]([id number]) At least 20 percent but less than 40 percent impaired, limited or restricted   Motor Speech Goal Status ([id number](G9186) At least 20 percent but less than 40 percent impaired, limited or restricted   Motor Speech Goal Status (H0865(G9158) At least 20 percent but less than 40 percent impaired, limited or restricted      Problem List Patient Active Problem List   Diagnosis Date Noted  . TIA (transient ischemic attack) 03/07/2016  . Acute CVA (cerebrovascular accident) (HCC) 07/03/2015   Dollene PrimroseSusan G Sobia Karger, MS/CCC- SLP  Leandrew KoyanagiAbernathy, Susie 06/20/2016, 11:32 AM  [facility] [facility] [address] [place], [place], [address] Phone: [phone]   Fax:  [phone]  Name: Christopher [name] MRN: [id number] Date of Birth: [birth year]

## 2016-06-24 ENCOUNTER — Ambulatory Visit: Payer: Medicare Other | Admitting: Physical Therapy

## 2016-06-24 ENCOUNTER — Encounter: Payer: Medicare Other | Admitting: Speech Pathology

## 2016-06-26 ENCOUNTER — Encounter: Payer: Medicare Other | Admitting: Speech Pathology

## 2016-06-26 ENCOUNTER — Ambulatory Visit: Payer: Medicare Other | Admitting: Physical Therapy

## 2016-07-01 ENCOUNTER — Encounter: Payer: Medicare Other | Admitting: Speech Pathology

## 2016-07-01 ENCOUNTER — Ambulatory Visit: Payer: Medicare Other | Admitting: Physical Therapy

## 2016-07-08 ENCOUNTER — Encounter: Payer: Medicare Other | Admitting: Speech Pathology

## 2016-07-08 ENCOUNTER — Ambulatory Visit: Payer: Medicare Other | Admitting: Physical Therapy

## 2016-07-10 ENCOUNTER — Encounter: Payer: Medicare Other | Admitting: Speech Pathology

## 2016-07-15 ENCOUNTER — Encounter: Payer: Medicare Other | Admitting: Speech Pathology

## 2016-07-15 ENCOUNTER — Ambulatory Visit: Payer: Medicare Other | Admitting: Physical Therapy

## 2016-07-17 ENCOUNTER — Ambulatory Visit: Payer: Medicare Other | Admitting: Physical Therapy

## 2016-07-17 ENCOUNTER — Encounter: Payer: Medicare Other | Admitting: Speech Pathology

## 2016-07-22 ENCOUNTER — Ambulatory Visit: Payer: Medicare Other | Attending: Neurology | Admitting: Physical Therapy

## 2016-07-22 ENCOUNTER — Encounter: Payer: Medicare Other | Admitting: Speech Pathology

## 2016-07-24 ENCOUNTER — Encounter: Payer: Medicare Other | Admitting: Speech Pathology

## 2016-07-24 ENCOUNTER — Ambulatory Visit: Payer: Medicare Other | Admitting: Physical Therapy

## 2016-10-15 ENCOUNTER — Emergency Department (HOSPITAL_COMMUNITY): Payer: Medicare Other

## 2016-10-15 ENCOUNTER — Encounter (HOSPITAL_COMMUNITY): Payer: Self-pay

## 2016-10-15 ENCOUNTER — Inpatient Hospital Stay (HOSPITAL_COMMUNITY)
Admission: EM | Admit: 2016-10-15 | Discharge: 2016-10-18 | DRG: 062 | Disposition: A | Discharge status: Home Health | Payer: Medicare Other | Attending: Neurology | Admitting: Neurology

## 2016-10-15 DIAGNOSIS — G8194 Hemiplegia, unspecified affecting left nondominant side: Secondary | ICD-10-CM | POA: Diagnosis present

## 2016-10-15 DIAGNOSIS — I6302 Cerebral infarction due to thrombosis of basilar artery: Principal | ICD-10-CM | POA: Diagnosis present

## 2016-10-15 DIAGNOSIS — R29810 Facial weakness: Secondary | ICD-10-CM | POA: Diagnosis present

## 2016-10-15 DIAGNOSIS — I6789 Other cerebrovascular disease: Secondary | ICD-10-CM | POA: Diagnosis present

## 2016-10-15 DIAGNOSIS — I639 Cerebral infarction, unspecified: Secondary | ICD-10-CM | POA: Diagnosis not present

## 2016-10-15 DIAGNOSIS — E785 Hyperlipidemia, unspecified: Secondary | ICD-10-CM | POA: Diagnosis present

## 2016-10-15 DIAGNOSIS — R471 Dysarthria and anarthria: Secondary | ICD-10-CM | POA: Diagnosis present

## 2016-10-15 DIAGNOSIS — E1165 Type 2 diabetes mellitus with hyperglycemia: Secondary | ICD-10-CM | POA: Diagnosis present

## 2016-10-15 DIAGNOSIS — Z79899 Other long term (current) drug therapy: Secondary | ICD-10-CM

## 2016-10-15 DIAGNOSIS — I10 Essential (primary) hypertension: Secondary | ICD-10-CM | POA: Diagnosis present

## 2016-10-15 DIAGNOSIS — R4701 Aphasia: Secondary | ICD-10-CM | POA: Diagnosis not present

## 2016-10-15 DIAGNOSIS — Z7984 Long term (current) use of oral hypoglycemic drugs: Secondary | ICD-10-CM

## 2016-10-15 DIAGNOSIS — R29706 NIHSS score 6: Secondary | ICD-10-CM | POA: Diagnosis present

## 2016-10-15 DIAGNOSIS — R2990 Unspecified symptoms and signs involving the nervous system: Secondary | ICD-10-CM

## 2016-10-15 DIAGNOSIS — Z7982 Long term (current) use of aspirin: Secondary | ICD-10-CM

## 2016-10-15 DIAGNOSIS — Z8673 Personal history of transient ischemic attack (TIA), and cerebral infarction without residual deficits: Secondary | ICD-10-CM

## 2016-10-15 DIAGNOSIS — Z87891 Personal history of nicotine dependence: Secondary | ICD-10-CM

## 2016-10-15 DIAGNOSIS — Z880 Allergy status to penicillin: Secondary | ICD-10-CM

## 2016-10-15 DIAGNOSIS — Z888 Allergy status to other drugs, medicaments and biological substances status: Secondary | ICD-10-CM

## 2016-10-15 LAB — CBG MONITORING, ED: GLUCOSE-CAPILLARY: 238 mg/dL — AB (ref 65–99)

## 2016-10-15 LAB — I-STAT CHEM 8, ED
BUN: 27 mg/dL — AB (ref 6–20)
CREATININE: 1 mg/dL (ref 0.61–1.24)
Calcium, Ion: 1.06 mmol/L — ABNORMAL LOW (ref 1.15–1.40)
Chloride: 102 mmol/L (ref 101–111)
Glucose, Bld: 261 mg/dL — ABNORMAL HIGH (ref 65–99)
HEMATOCRIT: 44 % (ref 39.0–52.0)
Hemoglobin: 15 g/dL (ref 13.0–17.0)
POTASSIUM: 4.2 mmol/L (ref 3.5–5.1)
SODIUM: 137 mmol/L (ref 135–145)
TCO2: 25 mmol/L (ref 0–100)

## 2016-10-15 LAB — DIFFERENTIAL
BASOS ABS: 0 10*3/uL (ref 0.0–0.1)
Basophils Relative: 0 %
EOS PCT: 1 %
Eosinophils Absolute: 0.1 10*3/uL (ref 0.0–0.7)
LYMPHS ABS: 1.7 10*3/uL (ref 0.7–4.0)
LYMPHS PCT: 20 %
Monocytes Absolute: 0.5 10*3/uL (ref 0.1–1.0)
Monocytes Relative: 5 %
NEUTROS ABS: 6.4 10*3/uL (ref 1.7–7.7)
NEUTROS PCT: 74 %

## 2016-10-15 LAB — COMPREHENSIVE METABOLIC PANEL
ALBUMIN: 4.2 g/dL (ref 3.5–5.0)
ALK PHOS: 71 U/L (ref 38–126)
ALT: 21 U/L (ref 17–63)
AST: 27 U/L (ref 15–41)
Anion gap: 15 (ref 5–15)
BILIRUBIN TOTAL: 0.7 mg/dL (ref 0.3–1.2)
BUN: 22 mg/dL — AB (ref 6–20)
CALCIUM: 9.3 mg/dL (ref 8.9–10.3)
CO2: 21 mmol/L — AB (ref 22–32)
CREATININE: 1.12 mg/dL (ref 0.61–1.24)
Chloride: 100 mmol/L — ABNORMAL LOW (ref 101–111)
GFR calc Af Amer: >60 mL/min (ref >60)
GFR calc non Af Amer: 57 mL/min — ABNORMAL LOW (ref >60)
GLUCOSE: 259 mg/dL — AB (ref 65–99)
Potassium: 4 mmol/L (ref 3.5–5.1)
SODIUM: 136 mmol/L (ref 135–145)
TOTAL PROTEIN: 7.7 g/dL (ref 6.5–8.1)

## 2016-10-15 LAB — CBC
HCT: 41.8 % (ref 39.0–52.0)
HEMOGLOBIN: 14.1 g/dL (ref 13.0–17.0)
MCH: 29.2 pg (ref 26.0–34.0)
MCHC: 33.7 g/dL (ref 30.0–36.0)
MCV: 86.5 fL (ref 78.0–100.0)
PLATELETS: 200 10*3/uL (ref 150–400)
RBC: 4.83 MIL/uL (ref 4.22–5.81)
RDW: 13.4 % (ref 11.5–15.5)
WBC: 8.6 10*3/uL (ref 4.0–10.5)

## 2016-10-15 LAB — GLUCOSE, CAPILLARY
GLUCOSE-CAPILLARY: 151 mg/dL — AB (ref 65–99)
Glucose-Capillary: 119 mg/dL — ABNORMAL HIGH (ref 65–99)

## 2016-10-15 LAB — I-STAT TROPONIN, ED: Troponin i, poc: 0 ng/mL (ref 0.00–0.08)

## 2016-10-15 LAB — APTT: aPTT: 31 seconds (ref 24–36)

## 2016-10-15 LAB — PROTIME-INR
INR: 1.06
Prothrombin Time: 13.9 seconds (ref 11.4–15.2)

## 2016-10-15 LAB — MRSA PCR SCREENING: MRSA BY PCR: NEGATIVE

## 2016-10-15 MED ORDER — ALTEPLASE (STROKE) FULL DOSE INFUSION
62.0000 mg | Freq: Once | INTRAVENOUS | Status: AC
  Administered 2016-10-15: 62 mg via INTRAVENOUS
  Filled 2016-10-15: qty 100

## 2016-10-15 MED ORDER — CLEVIDIPINE BUTYRATE 0.5 MG/ML IV EMUL: 0.0000 mg/h | INTRAVENOUS | Status: DC

## 2016-10-15 MED ORDER — SODIUM CHLORIDE 0.9 % IV SOLN
50.0000 mL | Freq: Once | INTRAVENOUS | Status: AC
  Administered 2016-10-15: 50 mL via INTRAVENOUS

## 2016-10-15 MED ORDER — LABETALOL HCL 5 MG/ML IV SOLN: 20.0000 mg | Freq: Once | INTRAVENOUS | Status: DC

## 2016-10-15 MED ORDER — IOPAMIDOL (ISOVUE-370) INJECTION 76%
INTRAVENOUS | Status: AC
  Administered 2016-10-15: 50 mL
  Filled 2016-10-15: qty 50

## 2016-10-15 MED ORDER — PANTOPRAZOLE SODIUM 40 MG IV SOLR
40.0000 mg | Freq: Every day | INTRAVENOUS | Status: DC
  Administered 2016-10-15: 40 mg via INTRAVENOUS
  Filled 2016-10-15: qty 40

## 2016-10-15 MED ORDER — STROKE: EARLY STAGES OF RECOVERY BOOK
Freq: Once | Status: DC
  Filled 2016-10-15: qty 1

## 2016-10-15 MED ORDER — SODIUM CHLORIDE 0.9 % IV SOLN
INTRAVENOUS | Status: DC
  Administered 2016-10-15 (×2): via INTRAVENOUS

## 2016-10-15 MED ORDER — ACETAMINOPHEN 650 MG RE SUPP: 650.0000 mg | RECTAL | Status: DC | PRN

## 2016-10-15 MED ORDER — ACETAMINOPHEN 325 MG PO TABS: 650.0000 mg | ORAL_TABLET | ORAL | Status: DC | PRN

## 2016-10-15 MED ORDER — ACETAMINOPHEN 160 MG/5ML PO SOLN: 650.0000 mg | ORAL | Status: DC | PRN

## 2016-10-15 NOTE — ED Triage Notes (Signed)
Pt from home with trouble getting his words out. Pt last seen well at 0700.

## 2016-10-15 NOTE — Code Documentation (Signed)
83 y.o. Male w/ hx of TIA, CVA, HTN, HLD, DM and ataxia who had acute onset of global aphasia and LLE hemiparesis. Pt LKW at 0700 per family. Pt brought to [facility] via [facility]. Code Stroke called at bridge by EDP. CT negative for acute abnormalities. ASPECTS 10. CTA negative for LVO. NIHSS 6. See EMR for NIHSS and code stroke times. On assessment pt noted to have abrasion to his left lateral malleolus. Family and EMS not reporting fall or abrasion. Pt with partial gaze palsy, unable to look left, partial hemianopia, slight facial palsy, partial sensory loss and dysarthria that has waxed and waned. Dysarthria from mild to severe. Full dose IV tPA given. See documentation in the [facility]. Patient monitored frequently. Handoff with ED RN Windell Moulding.

## 2016-10-15 NOTE — ED Notes (Signed)
[id number]  

## 2016-10-15 NOTE — Progress Notes (Signed)
Inpatient Diabetes Program Recommendations  AACE/ADA: New Consensus Statement on Inpatient Glycemic Control (2015)  Target Ranges:  Prepandial:   less than 140 mg/dL      Peak postprandial:   less than 180 mg/dL (1-2 hours)      Critically ill patients:  140 - 180 mg/dL   Review of Glycemic Control  Diabetes history: DM 2 Outpatient Diabetes medications: Glipizide 5 mg Daily, Metformin 1000 mg BID Current orders for Inpatient glycemic control: None  Inpatient Diabetes Program Recommendations:   Glucose in the 200's. Consider starting CBGs ACHS and Novolog Sensitive Correction tid + Hs scale.  Thanks,  Christena Deem RN, MSN, [facility] Inpatient Diabetes Coordinator Team Pager [phone] (8a-5p)

## 2016-10-15 NOTE — ED Provider Notes (Signed)
[facility]-EMERGENCY DEPT Provider Note   CSN: [id number] Arrival date & time: 10/15/16  3086     History   Chief Complaint No chief complaint on file.   HPI Christopher [name] is a 83 y.o. male.  HPI Patient has evaluated for code stroke upon EMS arrival. Patient cannot give any history thus all history is from EMS at this time. Reportedly family members were with the patient when he had an abrupt change at 7 AM. At baseline the patient is reported to be alert and active with normal speech and gait. At 7 AM he stopped communicating. EMS reports that the patient could not answer any questions but could follow some commands. They report that he drooled copiously at one point. He has been awake and gazing about. No respiratory distress. Family has not yet arrived anticipate additional history. Past Medical History:  Diagnosis Date  . Arthritis   . Ataxia   . Diabetes mellitus without complication (HCC)    controlled  . Dyslipidemia   . Hypertension    somewhat controlled  . Stroke (HCC)   . TIA (transient ischemic attack)     Patient Active Problem List   Diagnosis Date Noted  . TIA (transient ischemic attack) 03/07/2016  . Acute CVA (cerebrovascular accident) (HCC) 07/03/2015    No past surgical history on file.     Home Medications    Prior to Admission medications   Medication Sig Start Date End Date Taking? Authorizing Provider  amLODipine (NORVASC) 10 MG tablet Take 10 mg by mouth daily.    Historical Provider, MD  aspirin 81 MG chewable tablet Chew 1 tablet (81 mg total) by mouth daily. 07/05/15   Katha Hamming, MD  clopidogrel (PLAVIX) 75 MG tablet Take 1 tablet (75 mg total) by mouth daily. 07/05/15   Katha Hamming, MD  enalapril (VASOTEC) 20 MG tablet Take 1 tablet by mouth 2 (two) times daily. 06/21/15   Historical Provider, MD  glipiZIDE (GLUCOTROL XL) 5 MG 24 hr tablet Take 1 tablet by mouth daily. 06/21/15   Historical Provider, MD  meloxicam (MOBIC) 7.5  MG tablet Take 1 tablet by mouth daily. 06/21/15   Historical Provider, MD  metFORMIN (GLUCOPHAGE) 500 MG tablet Take 2 tablets by mouth 2 (two) times daily. 04/23/15   Historical Provider, MD  metoprolol succinate (TOPROL-XL) 50 MG 24 hr tablet Take 1 tablet by mouth daily. 06/21/15   Historical Provider, MD  Multiple Vitamin (MULTI-VITAMINS) TABS Take 1 tablet by mouth daily.    Historical Provider, MD  pravastatin (PRAVACHOL) 40 MG tablet Take 1 tablet (40 mg total) by mouth daily. 07/05/15   Katha Hamming, MD  triamcinolone ointment (KENALOG) 0.1 % Apply 1 application topically 2 (two) times daily.    Historical Provider, MD  vitamin B-12 (CYANOCOBALAMIN) 1000 MCG tablet Take 1,000 mcg by mouth daily.    Historical Provider, MD    Family History No family history on file.  Social History Social History  Substance Use Topics  . Smoking status: Former Games developer  . Smokeless tobacco: Former Neurosurgeon  . Alcohol use No     Allergies   Hydrochlorothiazide and Penicillins   Review of Systems Review of Systems Cannot obtain due to patient condition  Physical Exam Updated Vital Signs There were no vitals taken for this visit.  Physical Exam  Constitutional:  Patient is sitting upright in EMS stretcher. He is gazing about awake. His affect is very flat. Patient appears slightly pale.  HENT:  Head: Normocephalic and  atraumatic.  Patient opens mouth upon request and posterior airway is patent with no pooling secretions.  Cardiovascular: Intact distal pulses.   Pulmonary/Chest: Effort normal.  Neurological:  Patient is sitting upright and awake. He cannot speak. He seems to have a rightward gaze preference. He follows commands to grip my fingers in both upper extremities. Grips and then is slightly delayed in releasing when asked to do so.  Skin: Skin is warm and dry. There is pallor.  Psychiatric:  Affect is very flat   10:10 patient's physical examination has improved relative  to first arrival by EMS. Patient now can verbalize with very slurred speech. Responses indicate situational orientation. Patient persists in having a left gaze avoidance. No pooling secretions in the airway. Tongue depressor used, patient does have depressed gag but intact. Respirations are not sonorous. Patient is currently protecting airway. Patient will perform grip strength bilateral hands. When hands are placed in a pronated position and arms extended, patient is able to hold this position. Legs are elevated off the bed he can hold each off the bed independently. Patient will move both feet to command.  Heart is regular no gross rub murmur gallop. Bilateral breath sounds. No gross wheeze rhonchi rail. Very superficial abrasion to left lateral ankle. No deformities of the extremities.  ED Treatments / Results  Labs (all labs ordered are listed, but only abnormal results are displayed) Labs Reviewed  CBG MONITORING, ED - Abnormal; Notable for the following:       Result Value   Glucose-Capillary 238 (*)    All other components within normal limits  PROTIME-INR  APTT  CBC  DIFFERENTIAL  COMPREHENSIVE METABOLIC PANEL  I-STAT TROPOININ, ED  I-STAT CHEM 8, ED    EKG  EKG Interpretation None       Radiology No results found.  Procedures Procedures (including critical care time) CRITICAL CARE Performed by: Arby Barrette   Total critical care time: 30  minutes  Critical care time was exclusive of separately billable procedures and treating other patients.  Critical care was necessary to treat or prevent imminent or life-threatening deterioration.  Critical care was time spent personally by me on the following activities: development of treatment plan with patient and/or surrogate as well as nursing, discussions with consultants, evaluation of patient's response to treatment, examination of patient, obtaining history from patient or surrogate, ordering and performing  treatments and interventions, ordering and review of laboratory studies, ordering and review of radiographic studies, pulse oximetry and re-evaluation of patient's condition. Medications Ordered in ED Medications - No data to display   Initial Impression / Assessment and Plan / ED Course  I have reviewed the triage vital signs and the nursing notes.  Pertinent labs & imaging results that were available during my care of the patient were reviewed by me and considered in my medical decision making (see chart for details).     Final Clinical Impressions(s) / ED Diagnoses   Final diagnoses:  Stroke-like symptoms  Patient has severe aphasia. He was evaluated as a code stroke. TPA initiated by stroke team. Upon reassessment at 10:10, patient was making improvement now speaking although slurred. Patient is protecting his airway. Patient will be admitted to neuro hospitalist for acute CVA being treated with TPA.  New Prescriptions New Prescriptions   No medications on file     Arby Barrette, MD 10/16/16 1825

## 2016-10-15 NOTE — H&P (Signed)
Chief Complaint: slurred speech   History obtained from:  Patient  And family   HPI:                                                                                                                                         Christopher [name] is an 83 y.o. male with PMH significant for HTN, DM, CVA who presented to the hospital via [facility] as stroke code Pt and family resported that pt started having difficulty speaking with sluured speech and difficulty getting his words out started an hour prior to calling 911 and was getting worse, pt was last seen normal by family at 7 am this morning, pt also reported L weakness, denied visual changes, chest pain or SOB In ED, initial CTH showed no acute changes, I evaluated pt for IVTPA and determined that he was a candidate with no contraindication, IVTPA was discussed with pt and his son and consent was obtained from both, CTA showed no LVO so no intervention was needed, IVTPA started at 9.49 AM, pt was admitted afterwards to ICU for stroke care.  Date last known well: 10/15/2016 Time last known well: 7 am  tPA Given: yes started at 949 am  No significant disability/able to carry out all usual activities   1   Modified Rankin:1    Past Medical History:  Diagnosis Date  . Arthritis   . Ataxia   . Diabetes mellitus without complication (HCC)    controlled  . Dyslipidemia   . Hypertension    somewhat controlled  . Stroke (HCC)   . TIA (transient ischemic attack)     History reviewed. No pertinent surgical history.  No family history on file. Social History:  reports that he has quit smoking. He has quit using smokeless tobacco. He reports that he does not drink alcohol or use drugs.  Allergies:  Allergies  Allergen Reactions  . Hydrochlorothiazide     Unknown reaction  . Penicillins Other (See Comments)    Reaction unknown.     Medications:                                                                                                                            I have reviewed the patient's current medications.  ROS:      15 points review of system obtain  and was negative except for HPI                                                                                                                                  Neurologic Examination:                                                                                                      Blood pressure 132/69, pulse 86, temperature 98.6 F (37 C), temperature source Oral, resp. rate 16, weight 68.6 kg (151 lb 3.8 oz), SpO2 96 %.  HEENT-  Normocephalic, no lesions, without obvious abnormality.   Cardiovascular- regular rate and rhythm, S1, S2 normal, no murmur, click, rub or gallop, pulses palpable throughout   Lungs- CTA Abdomen- soft nt  Extremities- normal  LSkin-{Exam; skin warm and dry  Neurological Examination  NIHSS 6 Awake, alert oriented to self and place and year  Answers orientation questions correctly Mild left facial droop Severe dysarthria  L visual field cut  No aphasia normal language No ataxia normal F t N Decrease sensory on L   All lab results and Imaging studies reviewed by me Lab Results: Basic Metabolic Panel:  Recent Labs Lab 10/15/16 0920 10/15/16 0928  NA 136 137  K 4.0 4.2  CL 100* 102  CO2 21*  --   GLUCOSE 259* 261*  BUN 22* 27*  CREATININE 1.12 1.00  CALCIUM 9.3  --     Liver Function Tests:  Recent Labs Lab 10/15/16 0920  AST 27  ALT 21  ALKPHOS 71  BILITOT 0.7  PROT 7.7  ALBUMIN 4.2   No results for input(s): LIPASE, AMYLASE in the last 168 hours. No results for input(s): AMMONIA in the last 168 hours.  CBC:  Recent Labs Lab 10/15/16 0920 10/15/16 0928  WBC 8.6  --   NEUTROABS 6.4  --   HGB 14.1 15.0  HCT 41.8 44.0  MCV 86.5  --   PLT 200  --     Cardiac Enzymes: No results for input(s): CKTOTAL, CKMB, CKMBINDEX, TROPONINI in the last 168 hours.  Lipid Panel: No results for input(s):  CHOL, TRIG, HDL, CHOLHDL, VLDL, LDLCALC in the last 168 hours.  CBG:  Recent Labs Lab 10/15/16 0920  GLUCAP 238*    Microbiology: Results for orders placed or performed during the hospital encounter of 07/03/15  MRSA PCR Screening     Status: None   Collection Time: 07/03/15  7:50 PM  Result Value Ref Range Status   MRSA  by PCR NEGATIVE NEGATIVE Final    Comment:        The GeneXpert MRSA Assay (FDA approved for NASAL specimens only), is one component of a comprehensive MRSA colonization surveillance program. It is not intended to diagnose MRSA infection nor to guide or monitor treatment for MRSA infections.     Coagulation Studies:  Recent Labs  10/15/16 0920  LABPROT 13.9  INR 1.06    Imaging: Ct Angio Head W Or Wo Contrast  Result Date: 10/15/2016 CLINICAL DATA:  Aphasia.  Code stroke EXAM: CT ANGIOGRAPHY HEAD AND NECK TECHNIQUE: Multidetector CT imaging of the head and neck was performed using the standard protocol during bolus administration of intravenous contrast. Multiplanar CT image reconstructions and MIPs were obtained to evaluate the vascular anatomy. Carotid stenosis measurements (when applicable) are obtained utilizing NASCET criteria, using the distal internal carotid diameter as the denominator. CONTRAST:  50 mL Isovue 370 IV COMPARISON:  CT head 10/15/2016 FINDINGS: CTA NECK FINDINGS Aortic arch: Mild atherosclerotic disease in the aortic arch. Negative for aneurysm. Proximal great vessels patent. Right carotid system: Mild atherosclerotic calcification of the right carotid bifurcation without significant stenosis. Left carotid system: Mild atherosclerotic calcification at the carotid bifurcation without significant stenosis. Vertebral arteries: Both vertebral arteries are patent to the basilar without significant stenosis. Skeleton: Marked cervical kyphosis. Advanced multilevel spondylosis and spurring present multilevel spinal stenosis due to spurring.  Other neck: Negative for mass or adenopathy Upper chest: Lung apices clear. Calcified granuloma right upper lobe. Review of the MIP images confirms the above findings CTA HEAD FINDINGS Anterior circulation: Atherosclerotic calcification in the cavernous carotid bilaterally without significant carotid stenosis. Anterior and middle cerebral arteries patent bilaterally. Atherosclerotic irregularity in the left A2 segment and in the right M1 segment. No large vessel occlusion. Posterior circulation: Both vertebral arteries patent to the basilar. PICA patent. Basilar widely patent. Superior cerebellar and posterior cerebral arteries patent bilaterally. Moderate stenosis of the posterior cerebral artery bilaterally. Venous sinuses: Patent Anatomic variants: None Delayed phase: Not perform Review of the MIP images confirms the above findings IMPRESSION: Mild atherosclerotic disease in the carotid bifurcation bilaterally. No significant carotid or vertebral artery stenosis in the neck. Atherosclerotic calcification in the cavernous carotid bilaterally without significant stenosis. Atherosclerotic irregularity and stenosis involving the left A1, right M1, and bilateral posterior cerebral artery segments. No intracranial large vessel occlusion. These results were called by telephone at the time of interpretation on 10/15/2016 at 10:11 am to Dr. Jonna Munro , who verbally acknowledged these results. Electronically Signed   By: Marlan Palau M.D.   On: 10/15/2016 10:12   Ct Angio Neck W Or Wo Contrast  Result Date: 10/15/2016 CLINICAL DATA:  Aphasia.  Code stroke EXAM: CT ANGIOGRAPHY HEAD AND NECK TECHNIQUE: Multidetector CT imaging of the head and neck was performed using the standard protocol during bolus administration of intravenous contrast. Multiplanar CT image reconstructions and MIPs were obtained to evaluate the vascular anatomy. Carotid stenosis measurements (when applicable) are obtained utilizing NASCET  criteria, using the distal internal carotid diameter as the denominator. CONTRAST:  50 mL Isovue 370 IV COMPARISON:  CT head 10/15/2016 FINDINGS: CTA NECK FINDINGS Aortic arch: Mild atherosclerotic disease in the aortic arch. Negative for aneurysm. Proximal great vessels patent. Right carotid system: Mild atherosclerotic calcification of the right carotid bifurcation without significant stenosis. Left carotid system: Mild atherosclerotic calcification at the carotid bifurcation without significant stenosis. Vertebral arteries: Both vertebral arteries are patent to the basilar without significant stenosis. Skeleton: Marked  cervical kyphosis. Advanced multilevel spondylosis and spurring present multilevel spinal stenosis due to spurring. Other neck: Negative for mass or adenopathy Upper chest: Lung apices clear. Calcified granuloma right upper lobe. Review of the MIP images confirms the above findings CTA HEAD FINDINGS Anterior circulation: Atherosclerotic calcification in the cavernous carotid bilaterally without significant carotid stenosis. Anterior and middle cerebral arteries patent bilaterally. Atherosclerotic irregularity in the left A2 segment and in the right M1 segment. No large vessel occlusion. Posterior circulation: Both vertebral arteries patent to the basilar. PICA patent. Basilar widely patent. Superior cerebellar and posterior cerebral arteries patent bilaterally. Moderate stenosis of the posterior cerebral artery bilaterally. Venous sinuses: Patent Anatomic variants: None Delayed phase: Not perform Review of the MIP images confirms the above findings IMPRESSION: Mild atherosclerotic disease in the carotid bifurcation bilaterally. No significant carotid or vertebral artery stenosis in the neck. Atherosclerotic calcification in the cavernous carotid bilaterally without significant stenosis. Atherosclerotic irregularity and stenosis involving the left A1, right M1, and bilateral posterior cerebral  artery segments. No intracranial large vessel occlusion. These results were called by telephone at the time of interpretation on 10/15/2016 at 10:11 am to Dr. Jonna Munro , who verbally acknowledged these results. Electronically Signed   By: Marlan Palau M.D.   On: 10/15/2016 10:12   Ct Head Code Stroke Wo Contrast`  Result Date: 10/15/2016 CLINICAL DATA:  Code stroke.  Aphasia EXAM: CT HEAD WITHOUT CONTRAST TECHNIQUE: Contiguous axial images were obtained from the base of the skull through the vertex without intravenous contrast. COMPARISON:  MRI 03/08/2016, CT head 03/07/2016 FINDINGS: Brain: Moderate atrophy. Hypodensity in the cerebral white matter, stable from prior studies. Chronic lacunar infarction left thalamus and posterior limb internal capsule. Chronic ischemia in the right thalamus. Negative for acute infarct.  Negative for hemorrhage mass or edema. Vascular: Atherosclerotic disease.  Negative for hyperdense vessel. Skull: Negative Sinuses/Orbits: Mucosal edema in the sphenoid sinus. Bilateral cataract removal. No orbital mass. Other: None ASPECTS (Alberta Stroke Program Early CT Score) - Ganglionic level infarction (caudate, lentiform nuclei, internal capsule, insula, M1-M3 cortex): 7 - Supraganglionic infarction (M4-M6 cortex): 3 Total score (0-10 with 10 being normal): 10 IMPRESSION: 1. Negative for acute intracranial abnormality 2. ASPECTS is 10 3. Atrophy and chronic microvascular ischemia. These results were called by telephone at the time of interpretation on 10/15/2016 at 9:31 am to [facility], who verbally acknowledged these results. Electronically Signed   By: Marlan Palau M.D.   On: 10/15/2016 09:33       Assessment: 83 y.o. male   83 yo male presented with acute slurred speech and L weakness with normal CTH and no LVO on CTA received IVTPA in ED, most likely acute lacunar stroke -admit to NSICU with IVTPA PROTOCOL -MRI brain -stroke work up ( A1C, lipide profile, Echo) -PT/OT  and speech eval -FU cth in 24H ordered  -Gi and DVT prophylaxis -family was updated about plan   Jonna Munro, MD [facility]

## 2016-10-15 NOTE — ED Notes (Addendum)
Per pt family no pork and no beef per religious beliefs.

## 2016-10-15 NOTE — ED Notes (Signed)
Stroke swallow screen on hold per stroke RN Sharia Reeve

## 2016-10-16 ENCOUNTER — Observation Stay (HOSPITAL_COMMUNITY): Payer: Medicare Other

## 2016-10-16 DIAGNOSIS — Z7982 Long term (current) use of aspirin: Secondary | ICD-10-CM | POA: Diagnosis not present

## 2016-10-16 DIAGNOSIS — I6302 Cerebral infarction due to thrombosis of basilar artery: Secondary | ICD-10-CM | POA: Diagnosis present

## 2016-10-16 DIAGNOSIS — G8194 Hemiplegia, unspecified affecting left nondominant side: Secondary | ICD-10-CM | POA: Diagnosis present

## 2016-10-16 DIAGNOSIS — Z8673 Personal history of transient ischemic attack (TIA), and cerebral infarction without residual deficits: Secondary | ICD-10-CM

## 2016-10-16 DIAGNOSIS — I10 Essential (primary) hypertension: Secondary | ICD-10-CM | POA: Diagnosis present

## 2016-10-16 DIAGNOSIS — E1165 Type 2 diabetes mellitus with hyperglycemia: Secondary | ICD-10-CM | POA: Diagnosis present

## 2016-10-16 DIAGNOSIS — R29706 NIHSS score 6: Secondary | ICD-10-CM | POA: Diagnosis present

## 2016-10-16 DIAGNOSIS — I6789 Other cerebrovascular disease: Secondary | ICD-10-CM | POA: Diagnosis present

## 2016-10-16 DIAGNOSIS — Z888 Allergy status to other drugs, medicaments and biological substances status: Secondary | ICD-10-CM | POA: Diagnosis not present

## 2016-10-16 DIAGNOSIS — Z79899 Other long term (current) drug therapy: Secondary | ICD-10-CM | POA: Diagnosis not present

## 2016-10-16 DIAGNOSIS — E785 Hyperlipidemia, unspecified: Secondary | ICD-10-CM

## 2016-10-16 DIAGNOSIS — R29810 Facial weakness: Secondary | ICD-10-CM | POA: Diagnosis present

## 2016-10-16 DIAGNOSIS — Z7984 Long term (current) use of oral hypoglycemic drugs: Secondary | ICD-10-CM | POA: Diagnosis not present

## 2016-10-16 DIAGNOSIS — E1159 Type 2 diabetes mellitus with other circulatory complications: Secondary | ICD-10-CM | POA: Diagnosis not present

## 2016-10-16 DIAGNOSIS — Z87891 Personal history of nicotine dependence: Secondary | ICD-10-CM | POA: Diagnosis not present

## 2016-10-16 DIAGNOSIS — Z880 Allergy status to penicillin: Secondary | ICD-10-CM | POA: Diagnosis not present

## 2016-10-16 DIAGNOSIS — R471 Dysarthria and anarthria: Secondary | ICD-10-CM | POA: Diagnosis present

## 2016-10-16 DIAGNOSIS — R4701 Aphasia: Secondary | ICD-10-CM | POA: Diagnosis present

## 2016-10-16 LAB — RAPID URINE DRUG SCREEN, HOSP PERFORMED
AMPHETAMINES: NOT DETECTED
BENZODIAZEPINES: NOT DETECTED
Barbiturates: NOT DETECTED
Cocaine: NOT DETECTED
Opiates: NOT DETECTED
Tetrahydrocannabinol: NOT DETECTED

## 2016-10-16 LAB — GLUCOSE, CAPILLARY
GLUCOSE-CAPILLARY: 131 mg/dL — AB (ref 65–99)
Glucose-Capillary: 270 mg/dL — ABNORMAL HIGH (ref 65–99)
Glucose-Capillary: 96 mg/dL (ref 65–99)

## 2016-10-16 LAB — LIPID PANEL
Cholesterol: 182 mg/dL (ref 0–200)
HDL: 42 mg/dL (ref >40)
LDL Cholesterol: 116 mg/dL — ABNORMAL HIGH (ref 0–99)
TRIGLYCERIDES: 118 mg/dL (ref <150)
Total CHOL/HDL Ratio: 4.3 RATIO
VLDL: 24 mg/dL (ref 0–40)

## 2016-10-16 LAB — HEMOGLOBIN A1C
HEMOGLOBIN A1C: 8.7 % — AB (ref 4.8–5.6)
Mean Plasma Glucose: 203 mg/dL

## 2016-10-16 MED ORDER — ASPIRIN 81 MG PO CHEW: 81.0000 mg | CHEWABLE_TABLET | Freq: Every day | ORAL | Status: DC

## 2016-10-16 MED ORDER — METOPROLOL SUCCINATE ER 100 MG PO TB24
100.0000 mg | ORAL_TABLET | Freq: Every day | ORAL | Status: DC
  Administered 2016-10-16 – 2016-10-18 (×3): 100 mg via ORAL
  Filled 2016-10-16: qty 2
  Filled 2016-10-16 (×2): qty 1

## 2016-10-16 MED ORDER — INSULIN ASPART 100 UNIT/ML ~~LOC~~ SOLN
0.0000 [IU] | Freq: Three times a day (TID) | SUBCUTANEOUS | Status: DC
  Administered 2016-10-16 (×2): 8 [IU] via SUBCUTANEOUS
  Administered 2016-10-17: 2 [IU] via SUBCUTANEOUS
  Administered 2016-10-17: 5 [IU] via SUBCUTANEOUS
  Administered 2016-10-18 (×2): 3 [IU] via SUBCUTANEOUS

## 2016-10-16 MED ORDER — INSULIN ASPART 100 UNIT/ML ~~LOC~~ SOLN: 0.0000 [IU] | Freq: Three times a day (TID) | SUBCUTANEOUS | Status: DC

## 2016-10-16 MED ORDER — ASPIRIN EC 325 MG PO TBEC
325.0000 mg | DELAYED_RELEASE_TABLET | Freq: Every day | ORAL | Status: DC
  Administered 2016-10-16 – 2016-10-18 (×3): 325 mg via ORAL
  Filled 2016-10-16 (×3): qty 1

## 2016-10-16 MED ORDER — METOPROLOL SUCCINATE ER 50 MG PO TB24: 200.0000 mg | ORAL_TABLET | Freq: Every day | ORAL | Status: DC

## 2016-10-16 MED ORDER — METFORMIN HCL 500 MG PO TABS
1000.0000 mg | ORAL_TABLET | Freq: Two times a day (BID) | ORAL | Status: DC
  Administered 2016-10-16 – 2016-10-18 (×4): 1000 mg via ORAL
  Filled 2016-10-16 (×4): qty 2

## 2016-10-16 MED ORDER — ADULT MULTIVITAMIN W/MINERALS CH
1.0000 | ORAL_TABLET | Freq: Every day | ORAL | Status: DC
  Administered 2016-10-16 – 2016-10-18 (×3): 1 via ORAL
  Filled 2016-10-16 (×3): qty 1

## 2016-10-16 MED ORDER — CLOPIDOGREL BISULFATE 75 MG PO TABS
75.0000 mg | ORAL_TABLET | Freq: Every day | ORAL | Status: DC
  Administered 2016-10-16 – 2016-10-18 (×3): 75 mg via ORAL
  Filled 2016-10-16 (×3): qty 1

## 2016-10-16 MED ORDER — PRAVASTATIN SODIUM 40 MG PO TABS
40.0000 mg | ORAL_TABLET | Freq: Every day | ORAL | Status: DC
  Administered 2016-10-16 – 2016-10-18 (×3): 40 mg via ORAL
  Filled 2016-10-16 (×3): qty 1

## 2016-10-16 MED ORDER — VITAMIN B-12 1000 MCG PO TABS
1000.0000 ug | ORAL_TABLET | Freq: Every day | ORAL | Status: DC
  Administered 2016-10-17 – 2016-10-18 (×2): 1000 ug via ORAL
  Filled 2016-10-16 (×2): qty 1

## 2016-10-16 MED ORDER — DONEPEZIL HCL 5 MG PO TABS
5.0000 mg | ORAL_TABLET | Freq: Every day | ORAL | Status: DC
  Administered 2016-10-16 – 2016-10-18 (×3): 5 mg via ORAL
  Filled 2016-10-16 (×3): qty 1

## 2016-10-16 MED ORDER — INSULIN ASPART 100 UNIT/ML ~~LOC~~ SOLN: 0.0000 [IU] | Freq: Every day | SUBCUTANEOUS | Status: DC

## 2016-10-16 NOTE — Care Management Note (Addendum)
Case Management Note  Patient Details  Name: Christopher [name] MRN: [id number] Date of Birth: 02/23/1926  Subjective/Objective:   Pt is an 83 y.o. male admitted to ED on 10/15/16 with slurred speech and weakness; TPA given. MRI shows acute L pons small vessel infarct and remote micro hemorrhages.   PTA, pt independent, lives with family.               Action/Plan: PT recommending HHPT; OT eval pending.  Speech recommending HHST.  Will follow for home health orders.    4/13 Addendum Lawerance Sabal RN CM Spoke with family, provided options for [facility]. They would like to use [facility] for [facility]. PCP is [facility] in [place].Referral made to [facility] to follow for [facility] orders. Anticipate DC with HH PT when stroke workup complete.   4/14 D Philander Ake RN DC orders in place with [facility] orders. Jermaine w [facility] notified. Patient family states he has a RW at home, decline other DME.    Expected Discharge Date:                  Expected Discharge Plan:  Home w Home Health Services  In-House Referral:     Discharge planning Services  CM Consult  Post Acute Care Choice:    Choice offered to:     DME Arranged:    DME Agency:     HH Arranged:    HH Agency:     Status of Service:  In process, will continue to follow  If discussed at Long Length of Stay Meetings, dates discussed:    Additional Comments:  Glennon Mac, RN 10/16/2016, 5:22 PM

## 2016-10-16 NOTE — Evaluation (Addendum)
Speech Language Pathology Evaluation Patient Details Name: Christopher [name] MRN: [id number] DOB: [birth year] Today's Date: 10/16/2016 Time: 1140-1200 SLP Time Calculation (min) (ACUTE ONLY): 20 min  Problem List:  Patient Active Problem List   Diagnosis Date Noted  . Stroke (HCC) 10/15/2016  . TIA (transient ischemic attack) 03/07/2016  . Acute CVA (cerebrovascular accident) (HCC) 07/03/2015   Past Medical History:  Past Medical History:  Diagnosis Date  . Arthritis   . Ataxia   . Diabetes mellitus without complication (HCC)    controlled  . Dyslipidemia   . Hypertension    somewhat controlled  . Stroke (HCC)   . TIA (transient ischemic attack)    Past Surgical History: History reviewed. No pertinent surgical history. HPI:  83 year old male admitted 10/15/16 due to difficulty speaking, slurred speech, and left side weakness. PMH significant for HTN, DM, CVA, TIA. CT negative, MRI - small vessel infarct left pons   Assessment / Plan / Recommendation Clinical Impression  Pt primary language is Urdu, however, pt was able to answer several questions from the MMSE/MoCA (Mini-mental state exam/Montreal Cognitive Assessment). Pt had difficulty with naming category members, however, this is likely due to language barrier rather than word finding deficits. Pt also had difficulty with immediate and delayed recall of unrelated words, and reports that his "memory is going downhill". Pt was able to follow multistep directions and answer yes/no questions without difficulty. Recommend home health ST evaluation, to insure safety at home, given decreased recall. In addition, pt's wife just died (Monday 10-21-2016),  MD and RN aware of recommendations.    SLP Assessment  SLP Recommendation/Assessment: All further Speech Language Pathology  needs can be addressed in the next venue of care SLP Visit Diagnosis: Cognitive communication deficit (R41.841)    Follow Up Recommendations  Home health SLP         SLP Evaluation Cognition  Overall Cognitive Status: History of cognitive impairments - at baseline Arousal/Alertness: Awake/alert Orientation Level: Oriented X4 Attention: Focused;Sustained;Selective Focused Attention: Appears intact Sustained Attention: Appears intact Selective Attention: Impaired Selective Attention Impairment: Verbal basic Memory: Impaired Memory Impairment: Storage deficit;Retrieval deficit;Decreased recall of new information;Decreased short term memory Decreased Short Term Memory: Verbal basic       Comprehension  Auditory Comprehension Overall Auditory Comprehension: Appears within functional limits for tasks assessed Visual Recognition/Discrimination Discrimination: Not tested Reading Comprehension Reading Status: Not tested    Expression Expression Primary Mode of Expression: Verbal Verbal Expression Overall Verbal Expression: Appears within functional limits for tasks assessed Written Expression Dominant Hand: Right Written Expression: Not tested   Oral / Motor  Oral Motor/Sensory Function Overall Oral Motor/Sensory Function: Mild impairment - resulting in slight dysarthria. Motor Speech Overall Motor Speech: Impaired Respiration: Within functional limits Phonation: Normal Resonance: Within functional limits Articulation: Impaired Level of Impairment: Conversation Intelligibility: Intelligibility reduced (? dysarthria vs Urdu accent) Word: 75-100% accurate Phrase: 75-100% accurate Sentence: 75-100% accurate Motor Planning: Witnin functional limits Motor Speech Errors: Not applicable   GO                   [name] [facility], CCC-SLP [phone]  [name] 10/16/2016, 12:23 PM

## 2016-10-16 NOTE — Evaluation (Signed)
Physical Therapy Evaluation Patient Details Name: Christopher [name] MRN: [id number] DOB: Jun 26, 1926 Today's Date: 10/16/2016   History of Present Illness  Pt is an 83 y.o. male admitted to ED on 10/15/16 with slurred speech and weakness; tPA given. MRI shows acute L pons small vessel infarct and remote micro hemorrhages. Pertinent PMH includes previous CVAs, TIA, HTN, DM, ataxia, arthritis.   Clinical Impression  Pt presents to PT with generalized weakness (RLE > LLE), impaired balance, and an overall decrease in functional mobility secondary to above. Pt able to communicate in [place], but son present and translated at times. PTA, pt indep with all functional mobility and lives at home with children; children able to provide 24/7 supervision if needed. Today, pt able to amb to bathroom with RW and min guard, and also amb 100' in hallway with minA for RUE HHA with no LOB. Pt motivated to participate with PT. Pt and family educ on HHPT, DME needs, and supervision needs. Pt would benefit from continued acute PT to maximize functional mobility and independence prior to d/c home.    Follow Up Recommendations Home health PT;Supervision for mobility/OOB    Equipment Recommendations  None recommended by PT    Recommendations for Other Services OT consult     Precautions / Restrictions Precautions Precautions: Fall Restrictions Weight Bearing Restrictions: No      Mobility  Bed Mobility Overal bed mobility: Needs Assistance Bed Mobility: Supine to Sit     Supine to sit: Min assist     General bed mobility comments: Pt sat with min guard for balance, requiring minA to scoot to EOB  Transfers Overall transfer level: Needs assistance Equipment used: 1 person hand held assist;Rolling walker (2 wheeled) Transfers: Sit to/from Stand Sit to Stand: Min assist         General transfer comment: Stood x2 from bed and toilet to RW with min guard for balance. Sat in chair with HHA and minA for  balance.   Ambulation/Gait Ambulation/Gait assistance: Min assist Ambulation Distance (Feet): 100 Feet (+20) Assistive device: 1 person hand held assist;Rolling walker (2 wheeled) Gait Pattern/deviations: Shuffle;Decreased step length - right;Step-through pattern;Decreased stride length Gait velocity: Decreased Gait velocity interpretation: Below normal speed for age/gender General Gait Details: Amb 20' in room with slowed shuffling gait using RW and min guard. Amb 100' in hallway with RUE HHA and minA for balance; no LOB. Decreased step length with RLE. Pt stopped frequently when asked a question to listen and focus on answer. Amb a few feet to chair with no assist, requiring min guard for balance and pt furniture surfing to get to chair.  Stairs            Wheelchair Mobility    Modified Rankin (Stroke Patients Only) Modified Rankin (Stroke Patients Only) Pre-Morbid Rankin Score: No symptoms Modified Rankin: Moderately severe disability     Balance Overall balance assessment: Needs assistance Sitting-balance support: No upper extremity supported;Feet supported Sitting balance-Leahy Scale: Fair     Standing balance support: No upper extremity supported Standing balance-Leahy Scale: Fair Standing balance comment: Able to stand with min guard for balance and no UE support; amb with HHA for balance.                             Pertinent Vitals/Pain Pain Assessment: No/denies pain    Home Living Family/patient expects to be discharged to:: Private residence Living Arrangements: Children Available Help at Discharge: Family;Available 24  hours/day Type of Home: House Home Access: Ramped entrance     Home Layout: Two level;Bed/bath upstairs Home Equipment: Walker - 2 wheels      Prior Function Level of Independence: Independent         Comments: Pt reports indep with all functional mobility and ADLs; daughter cooks. Wife recently passed away.      Hand Dominance   Dominant Hand: Right    Extremity/Trunk Assessment   Upper Extremity Assessment Upper Extremity Assessment: Overall WFL for tasks assessed    Lower Extremity Assessment Lower Extremity Assessment: Generalized weakness       Communication   Communication: HOH;Prefers language other than English (Pt speaks [place]; native language is Urdu)  Cognition Arousal/Alertness: Awake/alert Behavior During Therapy: WFL for tasks assessed/performed Overall Cognitive Status: Within Functional Limits for tasks assessed                                        General Comments General comments (skin integrity, edema, etc.): VSS    Exercises     Assessment/Plan    PT Assessment Patient needs continued PT services  PT Problem List Decreased strength;Decreased knowledge of use of DME;Decreased balance;Decreased mobility       PT Treatment Interventions DME instruction;Gait training;Stair training;Functional mobility training;Therapeutic activities;Therapeutic exercise;Balance training;Patient/family education    PT Goals (Current goals can be found in the Care Plan section)  Acute Rehab PT Goals Patient Stated Goal: Return home PT Goal Formulation: With patient/family Time For Goal Achievement: 10/30/16 Potential to Achieve Goals: Good    Frequency Min 4X/week   Barriers to discharge        Co-evaluation               End of Session Equipment Utilized During Treatment: Gait belt Activity Tolerance: Patient tolerated treatment well Patient left: in chair;with family/visitor present;with call bell/phone within reach;with chair alarm set Nurse Communication: Mobility status PT Visit Diagnosis: Other abnormalities of gait and mobility (R26.89);Muscle weakness (generalized) (M62.81)    Time: 1202-1226 PT Time Calculation (min) (ACUTE ONLY): 24 min   Charges:   PT Evaluation $PT Eval Low Complexity: 1 Procedure PT Treatments $Gait  Training: 8-22 mins   PT G Codes:       [name], SPT Office-[phone]  [name] 10/16/2016, 1:15 PM

## 2016-10-16 NOTE — Plan of Care (Signed)
Problem: Pain Managment: Goal: General experience of comfort will improve Outcome: Progressing Patient in bed resting, no complaints of pain or discomfort.  Pt states he is feeling better.  Family at bedside providing positive emotional support.  All questions and concerns addressed.  Patient progressing towards goal.

## 2016-10-16 NOTE — Progress Notes (Signed)
OT Cancellation Note  Patient Details Name: Christopher [name] MRN: [id number] DOB: 25-May-1926   Cancelled Treatment:    Reason Eval/Treat Not Completed: Patient not medically ready. Pt with strict bedrest orders. Please update activity orders when appropriate for therapy. thanks  [facility] Hong Moring, OT/L  [phone] 10/16/2016 10/16/2016, 7:50 AM

## 2016-10-16 NOTE — Progress Notes (Signed)
SLP Cancellation Note  Patient Details Name: Christopher [name] MRN: [id number] DOB: [birth year]   Cancelled treatment:       Reason Eval/Treat Not Completed: Patient at procedure or test/unavailable - Pt currently off unit for MRI. Will continue efforts.  Celia B. Murvin Natal [facility], CCC-SLP [phone]  [name] 10/16/2016, 9:54 AM

## 2016-10-16 NOTE — Progress Notes (Signed)
STROKE TEAM PROGRESS NOTE   HISTORY OF PRESENT ILLNESS (per record) Domenique Southers is an 83 y.o. male with PMH significant for HTN, DM, CVA who presented to the hospital via [facility] as stroke code. Last year. There is a. Pt and family resported that pt started having difficulty speaking with sluured speech and difficulty getting his words out started an hour prior to calling 911 and was getting worse. Pt was last seen normal by family at 7 am this morning 4/11/208 (LKW). Pt also reported L weakness, denied visual changes, chest pain or SOB. In ED, initial [facility] showed no acute changes, Dr. Lavonna Monarch evaluated pt for IVTPA and determined that he was a candidate with no contraindication. IVTPA was discussed with pt and his son and consent was obtained from both. CTA showed no LVO so no intervention was needed. IVTPA started at 9.49 AM, pt was admitted afterwards to ICU for stroke care. prior to admission, patient had no significant disability/able to carry out all usual activities.    SUBJECTIVE (INTERVAL HISTORY) Initially no family at bedside. Later his son arrived and I had long discussion with son about pt condition and treatment plan. Pt stated that he is back to baseline, but I still feel he has mild dysarthria, but he said it is because of his old age. Of course, he is from [place] so he has accent. Hard to tell it is accent or dysarthria.    OBJECTIVE Temp:  [97.3 F (36.3 C)-98.5 F (36.9 C)] 98.2 F (36.8 C) (04/12 1200) Pulse Rate:  [67-105] 86 (04/12 1400) Cardiac Rhythm: Normal sinus rhythm (04/12 1400) Resp:  [13-33] 21 (04/12 1400) BP: (118-173)/(66-95) 132/87 (04/12 1400) SpO2:  [94 %-100 %] 97 % (04/12 1400)  CBC:  Recent Labs Lab 10/15/16 0920 10/15/16 0928  WBC 8.6  --   NEUTROABS 6.4  --   HGB 14.1 15.0  HCT 41.8 44.0  MCV 86.5  --   PLT 200  --     Basic Metabolic Panel:  Recent Labs Lab 10/15/16 0920 10/15/16 0928  NA 136 137  K 4.0 4.2  CL 100* 102   CO2 21*  --   GLUCOSE 259* 261*  BUN 22* 27*  CREATININE 1.12 1.00  CALCIUM 9.3  --     Lipid Panel:    Component Value Date/Time   CHOL 182 10/16/2016 0206   TRIG 118 10/16/2016 0206   HDL 42 10/16/2016 0206   CHOLHDL 4.3 10/16/2016 0206   VLDL 24 10/16/2016 0206   LDLCALC 116 (H) 10/16/2016 0206   HgbA1c:  Lab Results  Component Value Date   HGBA1C 7.9 (H) 03/08/2016   Urine Drug Screen:    Component Value Date/Time   LABOPIA NONE DETECTED 10/15/2016 2345   COCAINSCRNUR NONE DETECTED 10/15/2016 2345   LABBENZ NONE DETECTED 10/15/2016 2345   AMPHETMU NONE DETECTED 10/15/2016 2345   THCU NONE DETECTED 10/15/2016 2345   LABBARB NONE DETECTED 10/15/2016 2345    Alcohol Level No results found for: ETH  IMAGING I have personally reviewed the radiological images below and agree with the radiology interpretations.  Ct Head Code Stroke Wo Contrast` 10/15/2016 1. Negative for acute intracranial abnormality 2. ASPECTS is 10 3. Atrophy and chronic microvascular ischemia.   Ct Angio Head W Or Wo Contrast Ct Angio Neck W Or Wo Contrast 10/15/2016 Mild atherosclerotic disease in the carotid bifurcation bilaterally. No significant carotid or vertebral artery stenosis in the neck. Atherosclerotic calcification in the cavernous carotid bilaterally  without significant stenosis. Atherosclerotic irregularity and stenosis involving the left A1, right M1, and bilateral posterior cerebral artery segments. No intracranial large vessel occlusion.   Mr Brain Wo Contrast 10/16/2016 1. Acute small vessel infarct in the left pons. 2. Chronic small vessel ischemia with white matter gliosis and remote lacunar infarcts. 3. Remote micro hemorrhages in a hypertensive pattern. 4. Left sphenoid sinusitis.   TTE pending   PHYSICAL EXAM  Temp:  [97.5 F (36.4 C)-98.5 F (36.9 C)] 98.2 F (36.8 C) (04/12 1804) Pulse Rate:  [67-105] 72 (04/12 1804) Resp:  [13-33] 16 (04/12 1804) BP:  (118-173)/(66-95) 130/91 (04/12 1804) SpO2:  [94 %-100 %] 100 % (04/12 1804)  General - Well nourished, well developed, in no apparent distress.  Ophthalmologic - Fundi not visualized due to eye movement.  Cardiovascular - Regular rate and rhythm.  Mental Status -  Level of arousal and orientation to time, place, and person were intact. Language including expression, naming, repetition, comprehension was assessed and found intact. Mild dysarthria vs. accent. Fund of Knowledge was assessed and was intact.  Cranial Nerves II - XII - II - Visual field intact OU. III, IV, VI - Extraocular movements intact. V - Facial sensation intact bilaterally. VII - right nasolabial fold flattening. VIII - Hearing & vestibular intact bilaterally. X - Palate elevates symmetrically. XI - Chin turning & shoulder shrug intact bilaterally. XII - Tongue protrusion intact.  Motor Strength - The patient's strength was normal in all extremities and pronator drift was absent.  Bulk was normal and fasciculations were absent.   Motor Tone - Muscle tone was assessed at the neck and appendages and was normal.  Reflexes - The patient's reflexes were 1+ in all extremities and he had no pathological reflexes.  Sensory - Light touch, temperature/pinprick were assessed and were symmetrical.    Coordination - The patient had normal movements in the hands and feet with no ataxia or dysmetria.  Tremor was absent.  Gait and Station - deferred.   ASSESSMENT/PLAN Mr. Tajuan Dufault is a 83 y.o. male with history of HTN, DM, CVA presenting with Difficulty speaking and getting words out and left-sided weakness. He received IV t-PA  10/15/2016 at 0952.   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 Aspects 10. Atrophy. Small vessel disease.   CTA head and neck  No LVO. Atherosclerosis. L A1, R M1 and  B PCA stenosis.   MRI Left pontine infarct. Old lacunes. Remote microhemorrhages, hypertensive pattern.   2D Echo  pending  LDL 116  HgbA1c pending  SCDs for VTE prophylaxis  Diet Heart Room service appropriate? Yes; Fluid consistency: Thin  aspirin 81 mg daily and clopidogrel 75 mg daily prior to admission, post tPA now on aspirin 325 mg daily and clopidogrel 75 mg daily. Continue DAPT until he follows with Dr. Sherryll Burger in [place].  Patient counseled to be compliant with his antithrombotic medications  Ongoing aggressive stroke risk factor management  Therapy recommendations:  HH PT, HH SLP  Disposition:  Anticipate return home  Followed by Dr. Sherryll Burger in [place] prior to admission, continue follow up with him at discharge  Hypertension  Per post TPA guidelines 24 hours   Stable thi sam  Long-term BP goal normotensive  Hyperlipidemia  Home meds:  Pravachol 40, resumed in hospital  LDL 116, goal < 70  Continue statin at discharge  Diabetes  HgbA1c pending, goal < 7.0  Glucose is elevated in the 200s  SSI  CBG monitoring  Other Stroke Risk Factors  Advanced age  Former Cigarette smoker  Hx stroke/TIA  06/2015 L thalamic/L PLIC infarct in setting of hypertensive emergency, R sided weakness  02/2016 - ataxia, falling - MRI, CUS, TTE negative - on ASA 325  Hospital day # 0  This patient is critically ill due to pontine stroke s/p TPA, hyperglycemia, and a history of stroke and at significant risk of neurological worsening, death form recurrent stroke, hemorrhagic transmission, seizure, heart failure. This patient's care requires constant monitoring of vital signs, hemodynamics, respiratory and cardiac monitoring, review of multiple databases, neurological assessment, discussion with family, other specialists and medical decision making of high complexity. I spent 40 minutes of neurocritical care time in the care of this patient. I had a long discussion with  son at bedside, updated him about patient currently condition, treatment plan, and the discharge follow-up.  Marvel Plan, MD PhD Stroke Neurology 10/16/2016 7:27 PM    To contact Stroke Continuity provider, please refer to [email or web address]. After hours, contact General Neurology Metabolic processes

## 2016-10-17 ENCOUNTER — Encounter (HOSPITAL_COMMUNITY): Payer: Self-pay

## 2016-10-17 LAB — GLUCOSE, CAPILLARY
GLUCOSE-CAPILLARY: 109 mg/dL — AB (ref 65–99)
GLUCOSE-CAPILLARY: 143 mg/dL — AB (ref 65–99)
Glucose-Capillary: 147 mg/dL — ABNORMAL HIGH (ref 65–99)
Glucose-Capillary: 212 mg/dL — ABNORMAL HIGH (ref 65–99)

## 2016-10-17 NOTE — Progress Notes (Addendum)
STROKE TEAM PROGRESS NOTE   SUBJECTIVE (INTERVAL HISTORY) His sons are at bedside. Pt neuro stable. Still has dysarthria but no neuro changes. Pending TTE.     OBJECTIVE Temp:  [97.5 F (36.4 C)-97.9 F (36.6 C)] 97.8 F (36.6 C) (04/13 1751) Pulse Rate:  [62-77] 74 (04/13 1751) Cardiac Rhythm: Normal sinus rhythm (04/13 2015) Resp:  [16-18] 16 (04/13 1751) BP: (130-142)/(63-83) 138/83 (04/13 1751) SpO2:  [97 %-99 %] 99 % (04/13 1751)  CBC:   Recent Labs Lab 10/15/16 0920 10/15/16 0928  WBC 8.6  --   NEUTROABS 6.4  --   HGB 14.1 15.0  HCT 41.8 44.0  MCV 86.5  --   PLT 200  --     Basic Metabolic Panel:   Recent Labs Lab 10/15/16 0920 10/15/16 0928  NA 136 137  K 4.0 4.2  CL 100* 102  CO2 21*  --   GLUCOSE 259* 261*  BUN 22* 27*  CREATININE 1.12 1.00  CALCIUM 9.3  --     Lipid Panel:     Component Value Date/Time   CHOL 182 10/16/2016 0206   TRIG 118 10/16/2016 0206   HDL 42 10/16/2016 0206   CHOLHDL 4.3 10/16/2016 0206   VLDL 24 10/16/2016 0206   LDLCALC 116 (H) 10/16/2016 0206   HgbA1c:  Lab Results  Component Value Date   HGBA1C 8.7 (H) 10/16/2016   Urine Drug Screen:     Component Value Date/Time   LABOPIA NONE DETECTED 10/15/2016 2345   COCAINSCRNUR NONE DETECTED 10/15/2016 2345   LABBENZ NONE DETECTED 10/15/2016 2345   AMPHETMU NONE DETECTED 10/15/2016 2345   THCU NONE DETECTED 10/15/2016 2345   LABBARB NONE DETECTED 10/15/2016 2345    Alcohol Level No results found for: ETH  IMAGING I have personally reviewed the radiological images below and agree with the radiology interpretations.  Ct Head Code Stroke Wo Contrast` 10/15/2016 1. Negative for acute intracranial abnormality 2. ASPECTS is 10 3. Atrophy and chronic microvascular ischemia.   Ct Angio Head W Or Wo Contrast Ct Angio Neck W Or Wo Contrast 10/15/2016 Mild atherosclerotic disease in the carotid bifurcation bilaterally. No significant carotid or vertebral artery stenosis  in the neck. Atherosclerotic calcification in the cavernous carotid bilaterally without significant stenosis. Atherosclerotic irregularity and stenosis involving the left A1, right M1, and bilateral posterior cerebral artery segments. No intracranial large vessel occlusion.   Mr Brain Wo Contrast 10/16/2016 1. Acute small vessel infarct in the left pons. 2. Chronic small vessel ischemia with white matter gliosis and remote lacunar infarcts. 3. Remote micro hemorrhages in a hypertensive pattern. 4. Left sphenoid sinusitis.   TTE pending   PHYSICAL EXAM  Temp:  [97.5 F (36.4 C)-97.9 F (36.6 C)] 97.8 F (36.6 C) (04/13 1751) Pulse Rate:  [62-77] 74 (04/13 1751) Resp:  [16-18] 16 (04/13 1751) BP: (130-142)/(63-83) 138/83 (04/13 1751) SpO2:  [97 %-99 %] 99 % (04/13 1751)  General - Well nourished, well developed, in no apparent distress.  Ophthalmologic - Fundi not visualized due to eye movement.  Cardiovascular - Regular rate and rhythm.  Mental Status -  Level of arousal and orientation to time, place, and person were intact. Language including expression, naming, repetition, comprehension was assessed and found intact. Mild dysarthria vs. accent. Fund of Knowledge was assessed and was intact.  Cranial Nerves II - XII - II - Visual field intact OU. III, IV, VI - Extraocular movements intact. V - Facial sensation intact bilaterally. VII - right nasolabial  fold flattening. VIII - Hearing & vestibular intact bilaterally. X - Palate elevates symmetrically. XI - Chin turning & shoulder shrug intact bilaterally. XII - Tongue protrusion intact.  Motor Strength - The patient's strength was normal in all extremities and pronator drift was absent.  Bulk was normal and fasciculations were absent.   Motor Tone - Muscle tone was assessed at the neck and appendages and was normal.  Reflexes - The patient's reflexes were 1+ in all extremities and he had no pathological reflexes.  Sensory  - Light touch, temperature/pinprick were assessed and were symmetrical.    Coordination - The patient had normal movements in the hands and feet with no ataxia or dysmetria.  Tremor was absent.  Gait and Station - deferred.   ASSESSMENT/PLAN Mr. Christopher [name] is a 83 y.o. male with history of HTN, DM, CVA presenting with Difficulty speaking and getting words out and left-sided weakness. He received IV t-PA  10/15/2016 at 0952.   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 Aspects 10. Atrophy. Small vessel disease.   CTA head and neck  No LVO. Atherosclerosis. L A1, R M1 and B PCA stenosis.   MRI Left pontine infarct. Old lacunes. Remote microhemorrhages, hypertensive pattern.   2D Echo  pending  LDL 116  HgbA1c 8.7  SCDs for VTE prophylaxis Diet heart healthy/carb modified Room service appropriate? Yes; Fluid consistency: Thin  aspirin 81 mg daily and clopidogrel 75 mg daily prior to admission, post tPA now on aspirin 325 mg daily and clopidogrel 75 mg daily. Continue DAPT until he follows with Dr. Sherryll Burger in [place].  Patient counseled to be compliant with his antithrombotic medications  Ongoing aggressive stroke risk factor management  Therapy recommendations:  HH PT, HH SLP  Disposition:  Anticipate return home  Followed by Dr. Sherryll Burger in [place] prior to admission, continue follow up with him at discharge  Hypertension  Per post TPA guidelines 24 hours   Stable thi sam  Long-term BP goal normotensive  Hyperlipidemia  Home meds:  Pravachol 40, resumed in hospital  LDL 116, goal < 70  Continue statin at discharge  Diabetes  HgbA1c 8.7, goal < 7.0  Glucose is elevated in the 200s  SSI  CBG monitoring  DM education  PCP close follow up  Other Stroke Risk Factors  Advanced age  Former Cigarette smoker  Hx  stroke/TIA  06/2015 L thalamic/L PLIC infarct in setting of hypertensive emergency, R sided weakness  02/2016 - ataxia, falling - MRI, CUS, TTE negative - on ASA 325  Hospital day # 1  Marvel Plan, MD PhD Stroke Neurology 10/17/2016 11:04 PM    To contact Stroke Continuity provider, please refer to [email or web address]. After hours, contact General Neurology Metabolic processes

## 2016-10-17 NOTE — Progress Notes (Signed)
Physical Therapy Treatment Patient Details Name: Christopher [name] MRN: [id number] DOB: May 05, 1926 Today's Date: 10/17/2016    History of Present Illness Pt is an 83 y.o. male admitted to ED on 10/15/16 with slurred speech and weakness; tPA given. MRI shows acute L pons small vessel infarct and remote micro hemorrhages. Pertinent PMH includes previous CVAs, TIA, HTN, DM, ataxia, arthritis.     PT Comments    Pt with significantly decreased awareness of deficits when walking he reports he feels normal despite right foot drag, knee instability and listing/leaning to the right with his RW.  He is a high fall risk and will need someone with him at all times at home when he is up on his feet.    Follow Up Recommendations  Home health PT;Supervision for mobility/OOB     Equipment Recommendations  None recommended by PT    Recommendations for Other Services   NA     Precautions / Restrictions Precautions Precautions: Fall Precaution Comments: right sided weakness and lean    Mobility  Bed Mobility Overal bed mobility: Needs Assistance Bed Mobility: Supine to Sit;Sit to Supine     Supine to sit: Supervision Sit to supine: Supervision   General bed mobility comments: supervision for safety.   Transfers Overall transfer level: Needs assistance Equipment used: Rolling walker (2 wheeled) Transfers: Sit to/from Stand Sit to Stand: Min guard         General transfer comment: Min guard assist for safety with an immediate LOB right requiring assist to stabilize RW to prevent LOB.   Ambulation/Gait Ambulation/Gait assistance: Min assist Ambulation Distance (Feet): 120 Feet Assistive device: Rolling walker (2 wheeled) Gait Pattern/deviations: Step-through pattern;Decreased dorsiflexion - right;Decreased weight shift to left;Decreased step length - right;Staggering right;Drifts right/left Gait velocity: decreased Gait velocity interpretation: Below normal speed for age/gender General  Gait Details: Pt needs min assist with gait to both support his trunk for balance and steer/stabilize RW during gait.  He tends to tip to the right and his right leg shows signs of fatigue including decreased knee stability and right foot drag the further down the hallway we went.  He needed assist to keep all 4 points of the RW on the ground.        Balance Overall balance assessment: Needs assistance Sitting-balance support: Feet supported;No upper extremity supported Sitting balance-Leahy Scale: Good     Standing balance support: Bilateral upper extremity supported;Single extremity supported Standing balance-Leahy Scale: Fair Standing balance comment: static standing ok, dynamic requires near constant assit.                             Cognition Arousal/Alertness: Awake/alert Behavior During Therapy: WFL for tasks assessed/performed Overall Cognitive Status: Impaired/Different from baseline Area of Impairment: Safety/judgement;Awareness;Problem solving                         Safety/Judgement: Decreased awareness of safety;Decreased awareness of deficits Awareness: Intellectual Problem Solving: Difficulty sequencing;Requires verbal cues;Requires tactile cues General Comments: Pt, when asked if there is anything wrong with him he will say no (even if it is specific).  When we were walking, after I pointed out that his right leg was weak and he was leaning right he would agree and be able to expand on this information more.  I think there is a significant lack of awareness of his deficits as well as likely a language barrier.  Pertinent Vitals/Pain Pain Assessment: No/denies pain           PT Goals (current goals can now be found in the care plan section) Acute Rehab PT Goals Patient Stated Goal: Return home Progress towards PT goals: Progressing toward goals    Frequency    Min 4X/week      PT Plan Current plan remains appropriate        End of Session Equipment Utilized During Treatment: Gait belt Activity Tolerance: Patient tolerated treatment well Patient left: in bed;with call bell/phone within reach;with bed alarm set   PT Visit Diagnosis: Other abnormalities of gait and mobility (R26.89);Muscle weakness (generalized) (M62.81)     Time: 8119-1478 PT Time Calculation (min) (ACUTE ONLY): 13 min  Charges:  $Gait Training: 8-22 mins                          Vivia Rosenburg B. Dorothye Berni, PT, DPT [phone]   10/17/2016, 4:24 PM

## 2016-10-17 NOTE — Progress Notes (Signed)
Inpatient Diabetes Program Recommendations  AACE/ADA: New Consensus Statement on Inpatient Glycemic Control (2015)  Target Ranges:  Prepandial:   less than 140 mg/dL      Peak postprandial:   less than 180 mg/dL (1-2 hours)      Critically ill patients:  140 - 180 mg/dL   Lab Results  Component Value Date   GLUCAP 147 (H) 10/17/2016   HGBA1C 8.7 (H) 10/16/2016   Results for Christopher [name], Christopher [name] (MRN [id number]) as of 10/17/2016 09:46  Ref. Range 10/16/2016 00:08 10/16/2016 12:31 10/16/2016 1816 10/16/2016 21:46 10/17/2016 06:54  Glucose-Capillary Latest Ref Range: 65 - 99 mg/dL 829 (H) 562 (H)  Novolog 8 U No CBG recorded, Novolog 8 U 96 147 (H)  Novolog 2 U    Review of Glycemic Control  Diabetes history: DM2  Outpatient Diabetes medications: Glipizide 5 mg daily, Metformin 1000 mg BID  Current orders for Inpatient glycemic control: moderate correction scale Novolog 0-15 units TIDAC and 0-5 units QHS  Inpatient Diabetes Program Recommendations:   Noted A1C of 8.7% on 10/16/16.  Recommend patient follow up with PCP at discharge.  Thank you,  Kristine Linea, RN, MSN Diabetes Coordinator Inpatient Diabetes Program [phone] (Team Pager)

## 2016-10-17 NOTE — Progress Notes (Signed)
Occupational Therapy Treatment Patient Details Name: Christopher [name] MRN: [id number] DOB: Dec 16, 1925 Today's Date: 10/17/2016    History of present illness Pt is an 83 y.o. male admitted to ED on 10/15/16 with slurred speech and weakness; tPA given. MRI shows acute L pons small vessel infarct and remote micro hemorrhages. Pertinent PMH includes previous CVAs, TIA, HTN, DM, ataxia, arthritis.    OT comments  PTA, pt was independent with ADL and functional mobility. He will have 24 hour assistance available from family initially post-acute D/C. He currently requires min guard assist for standing grooming tasks and min assist for toilet transfers and LB ADL. He presents with generalized weakness and decreased balance impacting his ability to complete ADL at PLOF. He was able to use central and peripheral vision functionally during ADL tasks in hospital setting. Pt would benefit from continued OT services while admitted to improve independence with ADL and functional mobility. Recommend home health OT services for OT follow-up in order to maximize return to PLOF. Will continue to follow acutely.   Follow Up Recommendations  Home health OT;Supervision/Assistance - 24 hour    Equipment Recommendations  3 in 1 bedside commode    Recommendations for Other Services      Precautions / Restrictions Precautions Precautions: Fall Restrictions Weight Bearing Restrictions: No       Mobility Bed Mobility Overal bed mobility: Needs Assistance Bed Mobility: Supine to Sit     Supine to sit: Min guard     General bed mobility comments: Min guard assist overall for safety.  Transfers Overall transfer level: Needs assistance Equipment used: 1 person hand held assist;Rolling walker (2 wheeled) Transfers: Sit to/from Stand Sit to Stand: Min assist         General transfer comment: Min assist for steadying and balance.     Balance Overall balance assessment: Needs assistance Sitting-balance  support: No upper extremity supported;Feet supported Sitting balance-Leahy Scale: Good Sitting balance - Comments: Able to complete ADL tasks at EOB without LOB or UE support.   Standing balance support: No upper extremity supported Standing balance-Leahy Scale: Fair Standing balance comment: Able to stand at sink for grooming tasks without UE support. Did lean against sink for stability.                           ADL either performed or assessed with clinical judgement   ADL Overall ADL's : Needs assistance/impaired Eating/Feeding: Set up;Sitting   Grooming: Standing;Min guard   Upper Body Bathing: Set up;Sitting   Lower Body Bathing: Minimal assistance;Sit to/from stand   Upper Body Dressing : Set up;Sitting   Lower Body Dressing: Minimal assistance;Sit to/from stand   Toilet Transfer: Minimal assistance;Ambulation Toilet Transfer Details (indicate cue type and reason): Min handheld assist for stability. Toileting- Clothing Manipulation and Hygiene: Minimal assistance;Sit to/from stand       Functional mobility during ADLs: Minimal assistance General ADL Comments: Min handheld assist for safety and balance. Assistance primarily for steadying in standing position. Able to complete bimanual tasks well.     Vision Baseline Vision/History: Wears glasses Wears Glasses: Reading only Patient Visual Report: No change from baseline Additional Comments: Pt able to track during functional activities, demonstrated in tact peripheral vision on assessment and functionally. Able to read small print on self-care bottles.   Perception     Praxis Praxis Praxis tested?: Within functional limits    Cognition Arousal/Alertness: Awake/alert Behavior During Therapy: WFL for tasks assessed/performed Overall Cognitive  Status: Within Functional Limits for tasks assessed                                          Exercises     Shoulder Instructions        General Comments      Pertinent Vitals/ Pain       Pain Assessment: No/denies pain  Home Living Family/patient expects to be discharged to:: Private residence Living Arrangements: Children Available Help at Discharge: Family;Available 24 hours/day Type of Home: House Home Access: Ramped entrance     Home Layout: Two level;Bed/bath upstairs Alternate Level Stairs-Number of Steps: 7 Alternate Level Stairs-Rails: Right Bathroom Shower/Tub: Chief Strategy Officer: Standard Bathroom Accessibility: Yes   Home Equipment: Walker - 2 wheels          Prior Functioning/Environment Level of Independence: Independent        Comments: Pt reports indep with all functional mobility and ADLs; daughter cooks. Wife recently passed away.   Frequency  Min 2X/week        Progress Toward Goals  OT Goals(current goals can now be found in the care plan section)     Acute Rehab OT Goals Patient Stated Goal: Return home OT Goal Formulation: With patient/family Time For Goal Achievement: 10/31/16 Potential to Achieve Goals: Good ADL Goals Pt Will Perform Grooming: with modified independence;standing Pt Will Perform Lower Body Dressing: with modified independence;sit to/from stand Pt Will Transfer to Toilet: with modified independence;ambulating;regular height toilet Pt Will Perform Toileting - Clothing Manipulation and hygiene: with modified independence;sit to/from stand Pt Will Perform Tub/Shower Transfer: with modified independence;Tub transfer;3 in 1;ambulating Pt/caregiver will Perform Home Exercise Program: Both right and left upper extremity;Independently;With written HEP provided;Increased strength  Plan      Co-evaluation                 End of Session Equipment Utilized During Treatment: Gait belt  OT Visit Diagnosis: Unsteadiness on feet (R26.81);Muscle weakness (generalized) (M62.81)   Activity Tolerance Patient tolerated treatment well   Patient  Left in chair;with call bell/phone within reach;with chair alarm set;with family/visitor present   Nurse Communication Mobility status        Time: 8295-6213 OT Time Calculation (min): 22 min  Charges: OT General Charges $OT Visit: 1 Procedure OT Evaluation $OT Eval Moderate Complexity: 1 Procedure  Doristine Section, MS OTR/L  Pager: [phone]    Kellin Bartling A Alvine Mostafa 10/17/2016, 8:48 AM

## 2016-10-18 ENCOUNTER — Inpatient Hospital Stay (HOSPITAL_COMMUNITY): Payer: Medicare Other

## 2016-10-18 DIAGNOSIS — I6789 Other cerebrovascular disease: Secondary | ICD-10-CM

## 2016-10-18 LAB — ECHOCARDIOGRAM COMPLETE
EERAT: 8.62
EWDT: 271 ms
FS: 27 % — AB (ref 28–44)
Height: 62 in
IVS/LV PW RATIO, ED: 0.9
LA ID, A-P, ES: 30 mm
LA diam end sys: 30 mm
LA diam index: 1.76 cm/m2
LA vol A4C: 31.2 ml
LA vol index: 16.4 mL/m2
LA vol: 27.8 mL
LV E/e' medial: 8.62
LV PW d: 9.99 mm — AB (ref 0.6–1.1)
LV TDI E'LATERAL: 8.32
LV TDI E'MEDIAL: 5.36
LV e' LATERAL: 8.32 cm/s
LVEEAVG: 8.62
LVOT SV: 60 mL
LVOT VTI: 17.2 cm
LVOT area: 3.46 cm2
LVOT peak vel: 91.7 cm/s
LVOTD: 21 mm
MV Dec: 271
MV Peak grad: 2 mmHg
MV pk A vel: 119 m/s
MVAP: 2.65 cm2
MVPKEVEL: 71.7 m/s
MVSPHT: 83 ms
RV LATERAL S' VELOCITY: 18.5 cm/s
TAPSE: 23.5 mm
Weight: 2419.77 oz

## 2016-10-18 LAB — GLUCOSE, CAPILLARY
GLUCOSE-CAPILLARY: 151 mg/dL — AB (ref 65–99)
GLUCOSE-CAPILLARY: 156 mg/dL — AB (ref 65–99)

## 2016-10-18 MED ORDER — METOPROLOL SUCCINATE ER 100 MG PO TB24: 100.0000 mg | ORAL_TABLET | Freq: Every day | ORAL | 5 refills | Status: DC

## 2016-10-18 MED ORDER — ASPIRIN 325 MG PO TBEC: 325.0000 mg | DELAYED_RELEASE_TABLET | Freq: Every day | ORAL | 0 refills | Status: DC

## 2016-10-18 NOTE — Discharge Summary (Signed)
Stroke Discharge Summary  Patient ID: Christopher [name]   MRN: [id number]      DOB: 1926-03-28  Date of Admission: 10/15/2016 Date of Discharge: 10/18/2016  Attending Physician:  Marvel Plan, MD, Stroke MD Consultant(s):    None  Patient's PCP:  No PCP Per Patient  DISCHARGE DIAGNOSIS: Acute small vessel infarct in the left pons treated with t-PA. Active Problems:   Stroke (HCC)   DM, uncontrolled   HTN   HLD   BMI: Body mass index is 27.66 kg/m.  Past Medical History:  Diagnosis Date  . Arthritis   . Ataxia   . Diabetes mellitus without complication (HCC)    controlled  . Dyslipidemia   . Hypertension    somewhat controlled  . Stroke (HCC)   . TIA (transient ischemic attack)    History reviewed. No pertinent surgical history.  Allergies as of 10/18/2016      Reactions   Hydrochlorothiazide    Unknown reaction   Penicillins Other (See Comments)   Reaction unknown.       Medication List    STOP taking these medications   amLODipine 10 MG tablet Commonly known as:  NORVASC   aspirin 81 MG chewable tablet Replaced by:  aspirin 325 MG EC tablet   enalapril 20 MG tablet Commonly known as:  VASOTEC     TAKE these medications   aspirin 325 MG EC tablet Take 1 tablet (325 mg total) by mouth daily. Start taking on:  10/19/2016 Replaces:  aspirin 81 MG chewable tablet   clopidogrel 75 MG tablet Commonly known as:  PLAVIX Take 1 tablet (75 mg total) by mouth daily.   donepezil 5 MG tablet Commonly known as:  ARICEPT Take 5 mg by mouth daily.   glipiZIDE 5 MG 24 hr tablet Commonly known as:  GLUCOTROL XL Take 1 tablet by mouth daily.   meloxicam 7.5 MG tablet Commonly known as:  MOBIC Take 1 tablet by mouth daily.   metFORMIN 500 MG tablet Commonly known as:  GLUCOPHAGE Take 2 tablets by mouth 2 (two) times daily.   metoprolol succinate 100 MG 24 hr tablet Commonly known as:  TOPROL-XL Take 1 tablet (100 mg total) by mouth daily. Take with or  immediately following a meal. Start taking on:  10/19/2016 What changed:  medication strength  how much to take  additional instructions   MULTI-VITAMINS Tabs Take 1 tablet by mouth daily.   pravastatin 40 MG tablet Commonly known as:  PRAVACHOL Take 1 tablet (40 mg total) by mouth daily.   triamcinolone ointment 0.1 % Commonly known as:  KENALOG Apply 1 application topically 2 (two) times daily.   vitamin B-12 1000 MCG tablet Commonly known as:  CYANOCOBALAMIN Take 1,000 mcg by mouth daily.       LABORATORY STUDIES CBC    Component Value Date/Time   WBC 8.6 10/15/2016 0920   RBC 4.83 10/15/2016 0920   HGB 15.0 10/15/2016 0928   HGB 13.1 07/17/2012 1434   HCT 44.0 10/15/2016 0928   HCT 37.9 (L) 07/17/2012 1434   PLT 200 10/15/2016 0920   PLT 230 07/17/2012 1434   MCV 86.5 10/15/2016 0920   MCV 90 07/17/2012 1434   MCH 29.2 10/15/2016 0920   MCHC 33.7 10/15/2016 0920   RDW 13.4 10/15/2016 0920   RDW 13.7 07/17/2012 1434   LYMPHSABS 1.7 10/15/2016 0920   MONOABS 0.5 10/15/2016 0920   EOSABS 0.1 10/15/2016 0920   BASOSABS 0.0 10/15/2016  0920   CMP    Component Value Date/Time   NA 137 10/15/2016 0928   NA 137 07/17/2012 1434   K 4.2 10/15/2016 0928   K 3.8 07/17/2012 1434   CL 102 10/15/2016 0928   CL 103 07/17/2012 1434   CO2 21 (L) 10/15/2016 0920   CO2 27 07/17/2012 1434   GLUCOSE 261 (H) 10/15/2016 0928   GLUCOSE 186 (H) 07/17/2012 1434   BUN 27 (H) 10/15/2016 0928   BUN 26 (H) 07/17/2012 1434   CREATININE 1.00 10/15/2016 0928   CREATININE 1.26 07/17/2012 1434   CALCIUM 9.3 10/15/2016 0920   CALCIUM 9.5 07/17/2012 1434   PROT 7.7 10/15/2016 0920   PROT 7.8 07/17/2012 1434   ALBUMIN 4.2 10/15/2016 0920   ALBUMIN 3.8 07/17/2012 1434   AST 27 10/15/2016 0920   AST 24 07/17/2012 1434   ALT 21 10/15/2016 0920   ALT 23 07/17/2012 1434   ALKPHOS 71 10/15/2016 0920   ALKPHOS 84 07/17/2012 1434   BILITOT 0.7 10/15/2016 0920   BILITOT 0.3  07/17/2012 1434   GFRNONAA 57 (L) 10/15/2016 0920   GFRNONAA 52 (L) 07/17/2012 1434   GFRAA >60 10/15/2016 0920   GFRAA >60 07/17/2012 1434   COAGS Lab Results  Component Value Date   INR 1.06 10/15/2016   INR 0.91 03/07/2016   INR 1.03 07/03/2015   Lipid Panel    Component Value Date/Time   CHOL 182 10/16/2016 0206   TRIG 118 10/16/2016 0206   HDL 42 10/16/2016 0206   CHOLHDL 4.3 10/16/2016 0206   VLDL 24 10/16/2016 0206   LDLCALC 116 (H) 10/16/2016 0206   HgbA1C  Lab Results  Component Value Date   HGBA1C 8.7 (H) 10/16/2016   Urinalysis    Component Value Date/Time   COLORURINE YELLOW (A) 03/07/2016 2022   APPEARANCEUR CLEAR (A) 03/07/2016 2022   APPEARANCEUR Hazy 07/17/2012 1418   LABSPEC 1.020 03/07/2016 2022   LABSPEC 1.016 07/17/2012 1418   PHURINE 6.0 03/07/2016 2022   GLUCOSEU >500 (A) 03/07/2016 2022   GLUCOSEU 150 mg/dL 16/04/9603 5409   HGBUR NEGATIVE 03/07/2016 2022   BILIRUBINUR NEGATIVE 03/07/2016 2022   BILIRUBINUR Negative 07/17/2012 1418   KETONESUR NEGATIVE 03/07/2016 2022   PROTEINUR 100 (A) 03/07/2016 2022   NITRITE NEGATIVE 03/07/2016 2022   LEUKOCYTESUR NEGATIVE 03/07/2016 2022   LEUKOCYTESUR Negative 07/17/2012 1418   Urine Drug Screen     Component Value Date/Time   LABOPIA NONE DETECTED 10/15/2016 2345   COCAINSCRNUR NONE DETECTED 10/15/2016 2345   LABBENZ NONE DETECTED 10/15/2016 2345   AMPHETMU NONE DETECTED 10/15/2016 2345   THCU NONE DETECTED 10/15/2016 2345   LABBARB NONE DETECTED 10/15/2016 2345    Alcohol Level No results found for: ETH   SIGNIFICANT DIAGNOSTIC STUDIES I have personally reviewed the radiological images below and agree with the radiology interpretations.  Ct Head Code Stroke Wo Contrast` 10/15/2016 1. Negative for acute intracranial abnormality  2. ASPECTS is 10  3. Atrophy and chronic microvascular ischemia.    Ct Angio Head W Or Wo Contrast Ct Angio Neck W Or Wo Contrast 10/15/2016 Mild  atherosclerotic disease in the carotid bifurcation bilaterally. No significant carotid or vertebral artery stenosis in the neck. Atherosclerotic calcification in the cavernous carotid bilaterally without significant stenosis. Atherosclerotic irregularity and stenosis involving the left A1, right M1, and bilateral posterior cerebral artery segments. No intracranial large vessel occlusion.    Mr Brain Wo Contrast 10/16/2016 1. Acute small vessel infarct in the left  pons.  2. Chronic small vessel ischemia with white matter gliosis and remote lacunar infarcts.  3. Remote micro hemorrhages in a hypertensive pattern.  4. Left sphenoid sinusitis.    Transthoracic Echocardiogram  10/18/2016   Study Conclusions - Left ventricle: The cavity size was normal. Wall thickness was   normal. Systolic function was normal. The estimated ejection   fraction was in the range of 60% to 65%. Wall motion was normal;   there were no regional wall motion abnormalities. Doppler   parameters are consistent with abnormal left ventricular   relaxation (grade 1 diastolic dysfunction).    HISTORY OF PRESENT ILLNESS Christopher [name] is an 83 y.o. male with a PMH significant for HTN, DM, and CVA who presented to the hospital via [facility] as stroke code. Pt and family resported that pt started having difficulty speaking with sluured speech and difficulty getting his words out started an hour prior to calling 911 and was getting worse, pt was last seen normal by family at 7 am this morning, pt also reported L weakness, denied visual changes, chest pain or SOB In ED, initial CTH showed no acute changes. Dr Lavonna Monarch evaluated the pt for IVTPA and determined that he was a candidate with no contraindication. IVTPA was discussed with pt and his son and consent was obtained from both, CTA showed no LVO so no intervention was needed.  IVTPA started at 9.49 AM, pt was admitted afterwards to ICU for stroke care.  Date last known  well: 10/15/2016 Time last known well: 7 am  tPA Given: yes started at 949 am   HOSPITAL COURSE Mr. Christopher [name] is an 83 y.o. male with history of HTN, DM, with previous TIAs and strokes presenting with difficulty speaking, getting words out and left-sided weakness. He received IV t-PA  10/15/2016 at 0952.   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 Aspects 10. Atrophy. Small vessel disease.   CTA head and neck  No LVO. Atherosclerosis. L A1, R M1 and B PCA stenosis.   MRI Left pontine infarct. Old lacunes. Remote microhemorrhages, hypertensive pattern.   2D Echo - 60% to 65%. No cardiac source of emboli identified.  LDL 116  HgbA1c 8.7  SCDs for VTE prophylaxis  Diet heart healthy/carb modified Room service appropriate? Yes; Fluid consistency: Thin  aspirin 81 mg daily and clopidogrel 75 mg daily prior to admission, post tPA now on aspirin 325 mg daily and clopidogrel 75 mg daily. Continue dual antiplatelet therapy until he follows with Dr. Sherryll Burger in [place]. Dr Sherryll Burger can decide if he agrees with this therapy.  Patient counseled to be compliant with his antithrombotic medications  Ongoing aggressive stroke risk factor management  Therapy recommendations:  HH PT, HH SLP  Disposition:  Anticipate return home  Followed by Dr. Sherryll Burger in [place] prior to admission, continue to follow up with him at discharge.  Hypertension  Per post TPA guidelines 24 hours    Mild hypotension. Medications adjusted.  Long-term BP goal normotensive - To be evaluated in F/U with PCP  Hyperlipidemia  Home meds:  Pravachol 40, resumed in hospital  LDL 116, goal < 70  Continue statin at discharge  Diabetes  HgbA1c 8.7, goal < 7.0  Glucose is elevated in the 200s  SSI  CBG monitoring  DM education  PCP close follow up  Other Stroke Risk  Factors  Advanced age  Former cigarette smoker  Hx stroke/TIA  06/2015 L thalamic/L PLIC infarct in setting of hypertensive emergency, R sided weakness  02/2016 - ataxia, falling - MRI, CUS, TTE negative - on ASA 325  DISCHARGE EXAM Blood pressure 137/78, pulse 94, temperature 98.4 F (36.9 C), temperature source Oral, resp. rate 18, height  (1.575 m), weight 68.6 kg (151 lb 3.8 oz), SpO2 99 %.   General - Well nourished, well developed, in no apparent distress.  Ophthalmologic - Fundi not visualized due to eye movement.  Cardiovascular - Regular rate and rhythm.  Mental Status -  Level of arousal and orientation to time, place, and person were intact. Language including expression, naming, repetition, comprehension was assessed and found intact. Mild dysarthria vs. accent. Fund of Knowledge was assessed and was intact.  Cranial Nerves II - XII - II - Visual field intact OU. III, IV, VI - Extraocular movements intact. V - Facial sensation intact bilaterally. VII - right nasolabial fold flattening. VIII - Hearing & vestibular intact bilaterally. X - Palate elevates symmetrically. XI - Chin turning & shoulder shrug intact bilaterally. XII - Tongue protrusion intact.  Motor Strength - The patient's strength was normal in all extremities and pronator drift was absent.  Bulk was normal and fasciculations were absent.   Motor Tone - Muscle tone was assessed at the neck and appendages and was normal.  Reflexes - The patient's reflexes were 1+ in all extremities and he had no pathological reflexes.  Sensory - Light touch, temperature/pinprick were assessed and were symmetrical.    Coordination - The patient had normal movements in the hands and feet with no ataxia or dysmetria.  Tremor was absent.  Gait and Station - deferred.   Discharge Diet   Diet heart healthy/carb modified Room service appropriate? Yes; Fluid consistency: Thin liquids   DISCHARGE  PLAN  Disposition:  Discharged to home.  aspirin 325 mg daily and clopidogrel 75 mg daily for secondary stroke prevention.  Ongoing risk factor control by Primary Care Physician at time of discharge  Follow-up Dr Sherryll Burger Neurologist in 2 weeks to address hypertension, diabetes, and anti platelet therapy.  40 minutes were spent preparing discharge.  Marvel Plan, MD PhD Stroke Neurology 10/18/2016 10:08 PM

## 2016-10-18 NOTE — Progress Notes (Signed)
Pt d/c to home by car with family. Assessment stable. All questions answered. 

## 2016-10-18 NOTE — Progress Notes (Signed)
  Echocardiogram 2D Echocardiogram has been performed.  Christopher [name] 10/18/2016, 8:59 AM

## 2016-10-30 ENCOUNTER — Other Ambulatory Visit: Payer: Self-pay | Admitting: Family Medicine

## 2016-10-30 DIAGNOSIS — L988 Other specified disorders of the skin and subcutaneous tissue: Secondary | ICD-10-CM

## 2016-10-31 ENCOUNTER — Other Ambulatory Visit: Payer: Self-pay | Admitting: Family Medicine

## 2016-10-31 DIAGNOSIS — L989 Disorder of the skin and subcutaneous tissue, unspecified: Secondary | ICD-10-CM

## 2016-11-05 ENCOUNTER — Ambulatory Visit
Admission: RE | Admit: 2016-11-05 | Discharge: 2016-11-05 | Disposition: A | Discharge status: Home / Self Care | Payer: Medicare Other | Source: Ambulatory Visit | Attending: Family Medicine | Admitting: Family Medicine

## 2016-11-05 ENCOUNTER — Other Ambulatory Visit: Payer: Self-pay | Admitting: Family Medicine

## 2016-11-05 DIAGNOSIS — L988 Other specified disorders of the skin and subcutaneous tissue: Secondary | ICD-10-CM | POA: Insufficient documentation

## 2016-11-05 DIAGNOSIS — R2231 Localized swelling, mass and lump, right upper limb: Secondary | ICD-10-CM | POA: Insufficient documentation

## 2016-11-05 DIAGNOSIS — L989 Disorder of the skin and subcutaneous tissue, unspecified: Secondary | ICD-10-CM

## 2016-11-05 DIAGNOSIS — R2232 Localized swelling, mass and lump, left upper limb: Secondary | ICD-10-CM

## 2016-11-10 ENCOUNTER — Ambulatory Visit
Admission: RE | Admit: 2016-11-10 | Discharge: 2016-11-10 | Disposition: A | Discharge status: Home / Self Care | Payer: Medicare Other | Source: Ambulatory Visit | Attending: Family Medicine | Admitting: Family Medicine

## 2016-11-10 DIAGNOSIS — L989 Disorder of the skin and subcutaneous tissue, unspecified: Secondary | ICD-10-CM

## 2016-11-10 DIAGNOSIS — R222 Localized swelling, mass and lump, trunk: Secondary | ICD-10-CM | POA: Diagnosis not present

## 2016-11-10 DIAGNOSIS — L988 Other specified disorders of the skin and subcutaneous tissue: Secondary | ICD-10-CM | POA: Diagnosis present

## 2016-12-29 ENCOUNTER — Ambulatory Visit: Payer: Medicare Other | Admitting: Physical Therapy

## 2016-12-30 ENCOUNTER — Observation Stay
Admission: EM | Admit: 2016-12-30 | Discharge: 2017-01-01 | Disposition: A | Discharge status: Home / Self Care | Payer: Medicare Other | Attending: Internal Medicine | Admitting: Internal Medicine

## 2016-12-30 ENCOUNTER — Emergency Department: Payer: Medicare Other

## 2016-12-30 DIAGNOSIS — Z880 Allergy status to penicillin: Secondary | ICD-10-CM | POA: Insufficient documentation

## 2016-12-30 DIAGNOSIS — I10 Essential (primary) hypertension: Secondary | ICD-10-CM

## 2016-12-30 DIAGNOSIS — Z87891 Personal history of nicotine dependence: Secondary | ICD-10-CM | POA: Diagnosis not present

## 2016-12-30 DIAGNOSIS — Z79899 Other long term (current) drug therapy: Secondary | ICD-10-CM | POA: Diagnosis not present

## 2016-12-30 DIAGNOSIS — E871 Hypo-osmolality and hyponatremia: Secondary | ICD-10-CM | POA: Diagnosis not present

## 2016-12-30 DIAGNOSIS — Z7984 Long term (current) use of oral hypoglycemic drugs: Secondary | ICD-10-CM | POA: Diagnosis not present

## 2016-12-30 DIAGNOSIS — E785 Hyperlipidemia, unspecified: Secondary | ICD-10-CM | POA: Insufficient documentation

## 2016-12-30 DIAGNOSIS — Z8249 Family history of ischemic heart disease and other diseases of the circulatory system: Secondary | ICD-10-CM | POA: Insufficient documentation

## 2016-12-30 DIAGNOSIS — R270 Ataxia, unspecified: Secondary | ICD-10-CM

## 2016-12-30 DIAGNOSIS — Z7902 Long term (current) use of antithrombotics/antiplatelets: Secondary | ICD-10-CM | POA: Diagnosis not present

## 2016-12-30 DIAGNOSIS — Z8673 Personal history of transient ischemic attack (TIA), and cerebral infarction without residual deficits: Secondary | ICD-10-CM | POA: Diagnosis not present

## 2016-12-30 DIAGNOSIS — I160 Hypertensive urgency: Principal | ICD-10-CM

## 2016-12-30 DIAGNOSIS — R531 Weakness: Secondary | ICD-10-CM

## 2016-12-30 DIAGNOSIS — E119 Type 2 diabetes mellitus without complications: Secondary | ICD-10-CM | POA: Diagnosis not present

## 2016-12-30 DIAGNOSIS — Z7982 Long term (current) use of aspirin: Secondary | ICD-10-CM | POA: Insufficient documentation

## 2016-12-30 DIAGNOSIS — Z23 Encounter for immunization: Secondary | ICD-10-CM | POA: Diagnosis not present

## 2016-12-30 LAB — CBC
HCT: 38.3 % — ABNORMAL LOW (ref 40.0–52.0)
Hemoglobin: 13.4 g/dL (ref 13.0–18.0)
MCH: 29.7 pg (ref 26.0–34.0)
MCHC: 34.9 g/dL (ref 32.0–36.0)
MCV: 85.3 fL (ref 80.0–100.0)
PLATELETS: 202 10*3/uL (ref 150–440)
RBC: 4.49 MIL/uL (ref 4.40–5.90)
RDW: 13.6 % (ref 11.5–14.5)
WBC: 8.6 10*3/uL (ref 3.8–10.6)

## 2016-12-30 LAB — BASIC METABOLIC PANEL
ANION GAP: 10 (ref 5–15)
BUN: 20 mg/dL (ref 6–20)
CALCIUM: 9.2 mg/dL (ref 8.9–10.3)
CO2: 25 mmol/L (ref 22–32)
Chloride: 97 mmol/L — ABNORMAL LOW (ref 101–111)
Creatinine, Ser: 0.93 mg/dL (ref 0.61–1.24)
GFR calc Af Amer: >60 mL/min (ref >60)
GLUCOSE: 189 mg/dL — AB (ref 65–99)
POTASSIUM: 4.2 mmol/L (ref 3.5–5.1)
SODIUM: 132 mmol/L — AB (ref 135–145)

## 2016-12-30 LAB — TROPONIN I

## 2016-12-30 NOTE — ED Notes (Signed)
Pt returned to ED Rm 25 from MRI at this time.

## 2016-12-30 NOTE — ED Provider Notes (Addendum)
[facility] Emergency Department Provider Note  Time seen: 10:44 PM  I have reviewed the triage vital signs and the nursing notes.   HISTORY  Chief Complaint Hypertension    HPI Christopher [name] is a 83 y.o. male with a past medical history of arthritis, ataxia, diabetes, hypertension, CVA, presents to the emergency department with his son for concerns of hypertension as well as left-sided weakness. According to the son the patient saw Dr. Sherryll BurgerShah 12/29/16 for acute worsening of his left-sided weaknesssince 12/27/16. States he's had increased left-sided weakness along with slurred speech and difficulty ambulating. Patient has a history of a prior left pontine infarct. Neurology was going to set up outpatient imaging and follow-up for the patient. However today the patient was noted to be quite hypertensive 215 systolic at home, so the son brought him to the emergency department. Son states the patient continues to have increased left-sided weakness. The patient denies any increased left-sided weakness, he states if anything he feels like his right leg is more weak than normal. Denies any headache. Patient does have a history of vascular dementia as well per her son.  Past Medical History:  Diagnosis Date  . Arthritis   . Ataxia   . Diabetes mellitus without complication (HCC)    controlled  . Dyslipidemia   . Hypertension    somewhat controlled  . Stroke (HCC)   . TIA (transient ischemic attack)     Patient Active Problem List   Diagnosis Date Noted  . Stroke (HCC) 10/15/2016  . TIA (transient ischemic attack) 03/07/2016  . Acute CVA (cerebrovascular accident) (HCC) 07/03/2015    History reviewed. No pertinent surgical history.  Prior to Admission medications   Medication Sig Start Date End Date Taking? Authorizing Provider  aspirin EC 325 MG EC tablet Take 1 tablet (325 mg total) by mouth daily. 10/19/16   Rinehuls, Kinnie Scalesavid L, PA-C  clopidogrel (PLAVIX) 75 MG  tablet Take 1 tablet (75 mg total) by mouth daily. 07/05/15   Katha HammingKonidena, Snehalatha, MD  donepezil (ARICEPT) 5 MG tablet Take 5 mg by mouth daily. 08/13/16   [provider]  glipiZIDE (GLUCOTROL XL) 5 MG 24 hr tablet Take 1 tablet by mouth daily. 06/21/15   [provider]  meloxicam (MOBIC) 7.5 MG tablet Take 1 tablet by mouth daily. 06/21/15   [provider]  metFORMIN (GLUCOPHAGE) 500 MG tablet Take 2 tablets by mouth 2 (two) times daily. 04/23/15   [provider]  metoprolol succinate (TOPROL-XL) 100 MG 24 hr tablet Take 1 tablet (100 mg total) by mouth daily. Take with or immediately following a meal. 10/19/16   Rinehuls, Kinnie Scalesavid L, PA-C  Multiple Vitamin (MULTI-VITAMINS) TABS Take 1 tablet by mouth daily.    [provider]  pravastatin (PRAVACHOL) 40 MG tablet Take 1 tablet (40 mg total) by mouth daily. 07/05/15   Katha HammingKonidena, Snehalatha, MD  triamcinolone ointment (KENALOG) 0.1 % Apply 1 application topically 2 (two) times daily.    [provider]  vitamin B-12 (CYANOCOBALAMIN) 1000 MCG tablet Take 1,000 mcg by mouth daily.    [provider]    Allergies  Allergen Reactions  . Hydrochlorothiazide     Unknown reaction  . Penicillins Other (See Comments)    Reaction unknown.     No family history on file.  Social History Social History  Substance Use Topics  . Smoking status: Former Games developermoker  . Smokeless tobacco: Former NeurosurgeonUser  . Alcohol use No  Review of Systems Constitutional: Negative for fever. Cardiovascular: Negative for chest pain. Respiratory: Negative for shortness of breath. Gastrointestinal: Negative for abdominal pain Musculoskeletal: Negative for back pain. Neurological: Negative for Headache. Increased left-sided weakness per son. Patient states increased right lower extremity weakness. All other ROS negative  ____________________________________________   PHYSICAL EXAM:  VITAL SIGNS: ED Triage  Vitals  Enc Vitals Group     BP 12/30/16 2206 (!) 213/111     Pulse Rate 12/30/16 2206 72     Resp 12/30/16 2206 18     Temp 12/30/16 2206 98.2 F (36.8 C)     Temp Source 12/30/16 2206 Oral     SpO2 12/30/16 2206 97 %     Weight 12/30/16 2208 158 lb 11.2 oz (72 kg)     Height 12/30/16 2208 5\' 5"  (1.651 m)     Head Circumference --      Peak Flow --      Pain Score 12/30/16 2206 0     Pain Loc --      Pain Edu? --      Excl. in GC? --     Constitutional: Alert, no distress. Well appearing lying in bed, answering questions and following commands. Eyes: Abnormal pupils, chronic. ENT   Head: Normocephalic and atraumatic.   Mouth/Throat: Mucous membranes are moist. Cardiovascular: Normal rate, regular rhythm Respiratory: Normal respiratory effort without tachypnea nor retractions. Breath sounds are clear Gastrointestinal: Soft and nontender. No distention.   Musculoskeletal: Nontender with normal range of motion in all extremities. Neurologic:  Normal speech and language. Equal grip strength bilaterally. No pronator drift identified. 5/5 motor in all extremities with no lower extremity drift identified. No obvious facial droop or cranial nerve deficit. Skin:  Skin is warm, dry and intact.  Psychiatric: Mood and affect are normal. Speech and behavior are normal.   ____________________________________________    EKG  EKG reviewed and interpreted by myself shows normal sinus rhythm at 73 bpm, narrow QRS, left axis deviation, normal intervals, no concerning ST changes.  ____________________________________________    RADIOLOGY  MRI pending  ____________________________________________   INITIAL IMPRESSION / ASSESSMENT AND PLAN / ED COURSE  Pertinent labs & imaging results that were available during my care of the patient were reviewed by me and considered in my medical decision making (see chart for details).  Patient presents to the emergency department with 2  complaints, his initial complaint is his blood pressure being elevated to 215 systolic at home. Patient was seen by neurology yesterday and his metoprolol was decreased from 100 mg daily to 75 mg daily. The son states he checked the patient's blood pressure this afternoon and it was elevated greater than 200 so he dosed an additional 50 mg of metoprolol in addition to the 75 mg of metoprolol from this morning. Patient states he waited 30 minutes and rechecked the blood pressure and it went up so he came to the emergency department for evaluation. He also states over the past 3 days patient has had increased left-sided weakness and slurred speech. Patient has a history of vascular dementia, sees neurology including yesterday and had an outpatient MRI ordered. Given the patient's increased left-sided deficits per report we'll obtain the MRI in the emergency department. Patient's blood pressure currently 194 systolic. We will continue to closely monitor but will hold off on medicating at this time. Patient denies any headache, chest pain. We will check labs. If the MRI is unchanged patient will likely be discharged home with  outpatient follow-up. I discussed with the son that it is more dangerous at this point to lower the patient's blood pressure acutely in the emergency department as opposed to slowly letting it come down over the next several days at home. Patient is agreeable and son is agreeable to this plan.  Patient's lab work is largely within normal limits. Troponin negative. MRI is pending. Patient care signed out to Dr. Dolores Frame.  ____________________________________________   FINAL CLINICAL IMPRESSION(S) / ED DIAGNOSES  Hypertension Left-sided weakness    Minna Antis, MD 12/30/16 2251    Minna Antis, MD 12/30/16 [phone]

## 2016-12-30 NOTE — ED Notes (Signed)
Patient transported to MRI by Jann, EDT. 

## 2016-12-30 NOTE — ED Triage Notes (Signed)
Pt presents to ED via [facility] from home with c/o hypertension. Pt denies any c/o pain, no reports of CP, N/V/D, or SHOB. EMS reported pt's VS as: BP 212/128, HR 70s, O2 sats 98% RA, CBG 198. Family reports compliance with prescribed HTN medications, but states dosage was recently changed.

## 2016-12-31 ENCOUNTER — Encounter: Payer: Self-pay | Admitting: Internal Medicine

## 2016-12-31 DIAGNOSIS — I160 Hypertensive urgency: Secondary | ICD-10-CM | POA: Diagnosis present

## 2016-12-31 DIAGNOSIS — R531 Weakness: Secondary | ICD-10-CM

## 2016-12-31 LAB — BASIC METABOLIC PANEL
ANION GAP: 9 (ref 5–15)
BUN: 15 mg/dL (ref 6–20)
CALCIUM: 9.1 mg/dL (ref 8.9–10.3)
CO2: 27 mmol/L (ref 22–32)
Chloride: 97 mmol/L — ABNORMAL LOW (ref 101–111)
Creatinine, Ser: 0.72 mg/dL (ref 0.61–1.24)
Glucose, Bld: 174 mg/dL — ABNORMAL HIGH (ref 65–99)
Potassium: 3.8 mmol/L (ref 3.5–5.1)
Sodium: 133 mmol/L — ABNORMAL LOW (ref 135–145)

## 2016-12-31 LAB — CBC
HCT: 39.8 % — ABNORMAL LOW (ref 40.0–52.0)
HEMOGLOBIN: 13.6 g/dL (ref 13.0–18.0)
MCH: 29.4 pg (ref 26.0–34.0)
MCHC: 34.1 g/dL (ref 32.0–36.0)
MCV: 86.2 fL (ref 80.0–100.0)
Platelets: 208 10*3/uL (ref 150–440)
RBC: 4.61 MIL/uL (ref 4.40–5.90)
RDW: 14 % (ref 11.5–14.5)
WBC: 7.3 10*3/uL (ref 3.8–10.6)

## 2016-12-31 LAB — GLUCOSE, CAPILLARY
GLUCOSE-CAPILLARY: 132 mg/dL — AB (ref 65–99)
GLUCOSE-CAPILLARY: 154 mg/dL — AB (ref 65–99)
GLUCOSE-CAPILLARY: 97 mg/dL (ref 65–99)
Glucose-Capillary: 73 mg/dL (ref 65–99)

## 2016-12-31 MED ORDER — SENNOSIDES-DOCUSATE SODIUM 8.6-50 MG PO TABS: 1.0000 | ORAL_TABLET | Freq: Every evening | ORAL | Status: DC | PRN

## 2016-12-31 MED ORDER — CLOPIDOGREL BISULFATE 75 MG PO TABS
75.0000 mg | ORAL_TABLET | Freq: Every day | ORAL | Status: DC
  Administered 2016-12-31 – 2017-01-01 (×2): 75 mg via ORAL
  Filled 2016-12-31 (×2): qty 1

## 2016-12-31 MED ORDER — SODIUM CHLORIDE 0.9% FLUSH: 3.0000 mL | INTRAVENOUS | Status: DC | PRN

## 2016-12-31 MED ORDER — ASPIRIN EC 325 MG PO TBEC
325.0000 mg | DELAYED_RELEASE_TABLET | Freq: Every day | ORAL | Status: DC
  Administered 2016-12-31 – 2017-01-01 (×2): 325 mg via ORAL
  Filled 2016-12-31 (×4): qty 1

## 2016-12-31 MED ORDER — SODIUM CHLORIDE 0.9 % IV SOLN: 250.0000 mL | INTRAVENOUS | Status: DC | PRN

## 2016-12-31 MED ORDER — ADULT MULTIVITAMIN W/MINERALS CH
1.0000 | ORAL_TABLET | Freq: Every day | ORAL | Status: DC
  Administered 2016-12-31 – 2017-01-01 (×2): 1 via ORAL
  Filled 2016-12-31 (×2): qty 1

## 2016-12-31 MED ORDER — METFORMIN HCL 500 MG PO TABS
1000.0000 mg | ORAL_TABLET | Freq: Two times a day (BID) | ORAL | Status: DC
  Administered 2016-12-31 – 2017-01-01 (×3): 1000 mg via ORAL
  Filled 2016-12-31 (×3): qty 2

## 2016-12-31 MED ORDER — VITAMIN B-12 1000 MCG PO TABS
1000.0000 ug | ORAL_TABLET | Freq: Every day | ORAL | Status: DC
  Administered 2016-12-31 – 2017-01-01 (×2): 1000 ug via ORAL
  Filled 2016-12-31 (×2): qty 1

## 2016-12-31 MED ORDER — HYDRALAZINE HCL 20 MG/ML IJ SOLN
5.0000 mg | Freq: Once | INTRAMUSCULAR | Status: AC
  Administered 2016-12-31: 5 mg via INTRAVENOUS
  Filled 2016-12-31: qty 1

## 2016-12-31 MED ORDER — PRAVASTATIN SODIUM 40 MG PO TABS
40.0000 mg | ORAL_TABLET | Freq: Every day | ORAL | Status: DC
  Administered 2016-12-31 – 2017-01-01 (×2): 40 mg via ORAL
  Filled 2016-12-31 (×2): qty 1

## 2016-12-31 MED ORDER — PNEUMOCOCCAL VAC POLYVALENT 25 MCG/0.5ML IJ INJ
0.5000 mL | INJECTION | INTRAMUSCULAR | Status: AC
  Administered 2017-01-01: 0.5 mL via INTRAMUSCULAR
  Filled 2016-12-31: qty 0.5

## 2016-12-31 MED ORDER — SODIUM CHLORIDE 0.9% FLUSH
3.0000 mL | Freq: Two times a day (BID) | INTRAVENOUS | Status: DC
  Administered 2016-12-31 – 2017-01-01 (×4): 3 mL via INTRAVENOUS

## 2016-12-31 MED ORDER — GLIPIZIDE ER 5 MG PO TB24
5.0000 mg | ORAL_TABLET | Freq: Every day | ORAL | Status: DC
  Administered 2016-12-31 – 2017-01-01 (×2): 5 mg via ORAL
  Filled 2016-12-31 (×2): qty 1

## 2016-12-31 MED ORDER — ONDANSETRON HCL 4 MG/2ML IJ SOLN: 4.0000 mg | Freq: Four times a day (QID) | INTRAMUSCULAR | Status: DC | PRN

## 2016-12-31 MED ORDER — ACETAMINOPHEN 650 MG RE SUPP: 650.0000 mg | Freq: Four times a day (QID) | RECTAL | Status: DC | PRN

## 2016-12-31 MED ORDER — ONDANSETRON HCL 4 MG PO TABS: 4.0000 mg | ORAL_TABLET | Freq: Four times a day (QID) | ORAL | Status: DC | PRN

## 2016-12-31 MED ORDER — METOPROLOL SUCCINATE ER 50 MG PO TB24
100.0000 mg | ORAL_TABLET | Freq: Every day | ORAL | Status: DC
Start: 2016-12-31 — End: 2017-01-01
  Administered 2016-12-31 – 2017-01-01 (×2): 100 mg via ORAL
  Filled 2016-12-31 (×2): qty 2

## 2016-12-31 MED ORDER — AMLODIPINE BESYLATE 5 MG PO TABS
5.0000 mg | ORAL_TABLET | Freq: Every day | ORAL | Status: DC
  Administered 2016-12-31: 5 mg via ORAL
  Filled 2016-12-31: qty 1

## 2016-12-31 MED ORDER — ENOXAPARIN SODIUM 40 MG/0.4ML ~~LOC~~ SOLN
40.0000 mg | SUBCUTANEOUS | Status: DC
  Administered 2016-12-31: 40 mg via SUBCUTANEOUS
  Filled 2016-12-31: qty 0.4

## 2016-12-31 MED ORDER — ACETAMINOPHEN 325 MG PO TABS: 650.0000 mg | ORAL_TABLET | Freq: Four times a day (QID) | ORAL | Status: DC | PRN

## 2016-12-31 MED ORDER — HYDRALAZINE HCL 20 MG/ML IJ SOLN
10.0000 mg | INTRAMUSCULAR | Status: DC | PRN
  Filled 2016-12-31: qty 1

## 2016-12-31 MED ORDER — DONEPEZIL HCL 5 MG PO TABS
5.0000 mg | ORAL_TABLET | Freq: Every day | ORAL | Status: DC
  Administered 2016-12-31 – 2017-01-01 (×2): 5 mg via ORAL
  Filled 2016-12-31 (×2): qty 1

## 2016-12-31 NOTE — Evaluation (Signed)
Physical Therapy Evaluation Patient Details Name: Christopher [name] MRN: [id number] DOB: 10-09-1925 Today's Date: 12/31/2016   History of Present Illness  Pt is a 83 y/o M who presented with elevated blood pressure and c/o LE weakness.  Pt's PMH includes TIA, stroke, ataxia.    Clinical Impression  Pt admitted with above diagnosis. Pt currently with functional limitations due to the deficits listed below (see PT Problem List). Session limited due to hypertension. BP supine 182/88, standing at bedside 181/115, seated at end of session 179/85.  He demonstrates weakness LLE>RLE, unclear if this is his baseline from prior stroke.  He currently requires min guard for safety with transfers.  Pt reports he has 24/7 assist/supervision from his family and caregiver at home. Anticipate that once pt is more medically appropriate for exertional activity, he will progress well with mobility. Pt will benefit from skilled PT to increase their independence and safety with mobility to allow discharge to the venue listed below.      Follow Up Recommendations Home health PT;Supervision/Assistance - 24 hour    Equipment Recommendations  None recommended by PT (Pt reports he has a RW at home)    Recommendations for Other Services       Precautions / Restrictions Precautions Precautions: Fall;Other (comment) Precaution Comments: Monitor BP Restrictions Weight Bearing Restrictions: No      Mobility  Bed Mobility Overal bed mobility: Needs Assistance Bed Mobility: Supine to Sit     Supine to sit: Min guard;HOB elevated     General bed mobility comments: Pt requires increased time and effot with use of bed rail to pull up into sitting position.  Transfers Overall transfer level: Needs assistance Equipment used: Rolling walker (2 wheeled) Transfers: Sit to/from [employer] Sit to Stand: Min guard Stand pivot transfers: Min guard       General transfer comment: Min guard as pt  demonstrates mild instability with sit<>stand.  Pt denies dizziness.   Ambulation/Gait             General Gait Details: Unable to assess at this time due to elevated BP (see general comments below)   Stairs            Wheelchair Mobility    Modified Rankin (Stroke Patients Only)       Balance Overall balance assessment: Needs assistance Sitting-balance support: No upper extremity supported;Feet supported Sitting balance-Leahy Scale: Good     Standing balance support: Single extremity supported;During functional activity Standing balance-Leahy Scale: Poor Standing balance comment: Relies on UE support for static and dynamic activities                             Pertinent Vitals/Pain Pain Assessment: No/denies pain (Pt denies and no signs of pain)    Home Living Family/patient expects to be discharged to:: Private residence Living Arrangements: Children Available Help at Discharge: Family;Personal care attendant;Available 24 hours/day Type of Home: House Home Access: Ramped entrance     Home Layout: Able to live on main level with bedroom/bathroom;Two level Home Equipment: Walker - 2 wheels Additional Comments: Pt is a poor historian, usnure of reliability of information provided regarding home layout and PLOF.  He reports that he is never alone at home.  He has a personal care attendant and lives with his daughter and son.    Prior Function Level of Independence: Needs assistance   Gait / Transfers Assistance Needed: Ambulates with RW per pt and  chart review.  Pt denies any recent falls.  ADL's / Homemaking Assistance Needed: Pt reports that his caregiver assists him to the bathroom and assists with bathing his back.          Hand Dominance        Extremity/Trunk Assessment   Upper Extremity Assessment Upper Extremity Assessment: Overall WFL for tasks assessed    Lower Extremity Assessment Lower Extremity Assessment: LLE  deficits/detail;RLE deficits/detail RLE Deficits / Details: Strength grossly 4/5  LLE Deficits / Details: Knee flexion 3/5, knee extension 3-/5.  Otherwise strength grossly 4/5.  Noted increased muscular tension in L calf with palpation, not painful or tender with squeeze to L calf, not warm or red. LLE Sensation:  (WNL with dermatomal testing)    Cervical / Trunk Assessment Cervical / Trunk Assessment: Kyphotic  Communication   Communication: HOH (Language barrier? Pt does well when PT talking slowly)  Cognition Arousal/Alertness: Awake/alert Behavior During Therapy: WFL for tasks assessed/performed Overall Cognitive Status: No family/caregiver present to determine baseline cognitive functioning                                        General Comments General comments (skin integrity, edema, etc.): BP supine 182/88, standing at bedside 181/115, seated at end of session 179/85    Exercises General Exercises - Lower Extremity Straight Leg Raises: AROM;Both;10 reps;Supine   Assessment/Plan    PT Assessment Patient needs continued PT services  PT Problem List Decreased strength;Decreased balance;Decreased safety awareness;Decreased knowledge of use of DME       PT Treatment Interventions DME instruction;Gait training;Functional mobility training;Therapeutic activities;Therapeutic exercise;Balance training;Patient/family education;Neuromuscular re-education    PT Goals (Current goals can be found in the Care Plan section)  Acute Rehab PT Goals Patient Stated Goal: to go home PT Goal Formulation: With patient Time For Goal Achievement: 01/14/17 Potential to Achieve Goals: Good    Frequency Min 2X/week   Barriers to discharge        Co-evaluation               AM-PAC PT "6 Clicks" Daily Activity  Outcome Measure Difficulty turning over in bed (including adjusting bedclothes, sheets and blankets)?: A Little Difficulty moving from lying on back to  sitting on the side of the bed? : Total Difficulty sitting down on and standing up from a chair with arms (e.g., wheelchair, bedside commode, etc,.)?: A Little Help needed moving to and from a bed to chair (including a wheelchair)?: A Little Help needed walking in hospital room?: A Little Help needed climbing 3-5 steps with a railing? : A Little 6 Click Score: 16    End of Session Equipment Utilized During Treatment: Gait belt Activity Tolerance: Treatment limited secondary to medical complications (Comment) (limited by hypertension) Patient left: in chair;with call bell/phone within reach;with chair alarm set Nurse Communication: Mobility status;Other (comment) (BP readings) PT Visit Diagnosis: Unsteadiness on feet (R26.81);Muscle weakness (generalized) (M62.81)    Time: [id number] PT Time Calculation (min) (ACUTE ONLY): 30 min   Charges:   PT Evaluation $PT Eval Low Complexity: 1 Procedure PT Treatments $Therapeutic Activity: 8-22 mins   PT G Codes:   PT G-Codes **NOT FOR INPATIENT CLASS** Functional Assessment Tool Used: Clinical judgement;AM-PAC 6 Clicks Basic Mobility Functional Limitation: Mobility: Walking and moving around Mobility: Walking and Moving Around Current Status ([id number]): At least 40 percent but less than 60 percent impaired,  limited or restricted Mobility: Walking and Moving Around Goal Status [phone]): At least 1 percent but less than 20 percent impaired, limited or restricted    Encarnacion Chu PT, DPT 12/31/2016, 1:11 PM

## 2016-12-31 NOTE — Consult Note (Signed)
Reason for Consult:Abnormal MRI Referring Physician: Renae Gloss  CC: Leg weakness  HPI: Christopher [name] is an 83 y.o. male Christopher [name]  is a 83 y.o. male with a known history of CVA, hyperlipidemia, hypertension, diabetes mellitus, ataxia, arthritis presented to the emergency room with elevated blood pressure. Patient's son checked blood pressure for the patient at home was elevated and his systolic blood pressure was more than 220 mmHg. He was brought to the emergency room patient was given IV hydralazine and blood pressure was controlled.  Recently he saw his neurologist Dr. Sherryll Burger and at that time complained of left sided weakness that had worsened as of 6/23.  MRI was scheduled.  Today patient reports right leg weakness.  Earlier today was complaining of LLE problems.  Patient on ASA, statin and Plavix.    Past Medical History:  Diagnosis Date  . Arthritis   . Ataxia   . Diabetes mellitus without complication (HCC)    controlled  . Dyslipidemia   . Hypertension    somewhat controlled  . Stroke (HCC)   . TIA (transient ischemic attack)     Past Surgical History:  Procedure Laterality Date  . none      Family History  Problem Relation Age of Onset  . CAD Neg Hx   . Hypertension Neg Hx     Social History:  reports that he has quit smoking. He has quit using smokeless tobacco. He reports that he does not drink alcohol or use drugs.  Allergies  Allergen Reactions  . Hydrochlorothiazide     Unknown reaction  . Penicillins Other (See Comments)    Reaction unknown.     Medications:  I have reviewed the patient's current medications. Prior to Admission:  Prescriptions Prior to Admission  Medication Sig Dispense Refill Last Dose  . aspirin EC 325 MG EC tablet Take 1 tablet (325 mg total) by mouth daily. 30 tablet 0 12/30/2016 at Unknown time  . clopidogrel (PLAVIX) 75 MG tablet Take 1 tablet (75 mg total) by mouth daily. 30 tablet 0 12/30/2016 at Unknown time  . donepezil (ARICEPT)  5 MG tablet Take 5 mg by mouth daily.   12/30/2016 at Unknown time  . metFORMIN (GLUCOPHAGE) 500 MG tablet Take 2 tablets by mouth 2 (two) times daily.   12/30/2016 at Unknown time  . metoprolol succinate (TOPROL-XL) 25 MG 24 hr tablet Take 75 mg by mouth daily.   12/30/2016 at Unknown time  . Multiple Vitamin (MULTI-VITAMINS) TABS Take 1 tablet by mouth daily.   12/30/2016 at Unknown time  . pravastatin (PRAVACHOL) 40 MG tablet Take 1 tablet (40 mg total) by mouth daily. 30 tablet 0 12/30/2016 at Unknown time  . triamcinolone ointment (KENALOG) 0.1 % Apply 1 application topically 2 (two) times daily.   12/30/2016 at Unknown time  . glipiZIDE (GLUCOTROL XL) 5 MG 24 hr tablet Take 1 tablet by mouth daily.   Not Taking at Unknown time  . meloxicam (MOBIC) 7.5 MG tablet Take 1 tablet by mouth daily.   Not Taking at Unknown time  . metoprolol succinate (TOPROL-XL) 100 MG 24 hr tablet Take 1 tablet (100 mg total) by mouth daily. Take with or immediately following a meal. (Patient not taking: Reported on 12/31/2016) 30 tablet 5 Not Taking at Unknown time  . vitamin B-12 (CYANOCOBALAMIN) 1000 MCG tablet Take 1,000 mcg by mouth daily.   Not Taking at Unknown time   Scheduled: . amLODipine  5 mg Oral Daily  . aspirin  325 mg Oral Daily  . clopidogrel  75 mg Oral Daily  . donepezil  5 mg Oral Daily  . enoxaparin (LOVENOX) injection  40 mg Subcutaneous Q24H  . glipiZIDE  5 mg Oral Daily  . metFORMIN  1,000 mg Oral BID  . metoprolol succinate  100 mg Oral Daily  . multivitamin with minerals  1 tablet Oral Daily  . [START ON 01/01/2017] pneumococcal 23 valent vaccine  0.5 mL Intramuscular Tomorrow-1000  . pravastatin  40 mg Oral Daily  . sodium chloride flush  3 mL Intravenous Q12H  . vitamin B-12  1,000 mcg Oral Daily    ROS: History obtained from the patient  General ROS: negative for - chills, fatigue, fever, night sweats, weight gain or weight loss Psychological ROS: negative for - behavioral disorder,  hallucinations, memory difficulties, mood swings or suicidal ideation Ophthalmic ROS: negative for - blurry vision, double vision, eye pain or loss of vision ENT ROS: negative for - epistaxis, nasal discharge, oral lesions, sore throat, tinnitus or vertigo Allergy and Immunology ROS: negative for - hives or itchy/watery eyes Hematological and Lymphatic ROS: negative for - bleeding problems, bruising or swollen lymph nodes Endocrine ROS: negative for - galactorrhea, hair pattern changes, polydipsia/polyuria or temperature intolerance Respiratory ROS: negative for - cough, hemoptysis, shortness of breath or wheezing Cardiovascular ROS: negative for - chest pain, dyspnea on exertion, edema or irregular heartbeat Gastrointestinal ROS: negative for - abdominal pain, diarrhea, hematemesis, nausea/vomiting or stool incontinence Genito-Urinary ROS: negative for - dysuria, hematuria, incontinence or urinary frequency/urgency Musculoskeletal ROS: negative for - joint swelling or muscular weakness Neurological ROS: as noted in HPI Dermatological ROS: negative for rash and skin lesion changes  Physical Examination: Blood pressure (!) 160/86, pulse 68, temperature 98 F (36.7 C), temperature source Oral, resp. rate 18, height 5\' 5"  (1.651 m), weight 64.5 kg (142 lb 3.2 oz), SpO2 97 %.  HEENT-  Normocephalic, no lesions, without obvious abnormality.  Normal external eye and conjunctiva.  Normal TM's bilaterally.  Normal auditory canals and external ears. Normal external nose, mucus membranes and septum.  Normal pharynx. Cardiovascular- S1, S2 normal, pulses palpable throughout   Lungs- chest clear, no wheezing, rales, normal symmetric air entry Abdomen- soft, non-tender; bowel sounds normal; no masses,  no organomegaly Extremities- no edema Lymph-no adenopathy palpable Musculoskeletal-no joint tenderness, deformity or swelling Skin-warm and dry, no hyperpigmentation, vitiligo, or suspicious  lesions  Neurological Examination   Mental Status: Alert, poor memory.  Speech dysarthric.  Able to follow 3 step commands but requires reinforcement. Cranial Nerves: II: Discs flat bilaterally; Visual fields grossly normal, pupils equal, round, reactive to light and accommodation III,IV, VI: ptosis not present, extra-ocular motions intact bilaterally V,VII: right facial droop, facial light touch sensation normal bilaterally VIII: hearing normal bilaterally IX,X: gag reflex present XI: bilateral shoulder shrug XII: midline tongue extension Motor: Right : Upper extremity   5/5    Left:     Upper extremity   5/5  Lower extremity   5/5     Lower extremity   5/5 Tone and bulk:normal tone throughout; no atrophy noted Sensory: Pinprick and light touch intact throughout, bilaterally Deep Tendon Reflexes: 2+ in the upper extremities and absent in the lower extremities Plantars: Right: downgoing   Left: equivocal Cerebellar: Normal finger-to-nose and normal heel-to-shin testing bilaterally Gait: not tested due to safety concerns    Laboratory Studies:   Basic Metabolic Panel:  Recent Labs Lab 12/30/16 2250 12/31/16 0455  NA 132* 133*  K 4.2 3.8  CL 97* 97*  CO2 25 27  GLUCOSE 189* 174*  BUN 20 15  CREATININE 0.93 0.72  CALCIUM 9.2 9.1    Liver Function Tests: No results for input(s): AST, ALT, ALKPHOS, BILITOT, PROT, ALBUMIN in the last 168 hours. No results for input(s): LIPASE, AMYLASE in the last 168 hours. No results for input(s): AMMONIA in the last 168 hours.  CBC:  Recent Labs Lab 12/30/16 2250 12/31/16 0455  WBC 8.6 7.3  HGB 13.4 13.6  HCT 38.3* 39.8*  MCV 85.3 86.2  PLT 202 208    Cardiac Enzymes:  Recent Labs Lab 12/30/16 2250  TROPONINI <0.03    BNP: Invalid input(s): POCBNP  CBG:  Recent Labs Lab 12/31/16 0732 12/31/16 1138  GLUCAP 132* 154*    Microbiology: Results for orders placed or performed during the hospital encounter of  10/15/16  MRSA PCR Screening     Status: None   Collection Time: 10/15/16 12:32 PM  Result Value Ref Range Status   MRSA by PCR NEGATIVE NEGATIVE Final    Comment:        The GeneXpert MRSA Assay (FDA approved for NASAL specimens only), is one component of a comprehensive MRSA colonization surveillance program. It is not intended to diagnose MRSA infection nor to guide or monitor treatment for MRSA infections.     Coagulation Studies: No results for input(s): LABPROT, INR in the last 72 hours.  Urinalysis: No results for input(s): COLORURINE, LABSPEC, PHURINE, GLUCOSEU, HGBUR, BILIRUBINUR, KETONESUR, PROTEINUR, UROBILINOGEN, NITRITE, LEUKOCYTESUR in the last 168 hours.  Invalid input(s): APPERANCEUR  Lipid Panel:     Component Value Date/Time   CHOL 182 10/16/2016 0206   TRIG 118 10/16/2016 0206   HDL 42 10/16/2016 0206   CHOLHDL 4.3 10/16/2016 0206   VLDL 24 10/16/2016 0206   LDLCALC 116 (H) 10/16/2016 0206    HgbA1C:  Lab Results  Component Value Date   HGBA1C 8.7 (H) 10/16/2016    Urine Drug Screen:     Component Value Date/Time   LABOPIA NONE DETECTED 10/15/2016 2345   COCAINSCRNUR NONE DETECTED 10/15/2016 2345   LABBENZ NONE DETECTED 10/15/2016 2345   AMPHETMU NONE DETECTED 10/15/2016 2345   THCU NONE DETECTED 10/15/2016 2345   LABBARB NONE DETECTED 10/15/2016 2345    Alcohol Level: No results for input(s): ETH in the last 168 hours.   Imaging: Mr Brain Wo Contrast  Result Date: 12/31/2016 CLINICAL DATA:  83 y/o M; increasing left-sided weakness, slurred speech, and difficulty with ambulation. EXAM: MRI HEAD WITHOUT CONTRAST TECHNIQUE: Multiplanar, multiecho pulse sequences of the brain and surrounding structures were obtained without intravenous contrast. COMPARISON:  10/16/2016 MRI of the head FINDINGS: Brain: No acute infarction, acute hemorrhage, hydrocephalus, extra-axial collection or mass lesion. Small chronic infarcts are present in right frontal  periventricular white matter, bilateral thalamus, bilateral lentiform nucleus, and left paramedian pons. Nonspecific foci of T2 FLAIR hyperintense signal abnormality in subcortical and periventricular white matter are consistent with moderate chronic microvascular ischemic changes and there is mild brain parenchymal volume loss. New punctate focus of susceptibility hypointensity within the left brachium pontis compatible with interval hemosiderin deposition from microhemorrhage. There are several additional scattered foci of punctate susceptibility hypointensity in the right frontal lobe, bilateral basal ganglia, and the pons consistent with chronic microhemorrhage. Vascular: Normal flow voids. Skull and upper cervical spine: Normal marrow signal. Sinuses/Orbits: Large left maxillary sinus mucous retention cyst and mild diffuse paranasal sinus mucosal thickening. No significant abnormal signal of  mastoid air cells. Bilateral intra-ocular lens replacement. Other: None. IMPRESSION: 1. No acute intracranial abnormality identified. 2. Small chronic infarcts in basal ganglia, left paramedian pons, and right frontal subcortical white matter. 3. Stable moderate chronic microvascular ischemic changes and mild parenchymal volume loss of the brain. 4. Single interval punctate focus of microhemorrhage and left brachium pontis. Additional scattered stable chronic foci of microhemorrhage with central predominance, likely related to chronic hypertension. Electronically Signed   By: Mitzi Hansen M.D.   On: 12/31/2016 00:14     Assessment/Plan: 83 year old male with multiple medical problems including CVA on ASA and Plavix presenting with poorly controlled blood pressure.  Also has complaints of left and right leg weakness intermittently.  MRI of the brain reviewed and shows no acute changes but does show evidence of multiple areas of microhemorrhages, one new since previous evaluation.    Recommendations: 1.   Continue ASA and Plavix 2.  Blood sugar and BP control 3.  Aggressive lipid management with target LDL<70 4.  EEG.  May be performed on an outpatient basis 5.  Patient to continue follow up with outpatient neurologist   Thana Farr, MD Neurology [phone] 12/31/2016, 3:14 PM

## 2016-12-31 NOTE — H&P (Signed)
[facility] [place] [facility]   PATIENT NAME: Christopher [name]    MR#:  [id number]  DATE OF BIRTH:  Apr 05, 1926  DATE OF ADMISSION:  12/30/2016  PRIMARY CARE PHYSICIAN: Patient, No Pcp Per   REQUESTING/REFERRING PHYSICIAN:   CHIEF COMPLAINT:   Chief Complaint  Patient presents with  . Hypertension    HISTORY OF PRESENT ILLNESS: Christopher [name]  is a 83 y.o. male with a known history of CVA, hyperlipidemia, hypertension, diabetes mellitus, ataxia, arthritis presented to the emergency room with elevated blood pressure. Patient's son checked blood pressure for the patient at home was elevated and his systolic blood pressure was more than 2 20 mmHg. He was brought to the emergency room patient was given IV hydralazine and blood pressure was controlled. Recently he saw his neurologist Dr. Sherryll Burger who has decreased the dose of metoprolol from 100 to 75 mg daily. After this the patient blood pressure again went up according to patient's son. Patient has some weakness in the right leg but overall his strength is the same as in the past. Speech was okay in the emergency room. No difficulty in swallowing food. Ambulates with the help of a walker at home and receiving physical therapy at home. Hospitalist service was consulted for elevated blood pressure. Agent was worked up with MRI brain in the emergency room which showed chronic infarcts and no acute intracranial abnormality.  PAST MEDICAL HISTORY:   Past Medical History:  Diagnosis Date  . Arthritis   . Ataxia   . Diabetes mellitus without complication (HCC)    controlled  . Dyslipidemia   . Hypertension    somewhat controlled  . Stroke (HCC)   . TIA (transient ischemic attack)     PAST SURGICAL HISTORY: Past Surgical History:  Procedure Laterality Date  . none      SOCIAL HISTORY:  Social History  Substance Use Topics  . Smoking status: Former Games developer  . Smokeless tobacco: Former Neurosurgeon  . Alcohol use No     FAMILY HISTORY:  Family History  Problem Relation Age of Onset  . CAD Neg Hx   . Hypertension Neg Hx     DRUG ALLERGIES:  Allergies  Allergen Reactions  . Hydrochlorothiazide     Unknown reaction  . Penicillins Other (See Comments)    Reaction unknown.     REVIEW OF SYSTEMS:   CONSTITUTIONAL: No fever, fatigue or weakness.  EYES: No blurred or double vision.  EARS, NOSE, AND THROAT: No tinnitus or ear pain.  RESPIRATORY: No cough, shortness of breath, wheezing or hemoptysis.  CARDIOVASCULAR: No chest pain, orthopnea, edema.  GASTROINTESTINAL: No nausea, vomiting, diarrhea or abdominal pain.  GENITOURINARY: No dysuria, hematuria.  ENDOCRINE: No polyuria, nocturia,  HEMATOLOGY: No anemia, easy bruising or bleeding SKIN: No rash or lesion. MUSCULOSKELETAL: No joint pain or arthritis.   NEUROLOGIC: No tingling, numbness,  Weakness right leg PSYCHIATRY: No anxiety or depression.   MEDICATIONS AT HOME:  Prior to Admission medications   Medication Sig Start Date End Date Taking? Authorizing Provider  aspirin EC 325 MG EC tablet Take 1 tablet (325 mg total) by mouth daily. 10/19/16  Yes Rinehuls, Kinnie Scales, PA-C  clopidogrel (PLAVIX) 75 MG tablet Take 1 tablet (75 mg total) by mouth daily. 07/05/15  Yes Katha Hamming, MD  donepezil (ARICEPT) 5 MG tablet Take 5 mg by mouth daily. 08/13/16  Yes [provider]  metFORMIN (GLUCOPHAGE) 500 MG tablet Take 2 tablets by mouth 2 (two)  times daily. 04/23/15  Yes [provider]  metoprolol succinate (TOPROL-XL) 25 MG 24 hr tablet Take 75 mg by mouth daily.   Yes [provider]  Multiple Vitamin (MULTI-VITAMINS) TABS Take 1 tablet by mouth daily.   Yes [provider]  pravastatin (PRAVACHOL) 40 MG tablet Take 1 tablet (40 mg total) by mouth daily. 07/05/15  Yes Katha Hamming, MD  triamcinolone ointment (KENALOG) 0.1 % Apply 1 application topically 2 (two) times daily.   Yes [provider]  glipiZIDE (GLUCOTROL XL) 5 MG 24 hr tablet Take 1 tablet by mouth daily. 06/21/15   [provider]  meloxicam (MOBIC) 7.5 MG tablet Take 1 tablet by mouth daily. 06/21/15   [provider]  metoprolol succinate (TOPROL-XL) 100 MG 24 hr tablet Take 1 tablet (100 mg total) by mouth daily. Take with or immediately following a meal. Patient not taking: Reported on 12/31/2016 10/19/16   Rinehuls, Kinnie Scales, PA-C  vitamin B-12 (CYANOCOBALAMIN) 1000 MCG tablet Take 1,000 mcg by mouth daily.    [provider]      PHYSICAL EXAMINATION:   VITAL SIGNS: Blood pressure (!) 164/85, pulse 65, temperature 98.2 F (36.8 C), temperature source Oral, resp. rate 18, height 5\' 5"  (1.651 m), weight 72 kg (158 lb 11.2 oz), SpO2 97 %.  GENERAL:  83 y.o.-year-old patient lying in the bed with no acute distress.  EYES: Pupils equal, round, reactive to light and accommodation. No scleral icterus. Extraocular muscles intact.  HEENT: Head atraumatic, normocephalic. Oropharynx and nasopharynx clear.  NECK:  Supple, no jugular venous distention. No thyroid enlargement, no tenderness.  LUNGS: Normal breath sounds bilaterally, no wheezing, rales,rhonchi or crepitation. No use of accessory muscles of respiration.  CARDIOVASCULAR: S1, S2 normal. No murmurs, rubs, or gallops.  ABDOMEN: Soft, nontender, nondistended. Bowel sounds present. No organomegaly or mass.  EXTREMITIES: No pedal edema, cyanosis, or clubbing.  NEUROLOGIC: Cranial nerves II through XII are intact. Muscle strength 5/5 in all extremities. Sensation intact. Gait not checked.  PSYCHIATRIC: The patient is alert and oriented x 3.  SKIN: No obvious rash, lesion, or ulcer.   LABORATORY PANEL:   CBC  Recent Labs Lab 12/30/16 2250  WBC 8.6  HGB 13.4  HCT 38.3*  PLT 202  MCV 85.3  MCH 29.7  MCHC 34.9  RDW 13.6    ------------------------------------------------------------------------------------------------------------------  Chemistries   Recent Labs Lab 12/30/16 2250  NA 132*  K 4.2  CL 97*  CO2 25  GLUCOSE 189*  BUN 20  CREATININE 0.93  CALCIUM 9.2   ------------------------------------------------------------------------------------------------------------------ estimated creatinine clearance is 47.8 mL/min (by C-G formula based on SCr of 0.93 mg/dL). ------------------------------------------------------------------------------------------------------------------ No results for input(s): TSH, T4TOTAL, T3FREE, THYROIDAB in the last 72 hours.  Invalid input(s): FREET3   Coagulation profile No results for input(s): INR, PROTIME in the last 168 hours. ------------------------------------------------------------------------------------------------------------------- No results for input(s): DDIMER in the last 72 hours. -------------------------------------------------------------------------------------------------------------------  Cardiac Enzymes  Recent Labs Lab 12/30/16 2250  TROPONINI <0.03   ------------------------------------------------------------------------------------------------------------------ Invalid input(s): POCBNP  ---------------------------------------------------------------------------------------------------------------  Urinalysis    Component Value Date/Time   COLORURINE YELLOW (A) 03/07/2016 2022   APPEARANCEUR CLEAR (A) 03/07/2016 2022   APPEARANCEUR Hazy 07/17/2012 1418   LABSPEC 1.020 03/07/2016 2022   LABSPEC 1.016 07/17/2012 1418   PHURINE 6.0 03/07/2016 2022   GLUCOSEU >500 (A) 03/07/2016 2022   GLUCOSEU 150 mg/dL 40/98/1191 4782   HGBUR NEGATIVE 03/07/2016 2022   BILIRUBINUR NEGATIVE 03/07/2016 2022   BILIRUBINUR Negative 07/17/2012 1418  KETONESUR NEGATIVE 03/07/2016 2022   PROTEINUR 100 (A) 03/07/2016 2022   NITRITE  NEGATIVE 03/07/2016 2022   LEUKOCYTESUR NEGATIVE 03/07/2016 2022   LEUKOCYTESUR Negative 07/17/2012 1418     RADIOLOGY: Mr Brain Wo Contrast  Result Date: 12/31/2016 CLINICAL DATA:  83 y/o M; increasing left-sided weakness, slurred speech, and difficulty with ambulation. EXAM: MRI HEAD WITHOUT CONTRAST TECHNIQUE: Multiplanar, multiecho pulse sequences of the brain and surrounding structures were obtained without intravenous contrast. COMPARISON:  10/16/2016 MRI of the head FINDINGS: Brain: No acute infarction, acute hemorrhage, hydrocephalus, extra-axial collection or mass lesion. Small chronic infarcts are present in right frontal periventricular white matter, bilateral thalamus, bilateral lentiform nucleus, and left paramedian pons. Nonspecific foci of T2 FLAIR hyperintense signal abnormality in subcortical and periventricular white matter are consistent with moderate chronic microvascular ischemic changes and there is mild brain parenchymal volume loss. New punctate focus of susceptibility hypointensity within the left brachium pontis compatible with interval hemosiderin deposition from microhemorrhage. There are several additional scattered foci of punctate susceptibility hypointensity in the right frontal lobe, bilateral basal ganglia, and the pons consistent with chronic microhemorrhage. Vascular: Normal flow voids. Skull and upper cervical spine: Normal marrow signal. Sinuses/Orbits: Large left maxillary sinus mucous retention cyst and mild diffuse paranasal sinus mucosal thickening. No significant abnormal signal of mastoid air cells. Bilateral intra-ocular lens replacement. Other: None. IMPRESSION: 1. No acute intracranial abnormality identified. 2. Small chronic infarcts in basal ganglia, left paramedian pons, and right frontal subcortical white matter. 3. Stable moderate chronic microvascular ischemic changes and mild parenchymal volume loss of the brain. 4. Single interval punctate focus of  microhemorrhage and left brachium pontis. Additional scattered stable chronic foci of microhemorrhage with central predominance, likely related to chronic hypertension. Electronically Signed   By: Mitzi HansenLance  Furusawa-Stratton M.D.   On: 12/31/2016 00:14    EKG: Orders placed or performed during the hospital encounter of 10/15/16  . ED EKG  . ED EKG  . EKG 12-Lead  . EKG 12-Lead    IMPRESSION AND PLAN: 83 year old male patient with history of CVA, ataxia, arthritis, hypertension presented to the emergency room with increased blood pressure. Admitting diagnosis 1. Hypertensive urgency 2. Hyponatremia 3. Type 2 diabetes mellitus 4. Hyperlipidemia 5. History of CVA Treatment plan Admit patient to medical floor observation bed Control blood pressure with metoprolol 100 MG orally daily along with when necessary hydralazine IV Medical management for diabetes mellitus Physical therapy evaluation Neuro check every 4 hourly for 24 hours Follow-up sodium level Resume aspirin and Plavix Resume statin medication All the records are reviewed and case discussed with ED provider. Management plans discussed with the patient, family and they are in agreement.  CODE STATUS:FULL CODE Surrogate decision-maker patient's son.    Code Status Orders        Start     Ordered   12/31/16 0255  Full code  Continuous     12/31/16 0254    Code Status History    Date Active Date Inactive Code Status Order ID Comments User Context   10/15/2016  9:56 AM 10/18/2016  6:29 PM Full Code [id number]  Jonna Munroakakni, Tarek, MD ED   03/07/2016 11:45 PM 03/08/2016  6:13 AM Full Code [id number]  Hugelmeyer, Alexis, DO Inpatient   07/03/2015  4:17 PM 07/05/2015  3:35 PM Full Code [id number]  Katha HammingKonidena, Snehalatha, MD ED       TOTAL TIME TAKING CARE OF THIS PATIENT: 50 minutes.    Ihor AustinPavan Pyreddy M.D on 12/31/2016 at 3:08 AM  Between 7am to 6pm - Pager - [phone]  After 6pm go to [email or web address] - password [email or web address]  [facility]  Office  [phone]  CC: Primary care physician; Patient, No Pcp Per

## 2016-12-31 NOTE — Plan of Care (Signed)
Problem: Education: Goal: Knowledge of [facility] Education information/materials will improve Outcome: Completed/Met Date Met: 12/31/16 Pt admitted for hypertensive urgency, BP now trending down. Tele box 3, verified by Sabra Heck, RN. Skin intact, verified by Sabra Heck, RN. Incontinent at times. High fall risk, non skid socks on, bed alarm on & fall contract signed.  Problem: Pain Managment: Goal: General experience of comfort will improve Outcome: Completed/Met Date Met: 12/31/16 No complaints of pain will continue to monitor.

## 2016-12-31 NOTE — ED Notes (Signed)
Pt allowed to have a meal tray, per Dr Dolores FrameSung.

## 2016-12-31 NOTE — ED Provider Notes (Signed)
-----------------------------------------   1:03 AM on 12/31/2016 -----------------------------------------  Updated patient and son of MRI results. Blood pressure was 150/60s, starting to increase now 168 over 70s. Son is very concerned regarding patient's labile blood pressure. Also concerned patient's increased side weakness. Will obtain orthostatics and ambulate patient to assess steadiness of gait.  ----------------------------------------- 1:49 AM on 12/31/2016 -----------------------------------------  Patient ambulated steadily with walker. Blood pressure continues to rise. Administer IV hydralazine and discuss with hospitalist to evaluate patient in the emergency department for admission.       Irean HongSung, Jade J, MD 12/31/16 [phone]

## 2016-12-31 NOTE — Progress Notes (Signed)
Patient ID: Christopher [name], male   DOB: [birth year], 83 y.o.   MRN: [id number]  [facility] PROGRESS NOTE  Christopher [name] WUJ:[id number] DOB: [birth year] DOA: 12/30/2016 PCP: Patient, No Pcp Per  HPI/Subjective: Patient came in for high blood pressure. Had some difficulty walking. He recently had a stroke a couple months ago. When he got up with physical therapy his blood pressure up.  Objective: Vitals:   12/31/16 1137 12/31/16 1540  BP: (!) 160/86 129/63  Pulse: 68 73  Resp: 18 18  Temp: 98 F (36.7 C)     Filed Weights   12/30/16 2208 12/31/16 0311  Weight: 72 kg (158 lb 11.2 oz) 64.5 kg (142 lb 3.2 oz)    ROS: Review of Systems  Constitutional: Negative for chills and fever.  Eyes: Negative for blurred vision.  Respiratory: Negative for cough and shortness of breath.   Cardiovascular: Negative for chest pain.  Gastrointestinal: Negative for abdominal pain, constipation, diarrhea, nausea and vomiting.  Genitourinary: Negative for dysuria.  Musculoskeletal: Negative for joint pain.  Neurological: Negative for dizziness and headaches.   Exam: Physical Exam  Constitutional: He is oriented to person, place, and time.  HENT:  Nose: No mucosal edema.  Mouth/Throat: No oropharyngeal exudate or posterior oropharyngeal edema.  Eyes: Conjunctivae, EOM and lids are normal. Pupils are equal, round, and reactive to light.  Neck: No JVD present. Carotid bruit is not present. No edema present. No thyroid mass and no thyromegaly present.  Cardiovascular: S1 normal and S2 normal.  Exam reveals no gallop.   No murmur heard. Pulses:      Dorsalis pedis pulses are 2+ on the right side, and 2+ on the left side.  Respiratory: No respiratory distress. He has no wheezes. He has no rhonchi. He has no rales.  GI: Soft. Bowel sounds are normal. There is no tenderness.  Musculoskeletal:       Right ankle: He exhibits no swelling.       Left ankle: He exhibits no swelling.  Lymphadenopathy:     He has no cervical adenopathy.  Neurological: He is alert and oriented to person, place, and time.  Occasional slurred speech. Power 5 out of 5 bilaterally  Skin: Skin is warm. No rash noted. Nails show no clubbing.  Psychiatric: He has a normal mood and affect.      Data Reviewed: Basic Metabolic Panel:  Recent Labs Lab 12/30/16 2250 12/31/16 0455  NA 132* 133*  K 4.2 3.8  CL 97* 97*  CO2 25 27  GLUCOSE 189* 174*  BUN 20 15  CREATININE 0.93 0.72  CALCIUM 9.2 9.1   CBC:  Recent Labs Lab 12/30/16 2250 12/31/16 0455  WBC 8.6 7.3  HGB 13.4 13.6  HCT 38.3* 39.8*  MCV 85.3 86.2  PLT 202 208   Cardiac Enzymes:  Recent Labs Lab 12/30/16 2250  TROPONINI <0.03   CBG:  Recent Labs Lab 12/31/16 0732 12/31/16 1138  GLUCAP 132* 154*     Studies: Mr Brain Wo Contrast  Result Date: 12/31/2016 CLINICAL DATA:  83 y/o M; increasing left-sided weakness, slurred speech, and difficulty with ambulation. EXAM: MRI HEAD WITHOUT CONTRAST TECHNIQUE: Multiplanar, multiecho pulse sequences of the brain and surrounding structures were obtained without intravenous contrast. COMPARISON:  10/16/2016 MRI of the head FINDINGS: Brain: No acute infarction, acute hemorrhage, hydrocephalus, extra-axial collection or mass lesion. Small chronic infarcts are present in right frontal periventricular white matter, bilateral thalamus, bilateral lentiform nucleus, and left paramedian pons. Nonspecific foci of T2  FLAIR hyperintense signal abnormality in subcortical and periventricular white matter are consistent with moderate chronic microvascular ischemic changes and there is mild brain parenchymal volume loss. New punctate focus of susceptibility hypointensity within the left brachium pontis compatible with interval hemosiderin deposition from microhemorrhage. There are several additional scattered foci of punctate susceptibility hypointensity in the right frontal lobe, bilateral basal ganglia, and the  pons consistent with chronic microhemorrhage. Vascular: Normal flow voids. Skull and upper cervical spine: Normal marrow signal. Sinuses/Orbits: Large left maxillary sinus mucous retention cyst and mild diffuse paranasal sinus mucosal thickening. No significant abnormal signal of mastoid air cells. Bilateral intra-ocular lens replacement. Other: None. IMPRESSION: 1. No acute intracranial abnormality identified. 2. Small chronic infarcts in basal ganglia, left paramedian pons, and right frontal subcortical white matter. 3. Stable moderate chronic microvascular ischemic changes and mild parenchymal volume loss of the brain. 4. Single interval punctate focus of microhemorrhage and left brachium pontis. Additional scattered stable chronic foci of microhemorrhage with central predominance, likely related to chronic hypertension. Electronically Signed   By: Mitzi HansenLance  Furusawa-Stratton M.D.   On: 12/31/2016 00:14    Scheduled Meds: . amLODipine  5 mg Oral Daily  . aspirin  325 mg Oral Daily  . clopidogrel  75 mg Oral Daily  . donepezil  5 mg Oral Daily  . enoxaparin (LOVENOX) injection  40 mg Subcutaneous Q24H  . glipiZIDE  5 mg Oral Daily  . metFORMIN  1,000 mg Oral BID  . metoprolol succinate  100 mg Oral Daily  . multivitamin with minerals  1 tablet Oral Daily  . [START ON 01/01/2017] pneumococcal 23 valent vaccine  0.5 mL Intramuscular Tomorrow-1000  . pravastatin  40 mg Oral Daily  . sodium chloride flush  3 mL Intravenous Q12H  . vitamin B-12  1,000 mcg Oral Daily   Continuous Infusions: . sodium chloride      Assessment/Plan:  1. Hypertensive urgency. Patient was only placed on metoprolol 100 mg daily. I added Norvasc 5 mg daily.  blood pressure trending a little bit better. Last blood pressure after Norvasc 129/63. 2. Recent stroke. MRI showing quite a few old strokes and also microhemorrhages. This is likely related to chronic hypertension.better control of blood pressure needed. Continue  aspirin and Plavix as per neurology. 3. Hyperlipidemia unspecified- pravastatin. Check lipid profile am 4. Type 2 Diabetes mellitus- Glipizide, glucophage 5. Unsteady gait- pt eval  Code Status:     Code Status Orders        Start     Ordered   12/31/16 0255  Full code  Continuous     12/31/16 0254    Code Status History    Date Active Date Inactive Code Status Order ID Comments User Context   10/15/2016  9:56 AM 10/18/2016  6:29 PM Full Code [id number]  Jonna Munroakakni, Tarek, MD ED   03/07/2016 11:45 PM 03/08/2016  6:13 AM Full Code [id number]  Tonye RoyaltyHugelmeyer, Alexis, DO Inpatient   07/03/2015  4:17 PM 07/05/2015  3:35 PM Full Code [id number]  Katha HammingKonidena, Snehalatha, MD ED     Family Communication: spoke with son Disposition Plan: home potentially next day or so  Consultants:  Neurology  Time spent: 35 minutes  Alford HighlandWIETING, Younis Mathey  [employer] [facility]

## 2017-01-01 ENCOUNTER — Ambulatory Visit: Payer: Medicare Other | Admitting: Physical Therapy

## 2017-01-01 DIAGNOSIS — I160 Hypertensive urgency: Secondary | ICD-10-CM | POA: Diagnosis not present

## 2017-01-01 LAB — GLUCOSE, CAPILLARY
GLUCOSE-CAPILLARY: 144 mg/dL — AB (ref 65–99)
GLUCOSE-CAPILLARY: 169 mg/dL — AB (ref 65–99)
GLUCOSE-CAPILLARY: 121 mg/dL — AB (ref 65–99)

## 2017-01-01 LAB — LIPID PANEL
CHOL/HDL RATIO: 4.2 ratio
Cholesterol: 151 mg/dL (ref 0–200)
HDL: 36 mg/dL — AB (ref >40)
LDL CALC: 73 mg/dL (ref 0–99)
Triglycerides: 212 mg/dL — ABNORMAL HIGH (ref <150)
VLDL: 42 mg/dL — AB (ref 0–40)

## 2017-01-01 MED ORDER — METOPROLOL SUCCINATE ER 100 MG PO TB24: 100.0000 mg | ORAL_TABLET | Freq: Every day | ORAL | 0 refills | Status: AC

## 2017-01-01 MED ORDER — ASPIRIN 81 MG PO CHEW: 81.0000 mg | CHEWABLE_TABLET | Freq: Every day | ORAL | 0 refills | Status: DC

## 2017-01-01 MED ORDER — AMLODIPINE BESYLATE 10 MG PO TABS: 10.0000 mg | ORAL_TABLET | Freq: Every day | ORAL | 0 refills | Status: DC

## 2017-01-01 MED ORDER — AMLODIPINE BESYLATE 10 MG PO TABS: 10.0000 mg | ORAL_TABLET | Freq: Every day | ORAL | 0 refills | Status: AC

## 2017-01-01 MED ORDER — METOPROLOL SUCCINATE ER 100 MG PO TB24: 100.0000 mg | ORAL_TABLET | Freq: Every day | ORAL | 0 refills | Status: DC

## 2017-01-01 MED ORDER — AMLODIPINE BESYLATE 10 MG PO TABS
10.0000 mg | ORAL_TABLET | Freq: Every day | ORAL | Status: DC
  Administered 2017-01-01: 10 mg via ORAL
  Filled 2017-01-01: qty 1

## 2017-01-01 NOTE — Care Management (Signed)
Patient to discharge home today.  Patient admitted Hypertensive urgency. Patient lives at home with family.  Family provides 24 hour care. PT has assessed patient and recommend home health PT. Patient has previously opened with [facility].  Patient's son at bedside for assessment, they have declined home health PT and state that they wish to pursue outpatient PT.  Home health RN and aide were also ordered.  These services were also declined.  Son states that he will be managing the patient's medication, as well as checking blood pressures.  RW to be delivered to room prior to discharge.  Barbara CowerJason with [facility] Notified.  RNCM signing off.

## 2017-01-01 NOTE — Progress Notes (Signed)
Patient discharged home as ordered,instructions explained to patient and son ,prescriptions handed to son,escorted by son and staff member via wheel chair

## 2017-01-01 NOTE — Care Management Obs Status (Signed)
MEDICARE OBSERVATION STATUS NOTIFICATION   Patient Details  Name: Christopher [name] MRN: [id number] Date of Birth: Jul 15, 1925   Medicare Observation Status Notification Given:  No (Message left with son.  Awaiting return call)    Chapman FitchBOWEN, Patton Swisher T, RN 01/01/2017, 2:19 PM

## 2017-01-01 NOTE — Care Management Obs Status (Signed)
MEDICARE OBSERVATION STATUS NOTIFICATION   Patient Details  Name: Christopher [name] MRN: [id number] Date of Birth: 26-Sep-1925   Medicare Observation Status Notification Given:  Yes    Chapman FitchBOWEN, Reshad Saab T, RN 01/01/2017, 3:55 PM

## 2017-01-01 NOTE — Discharge Summary (Signed)
[facility] - [place] [facility]   PATIENT NAME: Christopher [name]    MR#:  [id number]  DATE OF BIRTH:  [birth year]  DATE OF ADMISSION:  12/30/2016 ADMITTING PHYSICIAN: Ihor Austin, MD  DATE OF DISCHARGE: 01/01/2017  PRIMARY CARE PHYSICIAN: Patient, No Pcp Per    ADMISSION DIAGNOSIS:  Ataxia [R27.0] Left-sided weakness [R53.1] Hypertensive urgency [I16.0] Essential hypertension [I10]  DISCHARGE DIAGNOSIS:  Active Problems:   Hypertensive urgency   SECONDARY DIAGNOSIS:   Past Medical History:  Diagnosis Date  . Arthritis   . Ataxia   . Diabetes mellitus without complication (HCC)    controlled  . Dyslipidemia   . Hypertension    somewhat controlled  . Stroke (HCC)   . TIA (transient ischemic attack)     HOSPITAL COURSE:   83 year old male with a recent CVA, diabetes and hyperlipidemia who presented with elevated blood pressure.  1. Hypertensive urgency: Patient will continue metoprolol and Norvasc. Patient was advised to check blood pressure in the morning and evening.  2. Recent CVA: MRI shows old CVA and micro-hemorrhage due to chronic hypertension. Patient was evaluated by neurology. Patient will continue aspirin, Plavix and statin.  3. Hyperlipidemia: Continue statin  4. Diabetes: Patient will continue diabetic diet with oral medications including glipizide and metformin     DISCHARGE CONDITIONS AND DIET:   Stable for discharge on diabetic diet  CONSULTS OBTAINED:  Treatment Team:  Thana Farr, MD  DRUG ALLERGIES:   Allergies  Allergen Reactions  . Hydrochlorothiazide     Unknown reaction  . Penicillins Other (See Comments)    Reaction unknown.     DISCHARGE MEDICATIONS:   Current Discharge Medication List    START taking these medications   Details  amLODipine (NORVASC) 10 MG tablet Take 1 tablet (10 mg total) by mouth daily. Qty: 30 tablet, Refills: 0    aspirin (ASPIRIN CHILDRENS) 81 MG chewable tablet Chew 1  tablet (81 mg total) by mouth daily. Qty: 120 tablet, Refills: 0      CONTINUE these medications which have CHANGED   Details  metoprolol succinate (TOPROL-XL) 100 MG 24 hr tablet Take 1 tablet (100 mg total) by mouth daily. Take with or immediately following a meal. Qty: 30 tablet, Refills: 0      CONTINUE these medications which have NOT CHANGED   Details  clopidogrel (PLAVIX) 75 MG tablet Take 1 tablet (75 mg total) by mouth daily. Qty: 30 tablet, Refills: 0    donepezil (ARICEPT) 5 MG tablet Take 5 mg by mouth daily.    metFORMIN (GLUCOPHAGE) 500 MG tablet Take 2 tablets by mouth 2 (two) times daily.    Multiple Vitamin (MULTI-VITAMINS) TABS Take 1 tablet by mouth daily.    pravastatin (PRAVACHOL) 40 MG tablet Take 1 tablet (40 mg total) by mouth daily. Qty: 30 tablet, Refills: 0    triamcinolone ointment (KENALOG) 0.1 % Apply 1 application topically 2 (two) times daily.    glipiZIDE (GLUCOTROL XL) 5 MG 24 hr tablet Take 1 tablet by mouth daily.    vitamin B-12 (CYANOCOBALAMIN) 1000 MCG tablet Take 1,000 mcg by mouth daily.      STOP taking these medications     aspirin EC 325 MG EC tablet      meloxicam (MOBIC) 7.5 MG tablet           Today   CHIEF COMPLAINT:   No acute issues overnight   VITAL SIGNS:  Blood pressure (!) 152/99, pulse 88, temperature 97.9  F (36.6 C), temperature source Oral, resp. rate 18, height 5\' 5"  (1.651 m), weight 64.5 kg (142 lb 3.2 oz), SpO2 97 %.   REVIEW OF SYSTEMS:  Review of Systems  Constitutional: Negative.  Negative for chills, fever and malaise/fatigue.  HENT: Negative.  Negative for ear discharge, ear pain, hearing loss, nosebleeds and sore throat.   Eyes: Negative.  Negative for blurred vision and pain.  Respiratory: Negative.  Negative for cough, hemoptysis, shortness of breath and wheezing.   Cardiovascular: Negative.  Negative for chest pain, palpitations and leg swelling.  Gastrointestinal: Negative.  Negative  for abdominal pain, blood in stool, diarrhea, nausea and vomiting.  Genitourinary: Negative.  Negative for dysuria.  Musculoskeletal: Negative.  Negative for back pain.  Skin: Negative.   Neurological: Negative for dizziness, tremors, speech change, focal weakness, seizures and headaches.  Endo/Heme/Allergies: Negative.  Does not bruise/bleed easily.  Psychiatric/Behavioral: Negative.  Negative for depression, hallucinations and suicidal ideas.     PHYSICAL EXAMINATION:  GENERAL:  83 y.o.-year-old patient lying in the bed with no acute distress.  NECK:  Supple, no jugular venous distention. No thyroid enlargement, no tenderness.  LUNGS: Normal breath sounds bilaterally, no wheezing, rales,rhonchi  No use of accessory muscles of respiration.  CARDIOVASCULAR: S1, S2 normal. No murmurs, rubs, or gallops.  ABDOMEN: Soft, non-tender, non-distended. Bowel sounds present. No organomegaly or mass.  EXTREMITIES: No pedal edema, cyanosis, or clubbing.  PSYCHIATRIC: The patient is alert and oriented x 3.  SKIN: No obvious rash, lesion, or ulcer.  Right facial droop(OLD) strnegth 4/5 B/l LE  DATA REVIEW:   CBC  Recent Labs Lab 12/31/16 0455  WBC 7.3  HGB 13.6  HCT 39.8*  PLT 208    Chemistries   Recent Labs Lab 12/31/16 0455  NA 133*  K 3.8  CL 97*  CO2 27  GLUCOSE 174*  BUN 15  CREATININE 0.72  CALCIUM 9.1    Cardiac Enzymes  Recent Labs Lab 12/30/16 2250  TROPONINI <0.03    Microbiology Results  @[email or web address] @  RADIOLOGY:  Mr Brain Wo Contrast  Result Date: 12/31/2016 CLINICAL DATA:  83 y/o M; increasing left-sided weakness, slurred speech, and difficulty with ambulation. EXAM: MRI HEAD WITHOUT CONTRAST TECHNIQUE: Multiplanar, multiecho pulse sequences of the brain and surrounding structures were obtained without intravenous contrast. COMPARISON:  10/16/2016 MRI of the head FINDINGS: Brain: No acute infarction, acute hemorrhage, hydrocephalus, extra-axial  collection or mass lesion. Small chronic infarcts are present in right frontal periventricular white matter, bilateral thalamus, bilateral lentiform nucleus, and left paramedian pons. Nonspecific foci of T2 FLAIR hyperintense signal abnormality in subcortical and periventricular white matter are consistent with moderate chronic microvascular ischemic changes and there is mild brain parenchymal volume loss. New punctate focus of susceptibility hypointensity within the left brachium pontis compatible with interval hemosiderin deposition from microhemorrhage. There are several additional scattered foci of punctate susceptibility hypointensity in the right frontal lobe, bilateral basal ganglia, and the pons consistent with chronic microhemorrhage. Vascular: Normal flow voids. Skull and upper cervical spine: Normal marrow signal. Sinuses/Orbits: Large left maxillary sinus mucous retention cyst and mild diffuse paranasal sinus mucosal thickening. No significant abnormal signal of mastoid air cells. Bilateral intra-ocular lens replacement. Other: None. IMPRESSION: 1. No acute intracranial abnormality identified. 2. Small chronic infarcts in basal ganglia, left paramedian pons, and right frontal subcortical white matter. 3. Stable moderate chronic microvascular ischemic changes and mild parenchymal volume loss of the brain. 4. Single interval punctate focus of microhemorrhage and left brachium pontis.  Additional scattered stable chronic foci of microhemorrhage with central predominance, likely related to chronic hypertension. Electronically Signed   By: Mitzi Hansen M.D.   On: 12/31/2016 00:14      Current Discharge Medication List    START taking these medications   Details  amLODipine (NORVASC) 10 MG tablet Take 1 tablet (10 mg total) by mouth daily. Qty: 30 tablet, Refills: 0    aspirin (ASPIRIN CHILDRENS) 81 MG chewable tablet Chew 1 tablet (81 mg total) by mouth daily. Qty: 120 tablet, Refills:  0      CONTINUE these medications which have CHANGED   Details  metoprolol succinate (TOPROL-XL) 100 MG 24 hr tablet Take 1 tablet (100 mg total) by mouth daily. Take with or immediately following a meal. Qty: 30 tablet, Refills: 0      CONTINUE these medications which have NOT CHANGED   Details  clopidogrel (PLAVIX) 75 MG tablet Take 1 tablet (75 mg total) by mouth daily. Qty: 30 tablet, Refills: 0    donepezil (ARICEPT) 5 MG tablet Take 5 mg by mouth daily.    metFORMIN (GLUCOPHAGE) 500 MG tablet Take 2 tablets by mouth 2 (two) times daily.    Multiple Vitamin (MULTI-VITAMINS) TABS Take 1 tablet by mouth daily.    pravastatin (PRAVACHOL) 40 MG tablet Take 1 tablet (40 mg total) by mouth daily. Qty: 30 tablet, Refills: 0    triamcinolone ointment (KENALOG) 0.1 % Apply 1 application topically 2 (two) times daily.    glipiZIDE (GLUCOTROL XL) 5 MG 24 hr tablet Take 1 tablet by mouth daily.    vitamin B-12 (CYANOCOBALAMIN) 1000 MCG tablet Take 1,000 mcg by mouth daily.      STOP taking these medications     aspirin EC 325 MG EC tablet      meloxicam (MOBIC) 7.5 MG tablet            Management plans discussed with the patient and he is in agreement. Stable for discharge home  Patient should follow up with neurology  CODE STATUS:     Code Status Orders        Start     Ordered   12/31/16 0255  Full code  Continuous     12/31/16 0254    Code Status History    Date Active Date Inactive Code Status Order ID Comments User Context   10/15/2016  9:56 AM 10/18/2016  6:29 PM Full Code [id number]  Jonna Munro, MD ED   03/07/2016 11:45 PM 03/08/2016  6:13 AM Full Code [id number]  Hugelmeyer, Alexis, DO Inpatient   07/03/2015  4:17 PM 07/05/2015  3:35 PM Full Code [id number]  Katha Hamming, MD ED      TOTAL TIME TAKING CARE OF THIS PATIENT: 37 minutes.    Note: This dictation was prepared with Dragon dictation along with smaller phrase technology. Any  transcriptional errors that result from this process are unintentional.  Jalynn Waddell M.D on 01/01/2017 at 8:27 AM  Between 7am to 6pm - Pager - [phone] After 6pm go to [email or web address] - password [email or web address]  [facility]  Office  [phone]  CC: Primary care physician; Patient, No Pcp Per

## 2017-01-05 ENCOUNTER — Ambulatory Visit: Payer: Medicare Other | Admitting: Physical Therapy

## 2017-01-06 ENCOUNTER — Other Ambulatory Visit: Payer: Self-pay | Admitting: Neurology

## 2017-01-06 DIAGNOSIS — I69328 Other speech and language deficits following cerebral infarction: Secondary | ICD-10-CM

## 2017-01-06 DIAGNOSIS — I69359 Hemiplegia and hemiparesis following cerebral infarction affecting unspecified side: Principal | ICD-10-CM

## 2017-01-12 ENCOUNTER — Ambulatory Visit: Payer: Medicare Other | Admitting: Physical Therapy

## 2017-01-13 ENCOUNTER — Ambulatory Visit: Payer: Medicare Other

## 2017-01-15 ENCOUNTER — Ambulatory Visit: Payer: Medicare Other | Admitting: Physical Therapy

## 2017-01-19 ENCOUNTER — Ambulatory Visit: Payer: Medicare Other | Admitting: Physical Therapy

## 2017-01-20 ENCOUNTER — Ambulatory Visit: Payer: Medicare Other | Attending: Family Medicine | Admitting: Physical Therapy

## 2017-01-20 ENCOUNTER — Encounter: Payer: Self-pay | Admitting: Physical Therapy

## 2017-01-20 VITALS — BP 147/70 | HR 75

## 2017-01-20 DIAGNOSIS — R2681 Unsteadiness on feet: Secondary | ICD-10-CM | POA: Diagnosis present

## 2017-01-20 DIAGNOSIS — M6281 Muscle weakness (generalized): Secondary | ICD-10-CM | POA: Diagnosis present

## 2017-01-21 ENCOUNTER — Ambulatory Visit: Payer: Medicare Other | Admitting: Physical Therapy

## 2017-01-21 ENCOUNTER — Encounter: Payer: Self-pay | Admitting: Physical Therapy

## 2017-01-21 NOTE — Therapy (Signed)
[facility] MAIN [facility] SERVICES [address] [place], [place], [address] Phone: [phone]   Fax:  [phone]  Physical Therapy Evaluation  Patient Details  Name: Christopher [name] MRN: [id number] Date of Birth: 1925/08/26 Referring Provider: Karie Fetch  Encounter Date: 01/20/2017      PT End of Session - 01/21/17 0838    Visit Number 1   Number of Visits 17   Date for PT Re-Evaluation 03/18/17   Authorization Type Gcode 1   PT Start Time 1630   PT Stop Time 1735   PT Time Calculation (min) 65 min   Equipment Utilized During Treatment Gait belt   Activity Tolerance --  limited by hypertension   Behavior During Therapy [facility] for tasks assessed/performed      Past Medical History:  Diagnosis Date  . Arthritis   . Ataxia   . Diabetes mellitus without complication (HCC)    controlled, pt checks it in the morning   . Dyslipidemia   . Hypertension    somewhat controlled, pt checks every morning, he reports it has been good.  . Stroke (HCC)   . TIA (transient ischemic attack)     Past Surgical History:  Procedure Laterality Date  . none      Vitals:   01/20/17 1716  BP: (!) 147/70  Pulse: 75  SpO2: 98%          Subjective Assessment - 01/21/17 1418    Subjective 83 y/o male presents to pt with weakness and difficulty ambulating. He reports he is here because his right leg is not working properly. He says it has been that way for about a month. He say he has no problems on the L and does not think his weakness is getting worse.    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 Pt saw his neurologist on 6/25 for worsening L sided weakness that he had been experiencing since 12/27/16 and was referred to OP imaging. The next day he went to the ED due to uncontrolled HTN an L sided weakness.   Communication was difficult due to pt being hard of hearing and speaking english as a second language. Pt is a poor historian with somewhat inconsistant answers.   Limitations Walking;Standing   How long can you sit comfortably? several hours   How long can you stand comfortably? 5 minutes   How long can you walk comfortably? about 10 ft   Diagnostic tests MR 6/27: IMPRESSION: No acute intracranial abnormality identified. Small chronic infarcts in basal ganglia, left paramedian pons,and right frontal subcortical white matter. Stable moderate chronic microvascular ischemic changes and mild parenchymal volume loss of the brain. Single interval punctate focus of microhemorrhage and left brachium pontis. Additional scattered stable chronic foci of microhemorrhage with central predominance, likely related to chronic hypertension.   Patient Stated Goals wants to be steady when walking, and for his R LE to work the same as his L LE   Currently in Pain? No/denies   Pain Score 0-No pain   Multiple Pain Sites No            [facility] PT Assessment - 01/21/17 0001      Assessment   Medical Diagnosis L pontine stroke   Referring Provider Letta Pate, Ngwe   Onset Date/Surgical Date 10/15/16   Hand Dominance Right  Next MD Visit 01/27/17   Prior Therapy Multiple bouts of outpatient PT from past strokes with good results; denies any PT following most recent stroke;      Precautions   Precautions Fall   Precaution Comments Difficulty with ambulation and R sided weakness, history of falls with injury     Restrictions   Weight Bearing Restrictions No     Balance Screen   Has the patient fallen in the past 6 months No   How many times? 0   Has the patient had a decrease in activity level because of a fear of falling?  No   Is the patient reluctant to leave their home because of a fear of falling?  No     Home Environment   Living Environment Private residence   Living Arrangements Children   Available  Help at Discharge Family   Type of Home House   Home Access Ramped entrance   Entrance Stairs-Number of Steps 0   Entrance Stairs-Rails None  Pt reports he does not use a railing, only his quad cane   Home Layout Two level   Alternate Level Stairs-Number of Steps 7   Alternate Level Stairs-Rails Right   Home Equipment Walker - 2 wheels;Cane - quad  Pt prefers quad cane   Additional Comments Uses RW in the house, but uses quad cane when out of the house     Prior Function   Level of Independence Needs assistance with ADLs   Level of Independence - Bath Minimal   Toileting Minimal   Vocation Retired   [employer] N/A   Leisure Watching sports on tv   Comments Pt has a hired care-taker that assists him with bathing and toileting.     Cognition   Overall Cognitive Status History of cognitive impairments - at baseline   Memory Impaired  Pt has Hx of vascular dementia      Observation/Other Assessments   Observations Pt appears well dressed and fed. Appears to have decreased cognitive processing speed and some confusion based on Hx taking interaction.     Sensation   Light Touch Appears Intact   Additional Comments Pt reports no deficits      Coordination   Gross Motor Movements are Fluid and Coordinated No  as exhibited by ataxic gait    Fine Motor Movements are Fluid and Coordinated No   Coordination and Movement Description Pt presents with mild-mod ataxia and incoordination causing his movement to be very slow    Finger Nose Finger Test Decreased coordination bilaterally, slightly worse on L, overall decreased speed of movement     Posture/Postural Control   Posture/Postural Control Postural limitations   Postural Limitations Rounded Shoulders;Forward head   Posture Comments Sits with mild slumped posture and increased kyphosis     ROM / Strength   AROM / PROM / Strength Strength     AROM   Overall AROM  Deficits   Overall AROM Comments Pt is functionally  hypomobile during gait and transfers with decreased hip and pelvic motion causing abnormal gait     Strength   Overall Strength Comments Gross BUE is 4 to 4+/5    Right Hip Flexion 3+/5   Right Hip ABduction 4-/5   Right Hip ADduction 4-/5   Left Hip Flexion 4-/5   Left Hip ABduction 4-/5   Left Hip ADduction 4-/5   Right Knee Flexion 4-/5   Right Knee Extension 4/5   Left Knee Flexion 4/5  Left Knee Extension 4/5   Right Ankle Dorsiflexion 4/5   Left Ankle Dorsiflexion 4/5     Transfers   Transfers Sit to Stand;Stand to Sit   Sit to Stand 6: Modified independent (Device/Increase time)   Stand to Sit 6: Modified independent (Device/Increase time)   Comments Able to transfer with/without use of BUEs, safety and stability are increased with UE support     Ambulation/Gait   Ambulation/Gait Yes   Ambulation/Gait Assistance 4: Min guard   Ambulation Distance (Feet) 550 Feet   Assistive device Large base quad cane   Gait Comments Pt ambulates with shuffle gait with decreased foot clearance in swing bilaterally. He is mildly off balance during ambulation; however he is able to self correct and maintain balance throughout; slower gait speed, decrease in step length bilaterally with R worse than L     6 Minute Walk- Baseline   BP (mmHg) 147/70   HR (bpm) 75   02 Sat (%RA) 98 %     6 Minute walk- Post Test   BP (mmHg) 151/77   HR (bpm) 81   02 Sat (%RA) 99 %     6 minute walk test results    Aerobic Endurance Distance Walked 550   Endurance additional comments With [facility]; patient was able to walk 675 feet in Jan 2017 without AD; less than community ambulator distance of 1000 feet; less than age group norms      Balance   Balance Assessed Yes     Dynamic Standing Balance   Dynamic Standing - Comments During ambulation with RUE support using quad cane pt had several minor LOB due to decraesed R foot clearance in swing causing him to trip. This combined with incoordination makes  his dynamic standing balance poor     Standardized Balance Assessment   Five times sit to stand comments  16.18  Pt is a high fall risk for his age group      High Level Balance   High Level Balance Comments Increased sway during static stand without UE support, pt has repeated minor LOB during dynamic balance due to decreased foot clearance in swing            Objective measurements completed on examination: See above findings.                  PT Education - 01/21/17 [phone]    Education provided Yes   Education Details Plan of care, outcome of 6 min walk test, importance of HEP   Person(s) Educated Patient   Methods Explanation;Demonstration;Verbal cues   Comprehension Verbalized understanding;Returned demonstration;Verbal cues required             PT Long Term Goals - 01/21/17 1106      PT LONG TERM GOAL #1   Title Patient will be independent in home exercise program to improve strength/mobility for better functional independence with ADLs   Time 8   Period Weeks   Status New     PT LONG TERM GOAL #2   Title Patient will increase 10 meter walk test to >0.11m/s as to improve gait speed for better community ambulation and to reduce fall risk   Baseline .49 m/sec from 06/17/16   Time 8   Period Weeks   Status New     PT LONG TERM GOAL #3   Title Patient will increase gross BLE strength to 4+/5 in order to demonstrate safety with ADLs.     Baseline 4- grossly  Time 8   Period Weeks   Status New     PT LONG TERM GOAL #4   Title Patient will increase six minute walk test distance to >1000 for progression to community ambulator and improve gait ability   Baseline 550 on 7/17/118   Time 8   Period Weeks   Status New     PT LONG TERM GOAL #5   Title Patient (> 81 years old) will complete five times sit to stand test in < 15 seconds indicating an increased LE strength and improved balance.   Baseline 16.18 sec on 01/20/17   Time 8   Period Weeks    Status New                Plan - 01/21/17 0841    Clinical Impression Statement Christopher [name] is a pleasant 83 y/o male who presents to therapy with chronic hemiparesis and a Hx of multiple strokes. He is limited by abnormality of gait, decreased activity tolerance, and self-reported R side weakness. He has HTN that is only moderately controlled. He has been to the ED in the last 3 weeks with systolic BP of 215. He is supposed to be checking his BP twice a day. He is a poor historian due to hardness of hearing and being a non-native [place] speaker. Pt is able to transfer sit<>stand safely without armrests; his 5x sit to stand test indicates that he is at an increased fall risk for his age group. This result is only slightly worse than his last sit to stand test at this clinic on 05/15/16. He has decreased gate speed with shuffle gate pattern. During 6 min walk test he had several minor losses of balance to the R due to decrease foot clearance on the R causing him to trip. His 6 min walk test indicated that he has had a small meaningful decline since his last test in Jan 2017 which he conducted without AD, whereas he now uses a quad cane for stability. He remains limited in mobility at a sub-community ambulation distance based on 6 min walk test. His standing balance is poor due to incoordination and limited balance strategies based on therapist observation. Additionally pt has self-reported visual deficits, and apparent memory limitations. It is unclear if pt has 24 hour supervision at home; chart review indicates he does have 24/7 supervision, but pt reports that he is alone during the day and only has supervision in the morning and at night. Pt is appropriate for skilled therapy at this time to address his deficits in activity tolerance, gait speed, and strength, and to maximize his functional mobility while minimizing his fall risk.    History and Personal Factors relevant to plan of care: Advanced  age, recent loss of his wife, high education level, prior experience with physical therapy, pt is motivated to get better and achnowleges his deficits,    Clinical Presentation Evolving   Clinical Presentation due to: HTN that is not fully controlled, memory deficits, high fall risk without history of recent falls, though he has had falls in the past year, multiple body systems involved, multiple personal factors affecting plan of care,    Clinical Decision Making Moderate   Rehab Potential Fair   Clinical Impairments Affecting Rehab Potential Positive: motivated, family support, assistive services at home; Negative: chronic issue including multiple strokes, previous falls resulting in injury   PT Frequency 2x / week   PT Duration 8 weeks   PT Treatment/Interventions ADLs/Self Care  Home Management;Aquatic Therapy;Biofeedback;Cryotherapy;Electrical Stimulation;Moist Heat;DME Instruction;Gait training;Stair training;Functional mobility training;Therapeutic activities;Therapeutic exercise;Balance training;Neuromuscular re-education;Patient/family education;Manual techniques;Passive range of motion;Energy conservation;Canalith Repostioning;Cognitive remediation;Dry needling;Taping;Vestibular;Visual/perceptual remediation/compensation   PT Next Visit Plan Initiate HEP, 67m walk test, further assess balance, dynamic stability, gait training, progress BLE strengthening   PT Home Exercise Plan Will initiate next session   Consulted and Agree with Plan of Care Patient      Patient will benefit from skilled therapeutic intervention in order to improve the following deficits and impairments:  Decreased activity tolerance, Decreased balance, Decreased endurance, Decreased mobility, Decreased strength, Difficulty walking, Hypomobility, Impaired flexibility, Improper body mechanics, Postural dysfunction, Abnormal gait, Cardiopulmonary status limiting activity, Decreased coordination, Decreased cognition, Decreased  knowledge of use of DME, Impaired vision/preception  Visit Diagnosis: Unsteadiness on feet - Plan: PT plan of care cert/re-cert  Muscle weakness (generalized) - Plan: PT plan of care cert/re-cert      G-Codes - 10-Feb-2017 1502    Functional Assessment Tool Used (Outpatient Only) clinical judgement, 6 min walk, 5 times sit<>Stand, strength   Functional Limitation Mobility: Walking and moving around   Mobility: Walking and Moving Around Current Status ([id number]) At least 40 percent but less than 60 percent impaired, limited or restricted   Mobility: Walking and Moving Around Goal Status [phone]) At least 20 percent but less than 40 percent impaired, limited or restricted       Problem List Patient Active Problem List   Diagnosis Date Noted  . Hypertensive urgency 12/31/2016  . Stroke (HCC) 10/15/2016  . TIA (transient ischemic attack) 03/07/2016  . Acute CVA (cerebrovascular accident) (HCC) 07/03/2015   Christopher [name] Clydene Laming, SPT This entire session was performed under direct supervision and direction of a licensed therapist/therapist assistant . I have personally read, edited and approve of the note as written.  Trotter,Margaret PT, DPT 2017/02/10, 3:05 PM  [facility] MAIN [facility] SERVICES [address] [place], [place], [address] Phone: [phone]   Fax:  [phone]  Name: Kingson Lohmeyer MRN: [id number] Date of Birth: [birth year]

## 2017-01-22 ENCOUNTER — Encounter: Payer: Self-pay | Admitting: Physical Therapy

## 2017-01-22 ENCOUNTER — Ambulatory Visit: Payer: Medicare Other | Admitting: Physical Therapy

## 2017-01-22 DIAGNOSIS — R2681 Unsteadiness on feet: Secondary | ICD-10-CM

## 2017-01-22 DIAGNOSIS — M6281 Muscle weakness (generalized): Secondary | ICD-10-CM

## 2017-01-22 NOTE — Therapy (Signed)
[place] MAIN [facility] SERVICES [address] [place], [place], [address] Phone: [phone]   Fax:  [phone]  Physical Therapy Treatment  Patient Details  Name: Christopher [name] MRN: [id number] Date of Birth: July 20, 1925 Referring Provider: Tomasa Hose  Encounter Date: 01/22/2017      PT End of Session - 01/22/17 1755    Visit Number 2   Number of Visits 17   Date for PT Re-Evaluation 03/18/17   Authorization Type Gcode 2   Authorization Time Period 10   PT Start Time 1645   PT Stop Time 1746   PT Time Calculation (min) 61 min   Equipment Utilized During Treatment Gait belt   Activity Tolerance Patient tolerated treatment well;No increased pain  limited by hypertension   Behavior During Therapy [facility] for tasks assessed/performed      Past Medical History:  Diagnosis Date  . Arthritis   . Ataxia   . Diabetes mellitus without complication ([place])    controlled, pt checks it in the morning   . Dyslipidemia   . Hypertension    somewhat controlled, pt checks every morning, he reports it has been good.  . Stroke ([place])   . TIA (transient ischemic attack)     Past Surgical History:  Procedure Laterality Date  . none      There were no vitals filed for this visit.      Subjective Assessment - 01/22/17 1753    Subjective Pt reports no changes since last session; his son was with him at therapy today and assisted with communication with the pt. His son expressed that he and the pt were primarily interested in using exercise machines in therapy.    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 Pt saw his neurologist on 6/25 for worsening L sided weakness that he had been experiencing since 12/27/16 and was referred to OP imaging. The next day he went to the ED due to uncontrolled HTN an L sided weakness.   Communication was difficult due to pt being hard of hearing and speaking english as a second language. Pt is a poor historian with somewhat inconsistant answers.   Limitations Walking;Standing   How long can you sit comfortably? several hours   How long can you stand comfortably? 5 minutes   How long can you walk comfortably? about 10 ft   Diagnostic tests MR 6/27: IMPRESSION: No acute intracranial abnormality identified. Small chronic infarcts in basal ganglia, left paramedian pons,and right frontal subcortical white matter. Stable moderate chronic microvascular ischemic changes and mild parenchymal volume loss of the brain. Single interval punctate focus of microhemorrhage and left brachium pontis. Additional scattered stable chronic foci of microhemorrhage with central predominance, likely related to chronic hypertension.   Patient Stated Goals wants to be steady when walking, and for his R LE to work the same as his L LE   Currently in Pain? No/denies         Treatment: Assessment:  Pt assessed in 12mwalk test at the beginning of therapy with gait speed of 0.424m indicating pt is a household/limited community ambulator and at increased risk for hospitalization and adverse events. This isthe same as his last gait speed recorded on 06/17/16.   Stretching in supine in order to increase hip functional AROM and reduce soft tissue tightness to promote  improved gait mechanics and efficiency: -Pt and his son educated on the importance of stretching and full ROM for functional gait training.  Thomas test position hip flexor stretch for 1 and 2 joint hip flexors, 1 x 30 sec each side, pt had no apparent hip flexor tightness.  Glut med stretch 1 x 30 sec each side with cues to bring knee to opposite shoulder; pt required verbal and tactile cueing to provide stretch towards the opposite shoulder. Figure 4 stretch for piriformis tightness. 1 x 30 sec each side; pt had difficulty understanding this  stretch, though once he was in the correct position he reported feeling a good stretch.  Hamstring stretch in supine assessed by therapist with pt able to achieve well over 45 degrees in SLR on both sides without end feel or pain by gross assessment.  Hamstring stretch in sitting 1 x 30 sec each side with 1 LE on mat table and the other on the floor; pt cued to reach towards foot of the extended LE.  HEP handout given for each of these exercises with explanation and demonstration to ensure pt can perform them with good form and safety. Pt's son was with pt and had good understanding of exercises and said he would be available to assist pt with them.   NMR in parallel bars: Stepping exercise/ mini lunge to front, and each side x 15 each direction with B UE support, and max verbal and tactile cues for correct form; pt cued to kick off of flexed LE when coming out of each lunge; all in order to encourage increased functional hip ROM.  Pt given handout to conduct forward lunge with RW for safety, and for lateral lunge using the back of a chair for UE support at home. Pt educated in exercise and assessed for safety and proper technique.    25mwalk test re-evaluated after NMR training:  0.5629m with Qcane and CGA, improved from 0.42 at start of session    Also assessed with RW: 0.7167mwith RW and CGA, indicating large improvement to limited community ambulator. Pt had noticeably increased stride length following lunges.   NuStep x 5 min at level 2 for cardiovascular response with cues to maintain SPM over 50, pt was able to maintain avg of 53 SPM  Vitals at start: BP: 134/74, 72BPM, SpO2 100% Vitals after: BP: 161/70, 73BPM, SpO299%         PT Education - 01/22/17 1755    Education provided Yes   Education Details ther-ex, importance of HEP, HEP given, stretching   Person(s) Educated Patient;Child(ren)   Methods Explanation;Demonstration;Tactile cues;Verbal cues   Comprehension Verbal cues  required;Tactile cues required;Returned demonstration;Verbalized understanding             PT Long Term Goals - 01/22/17 1759      PT LONG TERM GOAL #1   Title Patient will be independent in home exercise program to improve strength/mobility for better functional independence with ADLs   Time 8   Period Weeks   Status New     PT LONG TERM GOAL #2   Title Patient will increase 10 meter walk test to >0.29m/46ms to improve gait speed for better community ambulation and to reduce fall risk   Baseline 0.51m/27m beginning of therapy with quad cane, 0.72m/s49mlowing therapy with RW  7/19    Time 8   Period Weeks   Status Partially Met     PT LONG TERM GOAL #3   Title  Patient will increase gross BLE strength to 4+/5 in order to demonstrate safety with ADLs.     Baseline 4- grossly   Time 8   Period Weeks   Status New     PT LONG TERM GOAL #4   Title Patient will increase six minute walk test distance to >1000 for progression to community ambulator and improve gait ability   Baseline 550 on 7/17/118   Time 8   Period Weeks   Status New     PT LONG TERM GOAL #5   Title Patient (> 60 years old) will complete five times sit to stand test in < 15 seconds indicating an increased LE strength and improved balance.   Baseline 16.18 sec on 01/20/17   Time 8   Period Weeks   Status New               Plan - 01/22/17 1757    Clinical Impression Statement Pt led in ther-ex and NMR to improve LE flexibility and to increase his functional AROM during gait for normalized gait pattern. He required cues for correct exercise technique and positioning. Pt had a very large improvement in his gait speed with RW following treatment indicating he was a limited community ambulator. He has noticeably increased stride length and improved gait mechanics and stability with RW. Pt and his son were recommended to use RW instead of Qcane. He also was led in cardiovascular exercise on NuStep with an  appropriate cardiovasular response. Pt will benefit from continued skilled therapy in order to maximize safety and functional independence with mobility and ADLs/IADLs.    Rehab Potential Fair   Clinical Impairments Affecting Rehab Potential Positive: motivated, family support, assistive services at home; Negative: chronic issue including multiple strokes, previous falls resulting in injury   PT Frequency 2x / week   PT Duration 8 weeks   PT Treatment/Interventions ADLs/Self Care Home Management;Aquatic Therapy;Biofeedback;Cryotherapy;Electrical Stimulation;Moist Heat;DME Instruction;Gait training;Stair training;Functional mobility training;Therapeutic activities;Therapeutic exercise;Balance training;Neuromuscular re-education;Patient/family education;Manual techniques;Passive range of motion;Energy conservation;Canalith Repostioning;Cognitive remediation;Dry needling;Taping;Vestibular;Visual/perceptual remediation/compensation   PT Next Visit Plan Initiate HEP, 25mwalk test, further assess balance, dynamic stability, gait training, progress BLE strengthening   PT Home Exercise Plan Will initiate next session   Consulted and Agree with Plan of Care Patient      Patient will benefit from skilled therapeutic intervention in order to improve the following deficits and impairments:  Decreased activity tolerance, Decreased balance, Decreased endurance, Decreased mobility, Decreased strength, Difficulty walking, Hypomobility, Impaired flexibility, Improper body mechanics, Postural dysfunction, Abnormal gait, Cardiopulmonary status limiting activity, Decreased coordination, Decreased cognition, Decreased knowledge of use of DME, Impaired vision/preception  Visit Diagnosis: Unsteadiness on feet  Muscle weakness (generalized)   Problem List Patient Active Problem List   Diagnosis Date Noted  . Hypertensive urgency 12/31/2016  . Stroke ([place] 10/15/2016  . TIA (transient ischemic attack) 03/07/2016   . Acute CVA (cerebrovascular accident) ([place] 07/03/2015   Charnell Peplinski MLenis Dickinson SPT This entire session was performed under direct supervision and direction of a licensed therapist/therapist assistant . I have personally read, edited and approve of the note as written.  Trotter,Margaret PT, DPT 01/22/2017, 6:17 PM  CPenderMAIN [facility]urg[facility] LLCSERVICES [address] [address]Phone: [phone]  Fax:  [phone] Name: IEfstathios SawinMRN: [id number] of Birth: [birth year]

## 2017-01-26 ENCOUNTER — Ambulatory Visit: Payer: Medicare Other | Admitting: Physical Therapy

## 2017-01-27 ENCOUNTER — Ambulatory Visit: Payer: Medicare Other | Admitting: Physical Therapy

## 2017-01-27 DIAGNOSIS — R2681 Unsteadiness on feet: Secondary | ICD-10-CM | POA: Diagnosis not present

## 2017-01-27 DIAGNOSIS — M6281 Muscle weakness (generalized): Secondary | ICD-10-CM

## 2017-01-27 NOTE — Therapy (Signed)
[place] MAIN [facility] SERVICES [address] [place], [place], [address] Phone: [phone]   Fax:  [phone]  Physical Therapy Treatment  Patient Details  Name: Christopher [name] MRN: [id number] Date of Birth: June 08, 1926 Referring Provider: Tomasa Hose  Encounter Date: 01/27/2017      PT End of Session - 01/27/17 1818    Visit Number 3   Number of Visits 17   Date for PT Re-Evaluation 03/18/17   Authorization Type Gcode 3   Authorization Time Period 10   PT Start Time 6384   PT Stop Time 6659   PT Time Calculation (min) 35 min   Equipment Utilized During Treatment Gait belt   Activity Tolerance Patient tolerated treatment well;No increased pain  limited by hypertension   Behavior During Therapy [facility] for tasks assessed/performed      Past Medical History:  Diagnosis Date  . Arthritis   . Ataxia   . Diabetes mellitus without complication ([place])    controlled, pt checks it in the morning   . Dyslipidemia   . Hypertension    somewhat controlled, pt checks every morning, he reports it has been good.  . Stroke ([place])   . TIA (transient ischemic attack)     Past Surgical History:  Procedure Laterality Date  . none      There were no vitals filed for this visit.      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 Pt saw his neurologist on 6/25 for worsening L sided weakness that he had been experiencing since 12/27/16 and was referred to OP imaging. The next day he went to the ED due to uncontrolled HTN an L sided weakness.  Communication was difficult due to pt being hard of hearing and speaking english as a  second language. Pt is a poor historian with somewhat inconsistant answers.   Limitations Walking;Standing   How long can you sit comfortably? several hours   How long can you stand comfortably? 5 minutes   How long can you walk comfortably? about 10 ft   Diagnostic tests MR 6/27: IMPRESSION: No acute intracranial abnormality identified. Small chronic infarcts in basal ganglia, left paramedian pons,and right frontal subcortical white matter. Stable moderate chronic microvascular ischemic changes and mild parenchymal volume loss of the brain. Single interval punctate focus of microhemorrhage and left brachium pontis. Additional scattered stable chronic foci of microhemorrhage with central predominance, likely related to chronic hypertension.   Patient Stated Goals wants to be steady when walking, and for his R LE to work the same as his L LE        PT TREATMENT;  Nu Step; x 4 min BUE and BLE; cued to keep steps above 60 for cardiovascular fitness  Vitals after Nu Step; 152/76   Parallel bars;   Mini lunge fwd and lateral x 10 ea; mod cues for proper foot positioning for better exercise; and mod cues for larger step to improve gait step length; BUE support needed with rails  Side stepping with red tband resistance 4 x 60f;  Stepping over obstacles; Stepping over 2  foam roller  about 2 feet apart then Step ups 5" step alt leading with R/L;  Pt was able to use finger tip assist from rails when stepping over objects; Pt cue for larger step length and to alternating stepping pattern; Pt favors stepping first with L LE;  On airex; toe taps on 3 cones alt R/L; Pt had more difficulty with flexing right hip and controlling it while tapping cones; x 5 ea On airex; reaching for cones incorporating some trunk and head rotation; pt demonstrated exercise with min sway and no LOB; x 5 ea way;    4 square stepping x 5; Pt demonstrated more sway and loss of control; pt needed CGA for safety;  Gait;  138f walking with SBQC in L/R hand; Pt reported using it in both hands; Pt demonstrated a reciprocal gait pattern and with min cues cued increase the step length; At times pt demonstrated gait with almost no use of the [facility]                          PT Education - 01/27/17 1818    Education provided Yes   Education Details strengthening; balance   Person(s) Educated Patient   Methods Explanation;Demonstration;Verbal cues   Comprehension Verbalized understanding;Returned demonstration;Verbal cues required             PT Long Term Goals - 01/22/17 1759      PT LONG TERM GOAL #1   Title Patient will be independent in home exercise program to improve strength/mobility for better functional independence with ADLs   Time 8   Period Weeks   Status New     PT LONG TERM GOAL #2   Title Patient will increase 10 meter walk test to >0.865m as to improve gait speed for better community ambulation and to reduce fall risk   Baseline 0.4213mat beginning of therapy with quad cane, 0.24m72mollowing therapy with RW  7/19    Time 8   Period Weeks   Status Partially Met     PT LONG TERM GOAL #3   Title Patient will increase gross BLE strength to 4+/5 in order to demonstrate safety with ADLs.     Baseline 4- grossly   Time 8   Period Weeks   Status New     PT LONG TERM GOAL #4   Title Patient will increase six minute walk test distance to >1000 for progression to community ambulator and improve gait ability   Baseline 550 on 7/17/118   Time 8   Period Weeks   Status New     PT LONG TERM GOAL #5   Title Patient (> 60 y90rs old) will complete five times sit to stand test in < 15 seconds indicating an increased LE strength and improved balance.   Baseline 16.18 sec on 01/20/17   Time 8   Period Weeks   Status New               Plan - 01/27/17 1821    Clinical Impression Statement Patient was late to session today; Pt was instructed in LE strengthening,  balance and gait activities; Pt was cued on taking large steps with both feet in order to improve the shuffle gait pattern previously demonstrated; Pt was able to ambulate with larger step length if cued; Pt feels more comfortable with RW and [facility]; Pt was able to maintain balance with SBQC while walking; Pt would continue to benefit  from skilled PT in order to improve strength, balance and gait safety;    Rehab Potential Fair   Clinical Impairments Affecting Rehab Potential Positive: motivated, family support, assistive services at home; Negative: chronic issue including multiple strokes, previous falls resulting in injury   PT Frequency 2x / week   PT Duration 8 weeks   PT Treatment/Interventions ADLs/Self Care Home Management;Aquatic Therapy;Biofeedback;Cryotherapy;Electrical Stimulation;Moist Heat;DME Instruction;Gait training;Stair training;Functional mobility training;Therapeutic activities;Therapeutic exercise;Balance training;Neuromuscular re-education;Patient/family education;Manual techniques;Passive range of motion;Energy conservation;Canalith Repostioning;Cognitive remediation;Dry needling;Taping;Vestibular;Visual/perceptual remediation/compensation   PT Next Visit Plan Initiate HEP, 8mwalk test, further assess balance, dynamic stability, gait training, progress BLE strengthening   PT Home Exercise Plan Will initiate next session   Consulted and Agree with Plan of Care Patient      Patient will benefit from skilled therapeutic intervention in order to improve the following deficits and impairments:  Decreased activity tolerance, Decreased balance, Decreased endurance, Decreased mobility, Decreased strength, Difficulty walking, Hypomobility, Impaired flexibility, Improper body mechanics, Postural dysfunction, Abnormal gait, Cardiopulmonary status limiting activity, Decreased coordination, Decreased cognition, Decreased knowledge of use of DME, Impaired  vision/preception  Visit Diagnosis: Unsteadiness on feet  Muscle weakness (generalized)     Problem List Patient Active Problem List   Diagnosis Date Noted  . Hypertensive urgency 12/31/2016  . Stroke ([place] 10/15/2016  . TIA (transient ischemic attack) 03/07/2016  . Acute CVA (cerebrovascular accident) ([place] 07/03/2015   MDoreene Nest SPT This entire session was performed under direct supervision and direction of a licensed therapist/therapist assistant . I have personally read, edited and approve of the note as written.  Trotter,Margaret PT, DPT 01/28/2017, 8:40 AM  CSpring ArborMAIN RLaser [facility] Surgery Center Of The Palm BeachesSERVICES [address] [address]Phone: [phone]  Fax:  [phone] Name: Christopher MahanyMRN: [id number] of Birth: 9January 09, 1927

## 2017-01-28 ENCOUNTER — Ambulatory Visit: Payer: Medicare Other | Admitting: Physical Therapy

## 2017-01-29 ENCOUNTER — Encounter: Payer: Self-pay | Admitting: Physical Therapy

## 2017-01-29 ENCOUNTER — Ambulatory Visit: Payer: Medicare Other | Admitting: Physical Therapy

## 2017-01-29 ENCOUNTER — Other Ambulatory Visit: Payer: Self-pay

## 2017-01-29 DIAGNOSIS — M6281 Muscle weakness (generalized): Secondary | ICD-10-CM

## 2017-01-29 DIAGNOSIS — R2681 Unsteadiness on feet: Secondary | ICD-10-CM | POA: Diagnosis not present

## 2017-01-29 NOTE — Patient Instructions (Signed)
   Band Walk: Zig Zag   Tie green band around legs, just above knees. Walk forward _both__ feet in a zig zag pattern. Without turning walk backward to start for one zig zag. Repeat _2-3__ zig zags per session.   [email or web address].exer.[email or web address]   Copyright  VHI. All rights reserved.

## 2017-01-29 NOTE — Therapy (Signed)
[place] MAIN [facility] SERVICES [address] [place], [place], [address] Phone: [phone]   Fax:  [phone]  Physical Therapy Treatment  Patient Details  Name: Christopher [name] MRN: [id number] Date of Birth: 06-07-1926 Referring Provider: Tomasa Hose  Encounter Date: 01/29/2017      PT End of Session - 01/29/17 1549    Visit Number 4   Number of Visits 17   Date for PT Re-Evaluation 03/18/17   Authorization Type Gcode 4   Authorization Time Period 10   PT Start Time 1534   PT Stop Time 1629   PT Time Calculation (min) 55 min   Equipment Utilized During Treatment Gait belt   Activity Tolerance Patient tolerated treatment well;No increased pain  limited by hypertension   Behavior During Therapy [facility] for tasks assessed/performed      Past Medical History:  Diagnosis Date  . Arthritis   . Ataxia   . Diabetes mellitus without complication ([place])    controlled, pt checks it in the morning   . Dyslipidemia   . Hypertension    somewhat controlled, pt checks every morning, he reports it has been good.  . Stroke ([place])   . TIA (transient ischemic attack)     Past Surgical History:  Procedure Laterality Date  . none      There were no vitals filed for this visit.      Subjective Assessment - 01/29/17 1543    Subjective Pt presents to therapy with his son; he reports he is doing well; son reports no falls or changes since last visit. Pt heports no pain at this time. He continues to walk with quad cane.   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 Pt saw his neurologist on 6/25 for worsening L sided weakness that he had been experiencing since 12/27/16 and was referred to OP imaging. The next day he went to the ED due to uncontrolled HTN an L sided weakness.  Communication was difficult due to pt  being hard of hearing and speaking english as a second language. Pt is a poor historian with somewhat inconsistant answers.   Limitations Walking;Standing   How long can you sit comfortably? several hours   How long can you stand comfortably? 5 minutes   How long can you walk comfortably? about 10 ft   Diagnostic tests MR 6/27: IMPRESSION: No acute intracranial abnormality identified. Small chronic infarcts in basal ganglia, left paramedian pons,and right frontal subcortical white matter. Stable moderate chronic microvascular ischemic changes and mild parenchymal volume loss of the brain. Single interval punctate focus of microhemorrhage and left brachium pontis. Additional scattered stable chronic foci of microhemorrhage with central predominance, likely related to chronic hypertension.   Patient Stated Goals wants to be steady when walking, and for his R LE to work the same as his L LE   Currently in Pain? No/denies           PT TREATMENT;   Nu Step; x 6 min at level 5, avg SPM: 66. BUE and BLE; cued to keep steps above 60 for cardiovascular fitness; pt reports the exercise was not too difficult despite increase in resistance. Pt reports no chest pain and has no signs of cardiovascular distress.      Parallel bars;  Mini lunge fwd and lateral x 10 each,  40 total lunges; mod cues for proper foot positioning and for keeping one leg straight as he lunges for better mechanics; and mod cues for larger step to improve gait step length; BUE support needed with rails; pt cued to decrease UE support, but he cotinued to support himself through UEs; cues to kick off of forward leg for functional balance.    Side stepping with red t-band resistance 4 x 34f in order to increase hip abd strength for more efficient gait.     On airex; toe taps on 3 therapy stones placed left, right, and center positions on top of 5" step + 2" foam for increased difficulty; Pt had more difficulty with flexing right hip  and controlling it while tapping cones; 10 x 3 taps each LE. Pt had more difficulty with balance in stance on RLE, so foam under therapy stones were removed to bring target closer to pt for reduced difficulty. Pt received max cues to decrease UE support gradually to no UE support, however he continued to use UE support or lean on parallel bars.   Gait training no AD:  X 15105fpt continues to demonstrate a reciprocal shuffle gait pattern and with min cues cued increase the step length; pt's stride length is inconsistent without AD, however he does have less of a shuffle gait with increased stride length without AD. PT continues to recommend against quad cane in favor of RW.          PT Education - 01/29/17 1549    Education Details ther-ex, balance training, HEP updated   Person(s) Educated Patient;Child(ren)   Methods Explanation;Demonstration;Tactile cues;Verbal cues;Handout   Comprehension Verbal cues required;Returned demonstration;Verbalized understanding;Tactile cues required             PT Long Term Goals - 01/22/17 1759      PT LONG TERM GOAL #1   Title Patient will be independent in home exercise program to improve strength/mobility for better functional independence with ADLs   Time 8   Period Weeks   Status New     PT LONG TERM GOAL #2   Title Patient will increase 10 meter walk test to >0.70m61mas to improve gait speed for better community ambulation and to reduce fall risk   Baseline 0.25m7mt beginning of therapy with quad cane, 0.46m/72mllowing therapy with RW  7/19    Time 8   Period Weeks   Status Partially Met     PT LONG TERM GOAL #3   Title Patient will increase gross BLE strength to 4+/5 in order to demonstrate safety with ADLs.     Baseline 4- grossly   Time 8   Period Weeks   Status New     PT LONG TERM GOAL #4   Title Patient will increase six minute walk test distance to >1000 for progression to community ambulator and improve gait ability    Baseline 550 on 7/17/118   Time 8   Period Weeks   Status New     PT LONG TERM GOAL #5   Title Patient (> 60 ye14s old) will complete five times sit to stand test in < 15 seconds indicating an increased LE strength and improved balance.   Baseline 16.18 sec on 01/20/17   Time 8   Period Weeks   Status New               Plan - 01/29/17 1636    Clinical Impression Statement Pt was led in ther-ex on  NuStep and in balance and neuro re-education training in parallel bars. Pt requires extensive verbal and tactile cueing for correct form and to avoid compensations. He does appear to be progressing in SLS tolerance with toe taps. Pt also continues to use his quad cane despite recommendation from therapist to use RW for increased safety and mobility. HEP was updated with handout and t-band given; pt's son reports pt's aide will lead him in the exercises. Pt will benefit from continued skilled therapy in order to maximize safety and functional independence with mobility and ADLs/IADLs.    Rehab Potential Fair   Clinical Impairments Affecting Rehab Potential Positive: motivated, family support, assistive services at home; Negative: chronic issue including multiple strokes, previous falls resulting in injury   PT Frequency 2x / week   PT Duration 8 weeks   PT Treatment/Interventions ADLs/Self Care Home Management;Aquatic Therapy;Biofeedback;Cryotherapy;Electrical Stimulation;Moist Heat;DME Instruction;Gait training;Stair training;Functional mobility training;Therapeutic activities;Therapeutic exercise;Balance training;Neuromuscular re-education;Patient/family education;Manual techniques;Passive range of motion;Energy conservation;Canalith Repostioning;Cognitive remediation;Dry needling;Taping;Vestibular;Visual/perceptual remediation/compensation   PT Next Visit Plan Initiate HEP, 29mwalk test, further assess balance, dynamic stability, gait training, progress BLE strengthening   PT Home Exercise  Plan Will initiate next session   Consulted and Agree with Plan of Care Patient      Patient will benefit from skilled therapeutic intervention in order to improve the following deficits and impairments:  Decreased activity tolerance, Decreased balance, Decreased endurance, Decreased mobility, Decreased strength, Difficulty walking, Hypomobility, Impaired flexibility, Improper body mechanics, Postural dysfunction, Abnormal gait, Cardiopulmonary status limiting activity, Decreased coordination, Decreased cognition, Decreased knowledge of use of DME, Impaired vision/preception  Visit Diagnosis: Unsteadiness on feet  Muscle weakness (generalized)     Problem List Patient Active Problem List   Diagnosis Date Noted  . Hypertensive urgency 12/31/2016  . Stroke ([place] 10/15/2016  . TIA (transient ischemic attack) 03/07/2016  . Acute CVA (cerebrovascular accident) ([place] 07/03/2015   Donita Newland, SPT This entire session was performed under direct supervision and direction of a licensed therapist/therapist assistant . I have personally read, edited and approve of the note as written.  Trotter,Margaret PT, DPT 01/29/2017, 4:59 PM  [place] [facility] [address] [address]Phone: [phone]  Fax:  [phone] Name: ISigfredo SchreierMRN: [id number] of Birth: [birth year]

## 2017-01-30 ENCOUNTER — Other Ambulatory Visit: Payer: Self-pay

## 2017-01-30 NOTE — Patient Outreach (Signed)
Telephone outreach to patient to obtain mRs was successfully completed. mRs= 3. 

## 2017-02-02 ENCOUNTER — Ambulatory Visit: Payer: Medicare Other | Admitting: Physical Therapy

## 2017-02-03 ENCOUNTER — Ambulatory Visit: Payer: Medicare Other | Admitting: Physical Therapy

## 2017-02-03 ENCOUNTER — Encounter: Payer: Self-pay | Admitting: Physical Therapy

## 2017-02-03 DIAGNOSIS — R2681 Unsteadiness on feet: Secondary | ICD-10-CM | POA: Diagnosis not present

## 2017-02-03 DIAGNOSIS — M6281 Muscle weakness (generalized): Secondary | ICD-10-CM

## 2017-02-03 NOTE — Therapy (Signed)
[place] MAIN [facility] SERVICES [address] [place], [place], [address] Phone: [phone]   Fax:  [phone]  Physical Therapy Treatment  Patient Details  Name: Christopher [name] MRN: [id number] Date of Birth: [birth year] Referring Provider: Tomasa Hose  Encounter Date: 02/03/2017      PT End of Session - 02/03/17 1629    Visit Number 5   Number of Visits 17   Date for PT Re-Evaluation 03/18/17   Authorization Type Gcode 5   Authorization Time Period 10   PT Start Time 1615   PT Stop Time 1700   PT Time Calculation (min) 45 min   Equipment Utilized During Treatment Gait belt   Activity Tolerance Patient tolerated treatment well;No increased pain   Behavior During Therapy WFL for tasks assessed/performed      Past Medical History:  Diagnosis Date  . Arthritis   . Ataxia   . Diabetes mellitus without complication ([employer])    controlled, pt checks it in the morning   . Dyslipidemia   . Hypertension    somewhat controlled, pt checks every morning, he reports it has been good.  . Stroke ([place])   . TIA (transient ischemic attack)     Past Surgical History:  Procedure Laterality Date  . none      There were no vitals filed for this visit.      Subjective Assessment - 02/03/17 1622    Subjective Pt presents to therapy with son for translation; Pt reports doing well and feeling fine;    Patient is accompained by: Family member   Pertinent History Pt has a complex medical history including a recent stroke in Dec 2016, TIA in Sep 2017, and a L pontine stroke on 10/15/16. Pt saw his neurologist on 6/25 for worsening L sided weakness that he had been experiencing since 12/27/16 and was referred to OP imaging. The next day he went to the ED due to uncontrolled HTN an L sided weakness.  Communication was difficult due to pt being hard of hearing and speaking english as a second language. Pt is a poor historian with somewhat inconsistant answers.    Limitations Walking;Standing   How long can you sit comfortably? several hours   How long can you stand comfortably? 5 minutes   How long can you walk comfortably? about 10 ft   Diagnostic tests MR 6/27: IMPRESSION: No acute intracranial abnormality identified. Small chronic infarcts in basal ganglia, left paramedian pons,and right frontal subcortical white matter. Stable moderate chronic microvascular ischemic changes and mild parenchymal volume loss of the brain. Single interval punctate focus of microhemorrhage and left brachium pontis. Additional scattered stable chronic foci of microhemorrhage with central predominance, likely related to chronic hypertension.   Patient Stated Goals wants to be steady when walking, and for his R LE to work the same as his L LE   Currently in Pain? No/denies       PT TREATMENT   Nu Step; 3 min BUE and BLE lev 2 (unbilled)  Leg press, BLE plate 105# 8E42 with mod VCs to slow down LE movement for better strengthening; Leg press, RLE only 60# 2x15  Pt required mod VCs to slow down LE movement for better strengthening; especially with R LE for more eccentric control;  Parallel bars;   Mini lunge fwd and lateral x 15 each; mod cues for proper foot positioning and for keeping one leg straight as he lunges   On airex; tandem stance x20 sec  ea R/L forward On airex; tandem R/L and feet together; with head and trunk rotations x 5 ea holding yellow med ball; increase in sway with tandem stance  Cues to keep erect posture and to try and correct when he feels he is losing balance tandem walking on airex beam 4 x 5 feet; Tandem walking on airex beam 4 x 5 feet; with one UE for support and balance; when turning patient needed mod verbal cues and assist for safety;   Gait training; x 80 feet; gait with large quad cane; pt needed education with which hand to use cane with the base of quad cane in the way of feet; pt demonstrated a step to gait with R LE decreased step  length; Pt was verbally cued to take larger step with R LE; Language barrier may have affected understanding because pt decreased gait speed and increased step length to where his steps were to large; Pt then ambulated with out the quad cane and was able to ambulate with a step through gait pattern and increased speed with occasional shuffling when stopping gait to talk;                          PT Education - 02/03/17 1623    Education provided Yes   Education Details strengthening, balance, gait    Person(s) Educated Patient   Methods Explanation;Demonstration;Verbal cues   Comprehension Verbalized understanding;Returned demonstration;Verbal cues required             PT Long Term Goals - 01/22/17 1759      PT LONG TERM GOAL #1   Title Patient will be independent in home exercise program to improve strength/mobility for better functional independence with ADLs   Time 8   Period Weeks   Status New     PT LONG TERM GOAL #2   Title Patient will increase 10 meter walk test to >0.57ms as to improve gait speed for better community ambulation and to reduce fall risk   Baseline 0.423m at beginning of therapy with quad cane, 0.7113mfollowing therapy with RW  7/19    Time 8   Period Weeks   Status Partially Met     PT LONG TERM GOAL #3   Title Patient will increase gross BLE strength to 4+/5 in order to demonstrate safety with ADLs.     Baseline 4- grossly   Time 8   Period Weeks   Status New     PT LONG TERM GOAL #4   Title Patient will increase six minute walk test distance to >1000 for progression to community ambulator and improve gait ability   Baseline 550 on 7/17/118   Time 8   Period Weeks   Status New     PT LONG TERM GOAL #5   Title Patient (> 60 45ars old) will complete five times sit to stand test in < 15 seconds indicating an increased LE strength and improved balance.   Baseline 16.18 sec on 01/20/17   Time 8   Period Weeks   Status New                Plan - 02/03/17 1632    Clinical Impression Statement Pt was instructed in LE strengthening and balance exercises;  Pt's balance was challenged in tandem stance and with dynamic movment; Pt needed min verbal cues for erect posture and maintaing postural control with movement; Pt was able to perform strength exercises with min-mod  verbal cues in order for proper positioning and better muscle control; Pt would continue to benefit from skilled PT in order to improve gait safety, strength, and balance;    Rehab Potential Fair   Clinical Impairments Affecting Rehab Potential Positive: motivated, family support, assistive services at home; Negative: chronic issue including multiple strokes, previous falls resulting in injury   PT Frequency 2x / week   PT Duration 8 weeks   PT Treatment/Interventions ADLs/Self Care Home Management;Aquatic Therapy;Biofeedback;Cryotherapy;Electrical Stimulation;Moist Heat;DME Instruction;Gait training;Stair training;Functional mobility training;Therapeutic activities;Therapeutic exercise;Balance training;Neuromuscular re-education;Patient/family education;Manual techniques;Passive range of motion;Energy conservation;Canalith Repostioning;Cognitive remediation;Dry needling;Taping;Vestibular;Visual/perceptual remediation/compensation   PT Next Visit Plan Initiate HEP, 39mwalk test, further assess balance, dynamic stability, gait training, progress BLE strengthening   PT Home Exercise Plan Will initiate next session   Consulted and Agree with Plan of Care Patient      Patient will benefit from skilled therapeutic intervention in order to improve the following deficits and impairments:  Decreased activity tolerance, Decreased balance, Decreased endurance, Decreased mobility, Decreased strength, Difficulty walking, Hypomobility, Impaired flexibility, Improper body mechanics, Postural dysfunction, Abnormal gait, Cardiopulmonary status limiting activity,  Decreased coordination, Decreased cognition, Decreased knowledge of use of DME, Impaired vision/preception  Visit Diagnosis: Unsteadiness on feet  Muscle weakness (generalized)     Problem List Patient Active Problem List   Diagnosis Date Noted  . Hypertensive urgency 12/31/2016  . Stroke ([place] 10/15/2016  . TIA (transient ischemic attack) 03/07/2016  . Acute CVA (ce[place]brovascular accident) ([place] 07/03/2015   MDoreene NestSPT This entire session was performed under direct supervision and direction of a licensed tChiropractor. I have personally read, edited and approve of the note as written.  Trotter,Margaret PT, DPT 02/03/2017, 5:31 PM  CDelt[place]illeMAIN RSsm [facility] CenterSERVICES [address] [address]Phone: [phone]  Fax:  [phone] Name: IJosey ForcierMRN: [id number] of Birth: 906/25/1927

## 2017-02-04 ENCOUNTER — Ambulatory Visit: Payer: Medicare Other | Admitting: Physical Therapy

## 2017-02-05 ENCOUNTER — Encounter: Payer: Self-pay | Admitting: Physical Therapy

## 2017-02-05 ENCOUNTER — Ambulatory Visit: Payer: Medicare Other | Attending: Family Medicine | Admitting: Physical Therapy

## 2017-02-05 DIAGNOSIS — R269 Unspecified abnormalities of gait and mobility: Secondary | ICD-10-CM | POA: Insufficient documentation

## 2017-02-05 DIAGNOSIS — M6281 Muscle weakness (generalized): Secondary | ICD-10-CM | POA: Insufficient documentation

## 2017-02-05 DIAGNOSIS — R2681 Unsteadiness on feet: Secondary | ICD-10-CM | POA: Diagnosis not present

## 2017-02-05 NOTE — Patient Instructions (Addendum)
All 2 x 10 in each direction        Stand on right foot. Jump in P direction, landing on both feet. ___ reps, ___ sets, ___ times per day.  http://gglj.exer.us/80   Copyright  VHI. All rights reserved.  1:2 Medial    Stand on right foot. Jump in the M direction, facing forward, landing on both feet. ___ reps ___ sets ___ times per day.  http://gglj.exer.us/90   Copyright  VHI. All rights reserved.  1:2 Lateral    Stand on right foot. Jump in the L direction, facing forward, landing on both feet. ___ reps ___ sets ___ times per day.  http://gglj.exer.us/84   Copyright  VHI. All rights reserved.  1:2 Anterior    Stand on right foot. Jump in A direction, landing on both feet. ___ reps, ___ sets, ___ times per day.  http://gglj.exer.us/78   Copyright  VHI. All rights reserved.

## 2017-02-05 NOTE — Therapy (Signed)
[place] MAIN [facility] SERVICES [address] [place], [place], [address] Phone: [phone]   Fax:  [phone]  Physical Therapy Treatment  Patient Details  Name: Christopher [name] MRN: [id number] Date of Birth: 1925-07-26 Referring Provider: Tomasa Hose  Encounter Date: 02/05/2017      PT End of Session - 02/05/17 1556    Visit Number 6   Number of Visits 17   Date for PT Re-Evaluation 03/18/17   Authorization Type Gcode 6   Authorization Time Period 10   PT Start Time 1555   PT Stop Time 1646   PT Time Calculation (min) 51 min   Equipment Utilized During Treatment Gait belt   Activity Tolerance Patient tolerated treatment well;No increased pain   Behavior During Therapy WFL for tasks assessed/performed      Past Medical History:  Diagnosis Date  . Arthritis   . Ataxia   . Diabetes mellitus without complication ([place])    controlled, pt checks it in the morning   . Dyslipidemia   . Hypertension    somewhat controlled, pt checks every morning, he reports it has been good.  . Stroke ([place])   . TIA (transient ischemic attack)     Past Surgical History:  Procedure Laterality Date  . none      There were no vitals filed for this visit.      Subjective Assessment - 02/05/17 1557    Subjective Pt presents to therapy with son for translation; Pt reports doing well and feeling fine; he reports no recent falls.    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 Pt saw his neurologist on 6/25 for worsening L sided weakness that he had been experiencing since 12/27/16 and was referred to OP imaging. The next day he went to the ED due to uncontrolled HTN an L sided weakness.  Communication was difficult due to pt being hard of hearing and speaking english as a second language. Pt is a poor historian with  somewhat inconsistant answers.   Limitations Walking;Standing   How long can you sit comfortably? several hours   How long can you stand comfortably? 5 minutes   How long can you walk comfortably? about 10 ft   Diagnostic tests MR 6/27: IMPRESSION: No acute intracranial abnormality identified. Small chronic infarcts in basal ganglia, left paramedian pons,and right frontal subcortical white matter. Stable moderate chronic microvascular ischemic changes and mild parenchymal volume loss of the brain. Single interval punctate focus of microhemorrhage and left brachium pontis. Additional scattered stable chronic foci of microhemorrhage with central predominance, likely related to chronic hypertension.   Patient Stated Goals wants to be steady when walking, and for his R LE to work the same as his L LE   Currently in Pain? No/denies            [facility] PT Assessment - 02/05/17 0001      Standardized Balance Assessment   10 Meter Walk 0.56ms (limited community ambulator)        TREATMENT  There-ex: Nu Step; 4 min BUE and BLE lev 6 ; cued to keep steps above 60 for cardiovascular fitness; avg SPM was > 60; pt reports no chest pain and has no signs of cardiovascular distress.  Vitals assessed following NuStep in sitting on L UE: BP: 146/74 HR: 84bpm SpO2: 100%  Strengthening: Leg press, BLE plate 120# 4B44 with mod VCs to avoid complete knee extension to reduce impact on knees. Pt initially reported that the weight was too heavy, but was able to continue with min encouragement.  Leg press, RLE only 60# 2x15  Pt required mod VCs to slow down eccentric movement for better strengthening; especially with R LE for more control and muscle activation; pt also cued to sit back in seat to avoid Lx flexion; cued to continue breathing throughout exercise to avoid breath holding.   Balance exercise: Parallel bars;  Mini lunge fwd, back, and lateral 2 x 10 each; mod cues for proper foot positioning  and for keeping one leg straight as he lunges for better mechanics; and mod cues for larger step to improve step length for carryover to gait training; pt used BUE support with rails for first set -With second set pt held weighted ball to prevent him from using UE support for increased challenge to balance and motor control; 2# ankle weights added bilaterally. Pt responded poorly at first by sliding LEs back from lunge position, however with verbal and tactile cueing he was able to take larger steps and increase form and technique; technique was better with ankle weights and no UE support with increased quad activation for kick off.  Gait training; x 180 feet with no AD; pt initially with shuffle gait with step-to pattern; pt was verbally cued to walk faster for extrinsic cue to increase stride length; he responded very well with step through gait pattern and improved stability. Gait speed increased significantly.  80mwalk test conducted: 0.750m indicating pt is limited community ambulator, but nearing community ambulator.          PT Education - 02/05/17 1745    Education provided Yes   Education Details ther-ex, balance training, gait, HEP updated   Person(s) Educated Patient;Child(ren)   Methods Explanation;Demonstration;Tactile cues;Verbal cues;Handout   Comprehension Verbal cues required;Tactile cues required;Returned demonstration;Verbalized understanding             PT Long Term Goals - 02/05/17 1753      PT LONG TERM GOAL #1   Title Patient will be independent in home exercise program to improve strength/mobility for better functional independence with ADLs   Time 8   Period Weeks   Status New     PT LONG TERM GOAL #2   Title Patient will increase 10 meter walk test to >0.26m47mas to improve gait speed for better community ambulation and to reduce fall risk   Baseline 0.66m26mt beginning of therapy with quad cane, 0.79m/57mllowing therapy with RW  7/19. 0.44m/s77mAD  8/2   Time 8   Period Weeks   Status Partially Met     PT LONG TERM GOAL #3   Title Patient will increase gross BLE strength to 4+/5 in order to demonstrate safety with ADLs.     Baseline 4- grossly   Time 8   Period Weeks   Status New     PT LONG TERM GOAL #4   Title Patient will increase six minute walk test distance to >1000 for progression to community ambulator and improve gait ability   Baseline 550 on 7/17/118   Time 8   Period Weeks   Status New     PT LONG TERM GOAL #5   Title Patient (> 60 yea56 old) will complete five times sit to stand test in < 15 seconds indicating an increased LE strength and improved  balance.   Baseline 16.18 sec on 01/20/17   Time 8   Period Weeks   Status New               Plan - 02/05/17 1603    Clinical Impression Statement Pt led in ther-ex and balance training followed by re-assessment of gait; pt showed good carryover of balance and step training to gait with increased stride length and stability without AD. His 56mwalk time was his highest yet at 0.78m with no AD following step training. 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 Pt will benefit from continued skilled therapy in order to maximize safety and functional independence with mobility and ADLs/IADLs.     Rehab Potential Fair   Clinical Impairments Affecting Rehab Potential Positive: motivated, family support, assistive services at home; Negative: chronic issue including multiple strokes, previous falls resulting in injury   PT Frequency 2x / week   PT Duration 8 weeks   PT Treatment/Interventions ADLs/Self Care Home Management;Aquatic Therapy;Biofeedback;Cryotherapy;Electrical Stimulation;Moist Heat;DME Instruction;Gait training;Stair training;Functional mobility training;Therapeutic activities;Therapeutic exercise;Balance training;Neuromuscular re-education;Patient/family education;Manual  techniques;Passive range of motion;Energy conservation;Canalith Repostioning;Cognitive remediation;Dry needling;Taping;Vestibular;Visual/perceptual remediation/compensation   PT Next Visit Plan Initiate HEP, 1091mlk test, further assess balance, dynamic stability, gait training, progress BLE strengthening   PT Home Exercise Plan Will initiate next session   Consulted and Agree with Plan of Care Patient      Patient will benefit from skilled therapeutic intervention in order to improve the following deficits and impairments:  Decreased activity tolerance, Decreased balance, Decreased endurance, Decreased mobility, Decreased strength, Difficulty walking, Hypomobility, Impaired flexibility, Improper body mechanics, Postural dysfunction, Abnormal gait, Cardiopulmonary status limiting activity, Decreased coordination, Decreased cognition, Decreased knowledge of use of DME, Impaired vision/preception  Visit Diagnosis: Unsteadiness on feet  Muscle weakness (generalized)     Problem List Patient Active Problem List   Diagnosis Date Noted  . Hypertensive urgency 12/31/2016  . [place] ([place]  . TIA (transient ischemic attack) 03/07/2016  . Acute CVA (cerebrovascular accident) ([place]/27/2016   Abra Lingenfelter M GLenis DickinsonPT This entire session was performed under direct supervision and direction of a licensed therapist/therapist assistant . I have personally read, edited and approve of the note as written.  Trotter,Margaret PT, DPT 02/05/2017, 6:10 PM  ConSauk VillageIN REHUhs [facility] HospitalRVICES [address] [place]one: [phone]:  [phone]: IqbOsie AmparoN: [id number] of Birth: [birth year]

## 2017-02-09 ENCOUNTER — Ambulatory Visit: Payer: Medicare Other | Admitting: Physical Therapy

## 2017-02-10 ENCOUNTER — Encounter: Payer: Self-pay | Admitting: Physical Therapy

## 2017-02-10 ENCOUNTER — Ambulatory Visit: Payer: Medicare Other | Admitting: Physical Therapy

## 2017-02-10 VITALS — BP 138/65

## 2017-02-10 DIAGNOSIS — R2681 Unsteadiness on feet: Secondary | ICD-10-CM | POA: Diagnosis not present

## 2017-02-10 DIAGNOSIS — M6281 Muscle weakness (generalized): Secondary | ICD-10-CM

## 2017-02-10 NOTE — Therapy (Signed)
[place] MAIN [facility] SERVICES [address] [place], [place], [address] Phone: [phone]   Fax:  [phone]  Physical Therapy Treatment  Patient Details  Name: Christopher [name] MRN: [id number] Date of Birth: [birth year] Referring Provider: Tomasa Hose  Encounter Date: 02/10/2017      PT End of Session - 02/10/17 1610    Visit Number 7   Number of Visits 17   Date for PT Re-Evaluation 03/18/17   Authorization Type Gcode 7   Authorization Time Period 10   PT Start Time 1600   PT Stop Time 1650   PT Time Calculation (min) 50 min   Equipment Utilized During Treatment Gait belt   Activity Tolerance Patient tolerated treatment well;No increased pain   Behavior During Therapy WFL for tasks assessed/performed      Past Medical History:  Diagnosis Date  . Arthritis   . Ataxia   . Diabetes mellitus without complication ([place])    controlled, pt checks it in the morning   . Dyslipidemia   . Hypertension    somewhat controlled, pt checks every morning, he reports it has been good.  . Stroke ([place])   . TIA (transient ischemic attack)     Past Surgical History:  Procedure Laterality Date  . none      Vitals:   02/10/17 1607  BP: 138/65        Subjective Assessment - 02/10/17 1609    Subjective Pt reports doing well; no falls; pt has been doing many exercises at home when aide is there;    Patient is accompained by: Family member   Pertinent History Pt has a complex medical history including a recent stroke in Dec 2016, TIA in Sep 2017, and a L pontine stroke on 10/15/16. Pt saw his neurologist on 6/25 for worsening L sided weakness that he had been experiencing since 12/27/16 and was referred to OP imaging. The next day he went to the ED due to uncontrolled HTN an L sided weakness.  Communication was difficult due to pt being hard of hearing and speaking english as a second language. Pt is a poor historian with somewhat inconsistant answers.    Limitations Walking;Standing   How long can you sit comfortably? several hours   How long can you stand comfortably? 5 minutes   How long can you walk comfortably? about 10 ft   Diagnostic tests MR 6/27: IMPRESSION: No acute intracranial abnormality identified. Small chronic infarcts in basal ganglia, left paramedian pons,and right frontal subcortical white matter. Stable moderate chronic microvascular ischemic changes and mild parenchymal volume loss of the brain. Single interval punctate focus of microhemorrhage and left brachium pontis. Additional scattered stable chronic foci of microhemorrhage with central predominance, likely related to chronic hypertension.   Patient Stated Goals wants to be steady when walking, and for his R LE to work the same as his L LE   Currently in Pain? No/denies      PT TREATMENT;  Vitals taken(see above);  Nu step x 3 min lev 4 steps per min >60;(unbilled)   Leg press, BLE plate 120# 2T55 Leg press, RLE only 75# 2x15  Pt required mod VCs to slow down LE movement for better strengthening; especially with R LE for more eccentric control; Pt also cued to prevent legs from going into extension   Diagonal Side Stepping; with green tband  10 feet x 3; Pt verbally cued to take larger steps in order for better muscle activation;  1/2  foam roller rocks fwd and back x 10 ea feet apart 3 x 30 sec; pt demonstrated min sway but was able to correct and not lose balance Feet apart with UE flexion with PVC pipe; 1 x 20 sec; increased sway and pt lost balance posteriorly and if pt correct would lose balance forward;  Balloon taps with feet together on airex; 2 x 15; Pt had more LOB when reaching outside BOS  Gat training on treadmill; no AD 2:30mn x 2 at .852m; pt cued to take larger steps and increase single leg stance time on each leg; Pt was able to correct stepping and the cue to step toward top of treadmill was beneficial;  x80 feet with no AD around the  gym; pt was able to carry-over step through pattern on treadmill with limited verbal cues to take larger steps;   Pt tolerated treatment well; assist from son with translation and understanding throughout treatment                         PT Education - 02/10/17 1610    Education provided Yes   Education Details strengthening, gait, balance;   Person(s) Educated Patient   Methods Explanation;Verbal cues   Comprehension Verbalized understanding;Returned demonstration;Verbal cues required             PT Long Term Goals - 02/05/17 1753      PT LONG TERM GOAL #1   Title Patient will be independent in home exercise program to improve strength/mobility for better functional independence with ADLs   Time 8   Period Weeks   Status New     PT LONG TERM GOAL #2   Title Patient will increase 10 meter walk test to >0.75m1mas to improve gait speed for better community ambulation and to reduce fall risk   Baseline 0.17m60mt beginning of therapy with quad cane, 0.21m/44mllowing therapy with RW  7/19. 0.22m/s62mAD 8/2   Time 8   Period Weeks   Status Partially Met     PT LONG TERM GOAL #3   Title Patient will increase gross BLE strength to 4+/5 in order to demonstrate safety with ADLs.     Baseline 4- grossly   Time 8   Period Weeks   Status New     PT LONG TERM GOAL #4   Title Patient will increase six minute walk test distance to >1000 for progression to community ambulator and improve gait ability   Baseline 550 on 7/17/118   Time 8   Period Weeks   Status New     PT LONG TERM GOAL #5   Title Patient (> 60 yea48 old) will complete five times sit to stand test in < 15 seconds indicating an increased LE strength and improved balance.   Baseline 16.18 sec on 01/20/17   Time 8   Period Weeks   Status New               Plan - 02/10/17 1611    Clinical Impression Statement Pt instructed in LE strengthening and balance exercises; Pt is increasing  strength in R LE and was able to increase weight in the single leg leg press; Pt also progressed and exhibits more control with side stepping and lunges; Pt balance on the 1/2 foam roller had min sway and when adding UE movement provided a lot of challenge to pt with increase in LOB; Pt gait on treadmill with cues for  step length improved greatly and when walking in gym showed good carryover; Pt will continue to benefit from skilled PT to improve gait, bakance and strengthening;    Rehab Potential Fair   Clinical Impairments Affecting Rehab Potential Positive: motivated, family support, assistive services at home; Negative: chronic issue including multiple strokes, previous falls resulting in injury   PT Frequency 2x / week   PT Duration 8 weeks   PT Treatment/Interventions ADLs/Self Care Home Management;Aquatic Therapy;Biofeedback;Cryotherapy;Electrical Stimulation;Moist Heat;DME Instruction;Gait training;Stair training;Functional mobility training;Therapeutic activities;Therapeutic exercise;Balance training;Neuromuscular re-education;Patient/family education;Manual techniques;Passive range of motion;Energy conservation;Canalith Repostioning;Cognitive remediation;Dry needling;Taping;Vestibular;Visual/perceptual remediation/compensation   PT Next Visit Plan Initiate HEP, 34mwalk test, further assess balance, dynamic stability, gait training, progress BLE strengthening   PT Home Exercise Plan Will initiate next session   Consulted and Agree with Plan of Care Patient      Patient will benefit from skilled therapeutic intervention in order to improve the following deficits and impairments:  Decreased activity tolerance, Decreased balance, Decreased endurance, Decreased mobility, Decreased strength, Difficulty walking, Hypomobility, Impaired flexibility, Improper body mechanics, Postural dysfunction, Abnormal gait, Cardiopulmonary status limiting activity, Decreased coordination, Decreased cognition,  Decreased knowledge of use of DME, Impaired vision/preception  Visit Diagnosis: Unsteadiness on feet  Muscle weakness (generalized)     Problem List Patient Active Problem List   Diagnosis Date Noted  . Hypertensive urgency 12/31/2016  . Stroke ([place] 10/15/2016  . TIA (transient ischemic attack) 03/07/2016  . Acute CVA (cerebrovascular accident) ([place] 07/03/2015   MDoreene NestSPT This entire session was performed under direct supervision and direction of a licensed tChiropractor. I have personally read, edited and approve of the note as written.  Trotter,Margaret PT, DPT 02/10/2017, 5:20 PM  CPleasant HillMAIN RHolland Community HospitalSERVICES [address] [address]Phone: [phone]  Fax:  [phone] Name: ICorgan MormileMRN: [id number] of Birth: [birth year]

## 2017-02-11 ENCOUNTER — Ambulatory Visit: Payer: Medicare Other | Admitting: Physical Therapy

## 2017-02-12 ENCOUNTER — Ambulatory Visit: Payer: Medicare Other

## 2017-02-12 ENCOUNTER — Encounter: Payer: Self-pay | Admitting: Physical Therapy

## 2017-02-12 VITALS — BP 132/65

## 2017-02-12 DIAGNOSIS — R269 Unspecified abnormalities of gait and mobility: Secondary | ICD-10-CM

## 2017-02-12 DIAGNOSIS — R2681 Unsteadiness on feet: Secondary | ICD-10-CM | POA: Diagnosis not present

## 2017-02-12 DIAGNOSIS — M6281 Muscle weakness (generalized): Secondary | ICD-10-CM

## 2017-02-12 NOTE — Therapy (Signed)
[place] [facility] MAIN [facility] SERVICES [address] [place], [place], [address] Phone: [phone]   Fax:  [phone]  Physical Therapy Treatment  Patient Details  Name: Christopher [name] MRN: [id number] Date of Birth: 1925-09-19 Referring Provider: Karie Fetch  Encounter Date: 02/12/2017      PT End of Session - 02/12/17 1557    Visit Number 8   Number of Visits 17   Date for PT Re-Evaluation 03/18/17   Authorization Type Gcode 8   Authorization Time Period 10   PT Start Time 1615   PT Stop Time 1700   PT Time Calculation (min) 45 min   Equipment Utilized During Treatment Gait belt   Activity Tolerance Patient tolerated treatment well;No increased pain   Behavior During Therapy WFL for tasks assessed/performed      Past Medical History:  Diagnosis Date  . Arthritis   . Ataxia   . Diabetes mellitus without complication (HCC)    controlled, pt checks it in the morning   . Dyslipidemia   . Hypertension    somewhat controlled, pt checks every morning, he reports it has been good.  . Stroke (HCC)   . TIA (transient ischemic attack)     Past Surgical History:  Procedure Laterality Date  . none      Vitals:   02/12/17 1556  BP: 132/65        Subjective Assessment - 02/12/17 1618    Subjective Pt presents to therapy with son for translation; Pt denies pain and states he is fine;   Patient is accompained by: Family member   Pertinent History Pt has a complex medical history including a recent stroke in Dec 2016, TIA in Sep 2017, and a L pontine stroke on 10/15/16. Pt saw his neurologist on 6/25 for worsening L sided weakness that he had been experiencing since 12/27/16 and was referred to OP imaging. The next day he went to the ED due to uncontrolled HTN an L sided weakness.  Communication was difficult due to pt being hard of hearing and speaking english as a second language. Pt is a poor historian with somewhat inconsistant answers.    Limitations Walking;Standing   How long can you sit comfortably? several hours   How long can you stand comfortably? 5 minutes   How long can you walk comfortably? about 10 ft   Diagnostic tests MR 6/27: IMPRESSION: No acute intracranial abnormality identified. Small chronic infarcts in basal ganglia, left paramedian pons,and right frontal subcortical white matter. Stable moderate chronic microvascular ischemic changes and mild parenchymal volume loss of the brain. Single interval punctate focus of microhemorrhage and left brachium pontis. Additional scattered stable chronic foci of microhemorrhage with central predominance, likely related to chronic hypertension.   Patient Stated Goals wants to be steady when walking, and for his R LE to work the same as his L LE   Currently in Pain? No/denies   Multiple Pain Sites No        PT TREATMENT  Vitals (see above)   Nu Step x 3 min BLE and BUE (unbilled)   Leg Press;  L LE 2 x15 75#  R LE 2 x15 75#  Pt verbal and tactile cues for LE position and to prevent hyperextension and to slowly control movement;   //bars;  Diagonal side stepping; 4 x 10 feet with green tband; to increase hip abductor strength   Step ups over hurdle fwd onto airex and box alt LE; x 10 ea  Step ups lateral over hurdle onto airex and box alt LE x 10 ea  Step overs to promote increased step length; Pt challenged in balance and control with min sway and no UE;   LAQ; 2 x 10 with 4# ankle weight  Marches; 2 x 10 with 4# ankle weight Pt performed strengthening with full ROM;    Gait trainer on treadmill x 12 min; Pt demonstrated a shuffle gait pattern; Gait trainer biofeedback was telling pt to take larger steps with both feet; Pt difficulty walking and looking at screen; PT then cued with pball at top of treadmill for pt to kick the ball; Pt was able to take larger steps with cue but not consistent  Pt tolerated treatment well;                            PT Education - 02/12/17 1557    Education provided Yes   Education Details strengthening, gait and balance   Person(s) Educated Patient   Methods Explanation;Verbal cues;Tactile cues   Comprehension Verbalized understanding;Returned demonstration;Verbal cues required             PT Long Term Goals - 02/05/17 1753      PT LONG TERM GOAL #1   Title Patient will be independent in home exercise program to improve strength/mobility for better functional independence with ADLs   Time 8   Period Weeks   Status New     PT LONG TERM GOAL #2   Title Patient will increase 10 meter walk test to >0.27ms as to improve gait speed for better community ambulation and to reduce fall risk   Baseline 0.450m at beginning of therapy with quad cane, 0.7126mfollowing therapy with RW  7/19. 0.74m10mo AD 8/2   Time 8   Period Weeks   Status Partially Met     PT LONG TERM GOAL #3   Title Patient will increase gross BLE strength to 4+/5 in order to demonstrate safety with ADLs.     Baseline 4- grossly   Time 8   Period Weeks   Status New     PT LONG TERM GOAL #4   Title Patient will increase six minute walk test distance to >1000 for progression to community ambulator and improve gait ability   Baseline 550 on 7/17/118   Time 8   Period Weeks   Status New     PT LONG TERM GOAL #5   Title Patient (> 60 y10rs old) will complete five times sit to stand test in < 15 seconds indicating an increased LE strength and improved balance.   Baseline 16.18 sec on 01/20/17   Time 8   Period Weeks   Status New               Plan - 02/12/17 1557    Clinical Impression Statement Pt instructed in LE strengthening, balance and gait exercises; Pt's strength has improved and was able to complete the single leg leg press at 75# with ea leg; Pt still requires cues for positioning of exercises and technique to perform correctly with good LE activation;  During gait training PT tried biofeedback in order to increase step length bilat. Pt was unable to follow the screen to follow the steps and verbal cues were not enough; Having a visual cue at his feet for how far to step seemed more effective for pt; Pt was not very consistent and reverted to shuffling  but understood the task;  Pt will continue to benefit from skilled Pt in order to improve balance, strength, and gait safety;    Rehab Potential Fair   Clinical Impairments Affecting Rehab Potential Positive: motivated, family support, assistive services at home; Negative: chronic issue including multiple strokes, previous falls resulting in injury   PT Frequency 2x / week   PT Duration 8 weeks   PT Treatment/Interventions ADLs/Self Care Home Management;Aquatic Therapy;Biofeedback;Cryotherapy;Electrical Stimulation;Moist Heat;DME Instruction;Gait training;Stair training;Functional mobility training;Therapeutic activities;Therapeutic exercise;Balance training;Neuromuscular re-education;Patient/family education;Manual techniques;Passive range of motion;Energy conservation;Canalith Repostioning;Cognitive remediation;Dry needling;Taping;Vestibular;Visual/perceptual remediation/compensation   PT Next Visit Plan Initiate HEP, 20mwalk test, further assess balance, dynamic stability, gait training, progress BLE strengthening   PT Home Exercise Plan Will initiate next session   Consulted and Agree with Plan of Care Patient      Patient will benefit from skilled therapeutic intervention in order to improve the following deficits and impairments:  Decreased activity tolerance, Decreased balance, Decreased endurance, Decreased mobility, Decreased strength, Difficulty walking, Hypomobility, Impaired flexibility, Improper body mechanics, Postural dysfunction, Abnormal gait, Cardiopulmonary status limiting activity, Decreased coordination, Decreased cognition, Decreased knowledge of use of DME, Impaired  vision/preception  Visit Diagnosis: Unsteadiness on feet  Muscle weakness (generalized)  Abnormality of gait     Problem List Patient Active Problem List   Diagnosis Date Noted  . Hypertensive urgency 12/31/2016  . [place] ([place] 10/15/2016  . TIA (transient ischemic attack) 03/07/2016  . Acute CVA (cerebrovascular accident) ([place] 07/03/2015   This entire session was performed under direct supervision and direction of a licensed therapist/therapist assistant . I have personally read, edited and approve of the note as written.   Brittney Caraway SPT JPhillips GroutPT, DPT   Huprich,Jason 02/12/2017, 5:28 PM  CStarr SchoolMAIN RSi[facility]PSERVICES [address] [address]Phone: [phone]  Fax:  [phone] Name: Christopher PetiteMRN: [id number] of Birth: [birth year]

## 2017-02-17 ENCOUNTER — Ambulatory Visit: Payer: Medicare Other | Admitting: Physical Therapy

## 2017-02-17 ENCOUNTER — Encounter: Payer: Self-pay | Admitting: Physical Therapy

## 2017-02-17 VITALS — BP 137/65

## 2017-02-17 DIAGNOSIS — R269 Unspecified abnormalities of gait and mobility: Secondary | ICD-10-CM

## 2017-02-17 DIAGNOSIS — R2681 Unsteadiness on feet: Secondary | ICD-10-CM

## 2017-02-17 DIAGNOSIS — M6281 Muscle weakness (generalized): Secondary | ICD-10-CM

## 2017-02-17 NOTE — Therapy (Signed)
[place] MAIN [facility] SERVICES [address] [place], [place], [address] Phone: [phone]   Fax:  [phone]  Physical Therapy Treatment  Patient Details  Name: Christopher [name] MRN: [id number] Date of Birth: 1925/08/15 Referring Provider: Tomasa Hose  Encounter Date: 02/17/2017      PT End of Session - 02/17/17 1639    Visit Number 9   Number of Visits 17   Date for PT Re-Evaluation 03/18/17   Authorization Type Gcode 9   Authorization Time Period 10   PT Start Time 9211   PT Stop Time 9417   PT Time Calculation (min) 38 min   Equipment Utilized During Treatment Gait belt   Activity Tolerance Patient tolerated treatment well;No increased pain   Behavior During Therapy WFL for tasks assessed/performed      Past Medical History:  Diagnosis Date  . Arthritis   . Ataxia   . Diabetes mellitus without complication ([place])    controlled, pt checks it in the morning   . Dyslipidemia   . Hypertension    somewhat controlled, pt checks every morning, he reports it has been good.  . Stroke ([place])   . TIA (transient ischemic attack)     Past Surgical History:  Procedure Laterality Date  . none      Vitals:   02/17/17 1612  BP: 137/65        Subjective Assessment - 02/17/17 1608    Subjective Pt reports doing fine today; Pt presents to therapy with son for translation; Son reports that when aide comes he walks with her but today he was feeling extremely weak;   Patient is accompained by: Family member   Pertinent History Pt has a complex medical history including a recent stroke in Dec 2016, TIA in Sep 2017, and a L pontine stroke on 10/15/16. Pt saw his neurologist on 6/25 for worsening L sided weakness that he had been experiencing since 12/27/16 and was referred to OP imaging. The next day he went to the ED due to uncontrolled HTN an L sided weakness.  Communication was difficult due to pt being hard of hearing and speaking english as a  second language. Pt is a poor historian with somewhat inconsistant answers.   Limitations Walking;Standing   How long can you sit comfortably? several hours   How long can you stand comfortably? 5 minutes   How long can you walk comfortably? about 10 ft   Diagnostic tests MR 6/27: IMPRESSION: No acute intracranial abnormality identified. Small chronic infarcts in basal ganglia, left paramedian pons,and right frontal subcortical white matter. Stable moderate chronic microvascular ischemic changes and mild parenchymal volume loss of the brain. Single interval punctate focus of microhemorrhage and left brachium pontis. Additional scattered stable chronic foci of microhemorrhage with central predominance, likely related to chronic hypertension.   Patient Stated Goals wants to be steady when walking, and for his R LE to work the same as his L LE   Currently in Pain? No/denies   Pain Score 0-No pain   Multiple Pain Sites No     PT TREATMENT;   Vitals (see above)   //bars;  Standing; with 3# ankle weight  Hip abduction R/L x 15  Hip flexion marches x 15 ea  Hip Extension R/L x 15  Pt demonstrated good ROM and muscle activation with exercises   Step ups over hurdle fwd onto airex and box alt LE; x 10 ea  Step ups lateral over hurdle onto airex  and box alt LE x 10 ea  Pt challenged in balance and control with min sway and no UE   R/L single leg stance moving soccer ball under opposite LE with one UE for support; 2 x 30 sec ea  Pt required min verbal cues for postural control and to stay erect;   Lunge fwd holding 2# med ball 2 x 10  Lunge to side holding 2# med ball 2 x 10  Pt cued for better foot positioning and larger step length; Pt weaker and less balance control on R and couldn't push back to starting position and took mini steps to get back;   Leg Press  R LE 75# x 15  L LE 75# x 15  Pt verbal and tactile cues for LE position and to prevent hyperextension and to slowly control  movement;   Gait training with no AD 8 min down a straight path; Pt required only one initial cue to increase step length and imagine kicking the pball from last week and was able to show a reciprocal gait pattern with large step lengths; Pt only had one LOB when in R single leg stance phase;   Pt tolerated treatment well;                              PT Education - 02/17/17 1637    Education provided Yes   Education Details strengthening, gait, balance   Person(s) Educated Patient   Methods Explanation;Verbal cues;Tactile cues;Demonstration   Comprehension Verbalized understanding;Returned demonstration;Verbal cues required             PT Long Term Goals - 02/05/17 1753      PT LONG TERM GOAL #1   Title Patient will be independent in home exercise program to improve strength/mobility for better functional independence with ADLs   Time 8   Period Weeks   Status New     PT LONG TERM GOAL #2   Title Patient will increase 10 meter walk test to >0.59ms as to improve gait speed for better community ambulation and to reduce fall risk   Baseline 0.42m at beginning of therapy with quad cane, 0.7118mfollowing therapy with RW  7/19. 0.36m19mo AD 8/2   Time 8   Period Weeks   Status Partially Met     PT LONG TERM GOAL #3   Title Patient will increase gross BLE strength to 4+/5 in order to demonstrate safety with ADLs.     Baseline 4- grossly   Time 8   Period Weeks   Status New     PT LONG TERM GOAL #4   Title Patient will increase six minute walk test distance to >1000 for progression to community ambulator and improve gait ability   Baseline 550 on 7/17/118   Time 8   Period Weeks   Status New     PT LONG TERM GOAL #5   Title Patient (> 60 y27rs old) will complete five times sit to stand test in < 15 seconds indicating an increased LE strength and improved balance.   Baseline 16.18 sec on 01/20/17   Time 8   Period Weeks   Status New                Plan - 02/17/17 1647    Clinical Impression Statement Pt instructed in LE, balance, and gait exercises; Despite patient feeling weak, pt was able to complete the leg press  at the same weight as before and requiring min cues for technique; Pt performed lunges holding ball allowing for no UE support; Pt wasnt able to take as large a step with the lunge holding the ball and had min sway when lunging with the R foot; Last session patient did gait with the gait trainer setting and with a pball to cue large steps; Today with only one initial cue for large steps patient showed improvement and carryover; Pt would coninue to benefit from skilled PT in order to improve balance, strength, and gait safety;    Rehab Potential Fair   Clinical Impairments Affecting Rehab Potential Positive: motivated, family support, assistive services at home; Negative: chronic issue including multiple strokes, previous falls resulting in injury   PT Frequency 2x / week   PT Duration 8 weeks   PT Treatment/Interventions ADLs/Self Care Home Management;Aquatic Therapy;Biofeedback;Cryotherapy;Electrical Stimulation;Moist Heat;DME Instruction;Gait training;Stair training;Functional mobility training;Therapeutic activities;Therapeutic exercise;Balance training;Neuromuscular re-education;Patient/family education;Manual techniques;Passive range of motion;Energy conservation;Canalith Repostioning;Cognitive remediation;Dry needling;Taping;Vestibular;Visual/perceptual remediation/compensation   PT Next Visit Plan Initiate HEP, 77mwalk test, further assess balance, dynamic stability, gait training, progress BLE strengthening   PT Home Exercise Plan Will initiate next session   Consulted and Agree with Plan of Care Patient      Patient will benefit from skilled therapeutic intervention in order to improve the following deficits and impairments:  Decreased activity tolerance, Decreased balance, Decreased endurance,  Decreased mobility, Decreased strength, Difficulty walking, Hypomobility, Impaired flexibility, Improper body mechanics, Postural dysfunction, Abnormal gait, Cardiopulmonary status limiting activity, Decreased coordination, Decreased cognition, Decreased knowledge of use of DME, Impaired vision/preception  Visit Diagnosis: Unsteadiness on feet  Muscle weakness (generalized)  Abnormality of gait     Problem List Patient Active Problem List   Diagnosis Date Noted  . Hypertensive urgency 12/31/2016  . Stroke ([place] 10/15/2016  . TIA (transient ischemic attack) 03/07/2016  . Acute CVA (ce[place]brovascular accident) ([place] 07/03/2015   MDoreene NestSPT This entire session was performed under direct supervision and direction of a licensed tChiropractor. I have personally read, edited and approve of the note as written.  Trotter,Margaret PT, DPT 02/17/2017, 5:45 PM  CHelenaMAIN RSan[facility]trSERVICES [address]RLac[address] [place] [address]Phone: [phone]  Fax:  [phone] Name: IUlmer DegenMRN: [id number] of Birth: 9February 17, 1927

## 2017-02-19 ENCOUNTER — Ambulatory Visit: Payer: Medicare Other | Admitting: Physical Therapy

## 2017-02-19 ENCOUNTER — Encounter: Payer: Self-pay | Admitting: Physical Therapy

## 2017-02-19 VITALS — BP 126/68 | HR 72

## 2017-02-19 DIAGNOSIS — M6281 Muscle weakness (generalized): Secondary | ICD-10-CM

## 2017-02-19 DIAGNOSIS — R2681 Unsteadiness on feet: Secondary | ICD-10-CM

## 2017-02-19 DIAGNOSIS — R269 Unspecified abnormalities of gait and mobility: Secondary | ICD-10-CM

## 2017-02-19 NOTE — Therapy (Signed)
[place] MAIN [facility] SERVICES [address] [place], [place], [address] Phone: [phone]   Fax:  [phone]  Physical Therapy Treatment/Progress Note  Patient Details  Name: Christopher [name] MRN: [id number] Date of Birth: 1925-10-06 Referring Provider: Tomasa Hose  Encounter Date: 02/19/2017      PT End of Session - 02/19/17 1647    Visit Number 10   Number of Visits 17   Date for PT Re-Evaluation 03/18/17   Authorization Type Gcode 10   Authorization Time Period 10   PT Start Time 1600   PT Stop Time 1649   PT Time Calculation (min) 49 min   Equipment Utilized During Treatment Gait belt   Activity Tolerance Patient tolerated treatment well;No increased pain   Behavior During Therapy WFL for tasks assessed/performed      Past Medical History:  Diagnosis Date  . Arthritis   . Ataxia   . Diabetes mellitus without complication ([place])    controlled, pt checks it in the morning   . Dyslipidemia   . Hypertension    somewhat controlled, pt checks every morning, he reports it has been good.  . Stroke ([place])   . TIA (transient ischemic attack)     Past Surgical History:  Procedure Laterality Date  . none      Vitals:   02/19/17 1606  BP: 126/68  Pulse: 72  SpO2: 100%        Subjective Assessment - 02/19/17 1606    Subjective Pt reports he is doing well today with no change since last visit; his son agrees. No falls and no pain at this time.   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 Pt saw his neurologist on 6/25 for worsening L sided weakness that he had been experiencing since 12/27/16 and was referred to OP imaging. The next day he went to the ED due to uncontrolled HTN an L sided weakness.  Communication was difficult due to pt being hard of hearing and speaking english as a second  language. Pt is a poor historian with somewhat inconsistant answers.   Limitations Walking;Standing   How long can you sit comfortably? several hours   How long can you stand comfortably? 5 minutes   How long can you walk comfortably? about 10 ft   Diagnostic tests MR 6/27: IMPRESSION: No acute intracranial abnormality identified. Small chronic infarcts in basal ganglia, left paramedian pons,and right frontal subcortical white matter. Stable moderate chronic microvascular ischemic changes and mild parenchymal volume loss of the brain. Single interval punctate focus of microhemorrhage and left brachium pontis. Additional scattered stable chronic foci of microhemorrhage with central predominance, likely related to chronic hypertension.   Patient Stated Goals wants to be steady when walking, and for his R LE to work the same as his L LE         Treatment:  Ther-ex:    NuStep x 4 min at level 6 resistance (high resistance) cues to keep SPM above 70 for cardiovascular response. Avg SPM = 66. Pt reports it is hard today.  Vitals assessed before exercise, see above. Vital post exercise BP: 153/67, pulse 79, SpO2 99%  Neuro re-education:  Standing in parallel bars     with 4# ankle weight    Hip abduction R/L x 15    Hip flexion straight leg  x 15 ea    Hip Extension R/L x 10 Pt demonstrated good ROM and muscle activation with exercises with min cues for isolation of movement and to avoid compensation.   Goals re-assessed:   22mwalk speed 0.939m with no AD indicating pt is a community level ambulator and at reduced fall risk; pt continues to drift and show signes of instability requiring CGA for safety at this gait speed.    Strength: 4+ to 5/5 grossly; pt shows functional signs of weakness, such as foot drop and decreased hip flexion with prolonged ambulation indicating pt has impaired endurance and coordination.   5x sit<>stand: 11.28 sec (no HHA on chair) indicating pt is low fall  risk for his age (>(>57/o with time <15 sec); pt had minor instability at this speed requiring several postural corrections to prevent LOB and SBA for safety.   73m67mwalk test: 995' increased from 550 on 7/17/118; pt had progressively worsening shuffle gait, foot drop, and decreased hip flexion causing hip to stumble repeatedly requiring SBA during test and minA x 1 for LOB at end of test.         PT Long Term Goals - 02/19/17 1625      PT LONG TERM GOAL #1   Title Patient will be independent in home exercise program to improve strength/mobility for better functional independence with ADLs   Baseline Pt is adherent to HEP   Time 8   Period Weeks   Status Partially Met     PT LONG TERM GOAL #2   Title Patient will increase 10 meter walk test to >0.13m/29ms to improve gait speed for better community ambulation and to reduce fall risk   Baseline 0.62m/52m beginning of therapy with quad cane, 0.37m/s41mlowing therapy with RW  7/19. 0.773m/s 82mD 8/2, 0.47m/s 8873mno AD   Time 8   Period Weeks   Status Achieved     PT LONG TERM GOAL #3   Title Patient will increase gross BLE strength to 4+/5 in order to demonstrate safety with ADLs.     Baseline 4+ to 5/5 grossly   Time 8   Period Weeks   Status Achieved     PT LONG TERM GOAL #4   Title Patient will increase six minute walk test distance to >1000 for progression to community ambulator and improve gait ability   Baseline 550 on 7/17/118; 955' on 8/16   Time 8   Period Weeks   Status Partially Met     PT LONG TERM GOAL #5   Title Patient (> 60 years33ld) will complete five times sit to stand test in < 15 seconds indicating an increased LE strength and improved balance.   Baseline 16.18 sec on 01/20/17; 11.28 sec on 8/16   Time 8   Period Weeks   Status Achieved               Plan - 02/19/17 1703    Clinical Impression Statement Pt re-assessed today with great progress towards his goals. 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 His strength is WFL in all major LE muscle groups. Pt does still show signs of being a fall risk due to progressive shuffle gait that becomes worse with fatigue. Pt required minA to prevent fall following 73m walk te24mtoday. He will benefit from  continued skilled therapy to improve his balance and coordination to maximize his safety and functional mobility.   Rehab Potential Fair   Clinical Impairments Affecting Rehab Potential Positive: motivated, family support, assistive services at home; Negative: chronic issue including multiple strokes, previous falls resulting in injury   PT Frequency 2x / week   PT Duration 8 weeks   PT Treatment/Interventions ADLs/Self Care Home Management;Aquatic Therapy;Biofeedback;Cryotherapy;Electrical Stimulation;Moist Heat;DME Instruction;Gait training;Stair training;Functional mobility training;Therapeutic activities;Therapeutic exercise;Balance training;Neuromuscular re-education;Patient/family education;Manual techniques;Passive range of motion;Energy conservation;Canalith Repostioning;Cognitive remediation;Dry needling;Taping;Vestibular;Visual/perceptual remediation/compensation   PT Next Visit Plan Initiate HEP, 47mwalk test, further assess balance, dynamic stability, gait training, progress BLE strengthening   PT Home Exercise Plan Will initiate next session   Consulted and Agree with Plan of Care Patient      Patient will benefit from skilled therapeutic intervention in order to improve the following deficits and impairments:  Decreased activity tolerance, Decreased balance, Decreased endurance, Decreased mobility, Decreased strength, Difficulty walking, Hypomobility, Impaired flexibility, Improper body mechanics, Postural dysfunction, Abnormal gait, Cardiopulmonary status limiting activity, Decreased coordination, Decreased  cognition, Decreased knowledge of use of DME, Impaired vision/preception  Visit Diagnosis: Unsteadiness on feet  Muscle weakness (generalized)  Abnormality of gait       G-Codes - [id number]    Functional Assessment Tool Used (Outpatient Only) clinical judgement, 6 min walk, 5 times sit<>Stand, strength, 10 meter walk test; patient does demonstrate ability to improve further with additional rehab;    Functional Limitation Mobility: Walking and moving around   Mobility: Walking and Moving Around Current Status (([id number] At least 20 percent but less than 40 percent impaired, limited or restricted   Mobility: Walking and Moving Around Goal Status [phone] At least 20 percent but less than 40 percent impaired, limited or restricted      Problem List Patient Active Problem List   Diagnosis Date Noted  . Hypertensive urgency 12/31/2016  . Stroke ([place] 10/15/2016  . TIA (transient ischemic attack) 03/07/2016  . Acute CVA (ce[place]brovascular accident) ([place] 07/03/2015   Hibo Blasdell MLenis Dickinson SPT This entire session was performed under direct supervision and direction of a licensed therapist/therapist assistant . I have personally read, edited and approve of the note as written.  Trotter,Margaret PT, DPT 8Sep 12, 2018 5:44 PM  [place] [facility] [address] [place] [address]Phone: [phone]  Fax:  [phone] Name: Christopher SchurmanMRN: [id number] of Birth: 910/06/1926

## 2017-02-24 ENCOUNTER — Ambulatory Visit: Payer: Medicare Other | Admitting: Physical Therapy

## 2017-02-26 ENCOUNTER — Ambulatory Visit: Payer: Medicare Other | Admitting: Physical Therapy

## 2017-03-03 ENCOUNTER — Ambulatory Visit: Payer: Medicare Other | Admitting: Physical Therapy

## 2017-03-03 ENCOUNTER — Encounter: Payer: Self-pay | Admitting: Physical Therapy

## 2017-03-03 DIAGNOSIS — R2681 Unsteadiness on feet: Secondary | ICD-10-CM | POA: Diagnosis not present

## 2017-03-03 DIAGNOSIS — R269 Unspecified abnormalities of gait and mobility: Secondary | ICD-10-CM

## 2017-03-03 DIAGNOSIS — M6281 Muscle weakness (generalized): Secondary | ICD-10-CM

## 2017-03-03 NOTE — Therapy (Signed)
[place] MAIN [facility] SERVICES [address] [place], [place], [address] Phone: [phone]   Fax:  [phone]  Physical Therapy Treatment  Patient Details  Name: Christopher [name] MRN: [id number] Date of Birth: 24-Jul-1925 Referring Provider: Tomasa Hose  Encounter Date: 03/03/2017      PT End of Session - 03/03/17 2007    Visit Number 11   Number of Visits 17   Date for PT Re-Evaluation 03/18/17   Authorization Type Gcode 1   Authorization Time Period 10   PT Start Time 1602   PT Stop Time 2248   PT Time Calculation (min) 42 min   Equipment Utilized During Treatment Gait belt   Activity Tolerance Patient tolerated treatment well;No increased pain   Behavior During Therapy WFL for tasks assessed/performed      Past Medical History:  Diagnosis Date  . Arthritis   . Ataxia   . Diabetes mellitus without complication ([place])    controlled, pt checks it in the morning   . Dyslipidemia   . Hypertension    somewhat controlled, pt checks every morning, he reports it has been good.  . Stroke ([place])   . TIA (transient ischemic attack)     Past Surgical History:  Procedure Laterality Date  . none      There were no vitals filed for this visit.      Subjective Assessment - 03/03/17 1615    Subjective Pt reports he is doing well; no fall since last visit. Pt's son is somewhat concerned about a slight increase in edema in bilateral LEs; he will be following up with MD tomorrow. Pt reports no pain at this time.    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 Pt saw his neurologist on 6/25 for worsening L sided weakness that he had been experiencing since 12/27/16 and was referred to OP imaging. The next day he went to the ED due to uncontrolled HTN an L sided weakness.  Communication was difficult due to pt  being hard of hearing and speaking english as a second language. Pt is a poor historian with somewhat inconsistant answers.   Limitations Walking;Standing   How long can you sit comfortably? several hours   How long can you stand comfortably? 5 minutes   How long can you walk comfortably? about 10 ft   Diagnostic tests MR 6/27: IMPRESSION: No acute intracranial abnormality identified. Small chronic infarcts in basal ganglia, left paramedian pons,and right frontal subcortical white matter. Stable moderate chronic microvascular ischemic changes and mild parenchymal volume loss of the brain. Single interval punctate focus of microhemorrhage and left brachium pontis. Additional scattered stable chronic foci of microhemorrhage with central predominance, likely related to chronic hypertension.   Patient Stated Goals wants to be steady when walking, and for his R LE to work the same as his L LE   Currently in Pain? No/denies         Treatment:   Ther-ex:               NuStep x 4 min at level 6 resistance (high resistance) cues to keep SPM above 70 for cardiovascular response. (unbilled)  Standing in parallel bars               with 4# ankle weight on B LEs  Hip abduction R/L 2 x 15, cues to slow down eccentric motion and to stand up with erect posture                             Hip flexion straight leg x 15 ea; cues to keep knee straight and to increase AROM                          Hip Extension R/L x 12; Cues to avoid forward lean compensation and not to externally rotate hip.   Neuro re-education:     Standing in parallel bars:   Stance progression on firm surface including neutral stance, Romberg, semi-tandem, and tandem stance with lateral and vertical head turns x 30 sec in each stance with each direction of head turns; pt had occasional need for UE support that increased in tandem stance; increased postural sway with head turns    Repeated standing on 2"  Airex foam for increased challenge to balance; pt had increased sway on foam with multiple LOB in posterior and post-left directions    Ball passes to R and L x 10 each, standing on firm surface in romberg and semi-tandem for weight shift and functional turning; pt had no significant LOB, though he had increased sway in semi-tandem stance.          PT Education - 03/03/17 2005    Education provided Yes   Education Details ther-ex, Automotive engineer) Educated Patient   Methods Explanation;Demonstration;Tactile cues;Verbal cues   Comprehension Verbalized understanding;Returned demonstration;Verbal cues required;Tactile cues required             PT Long Term Goals - 02/19/17 1625      PT LONG TERM GOAL #1   Title Patient will be independent in home exercise program to improve strength/mobility for better functional independence with ADLs   Baseline Pt is adherent to HEP   Time 8   Period Weeks   Status Partially Met     PT LONG TERM GOAL #2   Title Patient will increase 10 meter walk test to >0.59ms as to improve gait speed for better community ambulation and to reduce fall risk   Baseline 0.455m at beginning of therapy with quad cane, 0.7134mfollowing therapy with RW  7/19. 0.7m56mo AD 8/2, 0.20m/37m16 no AD   Time 8   Period Weeks   Status Achieved     PT LONG TERM GOAL #3   Title Patient will increase gross BLE strength to 4+/5 in order to demonstrate safety with ADLs.     Baseline 4+ to 5/5 grossly   Time 8   Period Weeks   Status Achieved     PT LONG TERM GOAL #4   Title Patient will increase six minute walk test distance to >1000 for progression to community ambulator and improve gait ability   Baseline 550 on 7/17/118; 955' on 8/16   Time 8   Period Weeks   Status Partially Met     PT LONG TERM GOAL #5   Title Patient (> 60 ye53s old) will complete five times sit to stand test in < 15 seconds indicating an increased LE strength and improved  balance.   Baseline 16.18 sec on 01/20/17; 11.28 sec on 8/16   Time 8   Period Weeks   Status Achieved  Plan - 03/03/17 2008    Clinical Impression Statement Pt led in ther-ex for general LE strengthening and balance training. Balance training included stance on firm and foam surface with head turns and weighted ball passes to improve functional dynamic balance. Pt had repeated posterior LOB in tandem stance, which worsened on foam surface. Pt had delayed awareness of posterior lean. Pt will benefit from continued skilled PT to maximize his functional mobility and safety.   Rehab Potential Fair   Clinical Impairments Affecting Rehab Potential Positive: motivated, family support, assistive services at home; Negative: chronic issue including multiple strokes, previous falls resulting in injury   PT Frequency 2x / week   PT Duration 8 weeks   PT Treatment/Interventions ADLs/Self Care Home Management;Aquatic Therapy;Biofeedback;Cryotherapy;Electrical Stimulation;Moist Heat;DME Instruction;Gait training;Stair training;Functional mobility training;Therapeutic activities;Therapeutic exercise;Balance training;Neuromuscular re-education;Patient/family education;Manual techniques;Passive range of motion;Energy conservation;Canalith Repostioning;Cognitive remediation;Dry needling;Taping;Vestibular;Visual/perceptual remediation/compensation   PT Next Visit Plan Initiate HEP, 59mwalk test, further assess balance, dynamic stability, gait training, progress BLE strengthening   PT Home Exercise Plan Will initiate next session   Consulted and Agree with Plan of Care Patient      Patient will benefit from skilled therapeutic intervention in order to improve the following deficits and impairments:  Decreased activity tolerance, Decreased balance, Decreased endurance, Decreased mobility, Decreased strength, Difficulty walking, Hypomobility, Impaired flexibility, Improper body mechanics, Postural  dysfunction, Abnormal gait, Cardiopulmonary status limiting activity, Decreased coordination, Decreased cognition, Decreased knowledge of use of DME, Impaired vision/preception  Visit Diagnosis: Muscle weakness (generalized)  Unsteadiness on feet  Abnormality of gait     Problem List Patient Active Problem List   Diagnosis Date Noted  . Hypertensive urgency 12/31/2016  . Stroke ([place] 10/15/2016  . TIA (transient ischemic attack) 03/07/2016  . Acute CVA (cerebrovascular accident) ([place] 07/03/2015   Bentleigh Stankus MLenis Dickinson SPT This entire session was performed under direct supervision and direction of a licensed therapist/therapist assistant . I have personally read, edited and approve of the note as written.  Trotter,Margaret PT, DPT 03/04/2017, 8:44 AM  [place] [facility] CenterSERVICES [address]RM[address] [address]Phone: [phone]  Fax:  [phone] Name: IDemarius ArchilaMRN: [id number] of Birth: 906/12/1925

## 2017-03-05 ENCOUNTER — Ambulatory Visit
Admission: RE | Admit: 2017-03-05 | Discharge: 2017-03-05 | Disposition: A | Discharge status: Home / Self Care | Payer: Medicare Other | Source: Ambulatory Visit | Attending: Family Medicine | Admitting: Family Medicine

## 2017-03-05 ENCOUNTER — Ambulatory Visit: Payer: Medicare Other | Admitting: Physical Therapy

## 2017-03-05 ENCOUNTER — Encounter: Payer: Self-pay | Admitting: Physical Therapy

## 2017-03-05 ENCOUNTER — Other Ambulatory Visit: Payer: Self-pay | Admitting: Family Medicine

## 2017-03-05 DIAGNOSIS — M6281 Muscle weakness (generalized): Secondary | ICD-10-CM

## 2017-03-05 DIAGNOSIS — R918 Other nonspecific abnormal finding of lung field: Secondary | ICD-10-CM

## 2017-03-05 DIAGNOSIS — R2681 Unsteadiness on feet: Secondary | ICD-10-CM

## 2017-03-05 NOTE — Therapy (Signed)
[place] MAIN [facility] SERVICES [address] [place], [place], [address] Phone: [phone]   Fax:  [phone]  Physical Therapy Treatment  Patient Details  Name: Christopher [name] MRN: [id number] Date of Birth: [birth year] Referring Provider: Tomasa Hose  Encounter Date: 03/05/2017      PT End of Session - 03/05/17 1609    Visit Number 12   Number of Visits 17   Date for PT Re-Evaluation 03/18/17   Authorization Type Gcode 2   Authorization Time Period 10   PT Start Time 1602   PT Stop Time 7253   PT Time Calculation (min) 43 min   Equipment Utilized During Treatment Gait belt   Activity Tolerance Patient tolerated treatment well;No increased pain   Behavior During Therapy WFL for tasks assessed/performed      Past Medical History:  Diagnosis Date  . Arthritis   . Ataxia   . Diabetes mellitus without complication ([place])    controlled, pt checks it in the morning   . Dyslipidemia   . Hypertension    somewhat controlled, pt checks every morning, he reports it has been good.  . Stroke ([place])   . TIA (transient ischemic attack)     Past Surgical History:  Procedure Laterality Date  . none      There were no vitals filed for this visit.      Subjective Assessment - 03/05/17 1608    Subjective Patient reports feeling okay today; he reports, "I still don't feel that my right leg is working like it should" He denies any new falls; reports compliance with HEP;    Patient is accompained by: Family member   Pertinent History Pt has a complex medical history including a recent stroke in Dec 2016, TIA in Sep 2017, and a L pontine stroke on 10/15/16. Pt saw his neurologist on 6/25 for worsening L sided weakness that he had been experiencing since 12/27/16 and was referred to OP imaging. The next day he went to the ED due to uncontrolled HTN an L sided weakness.  Communication was difficult due to pt being hard of hearing and speaking english as a  second language. Pt is a poor historian with somewhat inconsistant answers.   Limitations Walking;Standing   How long can you sit comfortably? several hours   How long can you stand comfortably? 5 minutes   How long can you walk comfortably? about 10 ft   Diagnostic tests MR 6/27: IMPRESSION: No acute intracranial abnormality identified. Small chronic infarcts in basal ganglia, left paramedian pons,and right frontal subcortical white matter. Stable moderate chronic microvascular ischemic changes and mild parenchymal volume loss of the brain. Single interval punctate focus of microhemorrhage and left brachium pontis. Additional scattered stable chronic foci of microhemorrhage with central predominance, likely related to chronic hypertension.   Patient Stated Goals wants to be steady when walking, and for his R LE to work the same as his L LE   Currently in Pain? No/denies       TREATMENT: Warm up on treadmill 1.0 mph with 2 HHA x4 min with supervision requiring cues to increase step length and to avoid foot drag; demonstrates decreased step length on RLE with increased foot drag;  Resisted walking: 12.5# forward/backward, side step each direction (4 way ) x2 laps each direction with min VCs to slow down eccentric return and to increase step length for better dynamic balance control; He required min A for balance control especially during side stepping with  difficulty shifting weight;    Exercise:  Leg Press  R LE 75# 2x 15  Pt verbal cues for LE position and to prevent hyperextension and to slowly do exercise for better motor control;   HOIST hamstring curl machine: BLE plate: #4, 3x10 with cues for positioning and to slow eccentric return for better strengthening; Patient reports fatigue at end of session;  Sit<>stand with yellow weighted ball overhead press x10 with cues for sequencing and technique;  Forward lunge on BOSU with 1 HHA on rail for safety x10 bilaterally with cues to  increase step length for better challenge;   Patient ambulated around gym without AD with close supervision for safety; He ambulates with slower gait speed and required cues to increase speed for better dynamic balance.                         PT Education - 03/05/17 1609    Education provided Yes   Education Details strengthening, balance, gait safety; HEP reinforced;    Person(s) Educated Patient   Methods Explanation;Demonstration;Verbal cues   Comprehension Verbalized understanding;Returned demonstration;Verbal cues required;Need further instruction             PT Long Term Goals - 02/19/17 1625      PT LONG TERM GOAL #1   Title Patient will be independent in home exercise program to improve strength/mobility for better functional independence with ADLs   Baseline Pt is adherent to HEP   Time 8   Period Weeks   Status Partially Met     PT LONG TERM GOAL #2   Title Patient will increase 10 meter walk test to >0.39ms as to improve gait speed for better community ambulation and to reduce fall risk   Baseline 0.444m at beginning of therapy with quad cane, 0.7149mfollowing therapy with RW  7/19. 0.88m80mo AD 8/2, 0.62m/83m16 no AD   Time 8   Period Weeks   Status Achieved     PT LONG TERM GOAL #3   Title Patient will increase gross BLE strength to 4+/5 in order to demonstrate safety with ADLs.     Baseline 4+ to 5/5 grossly   Time 8   Period Weeks   Status Achieved     PT LONG TERM GOAL #4   Title Patient will increase six minute walk test distance to >1000 for progression to community ambulator and improve gait ability   Baseline 550 on 7/17/118; 955' on 8/16   Time 8   Period Weeks   Status Partially Met     PT LONG TERM GOAL #5   Title Patient (> 60 ye20s old) will complete five times sit to stand test in < 15 seconds indicating an increased LE strength and improved balance.   Baseline 16.18 sec on 01/20/17; 11.28 sec on 8/16   Time 8    Period Weeks   Status Achieved               Plan - 03/05/17 1652    Clinical Impression Statement Patient instructed in advanced LE strengthening and balance exercise. He required cues for correct positioning with leg machines for better motor control and strengthening. patient also required mod VCs to increase step length and slow down eccentric return with stepping back on resisted walking. patient exhibits decreased weight shift with imbalance requiring min A for stance control. He would benefit from additional skilled PT intervention to improve strength, balance and gait safety;  Patient expressed interest in joining group exercise class after discharge from PT.    Rehab Potential Fair   Clinical Impairments Affecting Rehab Potential Positive: motivated, family support, assistive services at home; Negative: chronic issue including multiple strokes, previous falls resulting in injury   PT Frequency 2x / week   PT Duration 8 weeks   PT Treatment/Interventions ADLs/Self Care Home Management;Aquatic Therapy;Biofeedback;Cryotherapy;Electrical Stimulation;Moist Heat;DME Instruction;Gait training;Stair training;Functional mobility training;Therapeutic activities;Therapeutic exercise;Balance training;Neuromuscular re-education;Patient/family education;Manual techniques;Passive range of motion;Energy conservation;Canalith Repostioning;Cognitive remediation;Dry needling;Taping;Vestibular;Visual/perceptual remediation/compensation   PT Next Visit Plan work on strengthening, Designer, multimedia and balance;    PT Home Exercise Plan continue as given;    Consulted and Agree with Plan of Care Patient      Patient will benefit from skilled therapeutic intervention in order to improve the following deficits and impairments:  Decreased activity tolerance, Decreased balance, Decreased endurance, Decreased mobility, Decreased strength, Difficulty walking, Hypomobility, Impaired flexibility, Improper body  mechanics, Postural dysfunction, Abnormal gait, Cardiopulmonary status limiting activity, Decreased coordination, Decreased cognition, Decreased knowledge of use of DME, Impaired vision/preception  Visit Diagnosis: Muscle weakness (generalized)  Unsteadiness on feet     Problem List Patient Active Problem List   Diagnosis Date Noted  . Hypertensive urgency 12/31/2016  . Stroke ([place]) 10/15/2016  . TIA (transient ischemic attack) 03/07/2016  . Acute CVA (cerebrovascular accident) ([place]) 07/03/2015    Kalani Sthilaire PT, DPT 03/05/2017, 4:54 PM  [place] MAIN [facility] SERVICES [address] [place], [place], [address] Phone: [phone]   Fax:  [phone]  Name: Christopher [name] MRN: [id number] Date of Birth: Nov 30, 1925

## 2017-03-10 ENCOUNTER — Encounter: Payer: Self-pay | Admitting: Physical Therapy

## 2017-03-10 ENCOUNTER — Ambulatory Visit: Payer: Medicare Other | Attending: Family Medicine | Admitting: Physical Therapy

## 2017-03-10 DIAGNOSIS — M6281 Muscle weakness (generalized): Secondary | ICD-10-CM | POA: Insufficient documentation

## 2017-03-10 DIAGNOSIS — R2681 Unsteadiness on feet: Secondary | ICD-10-CM | POA: Diagnosis present

## 2017-03-10 DIAGNOSIS — R269 Unspecified abnormalities of gait and mobility: Secondary | ICD-10-CM

## 2017-03-11 NOTE — Therapy (Signed)
[place] MAIN [facility] SERVICES [address] [place], [place], [address] Phone: [phone]   Fax:  [phone]  Physical Therapy Treatment  Patient Details  Name: Christopher [name] MRN: [id number] Date of Birth: Dec 03, 1925 Referring Provider: Tomasa Hose  Encounter Date: 03/10/2017      PT End of Session - 03/10/17 1641    Visit Number 13   Number of Visits 17   Date for PT Re-Evaluation 03/18/17   Authorization Type Gcode 3   Authorization Time Period 10   PT Start Time 1600   PT Stop Time 1640   PT Time Calculation (min) 40 min   Equipment Utilized During Treatment Gait belt   Activity Tolerance Patient tolerated treatment well;No increased pain   Behavior During Therapy WFL for tasks assessed/performed      Past Medical History:  Diagnosis Date  . Arthritis   . Ataxia   . Diabetes mellitus without complication ([place])    controlled, pt checks it in the morning   . Dyslipidemia   . Hypertension    somewhat controlled, pt checks every morning, he reports it has been good.  . Stroke ([place])   . TIA (transient ischemic attack)     Past Surgical History:  Procedure Laterality Date  . none      There were no vitals filed for this visit.      Subjective Assessment - 03/10/17 1638    Subjective Patient is feeling good today. He has no reports of pain.    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 Pt saw his neurologist on 6/25 for worsening L sided weakness that he had been experiencing since 12/27/16 and was referred to OP imaging. The next day he went to the ED due to uncontrolled HTN an L sided weakness.  Communication was difficult due to pt being hard of hearing and speaking english as a second language. Pt is a poor historian with somewhat inconsistant answers.   Limitations Walking;Standing    How long can you sit comfortably? several hours   How long can you stand comfortably? 5 minutes   How long can you walk comfortably? about 10 ft   Diagnostic tests MR 6/27: IMPRESSION: No acute intracranial abnormality identified. Small chronic infarcts in basal ganglia, left paramedian pons,and right frontal subcortical white matter. Stable moderate chronic microvascular ischemic changes and mild parenchymal volume loss of the brain. Single interval punctate focus of microhemorrhage and left brachium pontis. Additional scattered stable chronic foci of microhemorrhage with central predominance, likely related to chronic hypertension.   Patient Stated Goals wants to be steady when walking, and for his R LE to work the same as his L LE   Currently in Pain? No/denies   Pain Score 0-No pain   Multiple Pain Sites No      Treatment:   octane fitness x 5 mins,  TM walking x 5 mins at .8 and cues for heel strike and toe off for correct foot position  Leg press 75 lbs x 15 x 3, cues for slow and controlled movement  Side stepping with RTB x 10 feet x 5 laps, cues for posture  BLE extension with RTB x 20 BLE, cues for posture and technique  BLE abd with RTB x 20 BLE,cues for posture and techniqu  CGA and Min to mod  verbal cues used throughout . Patient has some fatigue with short rest periods throughout.                            PT Education - 03/10/17 1641    Education provided Yes   Education Details HEP and strengthening   Person(s) Educated Patient   Methods Explanation;Demonstration   Comprehension Verbalized understanding;Returned demonstration             PT Long Term Goals - 02/19/17 1625      PT LONG TERM GOAL #1   Title Patient will be independent in home exercise program to improve strength/mobility for better functional independence with ADLs   Baseline Pt is adherent to HEP   Time 8   Period Weeks   Status Partially Met     PT LONG TERM  GOAL #2   Title Patient will increase 10 meter walk test to >0.68ms as to improve gait speed for better community ambulation and to reduce fall risk   Baseline 0.424m at beginning of therapy with quad cane, 0.7154mfollowing therapy with RW  7/19. 0.40m11mo AD 8/2, 0.72m/59m16 no AD   Time 8   Period Weeks   Status Achieved     PT LONG TERM GOAL #3   Title Patient will increase gross BLE strength to 4+/5 in order to demonstrate safety with ADLs.     Baseline 4+ to 5/5 grossly   Time 8   Period Weeks   Status Achieved     PT LONG TERM GOAL #4   Title Patient will increase six minute walk test distance to >1000 for progression to community ambulator and improve gait ability   Baseline 550 on 7/17/118; 955' on 8/16   Time 8   Period Weeks   Status Partially Met     PT LONG TERM GOAL #5   Title Patient (> 60 ye75s old) will complete five times sit to stand test in < 15 seconds indicating an increased LE strength and improved balance.   Baseline 16.18 sec on 01/20/17; 11.28 sec on 8/16   Time 8   Period Weeks   Status Achieved               Plan - 03/10/17 1420    Clinical Impression Statement Patient was educated in advanced LE strengthening exercises without pain beehaviors and needed cues for correct posture and technique and cGA throughout. He is able to perform all moblity wiht sBA including tranfsers from sitting to standing and transitioning from low surfaces to standing with use of BUE. he will continue to benefit from skilled PT to improve strength, gait speed and safety.    Rehab Potential Fair   Clinical Impairments Affecting Rehab Potential Positive: motivated, family support, assistive services at home; Negative: chronic issue including multiple strokes, previous falls resulting in injury   PT Frequency 2x / week   PT Duration 8 weeks   PT Treatment/Interventions ADLs/Self Care Home Management;Aquatic Therapy;Biofeedback;Cryotherapy;Electrical Stimulation;Moist  Heat;DME Instruction;Gait training;Stair training;Functional mobility training;Therapeutic activities;Therapeutic exercise;Balance training;Neuromuscular re-education;Patient/family education;Manual techniques;Passive range of motion;Energy conservation;Canalith Repostioning;Cognitive remediation;Dry needling;Taping;Vestibular;Visual/perceptual remediation/compensation   PT Next Visit Plan work on strengthening, gait Designer, multimediabalance;    PT Home Exercise Plan continue as given;    Consulted and Agree with Plan of Care Patient      Patient will benefit from skilled therapeutic intervention in order to improve the following deficits and impairments:  Decreased activity  tolerance, Decreased balance, Decreased endurance, Decreased mobility, Decreased strength, Difficulty walking, Hypomobility, Impaired flexibility, Improper body mechanics, Postural dysfunction, Abnormal gait, Cardiopulmonary status limiting activity, Decreased coordination, Decreased cognition, Decreased knowledge of use of DME, Impaired vision/preception  Visit Diagnosis: Muscle weakness (generalized)  Unsteadiness on feet  Abnormality of gait     Problem List Patient Active Problem List   Diagnosis Date Noted  . Hypertensive urgency 12/31/2016  . Stroke ([place]) 10/15/2016  . TIA (transient ischemic attack) 03/07/2016  . Acute CVA (cerebrovascular accident) ([place]) 07/03/2015    Alanson Puls, PT DPT 03/11/2017, 2:23 PM  [place] MAIN [facility] SERVICES [address] [place], [place], [address] Phone: [phone]   Fax:  [phone]  Name: Christopher [name] MRN: [id number] Date of Birth: 1926/02/21

## 2017-03-16 IMAGING — US US CAROTID DUPLEX BILAT
1 series · 13 of 24 positions shown · non-contrast
Comparison: 07/03/2015

CLINICAL DATA: TIA, stroke, hypertension, hyperlipidemia and
diabetes.

EXAM:
BILATERAL CAROTID DUPLEX ULTRASOUND
TECHNIQUE: Gray scale imaging, color Doppler and duplex ultrasound were
performed of bilateral carotid and vertebral arteries in the neck.

[Series 1: us carotid duplex bilat · 0.06mm/px · 13 of 74 slices shown]
[im 1/74]
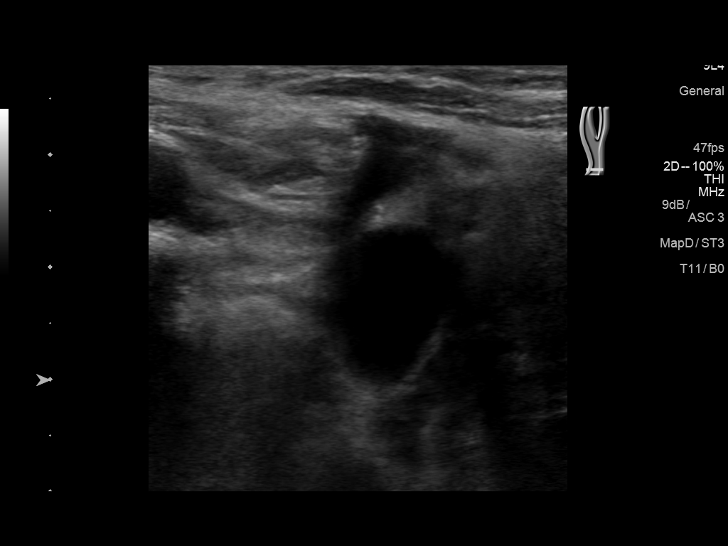
[im 7/74]
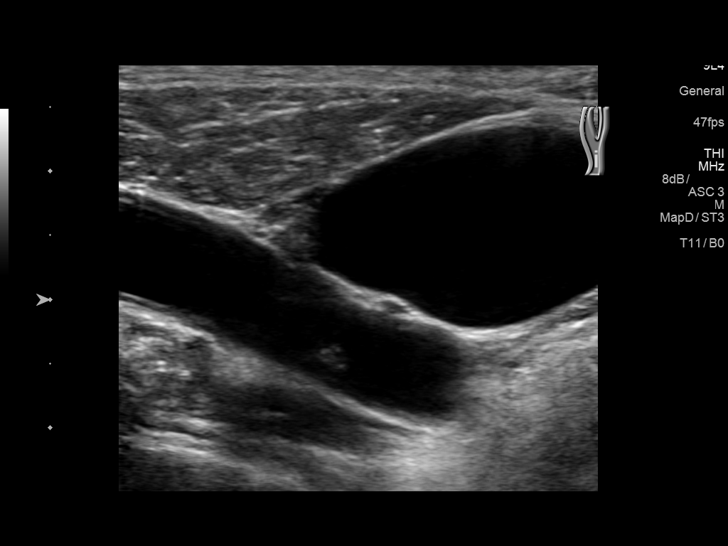
[im 13/74]
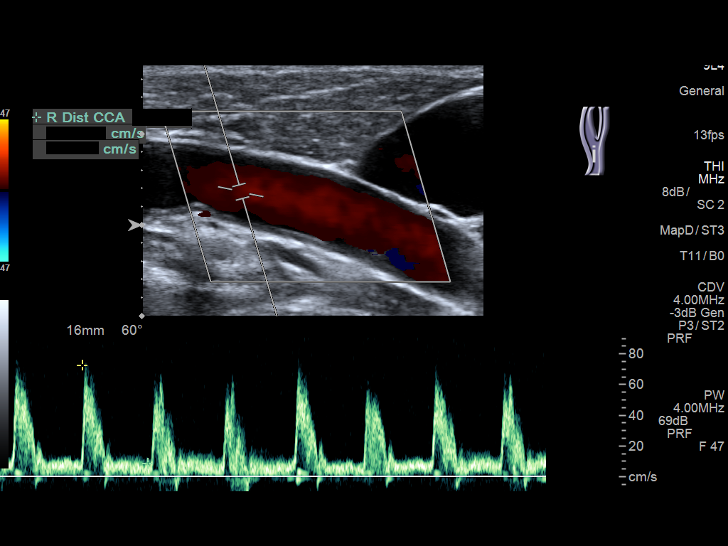
[im 20/74]
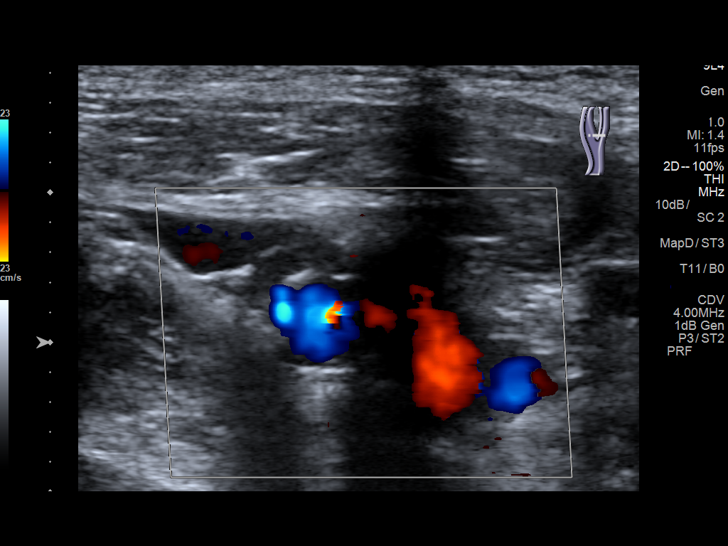
[im 26/74]
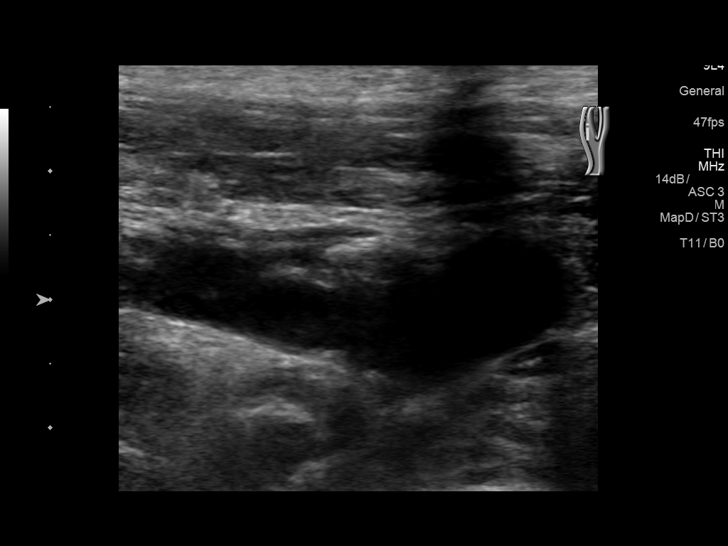
[im 32/74]
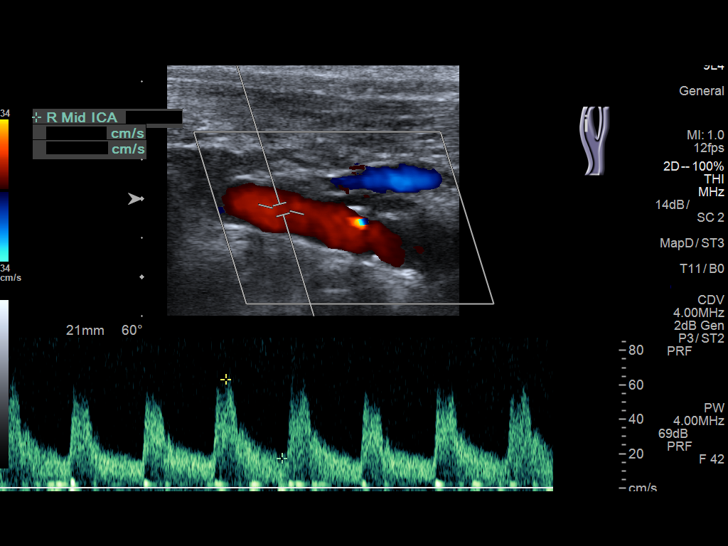
[im 39/74]
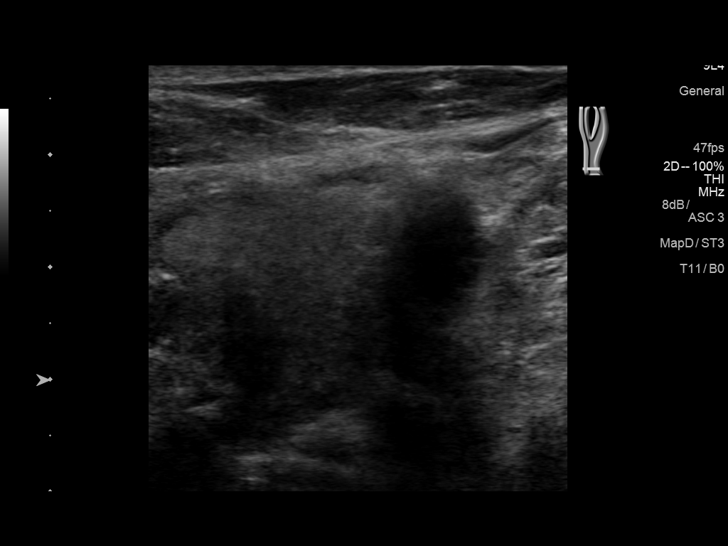
[im 42/74]
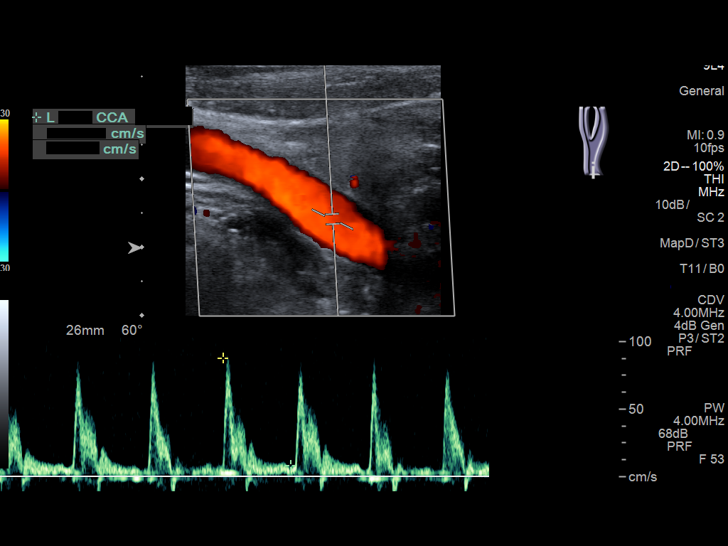
[im 48/74]
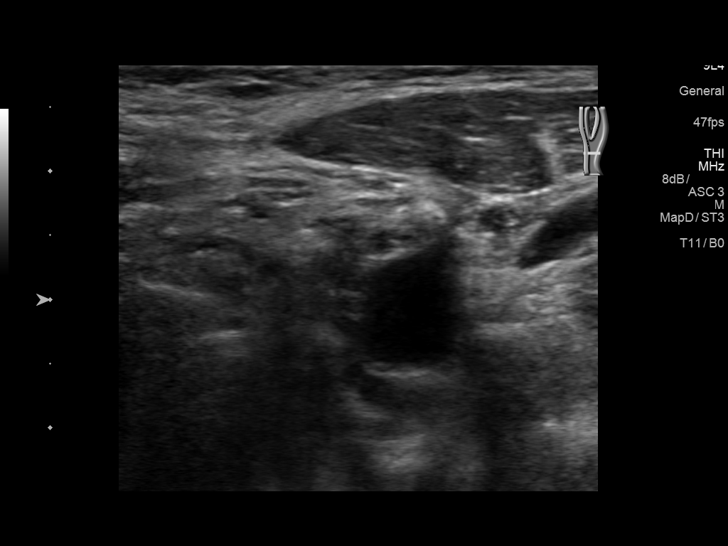
[im 54/74]
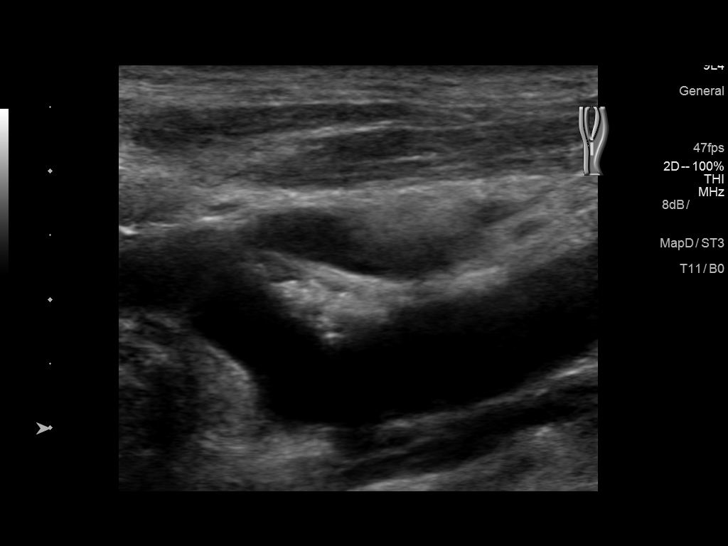
[im 61/74]
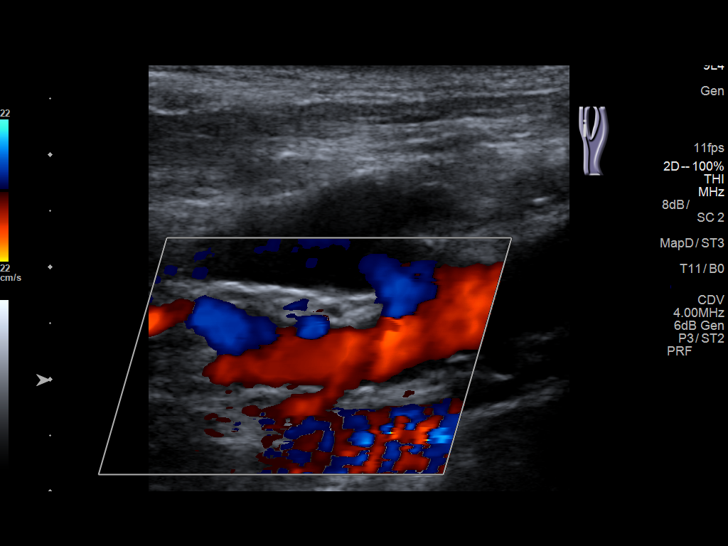
[im 67/74]
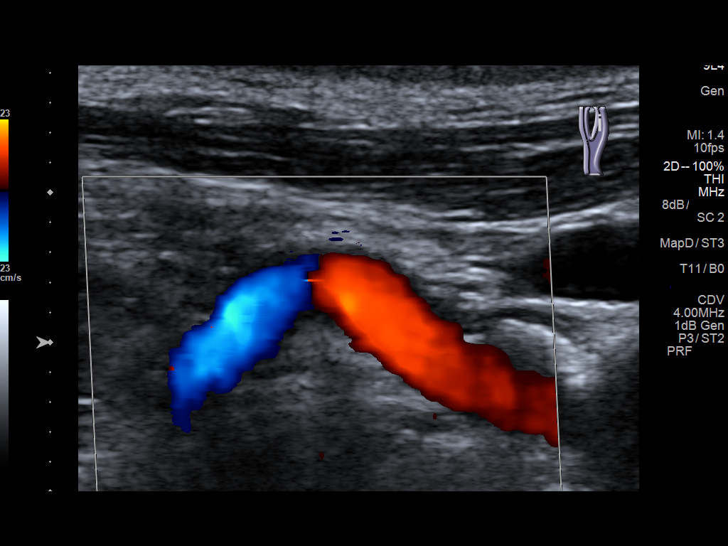
[im 74/74]
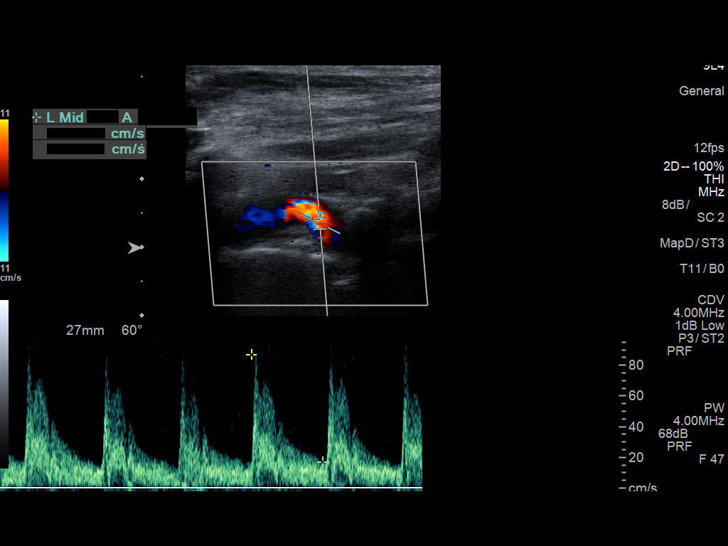

[13 of 24 positions shown; findings below may reference images not displayed]

FINDINGS: Criteria: Quantification of carotid stenosis is based on velocity
parameters that correlate the residual internal carotid diameter
with NASCET-based stenosis levels, using the diameter of the distal
internal carotid lumen as the denominator for stenosis measurement.

The following velocity measurements were obtained:

RIGHT

ICA:  81/26 cm/sec

CCA:  83/11 cm/sec

SYSTOLIC ICA/CCA RATIO:

DIASTOLIC ICA/CCA RATIO:

ECA:  51 cm/sec

LEFT

ICA:  65/16 cm/sec

CCA:  72/8 cm/sec

SYSTOLIC ICA/CCA RATIO:

DIASTOLIC ICA/CCA RATIO:

ECA:  37 cm/sec

RIGHT CAROTID ARTERY: Stable calcified and noncalcified plaque at
the level of the carotid bulb and proximal ICA of mild to moderate
amount. Velocities and waveforms are normal and estimated proximal
right ICA stenosis remains less than 50%.

RIGHT VERTEBRAL ARTERY: Antegrade flow with normal waveform and
velocity.

LEFT CAROTID ARTERY: Stable mild amount of calcified plaque at the
level of the distal bulb and proximal ICA. Estimated left ICA
stenosis is less than 50%. The left internal carotid artery is
moderately tortuous.

LEFT VERTEBRAL ARTERY: Antegrade flow with normal waveform and
velocity.
IMPRESSION: Plaque at the level of both carotid bulbs and proximal internal
carotid arteries, slightly more prominently on the right. Overall
plaque burden appears similar to the prior ultrasound. No
significant carotid stenosis identified with estimated bilateral ICA
stenoses of less than 50%.

## 2017-03-17 ENCOUNTER — Ambulatory Visit: Payer: Medicare Other | Admitting: Physical Therapy

## 2017-03-19 ENCOUNTER — Ambulatory Visit: Payer: Medicare Other | Admitting: Physical Therapy

## 2017-03-23 ENCOUNTER — Ambulatory Visit: Payer: Medicare Other | Admitting: Physical Therapy

## 2017-03-23 ENCOUNTER — Encounter: Payer: Self-pay | Admitting: Physical Therapy

## 2017-03-23 DIAGNOSIS — R269 Unspecified abnormalities of gait and mobility: Secondary | ICD-10-CM

## 2017-03-23 DIAGNOSIS — M6281 Muscle weakness (generalized): Secondary | ICD-10-CM

## 2017-03-23 DIAGNOSIS — R2681 Unsteadiness on feet: Secondary | ICD-10-CM

## 2017-03-23 NOTE — Therapy (Signed)
[place] MAIN [facility] SERVICES [address] [place], [place], [address] Phone: [phone]   Fax:  [phone]  Physical Therapy Treatment/Discharge Note  Patient Details  Name: Christopher [name] MRN: [id number] Date of Birth: 1925-09-15 Referring Provider: Tomasa Hose  Encounter Date: 03/23/2017      PT End of Session - 03/23/17 1848    Visit Number 14   Number of Visits 18   Date for PT Re-Evaluation 03/31/17   Authorization Type Gcode 4   Authorization Time Period 10   PT Start Time 1646   PT Stop Time 1729   PT Time Calculation (min) 43 min   Equipment Utilized During Treatment Gait belt   Activity Tolerance Patient tolerated treatment well;No increased pain   Behavior During Therapy WFL for tasks assessed/performed      Past Medical History:  Diagnosis Date  . Arthritis   . Ataxia   . Diabetes mellitus without complication ([place])    controlled, pt checks it in the morning   . Dyslipidemia   . Hypertension    somewhat controlled, pt checks every morning, he reports it has been good.  . Stroke ([place])   . TIA (transient ischemic attack)     Past Surgical History:  Procedure Laterality Date  . none      There were no vitals filed for this visit.      Subjective Assessment - 03/23/17 1841    Subjective Pt reports he is doing well today, other than feeling like he has forgotten how to walk. Pt reports he feels his walking had declined today due to his aid not being at work this week due to bad weather, so pt has not been performing his HEP.   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 Pt saw his neurologist on 6/25 for worsening L sided weakness that he had been experiencing since 12/27/16 and was referred to OP imaging. The next day he went to the ED due to uncontrolled HTN an L sided  weakness.  Communication was difficult due to pt being hard of hearing and speaking english as a second language. Pt is a poor historian with somewhat inconsistant answers.   Limitations Walking;Standing   How long can you sit comfortably? several hours   How long can you stand comfortably? 5 minutes   How long can you walk comfortably? about 10 ft   Diagnostic tests MR 6/27: IMPRESSION: No acute intracranial abnormality identified. Small chronic infarcts in basal ganglia, left paramedian pons,and right frontal subcortical white matter. Stable moderate chronic microvascular ischemic changes and mild parenchymal volume loss of the brain. Single interval punctate focus of microhemorrhage and left brachium pontis. Additional scattered stable chronic foci of microhemorrhage with central predominance, likely related to chronic hypertension.   Patient Stated Goals wants to be steady when walking, and for his R LE to work the same as his L LE   Currently in Pain? No/denies         Treatment:  Warm up:    Treadmill walking with ball kicks for increased stride length x 2 min with cues to increase speed and take bigger steps; pt able to kick ball with every step up to 0.8 mph with increased stride length following warm-up   Ther-ex:  Goals Re-assessed:   6 min walk test: 26' today,  which is slightly decreased from 955' on 8/16; pt reports that he feels very slow today because he has not done his HEP in over a week due to his aid being off work.    HEP: Pt usually performs exercise everyday with his aide.  Sitting  Leg press: RLE 60# 2 x 15; cues to avoid terminal knee extension; pt reported exercise was not too difficult and was able to perform with good control and biomechanics.   Neuro Re-education  Standing in parallel bars:   Lunges on BOSU ball x 10 each LE alternating; cues to minimize UE support; pt required single UE support and cues to isolate motion; cues to stand back for deeper  lunge.   Step ups on BOSU x 5 each LE; cues for slower eccentric motion with increased knee control; pt able to improve control with cueing.   SLS 4 x 15 sec each side, cues to minimize UE support; pt able to balance x approx 10 sec on LLE and approx 5 sec on RLE without UE support; exercise continued with single UE fingertip support   Heel/toe rocking on half bolster; pt given demonstration and cues to increase AROM and to use only single UE support for increased challenge. Pt with difficulty achieving DF much above neutral with heel rocking motion.     Side-to-side rocking on large rocker board x 10 with single UE support and x 10 with no UE support, CGA for safety. Cues to slow down motion and to avoid hitting the board into the ground for increased control.         PT Education - 03/23/17 1843    Education provided Yes   Education Details Progress towards goals, Ther-ex, balance training    Person(s) Educated Patient;Child(ren)   Methods Verbal cues;Tactile cues;Demonstration;Explanation   Comprehension Verbalized understanding;Returned demonstration;Verbal cues required;Tactile cues required             PT Long Term Goals - 03/23/17 1854      PT LONG TERM GOAL #1   Title Patient will be independent in home exercise program to improve strength/mobility for better functional independence with ADLs   Baseline Pt usually performs exercise everyday with his aid    Time 8   Period Weeks   Status Achieved   Target Date 03/18/17     PT LONG TERM GOAL #2   Title Patient will increase 10 meter walk test to >0.82ms as to improve gait speed for better community ambulation and to reduce fall risk   Baseline 0.446m at beginning of therapy with quad cane, 0.7189mfollowing therapy with RW  7/19. 0.8m51mo AD 8/2, 0.74m/41m16 no AD   Time 8   Period Weeks   Status Achieved   Target Date 03/18/17     PT LONG TERM GOAL #3   Title Patient will increase gross BLE strength to 4+/5 in  order to demonstrate safety with ADLs.     Baseline 4+ to 5/5 grossly   Time 8   Period Weeks   Status Achieved   Target Date 03/18/17     PT LONG TERM GOAL #4   Title Patient will increase six minute walk test distance to >1000 for progression to community ambulator and improve gait ability   Baseline 860' on 9/17; pt can continue to improve activity tolerance in [employer] class   Time 8   Period Weeks   Status Not Met   Target Date 03/31/17  PT LONG TERM GOAL #5   Title Patient (> 50 years old) will complete five times sit to stand test in < 15 seconds indicating an increased LE strength and improved balance.   Baseline 16.18 sec on 01/20/17; 11.28 sec on 8/16   Time 8   Period Weeks   Status Achieved   Target Date 03/18/17               Plan - Apr 19, 2017 1848    Clinical Impression Statement Pt re-assessed in goals today including 6 min walk test and independence with HEP. Pt generally performs HEP everyday with his aide and has met his HEP goal. Pt did not meet his 6 min walk goal today due to him feeling very slow today because he his aide has been unable to help him with his HEP in over a week due bad weather. Pt expects to resume his HEP with his aide soon. Pt has met all other goals as is appropriate for discharge at this time with a referral to the [employer] supervised exercise class. Pt will benefit from supervised exercise in order to maximize his activity tolerance and continue to improve his cardiovascular fitness.   Rehab Potential Fair   Clinical Impairments Affecting Rehab Potential Positive: motivated, family support, assistive services at home; Negative: chronic issue including multiple strokes, previous falls resulting in injury   PT Frequency 1x / week   PT Duration Other (comment)  1 week   PT Treatment/Interventions ADLs/Self Care Home Management;Aquatic Therapy;Biofeedback;Cryotherapy;Electrical Stimulation;Moist Heat;DME  Instruction;Gait training;Stair training;Functional mobility training;Therapeutic activities;Therapeutic exercise;Balance training;Neuromuscular re-education;Patient/family education;Manual techniques;Passive range of motion;Energy conservation;Canalith Repostioning;Cognitive remediation;Dry needling;Taping;Vestibular;Visual/perceptual remediation/compensation   PT Next Visit Plan work on strengthening, Designer, multimedia and balance;    PT Home Exercise Plan continue as given;    Consulted and Agree with Plan of Care Patient      Patient will benefit from skilled therapeutic intervention in order to improve the following deficits and impairments:  Decreased activity tolerance, Decreased balance, Decreased endurance, Decreased mobility, Decreased strength, Difficulty walking, Hypomobility, Impaired flexibility, Improper body mechanics, Postural dysfunction, Abnormal gait, Cardiopulmonary status limiting activity, Decreased coordination, Decreased cognition, Decreased knowledge of use of DME, Impaired vision/preception  Visit Diagnosis: Muscle weakness (generalized) - Plan: PT plan of care cert/re-cert  Unsteadiness on feet - Plan: PT plan of care cert/re-cert  Abnormality of gait - Plan: PT plan of care cert/re-cert       G-Codes - 2017/04/19 1700    Functional Assessment Tool Used (Outpatient Only) 6 min walk, clinical judgement, strength;    Functional Limitation Mobility: Walking and moving around   Mobility: Walking and Moving Around Goal Status [phone]) At least 20 percent but less than 40 percent impaired, limited or restricted   Mobility: Walking and Moving Around Discharge Status [phone]) At least 20 percent but less than 40 percent impaired, limited or restricted      Problem List Patient Active Problem List   Diagnosis Date Noted  . Hypertensive urgency 12/31/2016  . Stroke ([place]) 10/15/2016  . TIA (transient ischemic attack) 03/07/2016  . Acute CVA (cerebrovascular accident) ([place])  07/03/2015   Tish Begin Lenis Dickinson, SPT This entire session was performed under direct supervision and direction of a licensed therapist/therapist assistant . I have personally read, edited and approve of the note as written.  Trotter,Margaret PT, DPT 03/24/2017, 8:54 AM  [place] MAIN [facility] SERVICES [address] [place], [place], [address] Phone: [phone]   Fax:  [phone]  Name: Christopher [name] MRN: [id number] Date of Birth: Apr 24, 1926

## 2017-03-25 ENCOUNTER — Ambulatory Visit: Payer: Medicare Other | Admitting: Physical Therapy

## 2017-03-30 ENCOUNTER — Ambulatory Visit: Payer: Medicare Other | Admitting: Physical Therapy

## 2017-04-01 ENCOUNTER — Ambulatory Visit: Payer: Medicare Other | Admitting: Physical Therapy

## 2017-04-06 ENCOUNTER — Ambulatory Visit: Payer: Medicare Other | Admitting: Physical Therapy

## 2017-04-08 ENCOUNTER — Ambulatory Visit: Payer: Medicare Other | Admitting: Physical Therapy

## 2017-04-13 ENCOUNTER — Ambulatory Visit: Payer: Medicare Other | Admitting: Physical Therapy

## 2017-04-15 ENCOUNTER — Ambulatory Visit: Payer: Medicare Other | Admitting: Physical Therapy

## 2017-04-20 ENCOUNTER — Ambulatory Visit: Payer: Medicare Other | Admitting: Physical Therapy

## 2017-04-22 ENCOUNTER — Encounter: Payer: Medicare Other | Admitting: Physical Therapy

## 2017-09-08 IMAGING — CT CT HEAD W/O CM
3 series · 16 of 46 positions shown, 19 images · non-contrast
Comparison: 10/01/2015 and prior CTs

CLINICAL DATA: 89-year-old male with ataxia for 2 days and recent
fall. Currently on Plavix.

EXAM:
CT HEAD WITHOUT CONTRAST
TECHNIQUE: Contiguous axial images were obtained from the base of the skull
through the vertex without intravenous contrast.

[Series 2: head wo · axial · 0.40mm/px · z∈[+635,+755]mm · 10 of 29 slices shown, 13 images]
[im 3/29  brain]
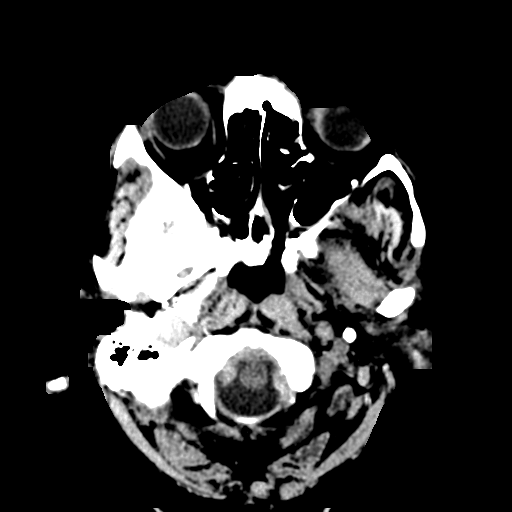
[im 3/29  bone]
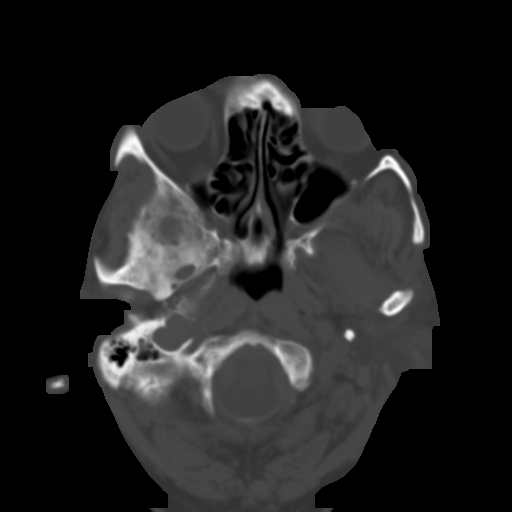
[im 6/29  brain]
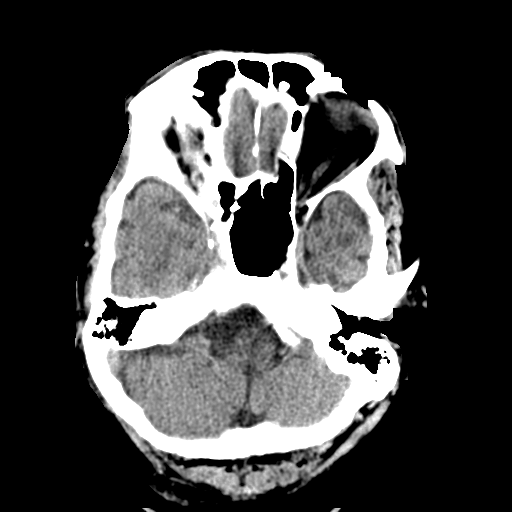
[im 8/29  brain]
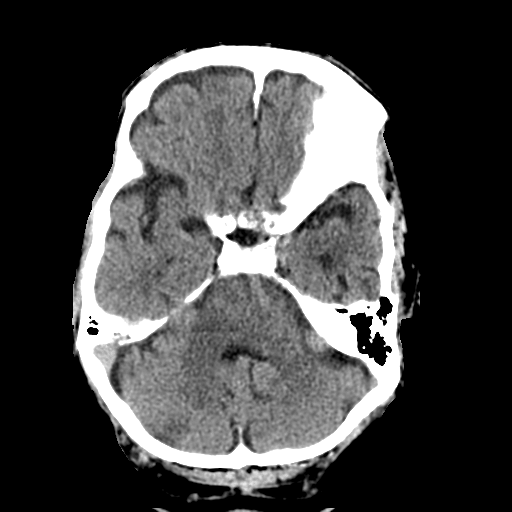
[im 11/29  brain]
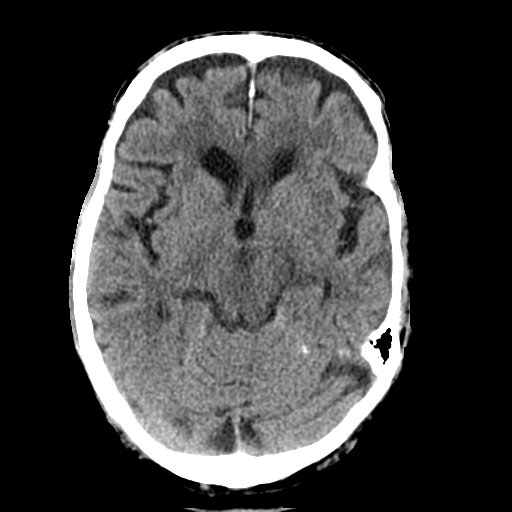
[im 14/29  brain]
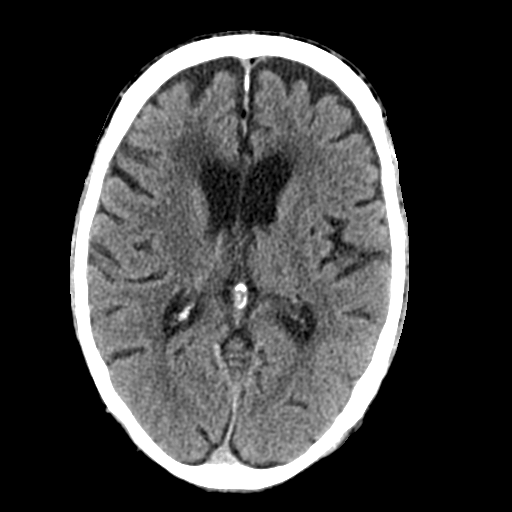
[im 14/29  bone]
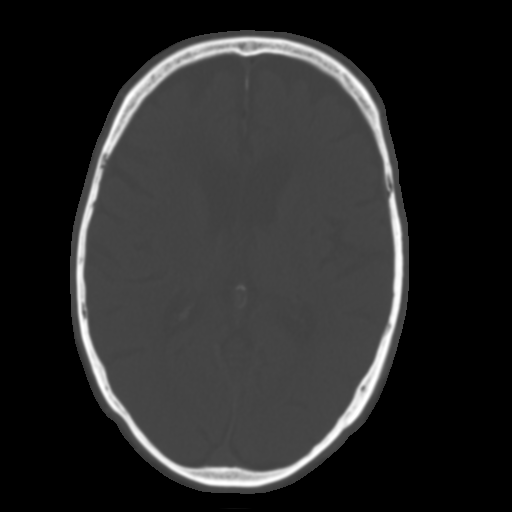
[im 16/29  brain]
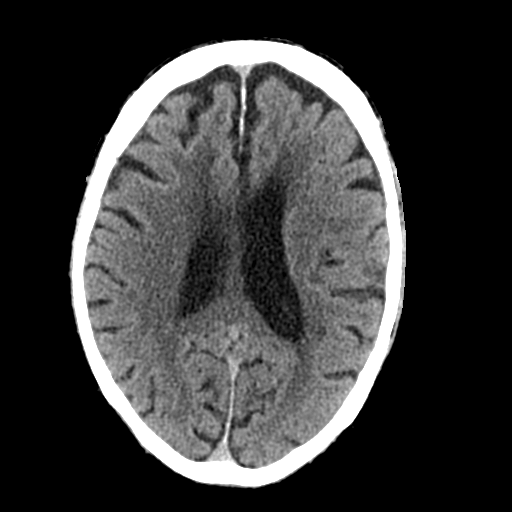
[im 19/29  brain]
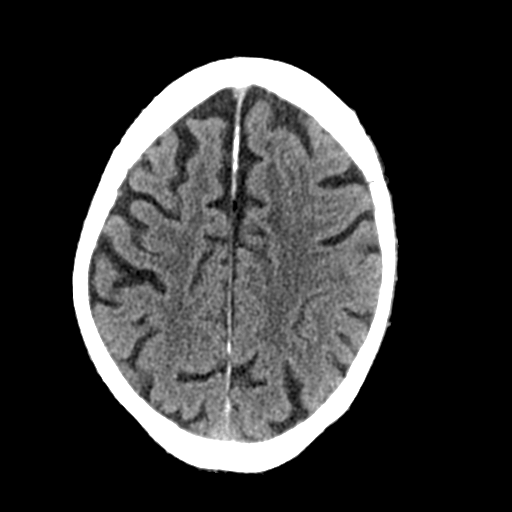
[im 22/29  brain]
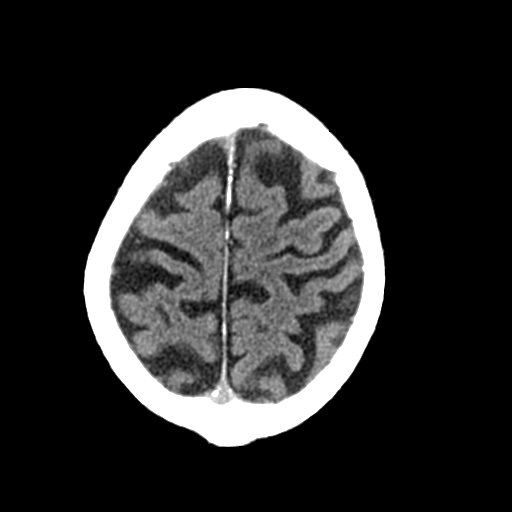
[im 24/29  brain]
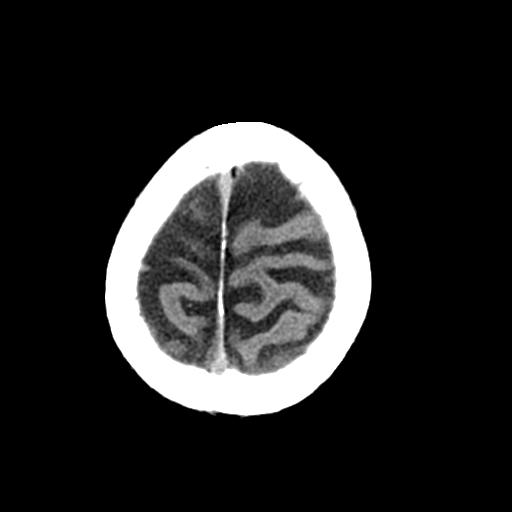
[im 24/29  bone]
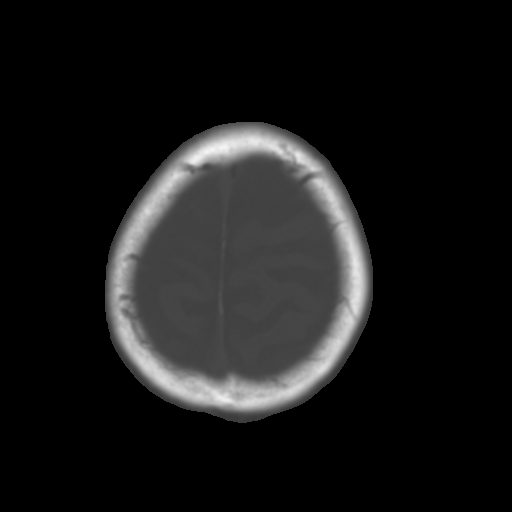
[im 27/29  brain]
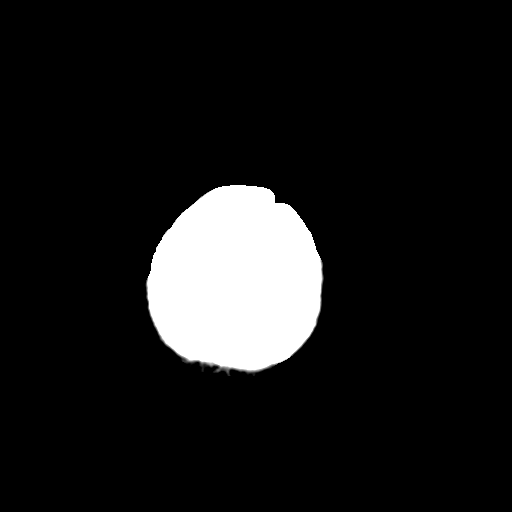

[Series 4: coronal soft tissue · coronal · 0.29mm/px · 3 of 63 slices shown]
[im 21/63  brain]
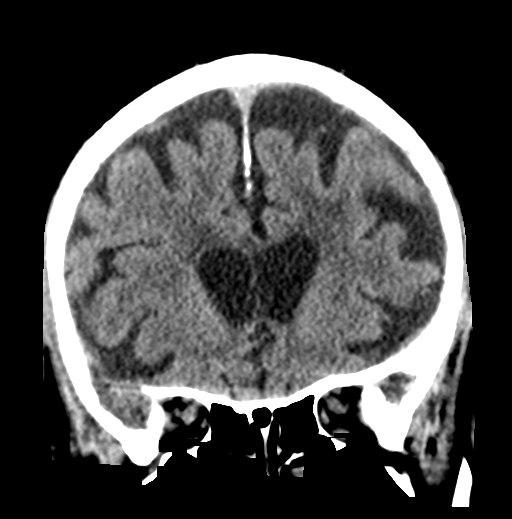
[im 28/63  brain]
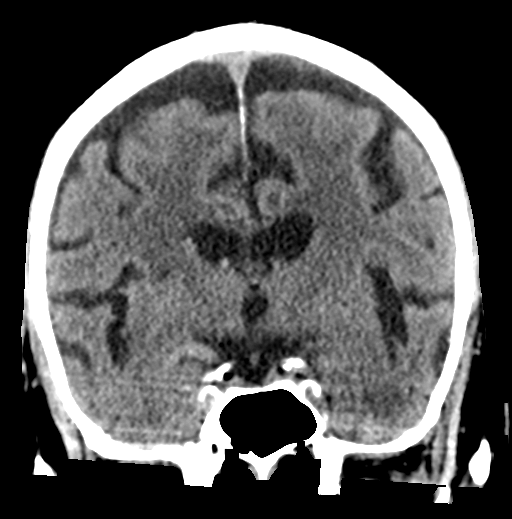
[im 35/63  brain]
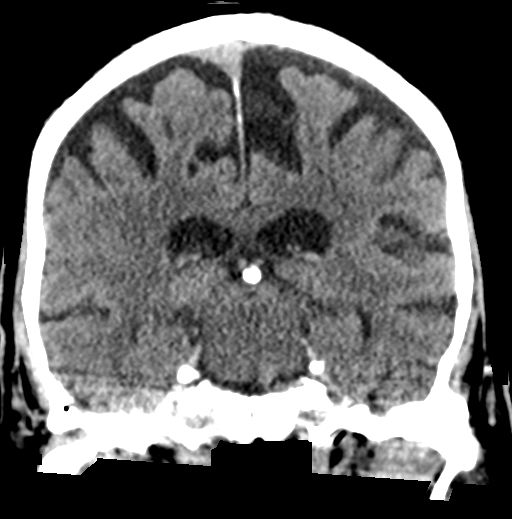

[Series 5: sagittal soft tissue · sagittal · 0.30mm/px · 3 of 50 slices shown]
[im 17/50  brain]
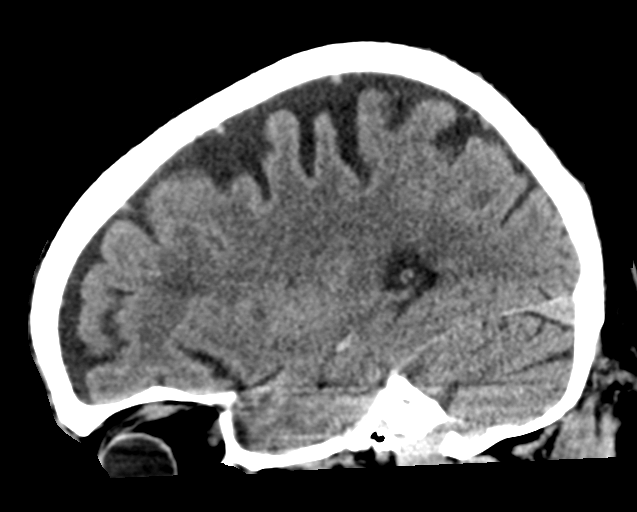
[im 25/50  brain]
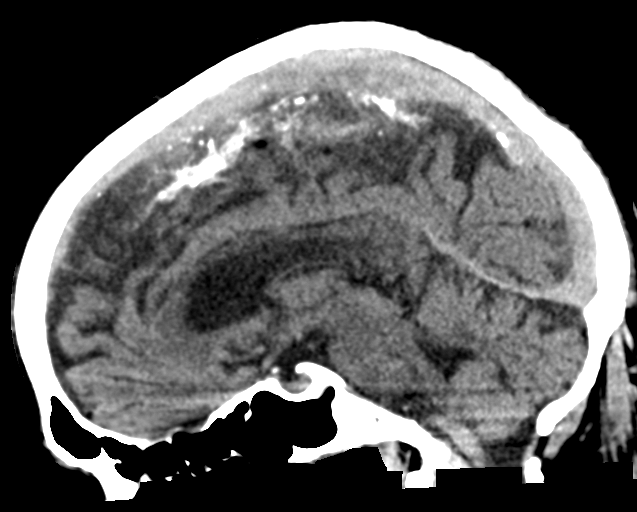
[im 33/50  brain]
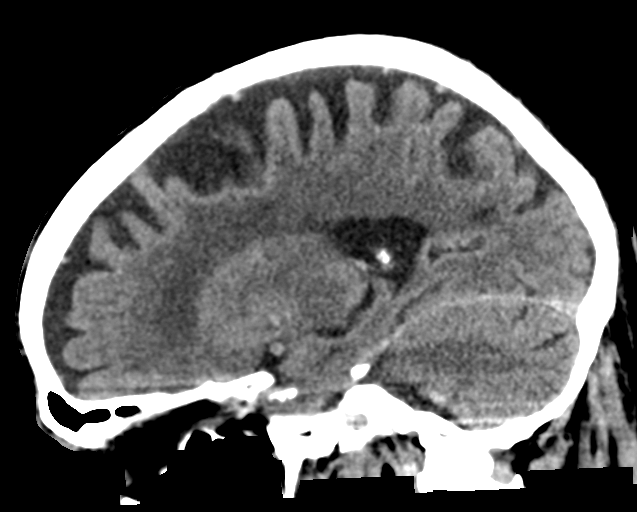

[16 of 46 positions shown; findings below may reference images not displayed]

FINDINGS: Brain: No evidence of acute infarction, hemorrhage, hydrocephalus,
extra-axial collection or mass lesion/mass effect. Moderate atrophy
and chronic small-vessel white matter ischemic changes again noted.

Vascular: Intracranial atherosclerosis.

Skull: Unremarkable.

Sinuses/Orbits: Visualized portions unremarkable.

Other: None
IMPRESSION: No evidence of acute intracranial abnormality.

Atrophy and chronic small-vessel white matter ischemic changes.

## 2017-10-27 ENCOUNTER — Other Ambulatory Visit: Payer: Self-pay

## 2017-10-27 ENCOUNTER — Ambulatory Visit: Payer: Medicare Other | Attending: Family Medicine

## 2017-10-27 DIAGNOSIS — M6281 Muscle weakness (generalized): Secondary | ICD-10-CM | POA: Insufficient documentation

## 2017-10-27 DIAGNOSIS — R2689 Other abnormalities of gait and mobility: Secondary | ICD-10-CM | POA: Diagnosis not present

## 2017-10-27 NOTE — Patient Instructions (Signed)
Access Code: [email or web address]  URL: [email or web address]  Date: 10/27/2017  Prepared by: Precious BardMarina Bauer Ausborn   Exercises  Standing March with Counter Support - 10 reps - 2 sets - 5 hold - 1x daily - 7x weekly  Seated Heel Toe Raises - 10 reps - 2 sets - 5 hold - 1x daily - 7x weekly  Seated Hip Adduction Isometrics with Ball - 10 reps - 2 sets - 5 hold - 1x daily - 7x weekly  Tandem Stance with Chair Support - 10 reps - 2 sets - 5 hold - 1x daily - 7x weekly

## 2017-10-27 NOTE — Therapy (Signed)
[place] [facility] MAIN [facility] SERVICES [address] [place], [place], [address] Phone: [phone]   Fax:  [phone]  Physical Therapy Evaluation  Patient Details  Name: Christopher [name] MRN: [id number] Date of Birth: August 30, 1925 Referring Provider: Dr. Hans Eden   Encounter Date: 10/27/2017  PT End of Session - 10/27/17 1715    Visit Number  1    Number of Visits  16    Date for PT Re-Evaluation  12/22/17    Authorization - Visit Number  1    Authorization - Number of Visits  10    PT Start Time  1605    PT Stop Time  1654    PT Time Calculation (min)  49 min    Equipment Utilized During Treatment  Gait belt    Activity Tolerance  Patient tolerated treatment well    Behavior During Therapy  [facility] for tasks assessed/performed       Past Medical History:  Diagnosis Date  . Arthritis   . Ataxia   . Diabetes mellitus without complication (HCC)    controlled, pt checks it in the morning   . Dyslipidemia   . Hypertension    somewhat controlled, pt checks every morning, he reports it has been good.  . Stroke (HCC)   . TIA (transient ischemic attack)     Past Surgical History:  Procedure Laterality Date  . none      There were no vitals filed for this visit.     OUTCOME MEASURES: TEST Outcome Interpretation  5 times sit<>stand 14 sec >60 yo, >15 sec indicates increased risk for falls  10 meter walk test              42=.45m/s  seconds with QC   m/s <1.0 m/s indicates increased risk for falls; limited community ambulator  Timed up and Go                 sec <14 sec indicates increased risk for falls  6 minute walk test     441           Feet 1000 feet is community Financial controller 42/56 <36/56 (100% risk for falls), 37-45 (80% risk for falls); 46-51 (>50% risk for falls); 52-55 (lower risk <25% of falls)  9 Hole Peg Test L:                R:        Subjective Assessment - 10/27/17 1658    Subjective  Patient is a  pleasant 84 year old man who presents to physical theapry for gait disturbance with son. Patient is hard of hearing and ESL. Understands most questions, however son interprets occasionally.     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 2017, and CVA Dec 2016. Was seen at this facility by a different therapist until 03/2017. Patient reports that he was doing his exercises at home when his nurse would come. Now he reports he does not have a nurse coming to his house and does not do his exercises.  Reports that he is not walking as fast now as he was able to before or for as long of distances. Reports no falls.     Limitations  Walking;House hold activities;Other (comment)    How long can you sit  comfortably?  n/a    How long can you stand comfortably?  n/a    How long can you walk comfortably?  across his house     Diagnostic tests  MR 6/27: IMPRESSION: No acute intracranial abnormality identified. Small chronic infarcts in basal ganglia, left paramedian pons,and right frontal subcortical white matter. Stable moderate chronic microvascular ischemic changes and mild parenchymal volume loss of the brain. Single interval punctate focus of microhemorrhage and left brachium pontis. Additional scattered stable chronic foci of microhemorrhage with central predominance, likely related to chronic hypertension.    Patient Stated Goals  Increase gait speed and walk for longer    Currently in Pain?  No/denies         [facility] PT Assessment - 10/27/17 0001      Assessment   Medical Diagnosis  Gait disturbance    Referring Provider  Dr. Hans Eden    Onset Date/Surgical Date  -- 2017/18    Hand Dominance  Right    Prior Therapy  yes, last year      Precautions   Precautions  None      Restrictions   Weight Bearing Restrictions  No      Balance Screen   Has the patient  fallen in the past 6 months  No    Has the patient had a decrease in activity level because of a fear of falling?   Yes    Is the patient reluctant to leave their home because of a fear of falling?   No      Home Environment   Living Environment  Private residence    Living Arrangements  Children    Available Help at Discharge  Family    Type of Home  House    Home Access  Ramped entrance    Home Layout  Two level    Alternate Level Stairs-Number of Steps  7    Alternate Level Stairs-Rails  Right    Home Equipment  Walker - 2 wheels;Cane - quad    Additional Comments  does not use cane inside the house too often, furniture surfs      Prior Function   Level of Independence  Independent with basic ADLs;Independent with community mobility with device    Vocation  Retired    Leisure  read and watch TV      Cognition   Overall Cognitive Status  Difficult to assess    Difficult to assess due to  Hard of hearing/deaf;Non-English speaking      Observation/Other Assessments   Observations  decreased time in stance phase bilaterally.       Sensation   Light Touch  Appears Intact      Coordination   Gross Motor Movements are Fluid and Coordinated  Yes    Heel Shin Test  coordinated bilaterally       Posture/Postural Control   Posture/Postural Control  No significant limitations      ROM / Strength   AROM / PROM / Strength  Strength      Strength   Strength Assessment Site  Hip;Knee;Ankle    Right/Left Hip  Right;Left    Right Hip Flexion  4-/5    Right Hip Extension  4-/5    Right Hip ABduction  4/5    Right Hip ADduction  4-/5    Left Hip Flexion  4-/5    Left Hip Extension  4-/5    Left Hip ABduction  4/5    Left  Hip ADduction  4-/5    Right/Left Knee  Right;Left    Right Knee Flexion  4/5    Right Knee Extension  4/5    Left Knee Flexion  4/5    Left Knee Extension  4/5    Right/Left Ankle  Right;Left    Right Ankle Dorsiflexion  4-/5    Right Ankle Plantar Flexion   4-/5    Left Ankle Dorsiflexion  4-/5    Left Ankle Plantar Flexion  4-/5      Transfers   Transfers  Sit to Stand;Stand to Sit    Sit to Stand  7: Independent    Five time sit to stand comments   14 seconds without UE support     Stand to Sit  6: Modified independent (Device/Increase time)      Ambulation/Gait   Ambulation/Gait  Yes    Ambulation/Gait Assistance  6: Modified independent (Device/Increase time);5: Supervision    Ambulation/Gait Assistance Details  decreased heel strike and single limb stance     Ambulation Distance (Feet)  441 Feet    Assistive device  Small based quad cane    Gait Pattern  Step-through pattern;Decreased stride length;Decreased hip/knee flexion - right;Decreased hip/knee flexion - left;Decreased dorsiflexion - right;Decreased dorsiflexion - left;Shuffle;Narrow base of support;Poor foot clearance - left;Poor foot clearance - right    Ambulation Surface  Level;Indoor    Gait velocity  .5335m/s      6 Minute Walk- Baseline   6 Minute Walk- Baseline  yes      6 minute walk test results    Aerobic Endurance Distance Walked  441      Balance   Balance Assessed  Yes      Standardized Balance Assessment   Standardized Balance Assessment  Berg Balance Test      Berg Balance Test   Sit to Stand  Able to stand without using hands and stabilize independently    Standing Unsupported  Able to stand safely 2 minutes    Sitting with Back Unsupported but Feet Supported on Floor or Stool  Able to sit safely and securely 2 minutes    Stand to Sit  Sits safely with minimal use of hands    Transfers  Able to transfer safely, minor use of hands    Standing Unsupported with Eyes Closed  Able to stand 10 seconds safely    Standing Ubsupported with Feet Together  Able to place feet together independently and stand 1 minute safely    From Standing, Reach Forward with Outstretched Arm  Can reach forward >12 cm safely (5")    From Standing Position, Pick up Object from  Floor  Able to pick up shoe, needs supervision    From Standing Position, Turn to Look Behind Over each Shoulder  Looks behind from both sides and weight shifts well    Turn 360 Degrees  Able to turn 360 degrees safely but slowly    Standing Unsupported, Alternately Place Feet on Step/Stool  Needs assistance to keep from falling or unable to try    Standing Unsupported, One Foot in Front  Able to take small step independently and hold 30 seconds    Standing on One Leg  Unable to try or needs assist to prevent fall    Total Score  42       Access Code: [email or web address]  URL: [email or web address]  Date: 10/27/2017  Prepared by: Precious BardMarina Kaylianna Detert   Exercises  Standing March with Counter Support -  10 reps - 2 sets - 5 hold - 1x daily - 7x weekly  Seated Heel Toe Raises - 10 reps - 2 sets - 5 hold - 1x daily - 7x weekly  Seated Hip Adduction Isometrics with Ball - 10 reps - 2 sets - 5 hold - 1x daily - 7x weekly  Tandem Stance with Chair Support - 10 reps - 2 sets - 5 hold - 1x daily - 7x weekly            Objective measurements completed on examination: See above findings.            PT Education - 10/27/17 1715    Education provided  Yes    Education Details  HEP, gait mechanics, POC    Person(s) Educated  Patient;Child(ren)    Methods  Explanation;Demonstration;Handout;Tactile cues;Verbal cues    Comprehension  Verbalized understanding;Returned demonstration;Need further instruction       PT Short Term Goals - 10/27/17 1719      PT SHORT TERM GOAL #1   Title  Patient will be independent in home exercise program to improve strength/mobility for better functional independence with ADLs.    Baseline  HEP given     Time  2    Period  Weeks    Status  New    Target Date  11/10/17      PT SHORT TERM GOAL #2   Title  Patient will tolerate 5 seconds of single leg stance without loss of balance to improve ability to get in and out of shower safely.    Baseline   unable to SLS    Time  2    Period  Weeks    Status  New    Target Date  11/10/17      PT SHORT TERM GOAL #3   Title  Patient will increase six minute walk test distance to 600 ft for progression to community ambulator and improve gait ability    Baseline  441 ft    Time  2    Period  Weeks    Status  New    Target Date  11/10/17      PT SHORT TERM GOAL #4   Title  Patient will increase BLE gross strength to 4+/5 as to improve functional strength for independent gait, increased standing tolerance and increased ADL ability.    Baseline  4-/5    Time  2    Period  Weeks    Status  New    Target Date  11/10/17        PT Long Term Goals - 10/27/17 1721      PT LONG TERM GOAL #1   Title  Patient will demonstrate an improved Berg Balance Score of >50/56 as to demonstrate improved balance with ADLs such as sitting/standing and transfer balance and reduced fall risk    Baseline  42/56    Time  8    Period  Weeks    Status  New    Target Date  12/22/17      PT LONG TERM GOAL #2   Title  Patient will increase 10 meter walk test to >1.95m/s as to improve gait speed for better community ambulation and to reduce fall risk    Baseline  .4m/s with quad cane    Time  8    Period  Weeks    Status  New    Target Date  12/22/17      PT  LONG TERM GOAL #3   Title  Patient will be modified independent in walking on even/uneven surface with least restrictive assistive device, for 20+ minutes without rest break, reporting some difficulty or less to improve walking tolerance with community ambulation including grocery shopping, going to church,etc.     Baseline  4/23: unable to walk that distance    Time  8    Period  Weeks    Status  New    Target Date  12/22/17      PT LONG TERM GOAL #4   Title  Patient will increase six minute walk test distance to >800 for progression to community ambulator and improve gait ability    Baseline  4/23: 441 ft    Time  8    Period  Weeks    Status   New    Target Date  12/22/17             Plan - 10/27/17 1716    Clinical Impression Statement  Patient is a pleasant 84 year old man who presents with gait disturbances secondary to CVA last year. Patient has not been compliant with HEP since d/c last year from physical therapy. Patient challenged with ambulatory speed, mechanics, and capacity for longer durations. 10MWT= .40m/s, 6 min walk test =441 ft, and patient demonstrates decrease heel strike bilaterally resulting in shuffling of bilateral LE with minimal/no clearance of bilateral LE with fatigue. BERT 42/56 due to patient's limited ability to perform SLS without losing balance. Patient will benefit from skilled physical therapy to improve gait mechanics, strength, and balance for return to PLOF.     History and Personal Factors relevant to plan of care:  This patient presents with 3, personal factors/ comorbidities, and 3 , body elements including body structures and functions, activity limitations and or participation restrictions. Patient's condition is evolving    Clinical Presentation  Evolving    Clinical Presentation due to:  decline in physical functioning     Clinical Decision Making  Moderate    Rehab Potential  Fair    Clinical Impairments Affecting Rehab Potential  (+) motivation, family support  (-) age, difficulty hearing/remembering    PT Frequency  2x / week    PT Duration  8 weeks    PT Treatment/Interventions  ADLs/Self Care Home Management;Electrical Stimulation;Therapeutic activities;Functional mobility training;Stair training;Gait training;DME Instruction;Therapeutic exercise;Balance training;Neuromuscular re-education;Patient/family education;Manual techniques;Passive range of motion;Energy conservation;Taping    PT Next Visit Plan  review HEP, ambulation, stairs    PT Home Exercise Plan  see sheet    Consulted and Agree with Plan of Care  Patient;Family member/caregiver    Family Member Consulted  son        Patient will benefit from skilled therapeutic intervention in order to improve the following deficits and impairments:  Abnormal gait, Decreased activity tolerance, Decreased balance, Decreased knowledge of precautions, Decreased endurance, Decreased knowledge of use of DME, Decreased mobility, Decreased safety awareness, Difficulty walking, Decreased strength, Improper body mechanics  Visit Diagnosis: Other abnormalities of gait and mobility  Muscle weakness (generalized)     Problem List Patient Active Problem List   Diagnosis Date Noted  . Hypertensive urgency 12/31/2016  . Stroke (HCC) 10/15/2016  . TIA (transient ischemic attack) 03/07/2016  . Acute CVA (cerebrovascular accident) (HCC) 07/03/2015   Precious Bard, PT, DPT   10/27/2017, 5:24 PM  [facility] MAIN [facility] SERVICES [address] [place], [place], [address] Phone: [phone]   Fax:  [phone]  Name: Montel Vanderhoof MRN: [id number] Date of Birth: Oct 22, 1925

## 2017-10-29 ENCOUNTER — Ambulatory Visit: Payer: Medicare Other

## 2017-11-05 ENCOUNTER — Ambulatory Visit: Payer: Medicare Other | Attending: Family Medicine

## 2017-11-05 VITALS — BP 144/67 | HR 67

## 2017-11-05 DIAGNOSIS — R269 Unspecified abnormalities of gait and mobility: Secondary | ICD-10-CM | POA: Diagnosis present

## 2017-11-05 DIAGNOSIS — R2681 Unsteadiness on feet: Secondary | ICD-10-CM | POA: Insufficient documentation

## 2017-11-05 DIAGNOSIS — M6281 Muscle weakness (generalized): Secondary | ICD-10-CM

## 2017-11-05 DIAGNOSIS — R2689 Other abnormalities of gait and mobility: Secondary | ICD-10-CM

## 2017-11-05 NOTE — Therapy (Signed)
[place] [facility] MAIN [facility] SERVICES [address] [place], [place], [address] Phone: [phone]   Fax:  [phone]  Physical Therapy Treatment  Patient Details  Name: Christopher [name] MRN: [id number] Date of Birth: 02/16/1926 Referring Provider: Dr. Hans Eden   Encounter Date: 11/05/2017  PT End of Session - 11/05/17 1552    Visit Number  2    Number of Visits  16    Date for PT Re-Evaluation  12/22/17    Authorization - Visit Number  2    Authorization - Number of Visits  10    PT Start Time  1555    PT Stop Time  1640    PT Time Calculation (min)  45 min    Equipment Utilized During Treatment  Gait belt    Activity Tolerance  Patient tolerated treatment well    Behavior During Therapy  [facility] for tasks assessed/performed       Past Medical History:  Diagnosis Date  . Arthritis   . Ataxia   . Diabetes mellitus without complication (HCC)    controlled, pt checks it in the morning   . Dyslipidemia   . Hypertension    somewhat controlled, pt checks every morning, he reports it has been good.  . Stroke (HCC)   . TIA (transient ischemic attack)     Past Surgical History:  Procedure Laterality Date  . none      Vitals:   11/05/17 1600  BP: (!) 144/67  Pulse: 67  SpO2: 99%    Subjective Assessment - 11/05/17 1551    Subjective  Pt reports that he is doing well on this date. He denies any pain currently. He is performing HEP without issue. No specific questions or concerns currently.     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 2017, and CVA Dec 2016. Was seen at this facility by a different therapist until 03/2017. Patient reports that he was doing his exercises at home when his nurse would come. Now he reports he does not have a nurse coming to his house and does not do his exercises.  Reports that he  is not walking as fast now as he was able to before or for as long of distances. Reports no falls.     Limitations  Walking;House hold activities;Other (comment)    How long can you sit comfortably?  n/a    How long can you stand comfortably?  n/a    How long can you walk comfortably?  across his house     Diagnostic tests  MR 6/27: IMPRESSION: No acute intracranial abnormality identified. Small chronic infarcts in basal ganglia, left paramedian pons,and right frontal subcortical white matter. Stable moderate chronic microvascular ischemic changes and mild parenchymal volume loss of the brain. Single interval punctate focus of microhemorrhage and left brachium pontis. Additional scattered stable chronic foci of microhemorrhage with central predominance, likely related to chronic hypertension.    Patient Stated Goals  Increase gait speed and walk for longer    Currently in Pain?  No/denies         TREATMENT  Ther-ex  NuStep L2 x 6 minutes (unbilled)Pt  Seated heel/toe raises x 10 bilateral; Seated adductor isometric ball squeeze x 10; Sit to stand without UE support x 10; Sit to stand without UE support with 2kg  medicine ball x 10; Step-ups to 6" step alternating LE x 10 each side; Quantum leg press 105# x 15, 135# x 15, 150# x 15;   Neuromuscular Re-education  Standing marching without UE support but with hands floating over rails for safety x 10 bilateral; Tandem stand without UE support 30s hold total x 2 on each side; Airex toe taps to 6" step alternating LE x 10 each; Airex balance with feet together with horizontal and then vertical head turns x multiple bouts each;                    PT Education - 11/05/17 1552    Education provided  Yes    Education Details  HEP and plan of care    Person(s) Educated  Patient;Child(ren)    Methods  Explanation    Comprehension  Verbalized understanding       PT Short Term Goals - 10/27/17 1719      PT SHORT TERM GOAL  #1   Title  Patient will be independent in home exercise program to improve strength/mobility for better functional independence with ADLs.    Baseline  HEP given     Time  2    Period  Weeks    Status  New    Target Date  11/10/17      PT SHORT TERM GOAL #2   Title  Patient will tolerate 5 seconds of single leg stance without loss of balance to improve ability to get in and out of shower safely.    Baseline  unable to SLS    Time  2    Period  Weeks    Status  New    Target Date  11/10/17      PT SHORT TERM GOAL #3   Title  Patient will increase six minute walk test distance to 600 ft for progression to community ambulator and improve gait ability    Baseline  441 ft    Time  2    Period  Weeks    Status  New    Target Date  11/10/17      PT SHORT TERM GOAL #4   Title  Patient will increase BLE gross strength to 4+/5 as to improve functional strength for independent gait, increased standing tolerance and increased ADL ability.    Baseline  4-/5    Time  2    Period  Weeks    Status  New    Target Date  11/10/17        PT Long Term Goals - 10/27/17 1721      PT LONG TERM GOAL #1   Title  Patient will demonstrate an improved Berg Balance Score of >50/56 as to demonstrate improved balance with ADLs such as sitting/standing and transfer balance and reduced fall risk    Baseline  42/56    Time  8    Period  Weeks    Status  New    Target Date  12/22/17      PT LONG TERM GOAL #2   Title  Patient will increase 10 meter walk test to >1.23m/s as to improve gait speed for better community ambulation and to reduce fall risk    Baseline  .18m/s with quad cane    Time  8    Period  Weeks    Status  New    Target Date  12/22/17      PT LONG TERM GOAL #3  Title  Patient will be modified independent in walking on even/uneven surface with least restrictive assistive device, for 20+ minutes without rest break, reporting some difficulty or less to improve walking tolerance with  community ambulation including grocery shopping, going to church,etc.     Baseline  4/23: unable to walk that distance    Time  8    Period  Weeks    Status  New    Target Date  12/22/17      PT LONG TERM GOAL #4   Title  Patient will increase six minute walk test distance to >800 for progression to community ambulator and improve gait ability    Baseline  4/23: 441 ft    Time  8    Period  Weeks    Status  New    Target Date  12/22/17            Plan - 11/05/17 1552    Clinical Impression Statement  Demonstrates good performance with his home exercise routine. He demonstrates good stability on firm surfaces but struggles with head turns on Airex pad. Pt is able to increase leg press to 150# and complete 15 reps without overt muscle failure. Will continue to progress resistance as pt is able to find appropriate resistance to use. Pt encouraged to continue HEP and follow-up as scheduled. Will consider progressing HEP at next session.     Rehab Potential  Fair    Clinical Impairments Affecting Rehab Potential  (+) motivation, family support  (-) age, difficulty hearing/remembering    PT Frequency  2x / week    PT Duration  8 weeks    PT Treatment/Interventions  ADLs/Self Care Home Management;Electrical Stimulation;Therapeutic activities;Functional mobility training;Stair training;Gait training;DME Instruction;Therapeutic exercise;Balance training;Neuromuscular re-education;Patient/family education;Manual techniques;Passive range of motion;Energy conservation;Taping    PT Next Visit Plan  review HEP and consider progression, ambulation, stairs, increase leg press resistance or consider single leg    PT Home Exercise Plan  see sheet    Consulted and Agree with Plan of Care  Patient;Family member/caregiver    Family Member Consulted  son       Patient will benefit from skilled therapeutic intervention in order to improve the following deficits and impairments:  Abnormal gait, Decreased  activity tolerance, Decreased balance, Decreased knowledge of precautions, Decreased endurance, Decreased knowledge of use of DME, Decreased mobility, Decreased safety awareness, Difficulty walking, Decreased strength, Improper body mechanics  Visit Diagnosis: Other abnormalities of gait and mobility  Muscle weakness (generalized)     Problem List Patient Active Problem List   Diagnosis Date Noted  . Hypertensive urgency 12/31/2016  . Stroke (HCC) 10/15/2016  . TIA (transient ischemic attack) 03/07/2016  . Acute CVA (cerebrovascular accident) (HCC) 07/03/2015   Christopher [name] PT, DPT   Alonnie Bieker 11/05/2017, 5:04 PM  [facility] MAIN [facility] SERVICES [address] [place], [place], [address] Phone: [phone]   Fax:  [phone]  Name: Redding Cloe MRN: [id number] Date of Birth: 1926-03-31

## 2017-11-10 ENCOUNTER — Ambulatory Visit: Payer: Medicare Other

## 2017-11-12 ENCOUNTER — Ambulatory Visit: Payer: Medicare Other

## 2017-11-12 DIAGNOSIS — M6281 Muscle weakness (generalized): Secondary | ICD-10-CM

## 2017-11-12 DIAGNOSIS — R2689 Other abnormalities of gait and mobility: Secondary | ICD-10-CM | POA: Diagnosis not present

## 2017-11-12 NOTE — Therapy (Signed)
[place] [facility] MAIN [facility] SERVICES [address] [place], [place], [address] Phone: [phone]   Fax:  [phone]  Physical Therapy Treatment  Patient Details  Name: Christopher [name] MRN: [id number] Date of Birth: Oct 14, 1925 Referring Provider: Dr. Hans Eden   Encounter Date: 11/12/2017  PT End of Session - 11/12/17 1651    Visit Number  3    Number of Visits  16    Date for PT Re-Evaluation  12/22/17    PT Start Time  1610    PT Stop Time  1652    PT Time Calculation (min)  42 min    Equipment Utilized During Treatment  Gait belt    Activity Tolerance  Patient tolerated treatment well    Behavior During Therapy  [facility] for tasks assessed/performed       Past Medical History:  Diagnosis Date  . Arthritis   . Ataxia   . Diabetes mellitus without complication (HCC)    controlled, pt checks it in the morning   . Dyslipidemia   . Hypertension    somewhat controlled, pt checks every morning, he reports it has been good.  . Stroke (HCC)   . TIA (transient ischemic attack)     Past Surgical History:  Procedure Laterality Date  . none      There were no vitals filed for this visit.  Subjective Assessment - 11/12/17 1650    Subjective  Pt doing well today. Reports feelign ok today. Not been using SBQC in home, but walking around most of day adn practicing stairs in home.     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 2017, and CVA Dec 2016. Was seen at this facility by a different therapist until 03/2017. Patient reports that he was doing his exercises at home when his nurse would come. Now he reports he does not have a nurse coming to his house and does not do his exercises.  Reports that he is not walking as fast now as he was able to before or for as long of distances. Reports no falls.     Currently in Pain?  No/denies            TREATMENT THIS  SESSION   Neuromuscular Reeducation. -NuStep L2 x 3 minutes, seat level 7, arms 8, MinGuard assist for stand pivot to NU from transport chair. -4x33M AMB s AD, supervision to MinGuard Assist: 0.85m/s  -2x33M retroAMB at MinGuard Assist -33M right lateral side stepping -33M left lateral side stepping - Sit to stand without UE support with 2kg medicine ball 1x10 firm, 1x10 airex foam -Airex toe taps to 6" step alternating LE x 10 each; -Airex overhead reach with kickball in narrow stance 1x15 -airex narrow stance 90 degree trunk rotation with kickball -arex narrow stance kickball rebounding off closet door 1x15  -2x4 balance beam in // brs 2 round trips; 1x with single hand hold, 1x wth 2 finger assist - Quantum leg press 150# x 15;       PT Short Term Goals - 10/27/17 1719      PT SHORT TERM GOAL #1   Title  Patient will be independent in home exercise program to improve strength/mobility for better functional independence with ADLs.    Baseline  HEP given     Time  2    Period  Weeks    Status  New  Target Date  11/10/17      PT SHORT TERM GOAL #2   Title  Patient will tolerate 5 seconds of single leg stance without loss of balance to improve ability to get in and out of shower safely.    Baseline  unable to SLS    Time  2    Period  Weeks    Status  New    Target Date  11/10/17      PT SHORT TERM GOAL #3   Title  Patient will increase six minute walk test distance to 600 ft for progression to community ambulator and improve gait ability    Baseline  441 ft    Time  2    Period  Weeks    Status  New    Target Date  11/10/17      PT SHORT TERM GOAL #4   Title  Patient will increase BLE gross strength to 4+/5 as to improve functional strength for independent gait, increased standing tolerance and increased ADL ability.    Baseline  4-/5    Time  2    Period  Weeks    Status  New    Target Date  11/10/17        PT Long Term Goals - 10/27/17 1721      PT LONG  TERM GOAL #1   Title  Patient will demonstrate an improved Berg Balance Score of >50/56 as to demonstrate improved balance with ADLs such as sitting/standing and transfer balance and reduced fall risk    Baseline  42/56    Time  8    Period  Weeks    Status  New    Target Date  12/22/17      PT LONG TERM GOAL #2   Title  Patient will increase 10 meter walk test to >1.22m/s as to improve gait speed for better community ambulation and to reduce fall risk    Baseline  .31m/s with quad cane    Time  8    Period  Weeks    Status  New    Target Date  12/22/17      PT LONG TERM GOAL #3   Title  Patient will be modified independent in walking on even/uneven surface with least restrictive assistive device, for 20+ minutes without rest break, reporting some difficulty or less to improve walking tolerance with community ambulation including grocery shopping, going to church,etc.     Baseline  4/23: unable to walk that distance    Time  8    Period  Weeks    Status  New    Target Date  12/22/17      PT LONG TERM GOAL #4   Title  Patient will increase six minute walk test distance to >800 for progression to community ambulator and improve gait ability    Baseline  4/23: 441 ft    Time  8    Period  Weeks    Status  New    Target Date  12/22/17            Plan - 11/12/17 1652    Clinical Impression Statement  Continued to progress dynamic balance actvities this session to meet patient at his limitations. Added in multi rectional walking, and more variety in dynamic balance activity on airex foamin narrow starnce. LOB x5 in session requrin gminA for corrrection. Pt remains very motivated. AMB remains slow but steady at supervision level.  Rehab Potential  Fair    Clinical Impairments Affecting Rehab Potential  (+) motivation, family support  (-) age, difficulty hearing/remembering    PT Frequency  2x / week    PT Duration  8 weeks    PT Treatment/Interventions  ADLs/Self Care Home  Management;Electrical Stimulation;Therapeutic activities;Functional mobility training;Stair training;Gait training;DME Instruction;Therapeutic exercise;Balance training;Neuromuscular re-education;Patient/family education;Manual techniques;Passive range of motion;Energy conservation;Taping    PT Next Visit Plan  conitnue progression of ambulation, stairs, increase leg press resistance or consider single leg; add in dual tasking elements     PT Home Exercise Plan  see sheet    Consulted and Agree with Plan of Care  Patient       Patient will benefit from skilled therapeutic intervention in order to improve the following deficits and impairments:  Abnormal gait, Decreased activity tolerance, Decreased balance, Decreased knowledge of precautions, Decreased endurance, Decreased knowledge of use of DME, Decreased mobility, Decreased safety awareness, Difficulty walking, Decreased strength, Improper body mechanics  Visit Diagnosis: Muscle weakness (generalized)     Problem List Patient Active Problem List   Diagnosis Date Noted  . Hypertensive urgency 12/31/2016  . Stroke (HCC) 10/15/2016  . TIA (transient ischemic attack) 03/07/2016  . Acute CVA (cerebrovascular accident) (HCC) 07/03/2015    4:58 PM, 11/12/17 Rosamaria Lints, PT, DPT Physical Therapist - [facility]  [phone]  [place] 11/12/2017, 4:57 PM  [place] [facility] MAIN [facility] SERVICES [address] [place], [place], [address] Phone: [phone]   Fax:  [phone]  Name: Christopher [name] MRN: [id number] Date of Birth: 05-10-1926

## 2017-11-16 ENCOUNTER — Ambulatory Visit: Payer: Medicare Other

## 2017-11-16 DIAGNOSIS — R2689 Other abnormalities of gait and mobility: Secondary | ICD-10-CM

## 2017-11-16 DIAGNOSIS — M6281 Muscle weakness (generalized): Secondary | ICD-10-CM

## 2017-11-16 DIAGNOSIS — R2681 Unsteadiness on feet: Secondary | ICD-10-CM

## 2017-11-16 NOTE — Therapy (Signed)
[place] [facility] MAIN [facility] SERVICES [address] [place], [place], [address] Phone: [phone]   Fax:  [phone]  Physical Therapy Treatment  Patient Details  Name: Christopher [name] MRN: [id number] Date of Birth: 05/05/1926 Referring Provider: Dr. Hans Eden   Encounter Date: 11/16/2017  PT End of Session - 11/16/17 1803    Visit Number  4    Number of Visits  16    Date for PT Re-Evaluation  12/22/17    Authorization Type  4/10    PT Start Time  1610    PT Stop Time  1645    PT Time Calculation (min)  35 min    Equipment Utilized During Treatment  Gait belt    Activity Tolerance  Patient tolerated treatment well    Behavior During Therapy  [facility] for tasks assessed/performed       Past Medical History:  Diagnosis Date  . Arthritis   . Ataxia   . Diabetes mellitus without complication (HCC)    controlled, pt checks it in the morning   . Dyslipidemia   . Hypertension    somewhat controlled, pt checks every morning, he reports it has been good.  . Stroke (HCC)   . TIA (transient ischemic attack)     Past Surgical History:  Procedure Laterality Date  . none      There were no vitals filed for this visit.  Subjective Assessment - 11/16/17 1614    Subjective  Patient reports no falls or LOB since lsat session. Has not been compliant with HEP but walking around at home, no pain or concerns .    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 2017, and CVA Dec 2016. Was seen at this facility by a different therapist until 03/2017. Patient reports that he was doing his exercises at home when his nurse would come. Now he reports he does not have a nurse coming to his house and does not do his exercises.  Reports that he is not walking as fast now as he was able to before or for as long of distances. Reports no falls.     Limitations  Walking;House hold  activities;Other (comment)    How long can you walk comfortably?  across his house     Diagnostic tests  MR 6/27: IMPRESSION: No acute intracranial abnormality identified. Small chronic infarcts in basal ganglia, left paramedian pons,and right frontal subcortical white matter. Stable moderate chronic microvascular ischemic changes and mild parenchymal volume loss of the brain. Single interval punctate focus of microhemorrhage and left brachium pontis. Additional scattered stable chronic foci of microhemorrhage with central predominance, likely related to chronic hypertension.    Currently in Pain?  No/denies       -Airex toe taps to 6" step alternating LE x 10 each; -Airex side step toe taps 6" steps 10x each LE -Airex horizontal head turns 2x30 seconds -Airex pad reach to place balls on different rungs of tree -balance beam in // bars 4x length of bars side stepping, 4x length of bars forward stepping: SUE support Step over and back orange hurdle single UE support 12x each LE. Challenged with RLE with more frequent knocking over of hurdle  Side step over orange hurdle and back BUE support 12x each leg   - Quantum leg press 150# x 15;   Quntum leg press single leg 75 2x10 each leg  Pt. response to medical necessity:  Patient will continue to benefit from skilled physical therapy to improve gait mechanics, strength, and balance for return to PLOF.                        PT Education - 11/16/17 1803    Education provided  Yes    Education Details  HEP compliance, exercise technique    Person(s) Educated  Patient;Child(ren)    Methods  Explanation;Demonstration;Verbal cues    Comprehension  Verbalized understanding;Returned demonstration;Need further instruction       PT Short Term Goals - 10/27/17 1719      PT SHORT TERM GOAL #1   Title  Patient will be independent in home exercise program to improve strength/mobility for better functional independence with  ADLs.    Baseline  HEP given     Time  2    Period  Weeks    Status  New    Target Date  11/10/17      PT SHORT TERM GOAL #2   Title  Patient will tolerate 5 seconds of single leg stance without loss of balance to improve ability to get in and out of shower safely.    Baseline  unable to SLS    Time  2    Period  Weeks    Status  New    Target Date  11/10/17      PT SHORT TERM GOAL #3   Title  Patient will increase six minute walk test distance to 600 ft for progression to community ambulator and improve gait ability    Baseline  441 ft    Time  2    Period  Weeks    Status  New    Target Date  11/10/17      PT SHORT TERM GOAL #4   Title  Patient will increase BLE gross strength to 4+/5 as to improve functional strength for independent gait, increased standing tolerance and increased ADL ability.    Baseline  4-/5    Time  2    Period  Weeks    Status  New    Target Date  11/10/17        PT Long Term Goals - 10/27/17 1721      PT LONG TERM GOAL #1   Title  Patient will demonstrate an improved Berg Balance Score of >50/56 as to demonstrate improved balance with ADLs such as sitting/standing and transfer balance and reduced fall risk    Baseline  42/56    Time  8    Period  Weeks    Status  New    Target Date  12/22/17      PT LONG TERM GOAL #2   Title  Patient will increase 10 meter walk test to >1.38m/s as to improve gait speed for better community ambulation and to reduce fall risk    Baseline  .25m/s with quad cane    Time  8    Period  Weeks    Status  New    Target Date  12/22/17      PT LONG TERM GOAL #3   Title  Patient will be modified independent in walking on even/uneven surface with least restrictive assistive device, for 20+ minutes without rest break, reporting some difficulty or less to improve walking tolerance with community ambulation including grocery shopping, going to church,etc.     Baseline  4/23: unable to walk that distance  Time  8     Period  Weeks    Status  New    Target Date  12/22/17      PT LONG TERM GOAL #4   Title  Patient will increase six minute walk test distance to >800 for progression to community ambulator and improve gait ability    Baseline  4/23: 441 ft    Time  8    Period  Weeks    Status  New    Target Date  12/22/17            Plan - 11/16/17 1805    Clinical Impression Statement  Patient is challenged in single limb stance on RLE with need for UE support when performing dynamic interventions. Frequent posterior lean resulted in near LOB throughout session when on unstable surface. Patient challenged with single limb strengthening interventions due to curbing compensatory patterning. Patient will continue to benefit from skilled physical therapy to improve gait mechanics, strength, and balance for return to PLOF.     Rehab Potential  Fair    Clinical Impairments Affecting Rehab Potential  (+) motivation, family support  (-) age, difficulty hearing/remembering    PT Frequency  2x / week    PT Duration  8 weeks    PT Treatment/Interventions  ADLs/Self Care Home Management;Electrical Stimulation;Therapeutic activities;Functional mobility training;Stair training;Gait training;DME Instruction;Therapeutic exercise;Balance training;Neuromuscular re-education;Patient/family education;Manual techniques;Passive range of motion;Energy conservation;Taping    PT Next Visit Plan  conitnue progression of ambulation, stairs, increase leg press resistance or consider single leg; add in dual tasking elements     PT Home Exercise Plan  see sheet    Consulted and Agree with Plan of Care  Patient       Patient will benefit from skilled therapeutic intervention in order to improve the following deficits and impairments:  Abnormal gait, Decreased activity tolerance, Decreased balance, Decreased knowledge of precautions, Decreased endurance, Decreased knowledge of use of DME, Decreased mobility, Decreased safety  awareness, Difficulty walking, Decreased strength, Improper body mechanics  Visit Diagnosis: Muscle weakness (generalized)  Other abnormalities of gait and mobility  Unsteadiness on feet     Problem List Patient Active Problem List   Diagnosis Date Noted  . Hypertensive urgency 12/31/2016  . Stroke (HCC) 10/15/2016  . TIA (transient ischemic attack) 03/07/2016  . Acute CVA (cerebrovascular accident) (HCC) 07/03/2015   Precious Bard, PT, DPT   11/16/2017, 6:06 PM  [place] [facility] MAIN [facility] SERVICES [address] [place], [place], [address] Phone: [phone]   Fax:  [phone]  Name: Christopher [name] MRN: [id number] Date of Birth: 1926/01/25

## 2017-11-18 ENCOUNTER — Ambulatory Visit: Payer: Medicare Other

## 2017-11-18 DIAGNOSIS — R2689 Other abnormalities of gait and mobility: Secondary | ICD-10-CM | POA: Diagnosis not present

## 2017-11-18 DIAGNOSIS — R2681 Unsteadiness on feet: Secondary | ICD-10-CM

## 2017-11-18 DIAGNOSIS — M6281 Muscle weakness (generalized): Secondary | ICD-10-CM

## 2017-11-18 NOTE — Therapy (Signed)
[facility] MAIN [facility] SERVICES [address] [place], [place], [address] Phone: [phone]   Fax:  [phone]  Physical Therapy Treatment  Patient Details  Name: Christopher [name] MRN: [id number] Date of Birth: 06/18/1926 Referring Provider: Dr. Hans Eden   Encounter Date: 11/18/2017  PT End of Session - 11/18/17 1609    Visit Number  5    Number of Visits  16    Date for PT Re-Evaluation  12/22/17    Authorization Type  5/10    PT Start Time  1603    PT Stop Time  1645    PT Time Calculation (min)  42 min    Equipment Utilized During Treatment  Gait belt    Activity Tolerance  Patient tolerated treatment well    Behavior During Therapy  [facility] for tasks assessed/performed       Past Medical History:  Diagnosis Date  . Arthritis   . Ataxia   . Diabetes mellitus without complication (HCC)    controlled, pt checks it in the morning   . Dyslipidemia   . Hypertension    somewhat controlled, pt checks every morning, he reports it has been good.  . Stroke (HCC)   . TIA (transient ischemic attack)     Past Surgical History:  Procedure Laterality Date  . none      There were no vitals filed for this visit.  Subjective Assessment - 11/18/17 1607    Subjective  Patient presents with son today. Reports no falls or LOB. Was compliant with HEP yesterday.     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 2017, and CVA Dec 2016. Was seen at this facility by a different therapist until 03/2017. Patient reports that he was doing his exercises at home when his nurse would come. Now he reports he does not have a nurse coming to his house and does not do his exercises.  Reports that he is not walking as fast now as he was able to before or for as long of distances. Reports no falls.     Limitations  Walking;House hold activities;Other (comment)    How long can you  walk comfortably?  across his house     Diagnostic tests  MR 6/27: IMPRESSION: No acute intracranial abnormality identified. Small chronic infarcts in basal ganglia, left paramedian pons,and right frontal subcortical white matter. Stable moderate chronic microvascular ischemic changes and mild parenchymal volume loss of the brain. Single interval punctate focus of microhemorrhage and left brachium pontis. Additional scattered stable chronic foci of microhemorrhage with central predominance, likely related to chronic hypertension.    Currently in Pain?  No/denies     Nustep Lvl 4 4 minutes   Airex pad: throw balls into basketball hoop no UE support    Airex pad: balloon taps with son with PT holding on Step over and back orange hurdle single UE support 12x each LE. Challenged with RLE with more frequent knocking over of hurdle  Side step over orange hurdle and back BUE support 12x each leg    Resisted walking , 3x forward, 3x side (each side), 3x backwards. #7.5  - Quantum leg press 150# x 2x10;   Quntum leg press single leg 75 2x10 each leg     Pt. response to medical necessity:  Patient will continue to benefit from skilled physical therapy to improve gait mechanics, strength,  and balance for return to PLOF.                         PT Education - 11/18/17 1608    Education provided  Yes    Education Details  exercise technique     Person(s) Educated  Patient    Methods  Explanation;Demonstration;Verbal cues    Comprehension  Verbalized understanding;Returned demonstration       PT Short Term Goals - 10/27/17 1719      PT SHORT TERM GOAL #1   Title  Patient will be independent in home exercise program to improve strength/mobility for better functional independence with ADLs.    Baseline  HEP given     Time  2    Period  Weeks    Status  New    Target Date  11/10/17      PT SHORT TERM GOAL #2   Title  Patient will tolerate 5 seconds of single leg stance  without loss of balance to improve ability to get in and out of shower safely.    Baseline  unable to SLS    Time  2    Period  Weeks    Status  New    Target Date  11/10/17      PT SHORT TERM GOAL #3   Title  Patient will increase six minute walk test distance to 600 ft for progression to community ambulator and improve gait ability    Baseline  441 ft    Time  2    Period  Weeks    Status  New    Target Date  11/10/17      PT SHORT TERM GOAL #4   Title  Patient will increase BLE gross strength to 4+/5 as to improve functional strength for independent gait, increased standing tolerance and increased ADL ability.    Baseline  4-/5    Time  2    Period  Weeks    Status  New    Target Date  11/10/17        PT Long Term Goals - 10/27/17 1721      PT LONG TERM GOAL #1   Title  Patient will demonstrate an improved Berg Balance Score of >50/56 as to demonstrate improved balance with ADLs such as sitting/standing and transfer balance and reduced fall risk    Baseline  42/56    Time  8    Period  Weeks    Status  New    Target Date  12/22/17      PT LONG TERM GOAL #2   Title  Patient will increase 10 meter walk test to >1.103m/s as to improve gait speed for better community ambulation and to reduce fall risk    Baseline  .70m/s with quad cane    Time  8    Period  Weeks    Status  New    Target Date  12/22/17      PT LONG TERM GOAL #3   Title  Patient will be modified independent in walking on even/uneven surface with least restrictive assistive device, for 20+ minutes without rest break, reporting some difficulty or less to improve walking tolerance with community ambulation including grocery shopping, going to church,etc.     Baseline  4/23: unable to walk that distance    Time  8    Period  Weeks    Status  New    Target  Date  12/22/17      PT LONG TERM GOAL #4   Title  Patient will increase six minute walk test distance to >800 for progression to community ambulator and  improve gait ability    Baseline  4/23: 441 ft    Time  8    Period  Weeks    Status  New    Target Date  12/22/17            Plan - 11/18/17 1742    Clinical Impression Statement  Patient demonstrated ability to reach within and outside of BOS on unstable surface with occasional LOB and required CGA. Resisted walking resulted in two LOB when performed side stepping.  Patient will continue to benefit from skilled physical therapy to improve gait mechanics, strength, and balance for return to PLOF.     Rehab Potential  Fair    Clinical Impairments Affecting Rehab Potential  (+) motivation, family support  (-) age, difficulty hearing/remembering    PT Frequency  2x / week    PT Duration  8 weeks    PT Treatment/Interventions  ADLs/Self Care Home Management;Electrical Stimulation;Therapeutic activities;Functional mobility training;Stair training;Gait training;DME Instruction;Therapeutic exercise;Balance training;Neuromuscular re-education;Patient/family education;Manual techniques;Passive range of motion;Energy conservation;Taping    PT Next Visit Plan  conitnue progression of ambulation, stairs, increase leg press resistance or consider single leg; add in dual tasking elements     PT Home Exercise Plan  see sheet    Consulted and Agree with Plan of Care  Patient       Patient will benefit from skilled therapeutic intervention in order to improve the following deficits and impairments:  Abnormal gait, Decreased activity tolerance, Decreased balance, Decreased knowledge of precautions, Decreased endurance, Decreased knowledge of use of DME, Decreased mobility, Decreased safety awareness, Difficulty walking, Decreased strength, Improper body mechanics  Visit Diagnosis: Muscle weakness (generalized)  Other abnormalities of gait and mobility  Unsteadiness on feet     Problem List Patient Active Problem List   Diagnosis Date Noted  . Hypertensive urgency 12/31/2016  . Stroke (HCC)  10/15/2016  . TIA (transient ischemic attack) 03/07/2016  . Acute CVA (cerebrovascular accident) (HCC) 07/03/2015  Precious Bard, PT, DPT   11/18/2017, 5:42 PM  [place] [facility] MAIN [facility] SERVICES [address] [place], [place], [address] Phone: [phone]   Fax:  [phone]  Name: Christopher [name] MRN: [id number] Date of Birth: [birth year]

## 2017-11-23 ENCOUNTER — Ambulatory Visit: Payer: Medicare Other

## 2017-11-23 DIAGNOSIS — R2681 Unsteadiness on feet: Secondary | ICD-10-CM

## 2017-11-23 DIAGNOSIS — R2689 Other abnormalities of gait and mobility: Secondary | ICD-10-CM | POA: Diagnosis not present

## 2017-11-23 DIAGNOSIS — R269 Unspecified abnormalities of gait and mobility: Secondary | ICD-10-CM

## 2017-11-23 DIAGNOSIS — M6281 Muscle weakness (generalized): Secondary | ICD-10-CM

## 2017-11-23 NOTE — Therapy (Signed)
[facility] MAIN [facility] SERVICES [address] [place], [place], [address] Phone: [phone]   Fax:  [phone]  Physical Therapy Treatment  Patient Details  Name: Christopher [name] MRN: [id number] Date of Birth: April 30, 1926 Referring Provider: Dr. Hans Eden   Encounter Date: 11/23/2017  PT End of Session - 11/23/17 1606    Visit Number  6    Number of Visits  16    Date for PT Re-Evaluation  12/22/17    Authorization Type  6/10    PT Start Time  1600    PT Stop Time  1644    PT Time Calculation (min)  44 min    Equipment Utilized During Treatment  Gait belt    Activity Tolerance  Patient tolerated treatment well    Behavior During Therapy  [facility] for tasks assessed/performed       Past Medical History:  Diagnosis Date  . Arthritis   . Ataxia   . Diabetes mellitus without complication (HCC)    controlled, pt checks it in the morning   . Dyslipidemia   . Hypertension    somewhat controlled, pt checks every morning, he reports it has been good.  . Stroke (HCC)   . TIA (transient ischemic attack)     Past Surgical History:  Procedure Laterality Date  . none      There were no vitals filed for this visit.  Subjective Assessment - 11/23/17 1604    Subjective  Patient presents by himself today. Reports no pain, feels 50 50 today. Non compliant with HEP over the weekend.     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 2017, and CVA Dec 2016. Was seen at this facility by a different therapist until 03/2017. Patient reports that he was doing his exercises at home when his nurse would come. Now he reports he does not have a nurse coming to his house and does not do his exercises.  Reports that he is not walking as fast now as he was able to before or for as long of distances. Reports no falls.     Limitations  Walking;House hold activities;Other (comment)    How long can you walk comfortably?  across his house     Diagnostic tests  MR 6/27: IMPRESSION: No acute intracranial abnormality identified. Small chronic infarcts in basal ganglia, left paramedian pons,and right frontal subcortical white matter. Stable moderate chronic microvascular ischemic changes and mild parenchymal volume loss of the brain. Single interval punctate focus of microhemorrhage and left brachium pontis. Additional scattered stable chronic foci of microhemorrhage with central predominance, likely related to chronic hypertension.    Currently in Pain?  No/denies       Nustep Lvl 4 4 minutes    Airex pad: throw balls into basketball hoop no UE support    Speed ladder: one foot in each box in // bars   Airex pad: eyes closed 30 seconds x 2 trials  Step over and back orange hurdle single UE support 12x each LE. Challenged with RLE with more frequent knocking over of hurdle   Side step over orange hurdle and back BUE support 12x each leg    Resisted walking , 3x forward, 2x side (each side), . #7.5   Ankle weights: 5lb  Standing marches 20x   Standing hip extension 15x each leg   Seated LAQ 10x   -  Quantum leg press 150# x 2x10;   [place] leg press single leg 75 2x10 each leg     Pt. response to medical necessity:  Patient will continue to benefit from skilled physical therapy to improve gait mechanics, strength, and balance for return to PLOF.                              PT Education - 11/23/17 1605    Education provided  Yes    Education Details  exercise technique     Person(s) Educated  Patient    Methods  Explanation;Demonstration;Verbal cues    Comprehension  Verbalized understanding;Returned demonstration       PT Short Term Goals - 10/27/17 1719      PT SHORT TERM GOAL #1   Title  Patient will be independent in home exercise program to improve strength/mobility for better functional independence with ADLs.    Baseline  HEP  given     Time  2    Period  Weeks    Status  New    Target Date  11/10/17      PT SHORT TERM GOAL #2   Title  Patient will tolerate 5 seconds of single leg stance without loss of balance to improve ability to get in and out of shower safely.    Baseline  unable to SLS    Time  2    Period  Weeks    Status  New    Target Date  11/10/17      PT SHORT TERM GOAL #3   Title  Patient will increase six minute walk test distance to 600 ft for progression to community ambulator and improve gait ability    Baseline  441 ft    Time  2    Period  Weeks    Status  New    Target Date  11/10/17      PT SHORT TERM GOAL #4   Title  Patient will increase BLE gross strength to 4+/5 as to improve functional strength for independent gait, increased standing tolerance and increased ADL ability.    Baseline  4-/5    Time  2    Period  Weeks    Status  New    Target Date  11/10/17        PT Long Term Goals - 10/27/17 1721      PT LONG TERM GOAL #1   Title  Patient will demonstrate an improved Berg Balance Score of >50/56 as to demonstrate improved balance with ADLs such as sitting/standing and transfer balance and reduced fall risk    Baseline  42/56    Time  8    Period  Weeks    Status  New    Target Date  12/22/17      PT LONG TERM GOAL #2   Title  Patient will increase 10 meter walk test to >1.26m/s as to improve gait speed for better community ambulation and to reduce fall risk    Baseline  .60m/s with quad cane    Time  8    Period  Weeks    Status  New    Target Date  12/22/17      PT LONG TERM GOAL #3   Title  Patient will be modified independent in walking on even/uneven surface with least restrictive assistive device, for 20+ minutes without rest break, reporting some difficulty or less to  improve walking tolerance with community ambulation including grocery shopping, going to church,etc.     Baseline  4/23: unable to walk that distance    Time  8    Period  Weeks    Status   New    Target Date  12/22/17      PT LONG TERM GOAL #4   Title  Patient will increase six minute walk test distance to >800 for progression to community ambulator and improve gait ability    Baseline  4/23: 441 ft    Time  8    Period  Weeks    Status  New    Target Date  12/22/17            Plan - 11/23/17 1637    Clinical Impression Statement  Patient continues to be challenged by resisted walking with decreased stability on RLE. Patient fatigues with exercise requiring seated rest breaks with RLE fatiguing quicker than LLE. Patient will continue to benefit from skilled physical therapy to improve gait mechanics, strength, and balance for return to PLOF.     Rehab Potential  Fair    Clinical Impairments Affecting Rehab Potential  (+) motivation, family support  (-) age, difficulty hearing/remembering    PT Frequency  2x / week    PT Duration  8 weeks    PT Treatment/Interventions  ADLs/Self Care Home Management;Electrical Stimulation;Therapeutic activities;Functional mobility training;Stair training;Gait training;DME Instruction;Therapeutic exercise;Balance training;Neuromuscular re-education;Patient/family education;Manual techniques;Passive range of motion;Energy conservation;Taping    PT Next Visit Plan  conitnue progression of ambulation, stairs, increase leg press resistance or consider single leg; add in dual tasking elements     PT Home Exercise Plan  see sheet    Consulted and Agree with Plan of Care  Patient       Patient will benefit from skilled therapeutic intervention in order to improve the following deficits and impairments:  Abnormal gait, Decreased activity tolerance, Decreased balance, Decreased knowledge of precautions, Decreased endurance, Decreased knowledge of use of DME, Decreased mobility, Decreased safety awareness, Difficulty walking, Decreased strength, Improper body mechanics  Visit Diagnosis: Muscle weakness (generalized)  Other abnormalities of gait  and mobility  Unsteadiness on feet  Abnormality of gait     Problem List Patient Active Problem List   Diagnosis Date Noted  . Hypertensive urgency 12/31/2016  . Stroke (HCC) 10/15/2016  . TIA (transient ischemic attack) 03/07/2016  . Acute CVA (cerebrovascular accident) (HCC) 07/03/2015   Precious Bard, PT, DPT   11/23/2017, 4:55 PM  [facility] MAIN [facility] SERVICES [address] [place], [place], [address] Phone: [phone]   Fax:  [phone]  Name: Christopher [name] MRN: [id number] Date of Birth: March 14, 1926

## 2017-11-25 ENCOUNTER — Ambulatory Visit: Payer: Medicare Other

## 2017-11-25 DIAGNOSIS — R2681 Unsteadiness on feet: Secondary | ICD-10-CM

## 2017-11-25 DIAGNOSIS — R2689 Other abnormalities of gait and mobility: Secondary | ICD-10-CM | POA: Diagnosis not present

## 2017-11-25 DIAGNOSIS — M6281 Muscle weakness (generalized): Secondary | ICD-10-CM

## 2017-11-25 NOTE — Therapy (Signed)
[facility] MAIN [facility] SERVICES [address] [place], [place], [address] Phone: [phone]   Fax:  [phone]  Physical Therapy Treatment  Patient Details  Name: Christopher [name] MRN: [id number] Date of Birth: 01-28-1926 Referring Provider: Dr. Hans Eden   Encounter Date: 11/25/2017  PT End of Session - 11/25/17 1650    Visit Number  7    Number of Visits  16    Date for PT Re-Evaluation  12/22/17    Authorization Type  7/10    PT Start Time  1610    PT Stop Time  1648    PT Time Calculation (min)  38 min    Equipment Utilized During Treatment  Gait belt    Activity Tolerance  Patient tolerated treatment well    Behavior During Therapy  [facility] for tasks assessed/performed       Past Medical History:  Diagnosis Date  . Arthritis   . Ataxia   . Diabetes mellitus without complication (HCC)    controlled, pt checks it in the morning   . Dyslipidemia   . Hypertension    somewhat controlled, pt checks every morning, he reports it has been good.  . Stroke (HCC)   . TIA (transient ischemic attack)     Past Surgical History:  Procedure Laterality Date  . none      There were no vitals filed for this visit.  Subjective Assessment - 11/25/17 1649    Subjective  Patient presents late to session today. Reports no pain. Reports no pain or LOB.     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 2017, and CVA Dec 2016. Was seen at this facility by a different therapist until 03/2017. Patient reports that he was doing his exercises at home when his nurse would come. Now he reports he does not have a nurse coming to his house and does not do his exercises.  Reports that he is not walking as fast now as he was able to before or for as long of distances. Reports no falls.     Limitations  Walking;House hold activities;Other (comment)    How long can you walk  comfortably?  across his house     Diagnostic tests  MR 6/27: IMPRESSION: No acute intracranial abnormality identified. Small chronic infarcts in basal ganglia, left paramedian pons,and right frontal subcortical white matter. Stable moderate chronic microvascular ischemic changes and mild parenchymal volume loss of the brain. Single interval punctate focus of microhemorrhage and left brachium pontis. Additional scattered stable chronic foci of microhemorrhage with central predominance, likely related to chronic hypertension.    Patient Stated Goals  Increase gait speed and walk for longer    Currently in Pain?  No/denies         Ankle weights: 5lb             Standing marches 20x; SUE support x2 trials.               Standing hip extension 20x each leg ; BUE support   Standing hip abduction 2ox each leg; BUE support             Seated LAQ 10x 3 second holds with cues for upright posture     knubby bosu reach Single LE without UE support 8x for 3 different bosus. No LOB  airex pad: reach and transfer balls from different  rungs of saebo tree.   airex pad: balloon pass with son, 3 minutes no LOB reach within and outside BOS  Ambulate 200 ft with horizontal head turns, frequent verbal cueing for lifting and clearing feet with scuffing of bilateral feet R>L.   Standing heel raises toe raises 20x in // bars BUE support.   Step over three consecutive half foam rollers in // bars to promote increased foot clearance with ambulation     Pt. response to medical necessity:  Patient will continue to benefit from skilled physical therapy to improve gait mechanics, strength, and balance for return to PLOF                   PT Education - 11/25/17 1649    Education provided  Yes    Education Details  HEP compliance, exercise technique     Methods  Explanation;Demonstration;Verbal cues    Comprehension  Verbalized understanding;Returned demonstration;Verbal cues required        PT Short Term Goals - 10/27/17 1719      PT SHORT TERM GOAL #1   Title  Patient will be independent in home exercise program to improve strength/mobility for better functional independence with ADLs.    Baseline  HEP given     Time  2    Period  Weeks    Status  New    Target Date  11/10/17      PT SHORT TERM GOAL #2   Title  Patient will tolerate 5 seconds of single leg stance without loss of balance to improve ability to get in and out of shower safely.    Baseline  unable to SLS    Time  2    Period  Weeks    Status  New    Target Date  11/10/17      PT SHORT TERM GOAL #3   Title  Patient will increase six minute walk test distance to 600 ft for progression to community ambulator and improve gait ability    Baseline  441 ft    Time  2    Period  Weeks    Status  New    Target Date  11/10/17      PT SHORT TERM GOAL #4   Title  Patient will increase BLE gross strength to 4+/5 as to improve functional strength for independent gait, increased standing tolerance and increased ADL ability.    Baseline  4-/5    Time  2    Period  Weeks    Status  New    Target Date  11/10/17        PT Long Term Goals - 10/27/17 1721      PT LONG TERM GOAL #1   Title  Patient will demonstrate an improved Berg Balance Score of >50/56 as to demonstrate improved balance with ADLs such as sitting/standing and transfer balance and reduced fall risk    Baseline  42/56    Time  8    Period  Weeks    Status  New    Target Date  12/22/17      PT LONG TERM GOAL #2   Title  Patient will increase 10 meter walk test to >1.9m/s as to improve gait speed for better community ambulation and to reduce fall risk    Baseline  .25m/s with quad cane    Time  8    Period  Weeks    Status  New    Target  Date  12/22/17      PT LONG TERM GOAL #3   Title  Patient will be modified independent in walking on even/uneven surface with least restrictive assistive device, for 20+ minutes without rest break,  reporting some difficulty or less to improve walking tolerance with community ambulation including grocery shopping, going to church,etc.     Baseline  4/23: unable to walk that distance    Time  8    Period  Weeks    Status  New    Target Date  12/22/17      PT LONG TERM GOAL #4   Title  Patient will increase six minute walk test distance to >800 for progression to community ambulator and improve gait ability    Baseline  4/23: 441 ft    Time  8    Period  Weeks    Status  New    Target Date  12/22/17            Plan - 11/25/17 1651    Clinical Impression Statement  Patient demonstrates increased shuffle with poor clearance of RLE with fatigue. Improved static and dynamic balance noted with no LOB despite reaching inside and outside of BOS. RLE muscle weakness combined with poor muscle recruitment reduce ambulatory duration. Patient will continue to benefit from skilled physical therapy to improve gait mechanics, strength, and balance for return to PLOF     Rehab Potential  Fair    Clinical Impairments Affecting Rehab Potential  (+) motivation, family support  (-) age, difficulty hearing/remembering    PT Frequency  2x / week    PT Duration  8 weeks    PT Treatment/Interventions  ADLs/Self Care Home Management;Electrical Stimulation;Therapeutic activities;Functional mobility training;Stair training;Gait training;DME Instruction;Therapeutic exercise;Balance training;Neuromuscular re-education;Patient/family education;Manual techniques;Passive range of motion;Energy conservation;Taping    PT Next Visit Plan  conitnue progression of ambulation, stairs, increase leg press resistance or consider single leg; add in dual tasking elements     PT Home Exercise Plan  see sheet    Consulted and Agree with Plan of Care  Patient       Patient will benefit from skilled therapeutic intervention in order to improve the following deficits and impairments:  Abnormal gait, Decreased activity  tolerance, Decreased balance, Decreased knowledge of precautions, Decreased endurance, Decreased knowledge of use of DME, Decreased mobility, Decreased safety awareness, Difficulty walking, Decreased strength, Improper body mechanics  Visit Diagnosis: Muscle weakness (generalized)  Other abnormalities of gait and mobility  Unsteadiness on feet     Problem List Patient Active Problem List   Diagnosis Date Noted  . Hypertensive urgency 12/31/2016  . Stroke (HCC) 10/15/2016  . TIA (transient ischemic attack) 03/07/2016  . Acute CVA (cerebrovascular accident) (HCC) 07/03/2015   Precious Bard, PT, DPT   11/25/2017, 4:52 PM   [facility] MAIN [facility] SERVICES [address] [place], [place], [address] Phone: [phone]   Fax:  [phone]  Name: Christopher [name] MRN: [id number] Date of Birth: [birth year]

## 2017-12-02 ENCOUNTER — Ambulatory Visit: Payer: Medicare Other

## 2017-12-02 DIAGNOSIS — R2681 Unsteadiness on feet: Secondary | ICD-10-CM

## 2017-12-02 DIAGNOSIS — R2689 Other abnormalities of gait and mobility: Secondary | ICD-10-CM

## 2017-12-02 DIAGNOSIS — M6281 Muscle weakness (generalized): Secondary | ICD-10-CM

## 2017-12-02 NOTE — Therapy (Signed)
[facility] MAIN [facility] SERVICES [address] [place], [place], [address] Phone: [phone]   Fax:  [phone]  Physical Therapy Treatment  Patient Details  Name: Christopher [name] MRN: [id number] Date of Birth: 1926/04/13 Referring Provider: Dr. Hans Eden   Encounter Date: 12/02/2017  PT End of Session - 12/02/17 1607    Visit Number  8    Number of Visits  16    Date for PT Re-Evaluation  12/22/17    Authorization Type  8/10    PT Start Time  1601    PT Stop Time  1645    PT Time Calculation (min)  44 min    Equipment Utilized During Treatment  Gait belt    Activity Tolerance  Patient tolerated treatment well    Behavior During Therapy  [facility] for tasks assessed/performed       Past Medical History:  Diagnosis Date  . Arthritis   . Ataxia   . Diabetes mellitus without complication (HCC)    controlled, pt checks it in the morning   . Dyslipidemia   . Hypertension    somewhat controlled, pt checks every morning, he reports it has been good.  . Stroke (HCC)   . TIA (transient ischemic attack)     Past Surgical History:  Procedure Laterality Date  . none      There were no vitals filed for this visit.  Subjective Assessment - 12/02/17 1606    Subjective  Patient presents to therapy session with no pain. States he has been doing his exercises at home for an hour. No falls or complaints. Walking still is challenging to patient.     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 2017, and CVA Dec 2016. Was seen at this facility by a different therapist until 03/2017. Patient reports that he was doing his exercises at home when his nurse would come. Now he reports he does not have a nurse coming to his house and does not do his exercises.  Reports that he is not walking as fast now as he was able to before or for as long of distances. Reports no falls.      Limitations  Walking;House hold activities;Other (comment)    How long can you walk comfortably?  across his house     Diagnostic tests  MR 6/27: IMPRESSION: No acute intracranial abnormality identified. Small chronic infarcts in basal ganglia, left paramedian pons,and right frontal subcortical white matter. Stable moderate chronic microvascular ischemic changes and mild parenchymal volume loss of the brain. Single interval punctate focus of microhemorrhage and left brachium pontis. Additional scattered stable chronic foci of microhemorrhage with central predominance, likely related to chronic hypertension.    Patient Stated Goals  Increase gait speed and walk for longer    Currently in Pain?  No/denies         Ankle weights: 5lb             Standing marches 20x; SUE support             Standing hip extension 20x each leg ; BUE support              Standing hip abduction 2ox each leg; BUE support                 Step over and back orange hurdle 15x each foot SUE support (LUE)  6" step toe taps no UE support  Airex pad ball throw 15x into basketball hoop     airex pad: balloon pass with intern, 3 minutes no LOB reach within and outside BOS   Ambulate 200 ft with horizontal head turns, frequent verbal cueing for lifting and clearing feet with scuffing of bilateral feet R>L.    Standing heel raises toe raises 20x in // bars BUE support.       Pt. response to medical necessity:  Patient will continue to benefit from skilled physical therapy to improve gait mechanics, strength, and balance for return to PLOF                         PT Education - 12/02/17 1607    Education provided  Yes    Education Details  gait mechanics, exercise technique     Person(s) Educated  Patient    Methods  Explanation;Demonstration;Verbal cues    Comprehension  Verbalized understanding;Returned demonstration       PT Short Term Goals - 10/27/17 1719      PT SHORT TERM GOAL #1    Title  Patient will be independent in home exercise program to improve strength/mobility for better functional independence with ADLs.    Baseline  HEP given     Time  2    Period  Weeks    Status  New    Target Date  11/10/17      PT SHORT TERM GOAL #2   Title  Patient will tolerate 5 seconds of single leg stance without loss of balance to improve ability to get in and out of shower safely.    Baseline  unable to SLS    Time  2    Period  Weeks    Status  New    Target Date  11/10/17      PT SHORT TERM GOAL #3   Title  Patient will increase six minute walk test distance to 600 ft for progression to community ambulator and improve gait ability    Baseline  441 ft    Time  2    Period  Weeks    Status  New    Target Date  11/10/17      PT SHORT TERM GOAL #4   Title  Patient will increase BLE gross strength to 4+/5 as to improve functional strength for independent gait, increased standing tolerance and increased ADL ability.    Baseline  4-/5    Time  2    Period  Weeks    Status  New    Target Date  11/10/17        PT Long Term Goals - 10/27/17 1721      PT LONG TERM GOAL #1   Title  Patient will demonstrate an improved Berg Balance Score of >50/56 as to demonstrate improved balance with ADLs such as sitting/standing and transfer balance and reduced fall risk    Baseline  42/56    Time  8    Period  Weeks    Status  New    Target Date  12/22/17      PT LONG TERM GOAL #2   Title  Patient will increase 10 meter walk test to >1.81m/s as to improve gait speed for better community ambulation and to reduce fall risk    Baseline  .72m/s with quad cane    Time  8    Period  Weeks  Status  New    Target Date  12/22/17      PT LONG TERM GOAL #3   Title  Patient will be modified independent in walking on even/uneven surface with least restrictive assistive device, for 20+ minutes without rest break, reporting some difficulty or less to improve walking tolerance with  community ambulation including grocery shopping, going to church,etc.     Baseline  4/23: unable to walk that distance    Time  8    Period  Weeks    Status  New    Target Date  12/22/17      PT LONG TERM GOAL #4   Title  Patient will increase six minute walk test distance to >800 for progression to community ambulator and improve gait ability    Baseline  4/23: 441 ft    Time  8    Period  Weeks    Status  New    Target Date  12/22/17            Plan - 12/02/17 1804    Clinical Impression Statement  Patient shuffles RLE when ambulating with fatigue or dueling tasks due to poor attention. Standing interventions with weights require single UE support for stability at this time. Patient will continue to benefit from skilled physical therapy to improve gait mechanics, strength, and balance for return to PLOF    Rehab Potential  Fair    Clinical Impairments Affecting Rehab Potential  (+) motivation, family support  (-) age, difficulty hearing/remembering    PT Frequency  2x / week    PT Duration  8 weeks    PT Treatment/Interventions  ADLs/Self Care Home Management;Electrical Stimulation;Therapeutic activities;Functional mobility training;Stair training;Gait training;DME Instruction;Therapeutic exercise;Balance training;Neuromuscular re-education;Patient/family education;Manual techniques;Passive range of motion;Energy conservation;Taping    PT Next Visit Plan  conitnue progression of ambulation, stairs, increase leg press resistance or consider single leg; add in dual tasking elements     PT Home Exercise Plan  see sheet    Consulted and Agree with Plan of Care  Patient       Patient will benefit from skilled therapeutic intervention in order to improve the following deficits and impairments:  Abnormal gait, Decreased activity tolerance, Decreased balance, Decreased knowledge of precautions, Decreased endurance, Decreased knowledge of use of DME, Decreased mobility, Decreased safety  awareness, Difficulty walking, Decreased strength, Improper body mechanics  Visit Diagnosis: Muscle weakness (generalized)  Other abnormalities of gait and mobility  Unsteadiness on feet     Problem List Patient Active Problem List   Diagnosis Date Noted  . Hypertensive urgency 12/31/2016  . Stroke (HCC) 10/15/2016  . TIA (transient ischemic attack) 03/07/2016  . Acute CVA (cerebrovascular accident) (HCC) 07/03/2015   Precious Bard, PT, DPT   12/02/2017, 6:04 PM  [facility] MAIN [facility] SERVICES [address] [place], [place], [address] Phone: [phone]   Fax:  [phone]  Name: Christopher [name] MRN: [id number] Date of Birth: Apr 18, 1926

## 2017-12-07 ENCOUNTER — Ambulatory Visit: Payer: Medicare Other | Attending: Family Medicine

## 2017-12-07 DIAGNOSIS — R2681 Unsteadiness on feet: Secondary | ICD-10-CM | POA: Insufficient documentation

## 2017-12-07 DIAGNOSIS — M6281 Muscle weakness (generalized): Secondary | ICD-10-CM | POA: Insufficient documentation

## 2017-12-07 DIAGNOSIS — R471 Dysarthria and anarthria: Secondary | ICD-10-CM | POA: Insufficient documentation

## 2017-12-07 DIAGNOSIS — R2689 Other abnormalities of gait and mobility: Secondary | ICD-10-CM | POA: Diagnosis present

## 2017-12-07 DIAGNOSIS — R269 Unspecified abnormalities of gait and mobility: Secondary | ICD-10-CM | POA: Diagnosis present

## 2017-12-07 DIAGNOSIS — Z9181 History of falling: Secondary | ICD-10-CM | POA: Insufficient documentation

## 2017-12-07 NOTE — Therapy (Signed)
[place] [facility] MAIN [facility] SERVICES [address] [place], [place], [address] Phone: [phone]   Fax:  [phone]  Physical Therapy Treatment  Patient Details  Name: Christopher [name] MRN: [id number] Date of Birth: Dec 13, 1925 Referring Provider: Dr. Hans Eden   Encounter Date: 12/07/2017  PT End of Session - 12/07/17 1606    Visit Number  9    Number of Visits  16    Date for PT Re-Evaluation  12/22/17    Authorization Type  9/10    PT Start Time  1600    PT Stop Time  1646    PT Time Calculation (min)  46 min    Equipment Utilized During Treatment  Gait belt    Activity Tolerance  Patient tolerated treatment well    Behavior During Therapy  [facility] for tasks assessed/performed       Past Medical History:  Diagnosis Date  . Arthritis   . Ataxia   . Diabetes mellitus without complication (HCC)    controlled, pt checks it in the morning   . Dyslipidemia   . Hypertension    somewhat controlled, pt checks every morning, he reports it has been good.  . Stroke (HCC)   . TIA (transient ischemic attack)     Past Surgical History:  Procedure Laterality Date  . none      There were no vitals filed for this visit.  Subjective Assessment - 12/07/17 1604    Subjective  Patient reports doing his exercises at home. Reports no stumbles or falls since last session. Reports right leg is still not working properly.     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 2017, and CVA Dec 2016. Was seen at this facility by a different therapist until 03/2017. Patient reports that he was doing his exercises at home when his nurse would come. Now he reports he does not have a nurse coming to his house and does not do his exercises.  Reports that he is not walking as fast now as he was able to before or for as long of distances. Reports no falls.     Limitations  Walking;House hold  activities;Other (comment)    How long can you walk comfortably?  across his house     Diagnostic tests  MR 6/27: IMPRESSION: No acute intracranial abnormality identified. Small chronic infarcts in basal ganglia, left paramedian pons,and right frontal subcortical white matter. Stable moderate chronic microvascular ischemic changes and mild parenchymal volume loss of the brain. Single interval punctate focus of microhemorrhage and left brachium pontis. Additional scattered stable chronic foci of microhemorrhage with central predominance, likely related to chronic hypertension.    Patient Stated Goals  Increase gait speed and walk for longer    Currently in Pain?  No/denies           Single leg leg press #45 1x15 BLE; #60 1x15 BLE (one at a time)   Airex pad ball throw 15x into basketball hoop     Ambulate 500 ft without AD in hallway; verbal cues for lifting LE's for clearance. RLE fatigues quickly resulting in poor hip and knee flexion and foot drag. Cues for heel strike with Mod verbal cueing.    STS 15x raising 5lb bar;difficulty controlling eccentric sit portion.   Standing heel raises toe raises 20x in // bars BUE support.    5lb ankles weights seated;  LAQ 15x each leg, 2 sets   Marching 15x each leg2 sets     Pt. response to medical necessity:  Patient will continue to benefit from skilled physical therapy to improve gait mechanics, strength, and balance for return to PLOF                        PT Education - 12/07/17 1655    Education provided  Yes    Education Details  gait mechanics, exercise technique     Person(s) Educated  Patient    Methods  Explanation;Demonstration;Verbal cues    Comprehension  Verbalized understanding;Returned demonstration       PT Short Term Goals - 10/27/17 1719      PT SHORT TERM GOAL #1   Title  Patient will be independent in home exercise program to improve strength/mobility for better functional independence with  ADLs.    Baseline  HEP given     Time  2    Period  Weeks    Status  New    Target Date  11/10/17      PT SHORT TERM GOAL #2   Title  Patient will tolerate 5 seconds of single leg stance without loss of balance to improve ability to get in and out of shower safely.    Baseline  unable to SLS    Time  2    Period  Weeks    Status  New    Target Date  11/10/17      PT SHORT TERM GOAL #3   Title  Patient will increase six minute walk test distance to 600 ft for progression to community ambulator and improve gait ability    Baseline  441 ft    Time  2    Period  Weeks    Status  New    Target Date  11/10/17      PT SHORT TERM GOAL #4   Title  Patient will increase BLE gross strength to 4+/5 as to improve functional strength for independent gait, increased standing tolerance and increased ADL ability.    Baseline  4-/5    Time  2    Period  Weeks    Status  New    Target Date  11/10/17        PT Long Term Goals - 10/27/17 1721      PT LONG TERM GOAL #1   Title  Patient will demonstrate an improved Berg Balance Score of >50/56 as to demonstrate improved balance with ADLs such as sitting/standing and transfer balance and reduced fall risk    Baseline  42/56    Time  8    Period  Weeks    Status  New    Target Date  12/22/17      PT LONG TERM GOAL #2   Title  Patient will increase 10 meter walk test to >1.1132m/s as to improve gait speed for better community ambulation and to reduce fall risk    Baseline  .6673m/s with quad cane    Time  8    Period  Weeks    Status  New    Target Date  12/22/17      PT LONG TERM GOAL #3   Title  Patient will be modified independent in walking on even/uneven surface with least restrictive assistive device, for 20+ minutes without rest break, reporting some difficulty or less to improve walking tolerance with community ambulation including grocery  shopping, going to church,etc.     Baseline  4/23: unable to walk that distance    Time  8     Period  Weeks    Status  New    Target Date  12/22/17      PT LONG TERM GOAL #4   Title  Patient will increase six minute walk test distance to >800 for progression to community ambulator and improve gait ability    Baseline  4/23: 441 ft    Time  8    Period  Weeks    Status  New    Target Date  12/22/17            Plan - 12/07/17 1655    Clinical Impression Statement  Patient demonstrates R foot drag with fatigue when ambulating longer durations.Due to patient's son not being present use of demonstration/pantomime required for patient understanding of task orientation.   Patient will continue to benefit from skilled physical therapy to improve gait mechanics, strength, and balance for return to PLOF    Rehab Potential  Fair    Clinical Impairments Affecting Rehab Potential  (+) motivation, family support  (-) age, difficulty hearing/remembering    PT Frequency  2x / week    PT Duration  8 weeks    PT Treatment/Interventions  ADLs/Self Care Home Management;Electrical Stimulation;Therapeutic activities;Functional mobility training;Stair training;Gait training;DME Instruction;Therapeutic exercise;Balance training;Neuromuscular re-education;Patient/family education;Manual techniques;Passive range of motion;Energy conservation;Taping    PT Next Visit Plan  conitnue progression of ambulation, stairs, increase leg press resistance or consider single leg; add in dual tasking elements     PT Home Exercise Plan  see sheet    Consulted and Agree with Plan of Care  Patient       Patient will benefit from skilled therapeutic intervention in order to improve the following deficits and impairments:  Abnormal gait, Decreased activity tolerance, Decreased balance, Decreased knowledge of precautions, Decreased endurance, Decreased knowledge of use of DME, Decreased mobility, Decreased safety awareness, Difficulty walking, Decreased strength, Improper body mechanics  Visit Diagnosis: Muscle  weakness (generalized)  Other abnormalities of gait and mobility  Unsteadiness on feet     Problem List Patient Active Problem List   Diagnosis Date Noted  . Hypertensive urgency 12/31/2016  . Stroke (HCC) 10/15/2016  . TIA (transient ischemic attack) 03/07/2016  . Acute CVA (cerebrovascular accident) (HCC) 07/03/2015   Precious Bard, PT, DPT   12/07/2017, 4:58 PM  [place] [facility] MAIN [facility] SERVICES [address] [place], [place], [address] Phone: [phone]   Fax:  [phone]  Name: Khizar Fiorella MRN: [id number] Date of Birth: 24-Sep-1925

## 2017-12-09 ENCOUNTER — Ambulatory Visit: Payer: Medicare Other

## 2017-12-09 DIAGNOSIS — R2689 Other abnormalities of gait and mobility: Secondary | ICD-10-CM

## 2017-12-09 DIAGNOSIS — R2681 Unsteadiness on feet: Secondary | ICD-10-CM

## 2017-12-09 DIAGNOSIS — M6281 Muscle weakness (generalized): Secondary | ICD-10-CM

## 2017-12-09 NOTE — Therapy (Signed)
[place] MAIN [facility] SERVICES [place], [place], [address] Phone: [phone]   Fax:  [phone]  Physical Therapy Treatment  Patient Details  Name: Christopher [name] MRN: [id number] Date of Birth: Apr 28, 1926 Referring Provider: Dr. Chryl Heck   Encounter Date: 12/09/2017  PT End of Session - 12/09/17 1640    Visit Number  10    Number of Visits  32    Date for PT Re-Evaluation  02/03/18    Authorization Type  10/10 next 1/10     PT Start Time  1600    PT Stop Time  1644    PT Time Calculation (min)  44 min    Equipment Utilized During Treatment  Gait belt    Activity Tolerance  Patient tolerated treatment well    Behavior During Therapy  [facility] for tasks assessed/performed       Past Medical History:  Diagnosis Date  . Arthritis   . Ataxia   . Diabetes mellitus without complication ([place])    controlled, pt checks it in the morning   . Dyslipidemia   . Hypertension    somewhat controlled, pt checks every morning, he reports it has been good.  . Stroke ([place])   . TIA (transient ischemic attack)     Past Surgical History:  Procedure Laterality Date  . none      There were no vitals filed for this visit.  Subjective Assessment - 12/09/17 1602    Subjective  Patient reports doing exercises from previous PT sessions : walk with bands around legs, squeezing balls, seated moving legs back and forth, laying down exercises. No stumbles or falls since last session.     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 2017, and CVA Dec 2016. Was seen at this facility by a different therapist until 03/2017. Patient reports that he was doing his exercises at home when his nurse would come. Now he reports he does not have a nurse coming to his house and does not do his exercises.  Reports that he is not walking as fast now as he was able to before or for as  long of distances. Reports no falls.     Limitations  Walking;House hold activities;Other (comment)    How long can you walk comfortably?  across his house     Diagnostic tests  MR 6/27: IMPRESSION: No acute intracranial abnormality identified. Small chronic infarcts in basal ganglia, left paramedian pons,and right frontal subcortical white matter. Stable moderate chronic microvascular ischemic changes and mild parenchymal volume loss of the brain. Single interval punctate focus of microhemorrhage and left brachium pontis. Additional scattered stable chronic foci of microhemorrhage with central predominance, likely related to chronic hypertension.    Patient Stated Goals  Increase gait speed and walk for longer    Currently in Pain?  No/denies       Physical Therapy Progress Note   Dates of reporting period  10/27/17   to   12/09/17  Patient's condition has the potential to improve in response to therapy. Maximum improvement is yet to be obtained. The anticipated improvement is attainable and reasonable in a generally predictable time. Start date of reporting period 10/27/17 end date of reporting period 12/09/17 Patient reports  That he is improving in strength but feels his walking is still impaired.    5 seconds SLS 6 min walk test :  793 ft with 3 LOB requiring Min A to retin standing. Patient scuffing feet whole time.  BLE strength : 4/5 BERG: 47/56 10 MWT: 12.1 seconds=.83 m/s  Seated 5lb weights   Marches 20x each leg   LAQ 15x each leg  [facility] PT Assessment - 12/09/17 0001      Berg Balance Test   Sit to Stand  Able to stand without using hands and stabilize independently    Standing Unsupported  Able to stand safely 2 minutes    Sitting with Back Unsupported but Feet Supported on Floor or Stool  Able to sit safely and securely 2 minutes    Stand to Sit  Sits safely with minimal use of hands    Transfers  Able to transfer safely, minor use of hands    Standing Unsupported with Eyes  Closed  Able to stand 10 seconds safely    Standing Ubsupported with Feet Together  Able to place feet together independently and stand 1 minute safely    From Standing, Reach Forward with Outstretched Arm  Can reach forward >12 cm safely (5")    From Standing Position, Pick up Object from Floor  Able to pick up shoe, needs supervision    From Standing Position, Turn to Look Behind Over each Shoulder  Looks behind from both sides and weight shifts well    Turn 360 Degrees  Able to turn 360 degrees safely but slowly    Standing Unsupported, Alternately Place Feet on Step/Stool  Able to stand independently and complete 8 steps >20 seconds    Standing Unsupported, One Foot in Front  Able to plae foot ahead of the other independently and hold 30 seconds    Standing on One Leg  Tries to lift leg/unable to hold 3 seconds but remains standing independently    Total Score  47        Ambulate over two consecutive hurdles in // bars to promote hip flexion with ambulation. 8x no UE support, CGA  Half foam roller df pf 15x in // bars ; SUE support                     PT Education - 12/09/17 1637    Education provided  Yes    Education Details  goal progression, ambulatory mechanics    Person(s) Educated  Patient    Methods  Explanation;Demonstration;Verbal cues    Comprehension  Verbalized understanding;Returned demonstration       PT Short Term Goals - 12/09/17 1623      PT SHORT TERM GOAL #1   Title  Patient will be independent in home exercise program to improve strength/mobility for better functional independence with ADLs.    Baseline  HEP given     Time  2    Period  Weeks    Status  Partially Met    Target Date  12/23/17      PT SHORT TERM GOAL #2   Title  Patient will tolerate 5 seconds of single leg stance without loss of balance to improve ability to get in and out of shower safely.    Baseline  3 seconds    Time  2    Period  Weeks    Status  Partially Met     Target Date  12/23/17      PT SHORT TERM GOAL #3   Title  Patient will increase six minute walk test distance to 600 ft for progression to community  ambulator and improve gait ability    Baseline  441 ft; 6/5: 793    Time  2    Period  Weeks    Status  Achieved      PT SHORT TERM GOAL #4   Title  Patient will increase BLE gross strength to 4+/5 as to improve functional strength for independent gait, increased standing tolerance and increased ADL ability.    Baseline  4-/5; 6/5 4/5    Time  2    Period  Weeks    Status  Achieved        PT Long Term Goals - 12/09/17 1627      PT LONG TERM GOAL #1   Title  Patient will demonstrate an improved Berg Balance Score of >50/56 as to demonstrate improved balance with ADLs such as sitting/standing and transfer balance and reduced fall risk    Baseline  42/56; 6/5: 47/56    Time  8    Period  Weeks    Status  Partially Met    Target Date  02/03/18      PT LONG TERM GOAL #2   Title  Patient will increase 10 meter walk test to >1.39ms as to improve gait speed for better community ambulation and to reduce fall risk    Baseline  .286m with quad cane; 65: .83 m/s with no AD    Time  8    Period  Weeks    Status  Partially Met    Target Date  02/03/18      PT LONG TERM GOAL #3   Title  Patient will be modified independent in walking on even/uneven surface with least restrictive assistive device, for 20+ minutes without rest break, reporting some difficulty or less to improve walking tolerance with community ambulation including grocery shopping, going to church,etc.     Baseline  4/23: unable to walk that distance; 6/5: challenged with 6 minutes on stable surface    Time  8    Period  Weeks    Status  Partially Met    Target Date  02/03/18      PT LONG TERM GOAL #4   Title  Patient will increase six minute walk test distance to >800 for progression to community ambulator and improve gait ability    Baseline  4/23: 441 ft 6/5: 793  with 3 episodes of LOB and frequent shuffling of LEs.     Time  8    Period  Weeks    Status  Partially Met    Target Date  02/03/18            Plan - 12/09/17 1631    Clinical Impression Statement  Patient progressing towards goals at this time. Although patient has doubled distance ambulated in 6 minute walk test patietn had frequent scuffling of RLE with three episodes of LOB requiring Min A to retain COM. 10 MWT improved to .83 m/s without AD with CGA. BERG 47/56. Patient challenged with carryover from tasks requiring hip and knee flexion to ambulation resulting in scuffing of R foot. Patient's condition has the potential to improve in response to therapy. Maximum improvement is yet to be obtained. The anticipated improvement is attainable and reasonable in a generally predictable time. Start date of reporting period 10/27/17 end date of reporting period 12/09/17 Patient reports  That he is improving in strength but feels his walking is still impaired. Patient will continue to benefit from skilled physical therapy to improve gait  mechanics, strength, and balance for return to PLOF    Rehab Potential  Fair    Clinical Impairments Affecting Rehab Potential  (+) motivation, family support  (-) age, difficulty hearing/remembering    PT Frequency  2x / week    PT Duration  8 weeks    PT Treatment/Interventions  ADLs/Self Care Home Management;Electrical Stimulation;Therapeutic activities;Functional mobility training;Stair training;Gait training;DME Instruction;Therapeutic exercise;Balance training;Neuromuscular re-education;Patient/family education;Manual techniques;Passive range of motion;Energy conservation;Taping    PT Next Visit Plan  conitnue progression of ambulation, stairs, increase leg press resistance or consider single leg; add in dual tasking elements     PT Home Exercise Plan  see sheet    Consulted and Agree with Plan of Care  Patient       Patient will benefit from skilled  therapeutic intervention in order to improve the following deficits and impairments:  Abnormal gait, Decreased activity tolerance, Decreased balance, Decreased knowledge of precautions, Decreased endurance, Decreased knowledge of use of DME, Decreased mobility, Decreased safety awareness, Difficulty walking, Decreased strength, Improper body mechanics  Visit Diagnosis: Muscle weakness (generalized)  Other abnormalities of gait and mobility  Unsteadiness on feet     Problem List Patient Active Problem List   Diagnosis Date Noted  . Hypertensive urgency 12/31/2016  . Stroke ([place]) 10/15/2016  . TIA (transient ischemic attack) 03/07/2016  . Acute CVA (cerebrovascular accident) ([place]) 07/03/2015   Janna Arch, PT, DPT   12/09/2017, 5:05 PM  [place] MAIN [facility] SERVICES [address] [place], [place], [address] Phone: [phone]   Fax:  [phone]  Name: Christopher [name] MRN: [id number] Date of Birth: 1926-04-13

## 2017-12-14 ENCOUNTER — Ambulatory Visit: Payer: Medicare Other | Admitting: Physical Therapy

## 2017-12-14 ENCOUNTER — Encounter: Payer: Self-pay | Admitting: Physical Therapy

## 2017-12-14 DIAGNOSIS — M6281 Muscle weakness (generalized): Secondary | ICD-10-CM | POA: Diagnosis not present

## 2017-12-14 DIAGNOSIS — R2681 Unsteadiness on feet: Secondary | ICD-10-CM

## 2017-12-14 DIAGNOSIS — Z9181 History of falling: Secondary | ICD-10-CM

## 2017-12-14 DIAGNOSIS — R471 Dysarthria and anarthria: Secondary | ICD-10-CM

## 2017-12-14 DIAGNOSIS — R2689 Other abnormalities of gait and mobility: Secondary | ICD-10-CM

## 2017-12-14 DIAGNOSIS — R269 Unspecified abnormalities of gait and mobility: Secondary | ICD-10-CM

## 2017-12-14 NOTE — Therapy (Signed)
[place] MAIN [facility] SERVICES [address] [place], [place], [address] Phone: [phone]   Fax:  [phone]  Physical Therapy Treatment  Patient Details  Name: Christopher [name] MRN: [id number] Date of Birth: 1926/05/09 Referring Provider: Dr. Chryl Heck   Encounter Date: 12/14/2017  PT End of Session - 12/14/17 1618    Visit Number  10    Number of Visits  32    Date for PT Re-Evaluation  02/03/18    Authorization Type  1/10     PT Start Time  0405    PT Stop Time  0445    PT Time Calculation (min)  40 min    Equipment Utilized During Treatment  Gait belt    Activity Tolerance  Patient tolerated treatment well    Behavior During Therapy  [facility] for tasks assessed/performed       Past Medical History:  Diagnosis Date  . Arthritis   . Ataxia   . Diabetes mellitus without complication ([place])    controlled, pt checks it in the morning   . Dyslipidemia   . Hypertension    somewhat controlled, pt checks every morning, he reports it has been good.  . Stroke ([place])   . TIA (transient ischemic attack)     Past Surgical History:  Procedure Laterality Date  . none      There were no vitals filed for this visit.  Subjective Assessment - 12/14/17 1617    Subjective  Patient reports doing exercises from previous PT sessions : walk with bands around legs, squeezing balls, seated moving legs back and forth, laying down exercises. No stumbles or falls since last session.     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 2017, and CVA Dec 2016. Was seen at this facility by a different therapist until 03/2017. Patient reports that he was doing his exercises at home when his nurse would come. Now he reports he does not have a nurse coming to his house and does not do his exercises.  Reports that he is not walking as fast  now as he was able to before or for as long of distances. Reports no falls.     Limitations  Walking;House hold activities;Other (comment)    How long can you sit comfortably?  n/a    How long can you stand comfortably?  n/a    How long can you walk comfortably?  across his house     Diagnostic tests  MR 6/27: IMPRESSION: No acute intracranial abnormality identified. Small chronic infarcts in basal ganglia, left paramedian pons,and right frontal subcortical white matter. Stable moderate chronic microvascular ischemic changes and mild parenchymal volume loss of the brain. Single interval punctate focus of microhemorrhage and left brachium pontis. Additional scattered stable chronic foci of microhemorrhage with central predominance, likely related to chronic hypertension.    Patient Stated Goals  Increase gait speed and walk for longer    Currently in Pain?  No/denies    Pain Score  0-No pain      Treatment:  Single leg leg press #45 1x15 BLE; #60 1x15 BLE (one at a time)   Supine knee to chest with 3 lbs x 10 BLE   SAQ with 3 lbs and 3 sec hold x 20 BLE  sidelying  hip abd with 3 lbs x  15 BLE  Marching with 3 lbs x 15 x 2 sets   LAQ with 3 lbs x 15   Hooklying marching with 3 lbs x 20 BLE  Hooklying hip abd/ER with BTB x 20  Bridges x 10 x 2   Standing heel raises toe raises 20x in // bars BUE support.   Leg press 75 lbs single leg x 15 x 2.   Patient needs cues for correct posture and technique with no reports of pain.                           PT Education - 12/14/17 1618    Education provided  Yes    Education Details  saftey with mobility    Person(s) Educated  Patient    Methods  Explanation;Demonstration;Tactile cues;Verbal cues    Comprehension  Verbalized understanding;Returned demonstration;Verbal cues required       PT Short Term Goals - 12/09/17 1623      PT SHORT TERM GOAL #1   Title  Patient will be independent in home  exercise program to improve strength/mobility for better functional independence with ADLs.    Baseline  HEP given     Time  2    Period  Weeks    Status  Partially Met    Target Date  12/23/17      PT SHORT TERM GOAL #2   Title  Patient will tolerate 5 seconds of single leg stance without loss of balance to improve ability to get in and out of shower safely.    Baseline  3 seconds    Time  2    Period  Weeks    Status  Partially Met    Target Date  12/23/17      PT SHORT TERM GOAL #3   Title  Patient will increase six minute walk test distance to 600 ft for progression to community ambulator and improve gait ability    Baseline  441 ft; 6/5: 793    Time  2    Period  Weeks    Status  Achieved      PT SHORT TERM GOAL #4   Title  Patient will increase BLE gross strength to 4+/5 as to improve functional strength for independent gait, increased standing tolerance and increased ADL ability.    Baseline  4-/5; 6/5 4/5    Time  2    Period  Weeks    Status  Achieved        PT Long Term Goals - 12/09/17 1627      PT LONG TERM GOAL #1   Title  Patient will demonstrate an improved Berg Balance Score of >50/56 as to demonstrate improved balance with ADLs such as sitting/standing and transfer balance and reduced fall risk    Baseline  42/56; 6/5: 47/56    Time  8    Period  Weeks    Status  Partially Met    Target Date  02/03/18      PT LONG TERM GOAL #2   Title  Patient will increase 10 meter walk test to >1.31ms as to improve gait speed for better community ambulation and to reduce fall risk    Baseline  .276m with quad cane; 65: .83 m/s with no AD    Time  8    Period  Weeks    Status  Partially Met    Target Date  02/03/18  PT LONG TERM GOAL #3   Title  Patient will be modified independent in walking on even/uneven surface with least restrictive assistive device, for 20+ minutes without rest break, reporting some difficulty or less to improve walking tolerance with  community ambulation including grocery shopping, going to church,etc.     Baseline  4/23: unable to walk that distance; 6/5: challenged with 6 minutes on stable surface    Time  8    Period  Weeks    Status  Partially Met    Target Date  02/03/18      PT LONG TERM GOAL #4   Title  Patient will increase six minute walk test distance to >800 for progression to community ambulator and improve gait ability    Baseline  4/23: 441 ft 6/5: 793 with 3 episodes of LOB and frequent shuffling of LEs.     Time  8    Period  Weeks    Status  Partially Met    Target Date  02/03/18            Plan - 12/14/17 1629    Clinical Impression Statement  Instructed and progressed patient in advanced LE strengthening. Patient requires min VCs for correct exercise technique to improve strengthening; Patient reports no  pain but does report slight fatigue in BLE quad muscles with advanced exercise; Patient would benefit from additional skilled PT intervention to improve strength, balance/gait safety;    Rehab Potential  Fair    Clinical Impairments Affecting Rehab Potential  (+) motivation, family support  (-) age, difficulty hearing/remembering    PT Frequency  2x / week    PT Duration  8 weeks    PT Treatment/Interventions  ADLs/Self Care Home Management;Electrical Stimulation;Therapeutic activities;Functional mobility training;Stair training;Gait training;DME Instruction;Therapeutic exercise;Balance training;Neuromuscular re-education;Patient/family education;Manual techniques;Passive range of motion;Energy conservation;Taping    PT Next Visit Plan  conitnue progression of ambulation, stairs, increase leg press resistance or consider single leg; add in dual tasking elements     PT Home Exercise Plan  see sheet    Consulted and Agree with Plan of Care  Patient       Patient will benefit from skilled therapeutic intervention in order to improve the following deficits and impairments:  Abnormal gait,  Decreased activity tolerance, Decreased balance, Decreased knowledge of precautions, Decreased endurance, Decreased knowledge of use of DME, Decreased mobility, Decreased safety awareness, Difficulty walking, Decreased strength, Improper body mechanics  Visit Diagnosis: Muscle weakness (generalized)  Other abnormalities of gait and mobility  Unsteadiness on feet  Abnormality of gait  Dysarthria and anarthria  History of fall     Problem List Patient Active Problem List   Diagnosis Date Noted  . Hypertensive urgency 12/31/2016  . Stroke ([place]) 10/15/2016  . TIA (transient ischemic attack) 03/07/2016  . Acute CVA (cerebrovascular accident) ([place]) 07/03/2015    Alanson Puls , PT DPT 12/14/2017, 4:35 PM  [place] MAIN [facility] SERVICES [address] [place], [place], [address] Phone: [phone]   Fax:  [phone]  Name: Christopher [name] MRN: [id number] Date of Birth: Jan 12, 1926

## 2017-12-16 ENCOUNTER — Ambulatory Visit: Payer: Medicare Other

## 2017-12-16 DIAGNOSIS — R2681 Unsteadiness on feet: Secondary | ICD-10-CM

## 2017-12-16 DIAGNOSIS — M6281 Muscle weakness (generalized): Secondary | ICD-10-CM | POA: Diagnosis not present

## 2017-12-16 DIAGNOSIS — R2689 Other abnormalities of gait and mobility: Secondary | ICD-10-CM

## 2017-12-16 NOTE — Therapy (Signed)
[place] MAIN [facility] SERVICES [address] [place], [place], [address] Phone: [phone]   Fax:  [phone]  Physical Therapy Treatment  Patient Details  Name: Christopher [name] MRN: [id number] Date of Birth: [birth year] Referring Provider: Dr. Chryl Heck   Encounter Date: 12/16/2017  PT End of Session - 12/16/17 1549    Visit Number  12    Number of Visits  32    Date for PT Re-Evaluation  02/03/18    Authorization Type  2/10     PT Start Time  1545    PT Stop Time  1634    PT Time Calculation (min)  49 min    Equipment Utilized During Treatment  Gait belt    Activity Tolerance  Patient tolerated treatment well    Behavior During Therapy  [facility] for tasks assessed/performed       Past Medical History:  Diagnosis Date  . Arthritis   . Ataxia   . Diabetes mellitus without complication ([place])    controlled, pt checks it in the morning   . Dyslipidemia   . Hypertension    somewhat controlled, pt checks every morning, he reports it has been good.  . Stroke ([place])   . TIA (transient ischemic attack)     Past Surgical History:  Procedure Laterality Date  . none      There were no vitals filed for this visit.   Nustep Lvl 5 4 minutes no charge  Subjective Assessment - 12/16/17 1548    Subjective  Patient arrived early without son today for session. Reports no stumbles or falls since last session, no pain or concerns.     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 2017, and CVA Dec 2016. Was seen at this facility by a different therapist until 03/2017. Patient reports that he was doing his exercises at home when his nurse would come. Now he reports he does not have a nurse coming to his house and does not do his exercises.  Reports that he is not walking as fast now as he was able to before or for as long of  distances. Reports no falls.     Limitations  Walking;House hold activities;Other (comment)    How long can you sit comfortably?  n/a    How long can you stand comfortably?  n/a    How long can you walk comfortably?  across his house     Diagnostic tests  MR 6/27: IMPRESSION: No acute intracranial abnormality identified. Small chronic infarcts in basal ganglia, left paramedian pons,and right frontal subcortical white matter. Stable moderate chronic microvascular ischemic changes and mild parenchymal volume loss of the brain. Single interval punctate focus of microhemorrhage and left brachium pontis. Additional scattered stable chronic foci of microhemorrhage with central predominance, likely related to chronic hypertension.    Patient Stated Goals  Increase gait speed and walk for longer    Currently in Pain?  No/denies      Single leg leg press  BLE (one at a time); #60 2x15 BLE   Airex pad ball throw 15x into basketball hoop    Ambulate 500 ft without AD in hallway; verbal cues for lifting LE's for clearance. RLE fatigues quickly resulting in poor hip and knee flexion and foot drag. Cues for heel strike with Mod verbal cueing.  STS 15x raising 5lb bar;difficulty controlling eccentric sit portion.   Airex pad step up onto 6" step to throw ball into basketball hoop 15x  Airex pad: balloon pass 2 minutes  Single limb march no UE support 2x10 each LE.     Standing heel raises toe raises 20x in // bars BUE support.    Seated LAQ with adduction ball squeeze 15x  Hamstring curl seated GTB 15x each LE     Pt. response to medical necessity:  Patient will continue to benefit from skilled physical therapy to improve gait mechanics, strength, and balance for return to PLOF                               PT Education - 12/16/17 1549    Education provided  Yes    Education Details  safety with mobility, ambulation and gait mechanics, exercise technique    Person(s)  Educated  Patient    Methods  Explanation;Demonstration;Verbal cues    Comprehension  Verbalized understanding;Returned demonstration;Need further instruction       PT Short Term Goals - 12/09/17 1623      PT SHORT TERM GOAL #1   Title  Patient will be independent in home exercise program to improve strength/mobility for better functional independence with ADLs.    Baseline  HEP given     Time  2    Period  Weeks    Status  Partially Met    Target Date  12/23/17      PT SHORT TERM GOAL #2   Title  Patient will tolerate 5 seconds of single leg stance without loss of balance to improve ability to get in and out of shower safely.    Baseline  3 seconds    Time  2    Period  Weeks    Status  Partially Met    Target Date  12/23/17      PT SHORT TERM GOAL #3   Title  Patient will increase six minute walk test distance to 600 ft for progression to community ambulator and improve gait ability    Baseline  441 ft; 6/5: 793    Time  2    Period  Weeks    Status  Achieved      PT SHORT TERM GOAL #4   Title  Patient will increase BLE gross strength to 4+/5 as to improve functional strength for independent gait, increased standing tolerance and increased ADL ability.    Baseline  4-/5; 6/5 4/5    Time  2    Period  Weeks    Status  Achieved        PT Long Term Goals - 12/09/17 1627      PT LONG TERM GOAL #1   Title  Patient will demonstrate an improved Berg Balance Score of >50/56 as to demonstrate improved balance with ADLs such as sitting/standing and transfer balance and reduced fall risk    Baseline  42/56; 6/5: 47/56    Time  8    Period  Weeks    Status  Partially Met    Target Date  02/03/18      PT LONG TERM GOAL #2   Title  Patient will increase 10 meter walk test to >1.68ms as to improve gait speed for better community ambulation and to reduce fall risk    Baseline  .22m with quad cane; 65: .83 m/s with no  AD    Time  8    Period  Weeks    Status  Partially Met     Target Date  02/03/18      PT LONG TERM GOAL #3   Title  Patient will be modified independent in walking on even/uneven surface with least restrictive assistive device, for 20+ minutes without rest break, reporting some difficulty or less to improve walking tolerance with community ambulation including grocery shopping, going to [place].     Baseline  4/23: unable to walk that distance; 6/5: challenged with 6 minutes on stable surface    Time  8    Period  Weeks    Status  Partially Met    Target Date  02/03/18      PT LONG TERM GOAL #4   Title  Patient will increase six minute walk test distance to >800 for progression to community ambulator and improve gait ability    Baseline  4/23: 441 ft 6/5: 793 with 3 episodes of LOB and frequent shuffling of LEs.     Time  8    Period  Weeks    Status  Partially Met    Target Date  02/03/18            Plan - 12/16/17 1622    Clinical Impression Statement  Patient is challenged by single limb stance without UE support maintaining for ~3 seconds. Standing heel raises result in knee bend demonstrating poor gastrocnemius strength consistent with poor toe off during ambulation. Patient will continue to benefit from skilled physical therapy to improve gait mechanics, strength, and balance for return to PLOF    Rehab Potential  Fair    Clinical Impairments Affecting Rehab Potential  (+) motivation, family support  (-) age, difficulty hearing/remembering    PT Frequency  2x / week    PT Duration  8 weeks    PT Treatment/Interventions  ADLs/Self Care Home Management;Electrical Stimulation;Therapeutic activities;Functional mobility training;Stair training;Gait training;DME Instruction;Therapeutic exercise;Balance training;Neuromuscular re-education;Patient/family education;Manual techniques;Passive range of motion;Energy conservation;Taping    PT Next Visit Plan  conitnue progression of ambulation, stairs, increase leg press resistance or consider  single leg; add in dual tasking elements     PT Home Exercise Plan  see sheet    Consulted and Agree with Plan of Care  Patient       Patient will benefit from skilled therapeutic intervention in order to improve the following deficits and impairments:  Abnormal gait, Decreased activity tolerance, Decreased balance, Decreased knowledge of precautions, Decreased endurance, Decreased knowledge of use of DME, Decreased mobility, Decreased safety awareness, Difficulty walking, Decreased strength, Improper body mechanics  Visit Diagnosis: Muscle weakness (generalized)  Other abnormalities of gait and mobility  Unsteadiness on feet     Problem List Patient Active Problem List   Diagnosis Date Noted  . Hypertensive urgency 12/31/2016  . Stroke ([name]) 10/15/2016  . TIA (transient ischemic attack) 03/07/2016  . Acute CVA (cerebrovascular accident) ([place]) 07/03/2015   Janna Arch, PT, DPT   12/16/2017, 4:39 PM  [place] MAIN [facility] SERVICES [address] [place], [place], [address] Phone: [phone]   Fax:  [phone]  Name: Bryar Dahms MRN: [id number] Date of Birth: [birth year]

## 2017-12-21 ENCOUNTER — Ambulatory Visit: Payer: Medicare Other

## 2017-12-21 DIAGNOSIS — M6281 Muscle weakness (generalized): Secondary | ICD-10-CM

## 2017-12-21 DIAGNOSIS — R2689 Other abnormalities of gait and mobility: Secondary | ICD-10-CM

## 2017-12-21 NOTE — Therapy (Signed)
[place] MAIN [facility] SERVICES [address] [place], [place], [address] Phone: [phone]   Fax:  [phone]  Physical Therapy Treatment  Patient Details  Name: Christopher [name] MRN: [id number] Date of Birth: 19-Dec-1925 Referring Provider: Dr. Chryl Heck   Encounter Date: 12/21/2017  PT End of Session - 12/21/17 1640    Visit Number  13    Number of Visits  32    Date for PT Re-Evaluation  02/03/18    Authorization Type  3/10     PT Start Time  1601    PT Stop Time  1645    PT Time Calculation (min)  44 min    Equipment Utilized During Treatment  Gait belt    Activity Tolerance  Patient tolerated treatment well    Behavior During Therapy  [facility] for tasks assessed/performed       Past Medical History:  Diagnosis Date  . Arthritis   . Ataxia   . Diabetes mellitus without complication ([place])    controlled, pt checks it in the morning   . Dyslipidemia   . Hypertension    somewhat controlled, pt checks every morning, he reports it has been good.  . Stroke ([place])   . TIA (transient ischemic attack)     Past Surgical History:  Procedure Laterality Date  . none      There were no vitals filed for this visit.  Subjective Assessment - 12/21/17 1608    Subjective  Patient arrived without son today to interpret. Difficulty hearing today requiring frequent repetition of questions/cues.     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 2017, and CVA Dec 2016. Was seen at this facility by a different therapist until 03/2017. Patient reports that he was doing his exercises at home when his nurse would come. Now he reports he does not have a nurse coming to his house and does not do his exercises.  Reports that he is not walking as fast now as he was able to before or for as long of distances. Reports no falls.     Limitations  Walking;House hold activities;Other (comment)    How long can you sit comfortably?  n/a    How long can you stand comfortably?  n/a    How long can you walk comfortably?  across his house     Diagnostic tests  MR 6/27: IMPRESSION: No acute intracranial abnormality identified. Small chronic infarcts in basal ganglia, left paramedian pons,and right frontal subcortical white matter. Stable moderate chronic microvascular ischemic changes and mild parenchymal volume loss of the brain. Single interval punctate focus of microhemorrhage and left brachium pontis. Additional scattered stable chronic foci of microhemorrhage with central predominance, likely related to chronic hypertension.    Patient Stated Goals  Increase gait speed and walk for longer    Currently in Pain?  No/denies     Matrix resisted walking 7.5 lb forward, sideways, backwards 3x each direction ; CGA  Ambulate across unstable mat in // bars (red mat) single UE support 6x, cues for ambulating with higher knee raise of RLE ,  Forward lunges bosu ball 10x each leg finger tip support  Side step lunge/squat bosu ball 10x each leg    Single leg leg press  BLE (one at a time); #60 2x15 BLE    Single  limb march no UE support 2x10 each LE.      Standing heel raises toe raises 20x in // bars BUE support.    Seated LAQ with adduction ball squeeze 15x   Hamstring curl seated GTB 15x each LE     Pt. response to medical necessity:  Patient will continue to benefit from skilled physical therapy to improve gait mechanics, strength, and balance for return to PLOF                         PT Education - 12/21/17 1639    Education provided  Yes    Education Details  hip flexion, exercise technique     Person(s) Educated  Patient    Methods  Explanation;Demonstration;Verbal cues    Comprehension  Verbalized understanding;Returned demonstration       PT Short Term Goals - 12/09/17 1623      PT SHORT  TERM GOAL #1   Title  Patient will be independent in home exercise program to improve strength/mobility for better functional independence with ADLs.    Baseline  HEP given     Time  2    Period  Weeks    Status  Partially Met    Target Date  12/23/17      PT SHORT TERM GOAL #2   Title  Patient will tolerate 5 seconds of single leg stance without loss of balance to improve ability to get in and out of shower safely.    Baseline  3 seconds    Time  2    Period  Weeks    Status  Partially Met    Target Date  12/23/17      PT SHORT TERM GOAL #3   Title  Patient will increase six minute walk test distance to 600 ft for progression to community ambulator and improve gait ability    Baseline  441 ft; 6/5: 793    Time  2    Period  Weeks    Status  Achieved      PT SHORT TERM GOAL #4   Title  Patient will increase BLE gross strength to 4+/5 as to improve functional strength for independent gait, increased standing tolerance and increased ADL ability.    Baseline  4-/5; 6/5 4/5    Time  2    Period  Weeks    Status  Achieved        PT Long Term Goals - 12/09/17 1627      PT LONG TERM GOAL #1   Title  Patient will demonstrate an improved Berg Balance Score of >50/56 as to demonstrate improved balance with ADLs such as sitting/standing and transfer balance and reduced fall risk    Baseline  42/56; 6/5: 47/56    Time  8    Period  Weeks    Status  Partially Met    Target Date  02/03/18      PT LONG TERM GOAL #2   Title  Patient will increase 10 meter walk test to >1.32ms as to improve gait speed for better community ambulation and to reduce fall risk    Baseline  .260m with quad cane; 65: .83 m/s with no AD    Time  8    Period  Weeks    Status  Partially Met    Target Date  02/03/18      PT LONG TERM GOAL #3   Title  Patient will be  modified independent in walking on even/uneven surface with least restrictive assistive device, for 20+ minutes without rest break, reporting  some difficulty or less to improve walking tolerance with community ambulation including grocery shopping, going to [place].     Baseline  4/23: unable to walk that distance; 6/5: challenged with 6 minutes on stable surface    Time  8    Period  Weeks    Status  Partially Met    Target Date  02/03/18      PT LONG TERM GOAL #4   Title  Patient will increase six minute walk test distance to >800 for progression to community ambulator and improve gait ability    Baseline  4/23: 441 ft 6/5: 793 with 3 episodes of LOB and frequent shuffling of LEs.     Time  8    Period  Weeks    Status  Partially Met    Target Date  02/03/18            Plan - 12/21/17 1640    Clinical Impression Statement  Patient challenged by right hip flexion requiring over-exaggeration to clear foot. Patient demonstrating improved single limb stance via dynamic single leg marches without UE support with decreased episodes of LOB and improved stability with movement.  Patient will continue to benefit from skilled physical therapy to improve gait mechanics, strength, and balance for return to PLOF    Rehab Potential  Fair    Clinical Impairments Affecting Rehab Potential  (+) motivation, family support  (-) age, difficulty hearing/remembering    PT Frequency  2x / week    PT Duration  8 weeks    PT Treatment/Interventions  ADLs/Self Care Home Management;Electrical Stimulation;Therapeutic activities;Functional mobility training;Stair training;Gait training;DME Instruction;Therapeutic exercise;Balance training;Neuromuscular re-education;Patient/family education;Manual techniques;Passive range of motion;Energy conservation;Taping    PT Next Visit Plan  conitnue progression of ambulation, stairs, increase leg press resistance or consider single leg; add in dual tasking elements     PT Home Exercise Plan  see sheet    Consulted and Agree with Plan of Care  Patient       Patient will benefit from skilled therapeutic  intervention in order to improve the following deficits and impairments:  Abnormal gait, Decreased activity tolerance, Decreased balance, Decreased knowledge of precautions, Decreased endurance, Decreased knowledge of use of DME, Decreased mobility, Decreased safety awareness, Difficulty walking, Decreased strength, Improper body mechanics  Visit Diagnosis: Muscle weakness (generalized)  Other abnormalities of gait and mobility     Problem List Patient Active Problem List   Diagnosis Date Noted  . Hypertensive urgency 12/31/2016  . Stroke ([place]) 10/15/2016  . TIA (transient ischemic attack) 03/07/2016  . Acute CVA (cerebrovascular accident) ([place]) 07/03/2015   Janna Arch, PT, DPT   12/21/2017, 4:59 PM  [place] MAIN [facility] SERVICES [address] [place], [place], [address] Phone: [phone]   Fax:  [phone]  Name: Draylen Lobue MRN: [id number] Date of Birth: Dec 31, 1925

## 2017-12-23 ENCOUNTER — Ambulatory Visit: Payer: Medicare Other

## 2017-12-23 DIAGNOSIS — M6281 Muscle weakness (generalized): Secondary | ICD-10-CM

## 2017-12-23 DIAGNOSIS — R2689 Other abnormalities of gait and mobility: Secondary | ICD-10-CM

## 2017-12-23 DIAGNOSIS — R2681 Unsteadiness on feet: Secondary | ICD-10-CM

## 2017-12-23 NOTE — Therapy (Signed)
[place] MAIN [facility] SERVICES [address] [place], [place], [address] Phone: [phone]   Fax:  [phone]  Physical Therapy Treatment  Patient Details  Name: Christopher [name] MRN: [id number] Date of Birth: 05-14-1926 Referring Provider: Dr. Chryl Heck   Encounter Date: 12/23/2017  PT End of Session - 12/24/17 1129    Visit Number  14    Number of Visits  32    Date for PT Re-Evaluation  02/03/18    Authorization Type  4/10     PT Start Time  0175    PT Stop Time  1645    PT Time Calculation (min)  28 min    Equipment Utilized During Treatment  Gait belt    Activity Tolerance  Patient tolerated treatment well    Behavior During Therapy  [facility] for tasks assessed/performed       Past Medical History:  Diagnosis Date  . Arthritis   . Ataxia   . Diabetes mellitus without complication ([place])    controlled, pt checks it in the morning   . Dyslipidemia   . Hypertension    somewhat controlled, pt checks every morning, he reports it has been good.  . Stroke ([place])   . TIA (transient ischemic attack)     Past Surgical History:  Procedure Laterality Date  . none      There were no vitals filed for this visit.  Subjective Assessment - 12/24/17 1128    Subjective  Patient arrived late to session limiting session length. Son reports it was due to him having a meeting.     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 2017, and CVA Dec 2016. Was seen at this facility by a different therapist until 03/2017. Patient reports that he was doing his exercises at home when his nurse would come. Now he reports he does not have a nurse coming to his house and does not do his exercises.  Reports that he is not walking as fast now as he was able to before or for as long of distances. Reports no falls.     Limitations   Walking;House hold activities;Other (comment)    How long can you sit comfortably?  n/a    How long can you stand comfortably?  n/a    How long can you walk comfortably?  across his house     Diagnostic tests  MR 6/27: IMPRESSION: No acute intracranial abnormality identified. Small chronic infarcts in basal ganglia, left paramedian pons,and right frontal subcortical white matter. Stable moderate chronic microvascular ischemic changes and mild parenchymal volume loss of the brain. Single interval punctate focus of microhemorrhage and left brachium pontis. Additional scattered stable chronic foci of microhemorrhage with central predominance, likely related to chronic hypertension.    Patient Stated Goals  Increase gait speed and walk for longer    Currently in Pain?  No/denies     Neuro Re-ed  Airex pad standing on with 4" step toe taps 10x each leg; no UE support  Side toe taps on airex pad onto 4" step 15x each leg no UE support    Lunges onto Bosu ball 10x each leg  Side lunges onto bosu ball 10x each leg  Kick soccer ball 10x each leg in // bars SUE support  Dribble between feet to promote coordination and  single limb stance then pass x 10 each leg in // bars ; SUE support  Step over two consecutive orange hurdles x 6 trials in // bars   PT direct patient to tap colored mini bosu with tactile for L vs R leg, challenge with crossing midline x 4 minutes  Ambulate backwards in // bars x4 trials.                           PT Education - 12/24/17 1129    Education provided  Yes    Education Details  hip flexion, exercise technique, stabilizatoin     Person(s) Educated  Patient    Methods  Explanation;Demonstration;Verbal cues    Comprehension  Need further instruction;Verbalized understanding;Returned demonstration       PT Short Term Goals - 12/09/17 1623      PT SHORT TERM GOAL #1   Title  Patient will be independent in home exercise program to improve  strength/mobility for better functional independence with ADLs.    Baseline  HEP given     Time  2    Period  Weeks    Status  Partially Met    Target Date  12/23/17      PT SHORT TERM GOAL #2   Title  Patient will tolerate 5 seconds of single leg stance without loss of balance to improve ability to get in and out of shower safely.    Baseline  3 seconds    Time  2    Period  Weeks    Status  Partially Met    Target Date  12/23/17      PT SHORT TERM GOAL #3   Title  Patient will increase six minute walk test distance to 600 ft for progression to community ambulator and improve gait ability    Baseline  441 ft; 6/5: 793    Time  2    Period  Weeks    Status  Achieved      PT SHORT TERM GOAL #4   Title  Patient will increase BLE gross strength to 4+/5 as to improve functional strength for independent gait, increased standing tolerance and increased ADL ability.    Baseline  4-/5; 6/5 4/5    Time  2    Period  Weeks    Status  Achieved        PT Long Term Goals - 12/09/17 1627      PT LONG TERM GOAL #1   Title  Patient will demonstrate an improved Berg Balance Score of >50/56 as to demonstrate improved balance with ADLs such as sitting/standing and transfer balance and reduced fall risk    Baseline  42/56; 6/5: 47/56    Time  8    Period  Weeks    Status  Partially Met    Target Date  02/03/18      PT LONG TERM GOAL #2   Title  Patient will increase 10 meter walk test to >1.56ms as to improve gait speed for better community ambulation and to reduce fall risk    Baseline  .259m with quad cane; 65: .83 m/s with no AD    Time  8    Period  Weeks    Status  Partially Met    Target Date  02/03/18      PT LONG TERM GOAL #3   Title  Patient will be modified independent in walking on even/uneven surface with  least restrictive assistive device, for 20+ minutes without rest break, reporting some difficulty or less to improve walking tolerance with community ambulation including  grocery shopping, going to [place]  4/23: unable to walk that distance; 6/5: challenged with 6 minutes on stable surface    Time  8    Period  Weeks    Status  Partially Met    Target Date  02/03/18      PT LONG TERM GOAL #4   Title  Patient will increase six minute walk test distance to >800 for progression to community ambulator and improve gait ability    Baseline  4/23: 441 ft 6/5: 793 with 3 episodes of LOB and frequent shuffling of LEs.     Time  8    Period  Weeks    Status  Partially Met    Target Date  02/03/18            Plan - 12/24/17 1130    Clinical Impression Statement  Patient arrived late to session today limiting duration of interventions. Focus on single limb stability was challenging to patient with patient improving with cueing for control/sequencing. benefit from skilled physical therapy to improve gait mechanics, strength, and balance for return to PLOF    Rehab Potential  Fair    Clinical Impairments Affecting Rehab Potential  (+) motivation, family support  (-) age, difficulty hearing/remembering    PT Frequency  2x / week    PT Duration  8 weeks    PT Treatment/Interventions  ADLs/Self Care Home Management;Electrical Stimulation;Therapeutic activities;Functional mobility training;Stair training;Gait training;DME Instruction;Therapeutic exercise;Balance training;Neuromuscular re-education;Patient/family education;Manual techniques;Passive range of motion;Energy conservation;Taping    PT Next Visit Plan  conitnue progression of ambulation, stairs, increase leg press resistance or consider single leg; add in dual tasking elements     PT Home Exercise Plan  see sheet    Consulted and Agree with Plan of Care  Patient       Patient will benefit from skilled therapeutic intervention in order to improve the following deficits and impairments:  Abnormal gait, Decreased activity tolerance, Decreased balance, Decreased knowledge of precautions,  Decreased endurance, Decreased knowledge of use of DME, Decreased mobility, Decreased safety awareness, Difficulty walking, Decreased strength, Improper body mechanics  Visit Diagnosis: Muscle weakness (generalized)  Other abnormalities of gait and mobility  Unsteadiness on feet     Problem List Patient Active Problem List   Diagnosis Date Noted  . Hypertensive urgency 12/31/2016  . Stroke ([place]) 10/15/2016  . TIA (transient ischemic attack) 03/07/2016  . Acute CVA (cerebrovascular accident) ([place]) 07/03/2015  Janna Arch, PT, DPT   12/24/2017, 11:34 AM  [place] MAIN [facility] SERVICES [address] [place], [place], [address] Phone: [phone]   Fax:  [phone]  Name: Tad Fancher MRN: [id number] Date of Birth: 07-21-1925

## 2017-12-28 ENCOUNTER — Ambulatory Visit: Payer: Medicare Other

## 2017-12-28 DIAGNOSIS — M6281 Muscle weakness (generalized): Secondary | ICD-10-CM

## 2017-12-28 DIAGNOSIS — R2689 Other abnormalities of gait and mobility: Secondary | ICD-10-CM

## 2017-12-28 DIAGNOSIS — R2681 Unsteadiness on feet: Secondary | ICD-10-CM

## 2017-12-28 NOTE — Therapy (Signed)
[place] MAIN [facility] SERVICES [address] [place], [place], [address] Phone: [phone]   Fax:  [phone]  Physical Therapy Treatment  Patient Details  Name: Christopher [name] MRN: [id number] Date of Birth: [birth year] Referring Provider: Dr. Chryl Heck   Encounter Date: 12/28/2017  PT End of Session - 12/28/17 1604    Visit Number  15    Number of Visits  32    Date for PT Re-Evaluation  02/03/18    Authorization Type  5/10     PT Start Time  1600    PT Stop Time  1644    PT Time Calculation (min)  44 min    Equipment Utilized During Treatment  Gait belt    Activity Tolerance  Patient tolerated treatment well    Behavior During Therapy  [facility] for tasks assessed/performed       Past Medical History:  Diagnosis Date  . Arthritis   . Ataxia   . Diabetes mellitus without complication ([place])    controlled, pt checks it in the morning   . Dyslipidemia   . Hypertension    somewhat controlled, pt checks every morning, he reports it has been good.  . Stroke ([place])   . TIA (transient ischemic attack)     Past Surgical History:  Procedure Laterality Date  . none      There were no vitals filed for this visit.  Subjective Assessment - 12/28/17 1603    Subjective  Patient arrived early to session allowing for full session length. Patient reports having difficulty hearing and understanding language today.     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 2017, and CVA Dec 2016. Was seen at this facility by a different therapist until 03/2017. Patient reports that he was doing his exercises at home when his nurse would come. Now he reports he does not have a nurse coming to his house and does not do his exercises.  Reports that he is not walking as fast now as he was able to before or for as long of distances.  Reports no falls.     Limitations  Walking;House hold activities;Other (comment)    How long can you sit comfortably?  n/a    How long can you stand comfortably?  n/a    How long can you walk comfortably?  across his house     Diagnostic tests  MR 6/27: IMPRESSION: No acute intracranial abnormality identified. Small chronic infarcts in basal ganglia, left paramedian pons,and right frontal subcortical white matter. Stable moderate chronic microvascular ischemic changes and mild parenchymal volume loss of the brain. Single interval punctate focus of microhemorrhage and left brachium pontis. Additional scattered stable chronic foci of microhemorrhage with central predominance, likely related to chronic hypertension.    Patient Stated Goals  Increase gait speed and walk for longer    Currently in Pain?  No/denies       Neuro Re-ed   Airex pad standing on with 6" step toe taps 10x each leg; no UE support   Side toe taps on airex pad onto 6" step 15x each leg no UE support    Lunges onto Bosu ball 15x each leg   Side lunges onto bosu ball 15x each leg   Weighted ball (2000 Gr) standing overhead raises 10x horizontal pathway 10x, press  10x no LOB    Step over two consecutive orange hurdles x 8 trials in // bars      Ambulate backwards in // bars x4 trials  5lb ankle weights  Standing hip flexion 15x each leg 2 sets ; SUE support   Standing abduction 15x each leg 2 sets; SUE support  Seated LAQ 10x each leg 5 second holds x2 sets   Standing hip extension 15x each leg 2 sets SUE support   Pt. response to medical necessity: Patient will continue to benefit from skilled physical therapy to improve gait mechanics, strength, and balance for return to PLOF                      PT Education - 12/28/17 1603    Education provided  Yes    Education Details  exercise technique, stabilization     Person(s) Educated  Patient    Methods  Explanation;Demonstration;Verbal cues     Comprehension  Verbalized understanding;Returned demonstration       PT Short Term Goals - 12/09/17 1623      PT SHORT TERM GOAL #1   Title  Patient will be independent in home exercise program to improve strength/mobility for better functional independence with ADLs.    Baseline  HEP given     Time  2    Period  Weeks    Status  Partially Met    Target Date  12/23/17      PT SHORT TERM GOAL #2   Title  Patient will tolerate 5 seconds of single leg stance without loss of balance to improve ability to get in and out of shower safely.    Baseline  3 seconds    Time  2    Period  Weeks    Status  Partially Met    Target Date  12/23/17      PT SHORT TERM GOAL #3   Title  Patient will increase six minute walk test distance to 600 ft for progression to community ambulator and improve gait ability    Baseline  441 ft; 6/5: 793    Time  2    Period  Weeks    Status  Achieved      PT SHORT TERM GOAL #4   Title  Patient will increase BLE gross strength to 4+/5 as to improve functional strength for independent gait, increased standing tolerance and increased ADL ability.    Baseline  4-/5; 6/5 4/5    Time  2    Period  Weeks    Status  Achieved        PT Long Term Goals - 12/09/17 1627      PT LONG TERM GOAL #1   Title  Patient will demonstrate an improved Berg Balance Score of >50/56 as to demonstrate improved balance with ADLs such as sitting/standing and transfer balance and reduced fall risk    Baseline  42/56; 6/5: 47/56    Time  8    Period  Weeks    Status  Partially Met    Target Date  02/03/18      PT LONG TERM GOAL #2   Title  Patient will increase 10 meter walk test to >1.8ms as to improve gait speed for better community ambulation and to reduce fall risk    Baseline  .258m with quad cane; 65: .83 m/s with no AD    Time  8    Period  Weeks  Status  Partially Met    Target Date  02/03/18      PT LONG TERM GOAL #3   Title  Patient will be modified  independent in walking on even/uneven surface with least restrictive assistive device, for 20+ minutes without rest break, reporting some difficulty or less to improve walking tolerance with community ambulation including grocery shopping, going to [place].     Baseline  4/23: unable to walk that distance; 6/5: challenged with 6 minutes on stable surface    Time  8    Period  Weeks    Status  Partially Met    Target Date  02/03/18      PT LONG TERM GOAL #4   Title  Patient will increase six minute walk test distance to >800 for progression to community ambulator and improve gait ability    Baseline  4/23: 441 ft 6/5: 793 with 3 episodes of LOB and frequent shuffling of LEs.     Time  8    Period  Weeks    Status  Partially Met    Target Date  02/03/18            Plan - 12/28/17 1636    Clinical Impression Statement  Patient demonstrated improved stability with BLE when performing stair taps on unstable surface with decreased posterior LOB. Patient challenged with placement of feet after clearing obstacles resulting in occasional trippage with patient catching balance independently. Patient will continue to benefit from skilled physical therapy to improve gait mechanics, strength, and balance for return to PLOF    Rehab Potential  Fair    Clinical Impairments Affecting Rehab Potential  (+) motivation, family support  (-) age, difficulty hearing/remembering    PT Frequency  2x / week    PT Duration  8 weeks    PT Treatment/Interventions  ADLs/Self Care Home Management;Electrical Stimulation;Therapeutic activities;Functional mobility training;Stair training;Gait training;DME Instruction;Therapeutic exercise;Balance training;Neuromuscular re-education;Patient/family education;Manual techniques;Passive range of motion;Energy conservation;Taping    PT Next Visit Plan  conitnue progression of ambulation, stairs, increase leg press resistance or consider single leg; add in dual tasking  elements     PT Home Exercise Plan  see sheet    Consulted and Agree with Plan of Care  Patient       Patient will benefit from skilled therapeutic intervention in order to improve the following deficits and impairments:  Abnormal gait, Decreased activity tolerance, Decreased balance, Decreased knowledge of precautions, Decreased endurance, Decreased knowledge of use of DME, Decreased mobility, Decreased safety awareness, Difficulty walking, Decreased strength, Improper body mechanics  Visit Diagnosis: Muscle weakness (generalized)  Other abnormalities of gait and mobility  Unsteadiness on feet     Problem List Patient Active Problem List   Diagnosis Date Noted  . Hypertensive urgency 12/31/2016  . Stroke ([place]) 10/15/2016  . TIA (transient ischemic attack) 03/07/2016  . Acute CVA (cerebrovascular accident) ([place]) 07/03/2015   Janna Arch, PT, DPT   12/28/2017, 4:44 PM  [place] MAIN [facility] SERVICES [address] [place], [place], [address] Phone: [phone]   Fax:  [phone]  Name: Christopher [name] MRN: [id number] Date of Birth: Nov 14, 1925

## 2017-12-30 ENCOUNTER — Ambulatory Visit: Payer: Medicare Other

## 2017-12-30 DIAGNOSIS — R2689 Other abnormalities of gait and mobility: Secondary | ICD-10-CM

## 2017-12-30 DIAGNOSIS — M6281 Muscle weakness (generalized): Secondary | ICD-10-CM

## 2017-12-30 DIAGNOSIS — R2681 Unsteadiness on feet: Secondary | ICD-10-CM

## 2017-12-30 NOTE — Therapy (Signed)
[place] MAIN [facility] SERVICES [address] [place], [place], [address] Phone: [phone]   Fax:  [phone]  Physical Therapy Treatment  Patient Details  Name: Christopher [name] MRN: [id number] Date of Birth: May 13, 1926 Referring Provider: Dr. Chryl Heck   Encounter Date: 12/30/2017  PT End of Session - 12/30/17 1603    Visit Number  16    Number of Visits  32    Date for PT Re-Evaluation  02/03/18    Authorization Type  6/10     PT Start Time  1600    PT Stop Time  1644    PT Time Calculation (min)  44 min    Equipment Utilized During Treatment  Gait belt    Activity Tolerance  Patient tolerated treatment well    Behavior During Therapy  [facility] for tasks assessed/performed       Past Medical History:  Diagnosis Date  . Arthritis   . Ataxia   . Diabetes mellitus without complication ([place])    controlled, pt checks it in the morning   . Dyslipidemia   . Hypertension    somewhat controlled, pt checks every morning, he reports it has been good.  . Stroke ([place])   . TIA (transient ischemic attack)     Past Surgical History:  Procedure Laterality Date  . none      There were no vitals filed for this visit.  Subjective Assessment - 12/30/17 1602    Subjective  Patient reports no pain today, reports he is feeling well. Has been exercising in the  mornings.     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 2017, and CVA Dec 2016. Was seen at this facility by a different therapist until 03/2017. Patient reports that he was doing his exercises at home when his nurse would come. Now he reports he does not have a nurse coming to his house and does not do his exercises.  Reports that he is not walking as fast now as he was able to before or for as long of distances. Reports no falls.     Limitations  Walking;House  hold activities;Other (comment)    How long can you sit comfortably?  n/a    How long can you stand comfortably?  n/a    How long can you walk comfortably?  across his house     Diagnostic tests  MR 6/27: IMPRESSION: No acute intracranial abnormality identified. Small chronic infarcts in basal ganglia, left paramedian pons,and right frontal subcortical white matter. Stable moderate chronic microvascular ischemic changes and mild parenchymal volume loss of the brain. Single interval punctate focus of microhemorrhage and left brachium pontis. Additional scattered stable chronic foci of microhemorrhage with central predominance, likely related to chronic hypertension.    Patient Stated Goals  Increase gait speed and walk for longer    Currently in Pain?  No/denies     Nustep Lvl 5 4 minutes RPM >60 for cardiovascular challenge   Neuro Re-ed  Airex pad standing on with 6" step toe taps 20x each leg; no UE support  Side toe taps on airex pad onto 6" step 15x each leg no UE support    Lunges onto Bosu ball 15x each leg  Side lunges onto bosu ball 15x each leg  Kick soccer ball 10x each leg in //  bars no UE support; occasional LOB,    Step over two consecutive orange hurdles x 6 trials in // bars ; no UE support     TherEx  Ambulate backwards in // bars x4 trials  5lb ankle weights             Standing hip flexion 15x each leg 2 sets ; SUE support              Standing abduction 15x each leg 2 sets; SUE support; Require min tactile assistance for pelvic alignment for LLE kicking             Seated LAQ 10x each leg 5 second holds x2 sets              Standing hip extension 15x each leg 2 sets SUE support   Standing marches 15x each leg 2 sets, SUE support   Pt. response to medical necessity: Patient will continue to benefit from skilled physical therapy to improve gait mechanics, strength, and balance for return to PLOF                         PT  Education - 12/30/17 1603    Education provided  Yes    Education Details  exercise technique, stabilization     Person(s) Educated  Patient    Methods  Explanation;Demonstration;Verbal cues    Comprehension  Verbalized understanding;Returned demonstration       PT Short Term Goals - 12/09/17 1623      PT SHORT TERM GOAL #1   Title  Patient will be independent in home exercise program to improve strength/mobility for better functional independence with ADLs.    Baseline  HEP given     Time  2    Period  Weeks    Status  Partially Met    Target Date  12/23/17      PT SHORT TERM GOAL #2   Title  Patient will tolerate 5 seconds of single leg stance without loss of balance to improve ability to get in and out of shower safely.    Baseline  3 seconds    Time  2    Period  Weeks    Status  Partially Met    Target Date  12/23/17      PT SHORT TERM GOAL #3   Title  Patient will increase six minute walk test distance to 600 ft for progression to community ambulator and improve gait ability    Baseline  441 ft; 6/5: 793    Time  2    Period  Weeks    Status  Achieved      PT SHORT TERM GOAL #4   Title  Patient will increase BLE gross strength to 4+/5 as to improve functional strength for independent gait, increased standing tolerance and increased ADL ability.    Baseline  4-/5; 6/5 4/5    Time  2    Period  Weeks    Status  Achieved        PT Long Term Goals - 12/09/17 1627      PT LONG TERM GOAL #1   Title  Patient will demonstrate an improved Berg Balance Score of >50/56 as to demonstrate improved balance with ADLs such as sitting/standing and transfer balance and reduced fall risk    Baseline  42/56; 6/5: 47/56    Time  8    Period  Weeks  Status  Partially Met    Target Date  02/03/18      PT LONG TERM GOAL #2   Title  Patient will increase 10 meter walk test to >1.59ms as to improve gait speed for better community ambulation and to reduce fall risk    Baseline   .271m with quad cane; 65: .83 m/s with no AD    Time  8    Period  Weeks    Status  Partially Met    Target Date  02/03/18      PT LONG TERM GOAL #3   Title  Patient will be modified independent in walking on even/uneven surface with least restrictive assistive device, for 20+ minutes without rest break, reporting some difficulty or less to improve walking tolerance with community ambulation including grocery shopping, going to church,etc.     Baseline  4/23: unable to walk that distance; 6/5: challenged with 6 minutes on stable surface    Time  8    Period  Weeks    Status  Partially Met    Target Date  02/03/18      PT LONG TERM GOAL #4   Title  Patient will increase six minute walk test distance to >800 for progression to community ambulator and improve gait ability    Baseline  4/23: 441 ft 6/5: 793 with 3 episodes of LOB and frequent shuffling of LEs.     Time  8    Period  Weeks    Status  Partially Met    Target Date  02/03/18            Plan - 12/30/17 1629    Clinical Impression Statement  Patient has slight posterior trunk lean when performing dynamic exercises on unstable surfaces today, however is demonstrating improved clearance of bilateral LE indicating abdominal need for strengthening. Patient will continue to benefit from skilled physical therapy to improve gait mechanics, strength, and balance for return to PLOF    Rehab Potential  Fair    Clinical Impairments Affecting Rehab Potential  (+) motivation, family support  (-) age, difficulty hearing/remembering    PT Frequency  2x / week    PT Duration  8 weeks    PT Treatment/Interventions  ADLs/Self Care Home Management;Electrical Stimulation;Therapeutic activities;Functional mobility training;Stair training;Gait training;DME Instruction;Therapeutic exercise;Balance training;Neuromuscular re-education;Patient/family education;Manual techniques;Passive range of motion;Energy conservation;Taping    PT Next Visit  Plan  conitnue progression of ambulation, stairs, increase leg press resistance or consider single leg; add in dual tasking elements     PT Home Exercise Plan  see sheet    Consulted and Agree with Plan of Care  Patient       Patient will benefit from skilled therapeutic intervention in order to improve the following deficits and impairments:  Abnormal gait, Decreased activity tolerance, Decreased balance, Decreased knowledge of precautions, Decreased endurance, Decreased knowledge of use of DME, Decreased mobility, Decreased safety awareness, Difficulty walking, Decreased strength, Improper body mechanics  Visit Diagnosis: Muscle weakness (generalized)  Other abnormalities of gait and mobility  Unsteadiness on feet     Problem List Patient Active Problem List   Diagnosis Date Noted  . Hypertensive urgency 12/31/2016  . Stroke ([place]04/05/2017  . TIA (transient ischemic attack) 03/07/2016  . Acute CVA (cerebrovascular accident) ([place]CL[place]12/27/2016   MaJanna ArchPT, DPT   12/30/2017, 5:43 PM  CoCastalian SpringsAIN RESt. Claire Regional Medical CenterERVICES [address]dGar[place]hone: [phone] Fax:  [phone]  Name: Christopher [name] MRN: [id number] Date of Birth: 07-31-1925

## 2018-01-04 ENCOUNTER — Ambulatory Visit: Payer: Medicare Other | Attending: Family Medicine

## 2018-01-04 DIAGNOSIS — R2681 Unsteadiness on feet: Secondary | ICD-10-CM | POA: Insufficient documentation

## 2018-01-04 DIAGNOSIS — R269 Unspecified abnormalities of gait and mobility: Secondary | ICD-10-CM | POA: Insufficient documentation

## 2018-01-04 DIAGNOSIS — R2689 Other abnormalities of gait and mobility: Secondary | ICD-10-CM | POA: Insufficient documentation

## 2018-01-04 DIAGNOSIS — M6281 Muscle weakness (generalized): Secondary | ICD-10-CM

## 2018-01-04 NOTE — Therapy (Signed)
[place] MAIN [facility] SERVICES [address] [place], [place], [address] Phone: [phone]   Fax:  [phone]  Physical Therapy Treatment  Patient Details  Name: Christopher [name] MRN: [id number] Date of Birth: 03/31/1926 Referring Provider: Dr. Chryl Heck   Encounter Date: 01/04/2018  PT End of Session - 01/04/18 1615    Visit Number  17    Number of Visits  32    Date for PT Re-Evaluation  02/03/18    Authorization Type  7/10     PT Start Time  2395    PT Stop Time  1645    PT Time Calculation (min)  38 min    Equipment Utilized During Treatment  Gait belt    Activity Tolerance  Patient tolerated treatment well    Behavior During Therapy  WFL for tasks assessed/performed       Past Medical History:  Diagnosis Date  . Arthritis   . Ataxia   . Diabetes mellitus without complication ([place])    controlled, pt checks it in the morning   . Dyslipidemia   . Hypertension    somewhat controlled, pt checks every morning, he reports it has been good.  . Stroke ([place])   . TIA (transient ischemic attack)     Past Surgical History:  Procedure Laterality Date  . none      There were no vitals filed for this visit.  Subjective Assessment - 01/04/18 1612    Subjective  Patient reports no pain or falls since last session.  Is late due to son being in a meeting.     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 2017, and CVA Dec 2016. Was seen at this facility by a different therapist until 03/2017. Patient reports that he was doing his exercises at home when his nurse would come. Now he reports he does not have a nurse coming to his house and does not do his exercises.  Reports that he is not walking as fast now as he was able to before or for as long of distances. Reports no falls.     Limitations  Walking;House hold  activities;Other (comment)    How long can you sit comfortably?  n/a    How long can you stand comfortably?  n/a    How long can you walk comfortably?  across his house     Diagnostic tests  MR 6/27: IMPRESSION: No acute intracranial abnormality identified. Small chronic infarcts in basal ganglia, left paramedian pons,and right frontal subcortical white matter. Stable moderate chronic microvascular ischemic changes and mild parenchymal volume loss of the brain. Single interval punctate focus of microhemorrhage and left brachium pontis. Additional scattered stable chronic foci of microhemorrhage with central predominance, likely related to chronic hypertension.    Patient Stated Goals  Increase gait speed and walk for longer    Currently in Pain?  No/denies        Neuro Re-ed      Lunges onto Bosu ball 15x each leg; no UE support    Side lunges onto bosu ball 15x each leg; no UE support   Step over and back orange hurdle no UE support 10x eaach leg        TherEx   Single leg leg press  #60 1x15 BLE (one at a time) #75 1x10  Side step in // bars 4x length of bars GTB around ankles  Ambulate backwards in // bars x4 trials   10 hover squats in // bars no UE support, CGA cues for keeping chest up via tactile cueing              Seated LAQ 10x each leg 5 second holds x2 sets              Standing hip extension 15x each leg 2 sets SUE support              Standing marches 15x each leg 2 sets, SUE support   Ambulate 96 ft without AD with CGA cues for hip flexion to decrease foot drag.    Pt. response to medical necessity: Patient will continue to benefit from skilled physical therapy to improve gait mechanics, strength, and balance for return to PLOF                         PT Education - 01/04/18 1615    Education provided  Yes    Education Details  exercise technique, stability     Person(s) Educated  Patient    Methods  Explanation;Demonstration;Verbal  cues    Comprehension  Verbalized understanding;Returned demonstration       PT Short Term Goals - 12/09/17 1623      PT SHORT TERM GOAL #1   Title  Patient will be independent in home exercise program to improve strength/mobility for better functional independence with ADLs.    Baseline  HEP given     Time  2    Period  Weeks    Status  Partially Met    Target Date  12/23/17      PT SHORT TERM GOAL #2   Title  Patient will tolerate 5 seconds of single leg stance without loss of balance to improve ability to get in and out of shower safely.    Baseline  3 seconds    Time  2    Period  Weeks    Status  Partially Met    Target Date  12/23/17      PT SHORT TERM GOAL #3   Title  Patient will increase six minute walk test distance to 600 ft for progression to community ambulator and improve gait ability    Baseline  441 ft; 6/5: 793    Time  2    Period  Weeks    Status  Achieved      PT SHORT TERM GOAL #4   Title  Patient will increase BLE gross strength to 4+/5 as to improve functional strength for independent gait, increased standing tolerance and increased ADL ability.    Baseline  4-/5; 6/5 4/5    Time  2    Period  Weeks    Status  Achieved        PT Long Term Goals - 12/09/17 1627      PT LONG TERM GOAL #1   Title  Patient will demonstrate an improved Berg Balance Score of >50/56 as to demonstrate improved balance with ADLs such as sitting/standing and transfer balance and reduced fall risk    Baseline  42/56; 6/5: 47/56    Time  8    Period  Weeks    Status  Partially Met    Target Date  02/03/18      PT LONG TERM GOAL #2   Title  Patient will  increase 10 meter walk test to >1.60ms as to improve gait speed for better community ambulation and to reduce fall risk    Baseline  .260m with quad cane; 65: .83 m/s with no AD    Time  8    Period  Weeks    Status  Partially Met    Target Date  02/03/18      PT LONG TERM GOAL #3   Title  Patient will be modified  independent in walking on even/uneven surface with least restrictive assistive device, for 20+ minutes without rest break, reporting some difficulty or less to improve walking tolerance with community ambulation including grocery shopping, going to church,etc.     Baseline  4/23: unable to walk that distance; 6/5: challenged with 6 minutes on stable surface    Time  8    Period  Weeks    Status  Partially Met    Target Date  02/03/18      PT LONG TERM GOAL #4   Title  Patient will increase six minute walk test distance to >800 for progression to community ambulator and improve gait ability    Baseline  4/23: 441 ft 6/5: 793 with 3 episodes of LOB and frequent shuffling of LEs.     Time  8    Period  Weeks    Status  Partially Met    Target Date  02/03/18            Plan - 01/04/18 1637    Clinical Impression Statement  Patient arrived late to session however was eager to participate throughout session in strengthening/stability interventions. Decreased need for correction/cuing for sequencing required demonstrating improved stability and strength of bilateral LE musculature. Single leg stability continues to challenge patient. Patient will continue to benefit from skilled physical therapy to improve gait mechanics, strength, and balance for return to PLOF    Rehab Potential  Fair    Clinical Impairments Affecting Rehab Potential  (+) motivation, family support  (-) age, difficulty hearing/remembering    PT Frequency  2x / week    PT Duration  8 weeks    PT Treatment/Interventions  ADLs/Self Care Home Management;Electrical Stimulation;Therapeutic activities;Functional mobility training;Stair training;Gait training;DME Instruction;Therapeutic exercise;Balance training;Neuromuscular re-education;Patient/family education;Manual techniques;Passive range of motion;Energy conservation;Taping    PT Next Visit Plan  conitnue progression of ambulation, stairs, increase leg press resistance or  consider single leg; add in dual tasking elements     PT Home Exercise Plan  see sheet    Consulted and Agree with Plan of Care  Patient       Patient will benefit from skilled therapeutic intervention in order to improve the following deficits and impairments:  Abnormal gait, Decreased activity tolerance, Decreased balance, Decreased knowledge of precautions, Decreased endurance, Decreased knowledge of use of DME, Decreased mobility, Decreased safety awareness, Difficulty walking, Decreased strength, Improper body mechanics  Visit Diagnosis: Muscle weakness (generalized)  Other abnormalities of gait and mobility  Unsteadiness on feet     Problem List Patient Active Problem List   Diagnosis Date Noted  . Hypertensive urgency 12/31/2016  . Stroke (HCEl [place]04/05/2017  . TIA (transient ischemic attack) 03/07/2016  . Acute CVA (cerebrovascular accident) ([place]CD[place]12/27/2016   MaJanna ArchPT, DPT   01/04/2018, 4:47 PM  CoKeomah VillageAIN REEye Surgery Center Of Chattano[facility] LLCERVICES [address]dHighland HeightsNC[place]hone: [phone] Fax:  [phone]: IqCuthbert TurtonRN: [id number] of Birth: 9/01-26-1927

## 2018-01-06 ENCOUNTER — Ambulatory Visit: Payer: Medicare Other

## 2018-01-11 ENCOUNTER — Ambulatory Visit: Payer: Medicare Other

## 2018-01-11 DIAGNOSIS — R2689 Other abnormalities of gait and mobility: Secondary | ICD-10-CM

## 2018-01-11 DIAGNOSIS — M6281 Muscle weakness (generalized): Secondary | ICD-10-CM

## 2018-01-11 DIAGNOSIS — R2681 Unsteadiness on feet: Secondary | ICD-10-CM

## 2018-01-11 NOTE — Therapy (Signed)
[place] MAIN [facility] SERVICES [address] [place], [place], [address] Phone: [phone]   Fax:  [phone]  Physical Therapy Treatment Physical Therapy Progress Note   Dates of reporting period  12/09/17  to   01/11/18  Patient Details  Name: Christopher [name] MRN: [id number] Date of Birth: Nov 15, 1925 Referring Provider: Dr. Chryl Heck   Encounter Date: 01/11/2018  PT End of Session - 01/11/18 1605    Visit Number  18    Number of Visits  32    Date for PT Re-Evaluation  02/03/18    Authorization Type  8/10 (next will be 1/10)     PT Start Time  1559    PT Stop Time  1644    PT Time Calculation (min)  45 min    Equipment Utilized During Treatment  Gait belt    Activity Tolerance  Patient tolerated treatment well    Behavior During Therapy  [facility] for tasks assessed/performed       Past Medical History:  Diagnosis Date  . Arthritis   . Ataxia   . Diabetes mellitus without complication ([place])    controlled, pt checks it in the morning   . Dyslipidemia   . Hypertension    somewhat controlled, pt checks every morning, he reports it has been good.  . Stroke ([place])   . TIA (transient ischemic attack)     Past Surgical History:  Procedure Laterality Date  . none      There were no vitals filed for this visit.    Subjective Assessment - 01/11/18 1603    Subjective  Pt. reports he missed last session because he made an trip to [place] with his daughter. Patient reports they drove back last night at 4 am in the morning.     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 2017, and CVA Dec 2016. Was seen at this facility by a different therapist until 03/2017. Patient reports that he was doing his exercises at home when his nurse would come. Now he reports he does not have a nurse coming to his house and does not do  his exercises.  Reports that he is not walking as fast now as he was able to before or for as long of distances. Reports no falls.     Limitations  Walking;House hold activities;Other (comment)    How long can you sit comfortably?  n/a    How long can you stand comfortably?  n/a    How long can you walk comfortably?  across his house     Diagnostic tests  MR 6/27: IMPRESSION: No acute intracranial abnormality identified. Small chronic infarcts in basal ganglia, left paramedian pons,and right frontal subcortical white matter. Stable moderate chronic microvascular ischemic changes and mild parenchymal volume loss of the brain. Single interval punctate focus of microhemorrhage and left brachium pontis. Additional scattered stable chronic foci of microhemorrhage with central predominance, likely related to chronic hypertension.    Patient Stated Goals  Increase gait speed and walk for longer    Currently in Pain?  No/denies         Nustep Lvl 4 4 minutes RPM>60    5 seconds SLS BERG: 51/56  10 MWT: 11 seconds= .91 m/s  6 MWT: 821  [facility] PT Assessment - 01/11/18 0001  Berg Balance Test   Sit to Stand  Able to stand without using hands and stabilize independently    Standing Unsupported  Able to stand safely 2 minutes    Sitting with Back Unsupported but Feet Supported on Floor or Stool  Able to sit safely and securely 2 minutes    Stand to Sit  Sits safely with minimal use of hands    Transfers  Able to transfer safely, minor use of hands    Standing Unsupported with Eyes Closed  Able to stand 10 seconds safely    Standing Ubsupported with Feet Together  Able to place feet together independently and stand 1 minute safely    From Standing, Reach Forward with Outstretched Arm  Can reach forward >12 cm safely (5")    From Standing Position, Pick up Object from Floor  Able to pick up shoe safely and easily    From Standing Position, Turn to Look Behind Over each Shoulder  Looks behind from  both sides and weight shifts well    Turn 360 Degrees  Able to turn 360 degrees safely in 4 seconds or less    Standing Unsupported, Alternately Place Feet on Step/Stool  Able to stand independently and complete 8 steps >20 seconds    Standing Unsupported, One Foot in Front  Able to plae foot ahead of the other independently and hold 30 seconds    Standing on One Leg  Able to lift leg independently and hold equal to or more than 3 seconds    Total Score  51      6" step eccentric step down SUE support 12x each leg. : buckling of R knee  With fatigue   6 " step crane step ups 12x each leg: SUE support  5lb ankle weights LAQ 10x neutral, 10x eversion, 10x inversion    Patient's condition has the potential to improve in response to therapy. Maximum improvement is yet to be obtained. The anticipated improvement is attainable and reasonable in a generally predictable time. Start date of reporting period 12/09/17 end date of reporting period 01/11/18 Patient reports he still has difficulty with scuffling his foot when walking. Has been feeling stronger though.                     PT Education - 01/11/18 1605    Education provided  Yes    Education Details  exercise technique, stabaility, goal progression    Person(s) Educated  Patient    Methods  Explanation;Demonstration;Verbal cues    Comprehension  Verbalized understanding;Returned demonstration       PT Short Term Goals - 12/09/17 1623      PT SHORT TERM GOAL #1   Title  Patient will be independent in home exercise program to improve strength/mobility for better functional independence with ADLs.    Baseline  HEP given     Time  2    Period  Weeks    Status  Partially Met    Target Date  12/23/17      PT SHORT TERM GOAL #2   Title  Patient will tolerate 5 seconds of single leg stance without loss of balance to improve ability to get in and out of shower safely.    Baseline  3 seconds    Time  2    Period  Weeks     Status  Partially Met    Target Date  12/23/17      PT SHORT TERM  GOAL #3   Title  Patient will increase six minute walk test distance to 600 ft for progression to community ambulator and improve gait ability    Baseline  441 ft; 6/5: 793    Time  2    Period  Weeks    Status  Achieved      PT SHORT TERM GOAL #4   Title  Patient will increase BLE gross strength to 4+/5 as to improve functional strength for independent gait, increased standing tolerance and increased ADL ability.    Baseline  4-/5; 6/5 4/5    Time  2    Period  Weeks    Status  Achieved        PT Long Term Goals - 01/11/18 1611      PT LONG TERM GOAL #1   Title  Patient will demonstrate an improved Berg Balance Score of >50/56 as to demonstrate improved balance with ADLs such as sitting/standing and transfer balance and reduced fall risk    Baseline  42/56; 6/5: 47/56 7/8: 51/56 seconds    Time  8    Period  Weeks    Status  Achieved      PT LONG TERM GOAL #2   Title  Patient will increase 10 meter walk test to >1.56ms as to improve gait speed for better community ambulation and to reduce fall risk    Baseline  .28m with quad cane; 65: .83 m/s with no AD; 7/8: .91 m/s without AD     Time  8    Period  Weeks    Status  Partially Met    Target Date  02/03/18      PT LONG TERM GOAL #3   Title  Patient will be modified independent in walking on even/uneven surface with least restrictive assistive device, for 20+ minutes without rest break, reporting some difficulty or less to improve walking tolerance with community ambulation including grocery shopping, going to church,etc.     Baseline  4/23: unable to walk that distance; 6/5: challenged with 6 minutes on stable surface    Time  8    Period  Weeks    Status  Partially Met    Target Date  02/03/18      PT LONG TERM GOAL #4   Title  Patient will increase six minute walk test distance to >800 for progression to community ambulator and improve gait ability     Baseline  4/23: 441 ft 6/5: 793 with 3 episodes of LOB and frequent shuffling of LEs. ; 7/8: 821 ft    Time  8    Period  Weeks    Status  Achieved      PT LONG TERM GOAL #5   Title  Patient will perform SLS >5 seconds bilaterally to increase stability when getting in/out of shower    Baseline  7/8: 3 seconds    Time  8    Period  Weeks    Status  New    Target Date  02/03/18      PT LONG TERM GOAL #6   Title  Patient will increase six minute walk test distance to >1000 for progression to community ambulator and improve gait ability    Baseline  7/8: 821 ft    Time  8    Period  Weeks    Status  New    Target Date  02/03/18            Plan -  01/11/18 1744    Clinical Impression Statement  Patient demonstrates progress towards goals at this time with improved stability and ambulatory speed/duration. 6 min walk test increased to 821 ft with 10 MWT improved to .91 m/s.BERG 51/56, SLS continues to be challenged limiting patient's ability to perform more advanced dynamic tasks with buckling of R knee. Increased shuffle with prolonged ambulation noted due to poor heel rocker.  Patient's condition has the potential to improve in response to therapy. Maximum improvement is yet to be obtained. The anticipated improvement is attainable and reasonable in a generally predictable time.  Patient will continue to benefit from skilled physical therapy to improve gait mechanics, strength, and balance for return to PLOF    Rehab Potential  Fair    Clinical Impairments Affecting Rehab Potential  (+) motivation, family support  (-) age, difficulty hearing/remembering    PT Frequency  2x / week    PT Duration  8 weeks    PT Treatment/Interventions  ADLs/Self Care Home Management;Electrical Stimulation;Therapeutic activities;Functional mobility training;Stair training;Gait training;DME Instruction;Therapeutic exercise;Balance training;Neuromuscular re-education;Patient/family education;Manual  techniques;Passive range of motion;Energy conservation;Taping    PT Next Visit Plan  conitnue progression of ambulation, stairs, increase leg press resistance or consider single leg; add in dual tasking elements     PT Home Exercise Plan  see sheet    Consulted and Agree with Plan of Care  Patient       Patient will benefit from skilled therapeutic intervention in order to improve the following deficits and impairments:  Abnormal gait, Decreased activity tolerance, Decreased balance, Decreased knowledge of precautions, Decreased endurance, Decreased knowledge of use of DME, Decreased mobility, Decreased safety awareness, Difficulty walking, Decreased strength, Improper body mechanics  Visit Diagnosis: Muscle weakness (generalized)  Other abnormalities of gait and mobility  Unsteadiness on feet     Problem List Patient Active Problem List   Diagnosis Date Noted  . Hypertensive urgency 12/31/2016  . Stroke ([place]) 10/15/2016  . TIA (transient ischemic attack) 03/07/2016  . Acute CVA (cerebrovascular accident) ([place]) 07/03/2015   Janna Arch, PT, DPT   01/11/2018, 5:45 PM  [place] MAIN [facility] SERVICES [address] [place], [place], [address] Phone: [phone]   Fax:  [phone]  Name: Christopher [name] MRN: [id number] Date of Birth: 1926-02-24

## 2018-01-13 ENCOUNTER — Ambulatory Visit: Payer: Medicare Other

## 2018-01-13 DIAGNOSIS — M6281 Muscle weakness (generalized): Secondary | ICD-10-CM | POA: Diagnosis not present

## 2018-01-13 DIAGNOSIS — R2681 Unsteadiness on feet: Secondary | ICD-10-CM

## 2018-01-13 DIAGNOSIS — R2689 Other abnormalities of gait and mobility: Secondary | ICD-10-CM

## 2018-01-13 NOTE — Therapy (Signed)
[place] MAIN [facility] SERVICES [address] [place], [place], [address] Phone: [phone]   Fax:  [phone]  Physical Therapy Treatment  Patient Details  Name: Christopher [name] MRN: [id number] Date of Birth: 1925/11/04 Referring Provider: Dr. Chryl Heck   Encounter Date: 01/13/2018  PT End of Session - 01/13/18 1604    Visit Number  19    Number of Visits  32    Date for PT Re-Evaluation  02/03/18    Authorization Type  1/10    PT Start Time  1559    PT Stop Time  1644    PT Time Calculation (min)  45 min    Equipment Utilized During Treatment  Gait belt    Activity Tolerance  Patient tolerated treatment well    Behavior During Therapy  [facility] for tasks assessed/performed       Past Medical History:  Diagnosis Date  . Arthritis   . Ataxia   . Diabetes mellitus without complication ([place])    controlled, pt checks it in the morning   . Dyslipidemia   . Hypertension    somewhat controlled, pt checks every morning, he reports it has been good.  . Stroke ([place])   . TIA (transient ischemic attack)     Past Surgical History:  Procedure Laterality Date  . none      There were no vitals filed for this visit.  Subjective Assessment - 01/13/18 1602    Subjective  Patient reports having a good day. Having some difficulty understanding English today. Has been compliant with HEP.     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 2017, and CVA Dec 2016. Was seen at this facility by a different therapist until 03/2017. Patient reports that he was doing his exercises at home when his nurse would come. Now he reports he does not have a nurse coming to his house and does not do his exercises.  Reports that he is not walking as fast now as he was able to before or for as long of distances. Reports no falls.     Limitations   Walking;House hold activities;Other (comment)    How long can you sit comfortably?  n/a    How long can you stand comfortably?  n/a    How long can you walk comfortably?  across his house     Diagnostic tests  MR 6/27: IMPRESSION: No acute intracranial abnormality identified. Small chronic infarcts in basal ganglia, left paramedian pons,and right frontal subcortical white matter. Stable moderate chronic microvascular ischemic changes and mild parenchymal volume loss of the brain. Single interval punctate focus of microhemorrhage and left brachium pontis. Additional scattered stable chronic foci of microhemorrhage with central predominance, likely related to chronic hypertension.    Patient Stated Goals  Increase gait speed and walk for longer    Currently in Pain?  No/denies       Neuro Re-ed  airex pad: eyes closed 4x30 seconds ; Narrow BOS challenging with frequent posterior and left trunk lean    Step over and back orange hurdle no UE support 10x eaach leg     6" step eccentric step down SUE support 12x each leg. : buckling of R knee  With fatigue   6 " step crane step ups 12x each leg: SUE support  TherEx      Single leg leg press  #75 2x10; cues for allowing slight knee flexion at end to prevent hyperextension  Bilateral leg press #150 2x10 ; fatigue with last 3 reps of each set requiring cueing for improved eccentric control .   GTB monster walks in // bars 8x length of bars  Ambulate backwards in // bars x4 trials   10 hover squats in // bars no UE support, CGA cues for keeping chest up via tactile cueing   Weighted ball (2000 Gr) straight arm raises 10x in // bars  Ambulate 96 ft without AD with CGA cues for hip flexion to decrease foot drag.    Pt. response to medical necessity: Patient will continue to benefit from skilled physical therapy to improve gait mechanics, strength, and balance for return to PLOF                          PT  Education - 01/13/18 1603    Education provided  Yes    Education Details  exercise technique, stability,     Person(s) Educated  Patient    Methods  Explanation;Demonstration;Verbal cues    Comprehension  Verbalized understanding;Returned demonstration       PT Short Term Goals - 12/09/17 1623      PT SHORT TERM GOAL #1   Title  Patient will be independent in home exercise program to improve strength/mobility for better functional independence with ADLs.    Baseline  HEP given     Time  2    Period  Weeks    Status  Partially Met    Target Date  12/23/17      PT SHORT TERM GOAL #2   Title  Patient will tolerate 5 seconds of single leg stance without loss of balance to improve ability to get in and out of shower safely.    Baseline  3 seconds    Time  2    Period  Weeks    Status  Partially Met    Target Date  12/23/17      PT SHORT TERM GOAL #3   Title  Patient will increase six minute walk test distance to 600 ft for progression to community ambulator and improve gait ability    Baseline  441 ft; 6/5: 793    Time  2    Period  Weeks    Status  Achieved      PT SHORT TERM GOAL #4   Title  Patient will increase BLE gross strength to 4+/5 as to improve functional strength for independent gait, increased standing tolerance and increased ADL ability.    Baseline  4-/5; 6/5 4/5    Time  2    Period  Weeks    Status  Achieved        PT Long Term Goals - 01/11/18 1611      PT LONG TERM GOAL #1   Title  Patient will demonstrate an improved Berg Balance Score of >50/56 as to demonstrate improved balance with ADLs such as sitting/standing and transfer balance and reduced fall risk    Baseline  42/56; 6/5: 47/56 7/8: 51/56 seconds    Time  8    Period  Weeks    Status  Achieved      PT LONG TERM GOAL #2   Title  Patient will increase 10 meter walk test to >1.97ms as to improve gait speed for better  community ambulation and to reduce fall risk    Baseline  .67ms with quad  cane; 65: .83 m/s with no AD; 7/8: .91 m/s without AD     Time  8    Period  Weeks    Status  Partially Met    Target Date  02/03/18      PT LONG TERM GOAL #3   Title  Patient will be modified independent in walking on even/uneven surface with least restrictive assistive device, for 20+ minutes without rest break, reporting some difficulty or less to improve walking tolerance with community ambulation including grocery shopping, going to church,etc.     Baseline  4/23: unable to walk that distance; 6/5: challenged with 6 minutes on stable surface    Time  8    Period  Weeks    Status  Partially Met    Target Date  02/03/18      PT LONG TERM GOAL #4   Title  Patient will increase six minute walk test distance to >800 for progression to community ambulator and improve gait ability    Baseline  4/23: 441 ft 6/5: 793 with 3 episodes of LOB and frequent shuffling of LEs. ; 7/8: 821 ft    Time  8    Period  Weeks    Status  Achieved      PT LONG TERM GOAL #5   Title  Patient will perform SLS >5 seconds bilaterally to increase stability when getting in/out of shower    Baseline  7/8: 3 seconds    Time  8    Period  Weeks    Status  New    Target Date  02/03/18      PT LONG TERM GOAL #6   Title  Patient will increase six minute walk test distance to >1000 for progression to community ambulator and improve gait ability    Baseline  7/8: 821 ft    Time  8    Period  Weeks    Status  New    Target Date  02/03/18            Plan - 01/13/18 1630    Clinical Impression Statement  Patient demonstrates intermittent carryover from previous session ambulation interventions with improved heel strike and decreased foot drag with cueing. Improved contraction of muscles noted with close chained interventions resulting in smoother coordination. Patient will continue to benefit from skilled physical therapy to improve gait mechanics, strength, and balance for return to PLOF    Rehab Potential   Fair    Clinical Impairments Affecting Rehab Potential  (+) motivation, family support  (-) age, difficulty hearing/remembering    PT Frequency  2x / week    PT Duration  8 weeks    PT Treatment/Interventions  ADLs/Self Care Home Management;Electrical Stimulation;Therapeutic activities;Functional mobility training;Stair training;Gait training;DME Instruction;Therapeutic exercise;Balance training;Neuromuscular re-education;Patient/family education;Manual techniques;Passive range of motion;Energy conservation;Taping    PT Next Visit Plan  conitnue progression of ambulation, stairs, increase leg press resistance or consider single leg; add in dual tasking elements     PT Home Exercise Plan  see sheet    Consulted and Agree with Plan of Care  Patient       Patient will benefit from skilled therapeutic intervention in order to improve the following deficits and impairments:  Abnormal gait, Decreased activity tolerance, Decreased balance, Decreased knowledge of precautions, Decreased endurance, Decreased knowledge of use of DME, Decreased mobility, Decreased safety awareness, Difficulty walking, Decreased strength,  Improper body mechanics  Visit Diagnosis: Muscle weakness (generalized)  Other abnormalities of gait and mobility  Unsteadiness on feet     Problem List Patient Active Problem List   Diagnosis Date Noted  . Hypertensive urgency 12/31/2016  . Stroke ([place]) 10/15/2016  . TIA (transient ischemic attack) 03/07/2016  . Acute CVA (cerebrovascular accident) ([place]) 07/03/2015   Janna Arch, PT, DPT   01/13/2018, 4:49 PM  [place] MAIN [facility] SERVICES [address] [place], [place], [address] Phone: [phone]   Fax:  [phone]  Name: Christopher [name] MRN: [id number] Date of Birth: 03/18/1926

## 2018-01-18 ENCOUNTER — Ambulatory Visit: Payer: Medicare Other

## 2018-01-18 DIAGNOSIS — M6281 Muscle weakness (generalized): Secondary | ICD-10-CM

## 2018-01-18 DIAGNOSIS — R2681 Unsteadiness on feet: Secondary | ICD-10-CM

## 2018-01-18 DIAGNOSIS — R2689 Other abnormalities of gait and mobility: Secondary | ICD-10-CM

## 2018-01-18 NOTE — Therapy (Signed)
[place] MAIN [facility] SERVICES [address] [place], [place], [address] Phone: [phone]   Fax:  [phone]  Physical Therapy Treatment  Patient Details  Name: Christopher [name] MRN: [id number] Date of Birth: 09-16-1925 Referring Provider: Dr. Chryl Heck   Encounter Date: 01/18/2018  PT End of Session - 01/18/18 1607    Visit Number  20    Number of Visits  32    Date for PT Re-Evaluation  02/03/18    Authorization Type  2/10    PT Start Time  1600    PT Stop Time  1646    PT Time Calculation (min)  46 min    Equipment Utilized During Treatment  Gait belt    Activity Tolerance  Patient tolerated treatment well    Behavior During Therapy  [facility] for tasks assessed/performed       Past Medical History:  Diagnosis Date  . Arthritis   . Ataxia   . Diabetes mellitus without complication ([place])    controlled, pt checks it in the morning   . Dyslipidemia   . Hypertension    somewhat controlled, pt checks every morning, he reports it has been good.  . Stroke ([place])   . TIA (transient ischemic attack)     Past Surgical History:  Procedure Laterality Date  . none      There were no vitals filed for this visit.  Subjective Assessment - 01/18/18 1605    Subjective  Patient will be going to [place] after session today to see his grandson who is in the hospital. Reports no pain of falls since last session.     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 2017, and CVA Dec 2016. Was seen at this facility by a different therapist until 03/2017. Patient reports that he was doing his exercises at home when his nurse would come. Now he reports he does not have a nurse coming to his house and does not do his exercises.  Reports that he is not walking as fast now as he was able to before or for as long of distances. Reports no  falls.     Limitations  Walking;House hold activities;Other (comment)    How long can you sit comfortably?  n/a    How long can you stand comfortably?  n/a    How long can you walk comfortably?  across his house     Diagnostic tests  MR 6/27: IMPRESSION: No acute intracranial abnormality identified. Small chronic infarcts in basal ganglia, left paramedian pons,and right frontal subcortical white matter. Stable moderate chronic microvascular ischemic changes and mild parenchymal volume loss of the brain. Single interval punctate focus of microhemorrhage and left brachium pontis. Additional scattered stable chronic foci of microhemorrhage with central predominance, likely related to chronic hypertension.    Patient Stated Goals  Increase gait speed and walk for longer    Currently in Pain?  No/denies       Nustep Lvl 5 4 minutes RPM>60 no charge   Balloon taps in // bars 4 minutes  Airex pad: eyes closed 3x30 seconds   Ambulate in hallway with horizontal head turns and max verbal cueing for heel strike, loses heel strike with secondary task of head turn. 200 ft with CGA no AD .  6" step eccentric step down SUE support  15x each leg. : buckling of R knee  With fatigue        Single leg leg press  #75 2x10; cues for allowing slight knee flexion at end to prevent hyperextension   Bilateral leg press #150 2x10 ; fatigue with last 3 reps of each set requiring cueing for improved eccentric control .    GTB monster walks in // bars 8x length of bars   Orange hurdle step up and over 15x each leg.   Ambulate 96 ft without AD with CGA cues for hip flexion to decrease foot drag.    Pt. response to medical necessity: Patient will continue to benefit from skilled physical therapy to improve gait mechanics, strength, and balance for return to PLOF                         PT Education - 01/18/18 1606    Education provided  Yes    Education Details  exercise technique,  stability     Person(s) Educated  Patient    Methods  Explanation;Demonstration;Verbal cues    Comprehension  Verbalized understanding;Returned demonstration       PT Short Term Goals - 12/09/17 1623      PT SHORT TERM GOAL #1   Title  Patient will be independent in home exercise program to improve strength/mobility for better functional independence with ADLs.    Baseline  HEP given     Time  2    Period  Weeks    Status  Partially Met    Target Date  12/23/17      PT SHORT TERM GOAL #2   Title  Patient will tolerate 5 seconds of single leg stance without loss of balance to improve ability to get in and out of shower safely.    Baseline  3 seconds    Time  2    Period  Weeks    Status  Partially Met    Target Date  12/23/17      PT SHORT TERM GOAL #3   Title  Patient will increase six minute walk test distance to 600 ft for progression to community ambulator and improve gait ability    Baseline  441 ft; 6/5: 793    Time  2    Period  Weeks    Status  Achieved      PT SHORT TERM GOAL #4   Title  Patient will increase BLE gross strength to 4+/5 as to improve functional strength for independent gait, increased standing tolerance and increased ADL ability.    Baseline  4-/5; 6/5 4/5    Time  2    Period  Weeks    Status  Achieved        PT Long Term Goals - 01/11/18 1611      PT LONG TERM GOAL #1   Title  Patient will demonstrate an improved Berg Balance Score of >50/56 as to demonstrate improved balance with ADLs such as sitting/standing and transfer balance and reduced fall risk    Baseline  42/56; 6/5: 47/56 7/8: 51/56 seconds    Time  8    Period  Weeks    Status  Achieved      PT LONG TERM GOAL #2   Title  Patient will increase 10 meter walk test to >1.67ms as to improve gait speed for better community ambulation and to reduce fall risk    Baseline  .226m with quad cane;  65: .83 m/s with no AD; 7/8: .91 m/s without AD     Time  8    Period  Weeks    Status   Partially Met    Target Date  02/03/18      PT LONG TERM GOAL #3   Title  Patient will be modified independent in walking on even/uneven surface with least restrictive assistive device, for 20+ minutes without rest break, reporting some difficulty or less to improve walking tolerance with community ambulation including grocery shopping, going to church,etc.     Baseline  4/23: unable to walk that distance; 6/5: challenged with 6 minutes on stable surface    Time  8    Period  Weeks    Status  Partially Met    Target Date  02/03/18      PT LONG TERM GOAL #4   Title  Patient will increase six minute walk test distance to >800 for progression to community ambulator and improve gait ability    Baseline  4/23: 441 ft 6/5: 793 with 3 episodes of LOB and frequent shuffling of LEs. ; 7/8: 821 ft    Time  8    Period  Weeks    Status  Achieved      PT LONG TERM GOAL #5   Title  Patient will perform SLS >5 seconds bilaterally to increase stability when getting in/out of shower    Baseline  7/8: 3 seconds    Time  8    Period  Weeks    Status  New    Target Date  02/03/18      PT LONG TERM GOAL #6   Title  Patient will increase six minute walk test distance to >1000 for progression to community ambulator and improve gait ability    Baseline  7/8: 821 ft    Time  8    Period  Weeks    Status  New    Target Date  02/03/18            Plan - 01/18/18 1746    Clinical Impression Statement  Patient challenged by dorisflexion of R foot and eccentric control of RLE resulting in decreased gait mechanics. Patient has intermittent carryover from previous session however loses heel strike upon addition of secondary task. Patient will continue to benefit from skilled physical therapy to improve gait mechanics, strength, and balance for return to PLOF    Rehab Potential  Fair    Clinical Impairments Affecting Rehab Potential  (+) motivation, family support  (-) age, difficulty hearing/remembering     PT Frequency  2x / week    PT Duration  8 weeks    PT Treatment/Interventions  ADLs/Self Care Home Management;Electrical Stimulation;Therapeutic activities;Functional mobility training;Stair training;Gait training;DME Instruction;Therapeutic exercise;Balance training;Neuromuscular re-education;Patient/family education;Manual techniques;Passive range of motion;Energy conservation;Taping    PT Next Visit Plan  conitnue progression of ambulation, stairs, increase leg press resistance or consider single leg; add in dual tasking elements     PT Home Exercise Plan  see sheet    Consulted and Agree with Plan of Care  Patient       Patient will benefit from skilled therapeutic intervention in order to improve the following deficits and impairments:  Abnormal gait, Decreased activity tolerance, Decreased balance, Decreased knowledge of precautions, Decreased endurance, Decreased knowledge of use of DME, Decreased mobility, Decreased safety awareness, Difficulty walking, Decreased strength, Improper body mechanics  Visit Diagnosis: Muscle weakness (generalized)  Other abnormalities of gait and  mobility  Unsteadiness on feet     Problem List Patient Active Problem List   Diagnosis Date Noted  . Hypertensive urgency 12/31/2016  . Stroke ([place]) 10/15/2016  . TIA (transient ischemic attack) 03/07/2016  . Acute CVA (cerebrovascular accident) ([place]) 07/03/2015   Janna Arch, PT, DPT   01/18/2018, 5:46 PM  [place] MAIN [facility] SERVICES [address] [place], [place], [address] Phone: [phone]   Fax:  [phone]  Name: Christopher [name] MRN: [id number] Date of Birth: 1925-11-15

## 2018-01-20 ENCOUNTER — Ambulatory Visit: Payer: Medicare Other

## 2018-01-20 DIAGNOSIS — R2689 Other abnormalities of gait and mobility: Secondary | ICD-10-CM

## 2018-01-20 DIAGNOSIS — M6281 Muscle weakness (generalized): Secondary | ICD-10-CM | POA: Diagnosis not present

## 2018-01-20 DIAGNOSIS — R2681 Unsteadiness on feet: Secondary | ICD-10-CM

## 2018-01-20 NOTE — Therapy (Addendum)
[place] MAIN [facility] SERVICES [address] [place], [place], [address] Phone: [phone]   Fax:  [phone]  Physical Therapy Treatment  Patient Details  Name: Christopher [name] MRN: [id number] Date of Birth: 1925/08/20 Referring Provider: Dr. Chryl Heck   Encounter Date: 01/20/2018  PT End of Session - 01/20/18 1614    Visit Number  21    Number of Visits  32    Date for PT Re-Evaluation  02/03/18    Authorization Type  3/10    PT Start Time  1610    PT Stop Time  1645    PT Time Calculation (min)  35 min    Equipment Utilized During Treatment  Gait belt    Activity Tolerance  Patient tolerated treatment well    Behavior During Therapy  [facility] for tasks assessed/performed       Past Medical History:  Diagnosis Date  . Arthritis   . Ataxia   . Diabetes mellitus without complication ([place])    controlled, pt checks it in the morning   . Dyslipidemia   . Hypertension    somewhat controlled, pt checks every morning, he reports it has been good.  . Stroke ([place])   . TIA (transient ischemic attack)     Past Surgical History:  Procedure Laterality Date  . none      There were no vitals filed for this visit.  Subjective Assessment - 01/20/18 1613    Subjective  Patient went to [facility] to see grandson after his surgery since last session. Reports no falls or LOB since last session .    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 2017, and CVA Dec 2016. Was seen at this facility by a different therapist until 03/2017. Patient reports that he was doing his exercises at home when his nurse would come. Now he reports he does not have a nurse coming to his house and does not do his exercises.  Reports that he is not walking as fast now as he was able to before or for as long of distances. Reports no falls.     Limitations  Walking;House hold activities;Other (comment)    How long can you sit comfortably?  n/a    How long can you stand comfortably?  n/a    How long can you walk comfortably?  across his house     Diagnostic tests  MR 6/27: IMPRESSION: No acute intracranial abnormality identified. Small chronic infarcts in basal ganglia, left paramedian pons,and right frontal subcortical white matter. Stable moderate chronic microvascular ischemic changes and mild parenchymal volume loss of the brain. Single interval punctate focus of microhemorrhage and left brachium pontis. Additional scattered stable chronic foci of microhemorrhage with central predominance, likely related to chronic hypertension.    Patient Stated Goals  Increase gait speed and walk for longer    Currently in Pain?  No/denies      Nustep Lvl 5 4 minutes RPM>60 no charge     Stand on half foam roller without UE support 3x30 seconds; patient challenged with stability   Bosu ball balance 3x60 seconds frequent trunk lean to R side due to decreased push into ball with RLE   Balloon taps in // bars 4 minutes w patient on airex pad     Ambulate in hallway with horizontal head  turns and max verbal cueing for heel strike, loses heel strike occasionally with secondary task of head turn. 200 ft with CGA no AD .   6" step eccentric step down no UE support 15x each leg. : buckling of R knee  With fatigue    5lb ankle weights :   Seated LAQ 15x 3 second holds , BLE cues for upright posture    Standing marches 15x each leg in //bars   Standing heel raises 15x    Pt. response to medical necessity: Patient will continue to benefit from skilled physical therapy to improve gait mechanics, strength, and balance for return to PLOF                          PT Education - 01/20/18 1614    Education provided  Yes    Education Details  exercise technique, stability     Person(s) Educated  Patient    Methods   Explanation;Demonstration;Verbal cues    Comprehension  Verbalized understanding;Returned demonstration       PT Short Term Goals - 12/09/17 1623      PT SHORT TERM GOAL #1   Title  Patient will be independent in home exercise program to improve strength/mobility for better functional independence with ADLs.    Baseline  HEP given     Time  2    Period  Weeks    Status  Partially Met    Target Date  12/23/17      PT SHORT TERM GOAL #2   Title  Patient will tolerate 5 seconds of single leg stance without loss of balance to improve ability to get in and out of shower safely.    Baseline  3 seconds    Time  2    Period  Weeks    Status  Partially Met    Target Date  12/23/17      PT SHORT TERM GOAL #3   Title  Patient will increase six minute walk test distance to 600 ft for progression to community ambulator and improve gait ability    Baseline  441 ft; 6/5: 793    Time  2    Period  Weeks    Status  Achieved      PT SHORT TERM GOAL #4   Title  Patient will increase BLE gross strength to 4+/5 as to improve functional strength for independent gait, increased standing tolerance and increased ADL ability.    Baseline  4-/5; 6/5 4/5    Time  2    Period  Weeks    Status  Achieved        PT Long Term Goals - 01/11/18 1611      PT LONG TERM GOAL #1   Title  Patient will demonstrate an improved Berg Balance Score of >50/56 as to demonstrate improved balance with ADLs such as sitting/standing and transfer balance and reduced fall risk    Baseline  42/56; 6/5: 47/56 7/8: 51/56 seconds    Time  8    Period  Weeks    Status  Achieved      PT LONG TERM GOAL #2   Title  Patient will increase 10 meter walk test to >1.41ms as to improve gait speed for better community ambulation and to reduce fall risk    Baseline  .28m with quad cane; 65: .83 m/s with no AD; 7/8: .91 m/s without AD     Time  8    Period  Weeks    Status  Partially Met    Target Date  02/03/18      PT LONG  TERM GOAL #3   Title  Patient will be modified independent in walking on even/uneven surface with least restrictive assistive device, for 20+ minutes without rest break, reporting some difficulty or less to improve walking tolerance with community ambulation including grocery shopping, going to [place].     Baseline  4/23: unable to walk that distance; 6/5: challenged with 6 minutes on stable surface    Time  8    Period  Weeks    Status  Partially Met    Target Date  02/03/18      PT LONG TERM GOAL #4   Title  Patient will increase six minute walk test distance to >800 for progression to community ambulator and improve gait ability    Baseline  4/23: 441 ft 6/5: 793 with 3 episodes of LOB and frequent shuffling of LEs. ; 7/8: 821 ft    Time  8    Period  Weeks    Status  Achieved      PT LONG TERM GOAL #5   Title  Patient will perform SLS >5 seconds bilaterally to increase stability when getting in/out of shower    Baseline  7/8: 3 seconds    Time  8    Period  Weeks    Status  New    Target Date  02/03/18      PT LONG TERM GOAL #6   Title  Patient will increase six minute walk test distance to >1000 for progression to community ambulator and improve gait ability    Baseline  7/8: 821 ft    Time  8    Period  Weeks    Status  New    Target Date  02/03/18            Plan - 01/20/18 1639    Clinical Impression Statement  Patient late to session limiting duration of interventions. Patient demonstrated improved heel strike with ambulation with decreased need for cueing throughout session. Increased stability in single limb stance noted throughout session with decreased need for UE support. Patient will continue to benefit from skilled physical therapy to improve gait mechanics, strength, and balance for return to PLOF    Rehab Potential  Fair    Clinical Impairments Affecting Rehab Potential  (+) motivation, family support  (-) age, difficulty hearing/remembering    PT  Frequency  2x / week    PT Duration  8 weeks    PT Treatment/Interventions  ADLs/Self Care Home Management;Electrical Stimulation;Therapeutic activities;Functional mobility training;Stair training;Gait training;DME Instruction;Therapeutic exercise;Balance training;Neuromuscular re-education;Patient/family education;Manual techniques;Passive range of motion;Energy conservation;Taping    PT Next Visit Plan  conitnue progression of ambulation, stairs, increase leg press resistance or consider single leg; add in dual tasking elements     PT Home Exercise Plan  see sheet    Consulted and Agree with Plan of Care  Patient       Patient will benefit from skilled therapeutic intervention in order to improve the following deficits and impairments:  Abnormal gait, Decreased activity tolerance, Decreased balance, Decreased knowledge of precautions, Decreased endurance, Decreased knowledge of use of DME, Decreased mobility, Decreased safety awareness, Difficulty walking, Decreased strength, Improper body mechanics  Visit Diagnosis: Muscle weakness (generalized)  Other abnormalities of gait and mobility  Unsteadiness on feet     Problem List Patient Active  Problem List   Diagnosis Date Noted  . Hypertensive urgency 12/31/2016  . Stroke ([place]) 10/15/2016  . TIA (transient ischemic attack) 03/07/2016  . Acute CVA (cerebrovascular accident) ([place]) 07/03/2015   Janna Arch, PT, DPT   01/20/2018, 4:47 PM  [place] MAIN [facility] SERVICES [address] [place], [place], [address] Phone: [phone]   Fax:  [phone]  Name: Christopher [name] MRN: [id number] Date of Birth: [birth year]

## 2018-01-25 ENCOUNTER — Ambulatory Visit: Payer: Medicare Other | Admitting: Physical Therapy

## 2018-01-26 ENCOUNTER — Ambulatory Visit: Payer: Medicare Other

## 2018-01-26 DIAGNOSIS — R2689 Other abnormalities of gait and mobility: Secondary | ICD-10-CM

## 2018-01-26 DIAGNOSIS — M6281 Muscle weakness (generalized): Secondary | ICD-10-CM | POA: Diagnosis not present

## 2018-01-26 DIAGNOSIS — R2681 Unsteadiness on feet: Secondary | ICD-10-CM

## 2018-01-26 NOTE — Therapy (Signed)
[place] MAIN [facility] SERVICES [address] [place], [place], [address] Phone: [phone]   Fax:  [phone]  Physical Therapy Treatment  Patient Details  Name: Christopher [name] MRN: [id number] Date of Birth: [birth year] Referring Provider: Dr. Chryl Heck   Encounter Date: 01/26/2018  PT End of Session - 01/26/18 1603    Visit Number  22    Number of Visits  32    Date for PT Re-Evaluation  02/03/18    Authorization Type  4/10    PT Start Time  1559    PT Stop Time  1644    PT Time Calculation (min)  45 min    Equipment Utilized During Treatment  Gait belt    Activity Tolerance  Patient tolerated treatment well    Behavior During Therapy  [facility] for tasks assessed/performed       Past Medical History:  Diagnosis Date  . Arthritis   . Ataxia   . Diabetes mellitus without complication ([place])    controlled, pt checks it in the morning   . Dyslipidemia   . Hypertension    somewhat controlled, pt checks every morning, he reports it has been good.  . Stroke ([place])   . TIA (transient ischemic attack)     Past Surgical History:  Procedure Laterality Date  . none      There were no vitals filed for this visit.  Subjective Assessment - 01/26/18 1603    Subjective  Patient reports going to the doctor since last session but is confused when attempting to explain what happened. No falls or pain reported.     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 2017, and CVA Dec 2016. Was seen at this facility by a different therapist until 03/2017. Patient reports that he was doing his exercises at home when his nurse would come. Now he reports he does not have a nurse coming to his house and does not do his exercises.  Reports that he is not walking as fast now as he was able to before or for as long of distances. Reports no  falls.     Limitations  Walking;House hold activities;Other (comment)    How long can you sit comfortably?  n/a    How long can you stand comfortably?  n/a    How long can you walk comfortably?  across his house     Diagnostic tests  MR 6/27: IMPRESSION: No acute intracranial abnormality identified. Small chronic infarcts in basal ganglia, left paramedian pons,and right frontal subcortical white matter. Stable moderate chronic microvascular ischemic changes and mild parenchymal volume loss of the brain. Single interval punctate focus of microhemorrhage and left brachium pontis. Additional scattered stable chronic foci of microhemorrhage with central predominance, likely related to chronic hypertension.    Patient Stated Goals  Increase gait speed and walk for longer    Currently in Pain?  No/denies        Nustep Lvl 5 4 minutes RPM>60 no charge     Single leg leg press #[id number]; cues for allowing slight knee flexion at end to prevent hyperextension  Bilateral leg press #150 2x10 ; fatigue with last 3 reps of each set requiring cueing for improved eccentric control .     Stand on half foam roller without UE support 2x 60 seconds; patient  challenged with stability    Bosu ball balance 3x60 seconds frequent trunk lean to R side due to decreased push into ball with RLE    Balloon taps in // bars 4 minutes w patient on airex pad   Ambulate in hallway cues for heel strike and step length. 200 ft with CGA no AD .   6" step eccentric step down SUE support 15x each leg. : buckling of R knee  With fatigue    5lb ankle weights :              Seated LAQ 15x 3 second holds , BLE cues for upright posture          Seated marches 15x each leg   Pt. response to medical necessity: Patient will continue to benefit from skilled physical therapy to improve gait mechanics, strength, and balance for return to PLOF                         PT Education - 01/26/18 1603     Education provided  Yes    Education Details  exercise technique, stability     Person(s) Educated  Patient    Methods  Explanation;Demonstration;Verbal cues    Comprehension  Verbalized understanding;Returned demonstration       PT Short Term Goals - 12/09/17 1623      PT SHORT TERM GOAL #1   Title  Patient will be independent in home exercise program to improve strength/mobility for better functional independence with ADLs.    Baseline  HEP given     Time  2    Period  Weeks    Status  Partially Met    Target Date  12/23/17      PT SHORT TERM GOAL #2   Title  Patient will tolerate 5 seconds of single leg stance without loss of balance to improve ability to get in and out of shower safely.    Baseline  3 seconds    Time  2    Period  Weeks    Status  Partially Met    Target Date  12/23/17      PT SHORT TERM GOAL #3   Title  Patient will increase six minute walk test distance to 600 ft for progression to community ambulator and improve gait ability    Baseline  441 ft; 6/5: 793    Time  2    Period  Weeks    Status  Achieved      PT SHORT TERM GOAL #4   Title  Patient will increase BLE gross strength to 4+/5 as to improve functional strength for independent gait, increased standing tolerance and increased ADL ability.    Baseline  4-/5; 6/5 4/5    Time  2    Period  Weeks    Status  Achieved        PT Long Term Goals - 01/11/18 1611      PT LONG TERM GOAL #1   Title  Patient will demonstrate an improved Berg Balance Score of >50/56 as to demonstrate improved balance with ADLs such as sitting/standing and transfer balance and reduced fall risk    Baseline  42/56; 6/5: 47/56 7/8: 51/56 seconds    Time  8    Period  Weeks    Status  Achieved      PT LONG TERM GOAL #2   Title  Patient will increase 10 meter walk test to >  1.72ms as to improve gait speed for better community ambulation and to reduce fall risk    Baseline  .278m with quad cane; 65: .83 m/s with no AD;  7/8: .91 m/s without AD     Time  8    Period  Weeks    Status  Partially Met    Target Date  02/03/18      PT LONG TERM GOAL #3   Title  Patient will be modified independent in walking on even/uneven surface with least restrictive assistive device, for 20+ minutes without rest break, reporting some difficulty or less to improve walking tolerance with community ambulation including grocery shopping, going to church,etc.     Baseline  4/23: unable to walk that distance; 6/5: challenged with 6 minutes on stable surface    Time  8    Period  Weeks    Status  Partially Met    Target Date  02/03/18      PT LONG TERM GOAL #4   Title  Patient will increase six minute walk test distance to >800 for progression to community ambulator and improve gait ability    Baseline  4/23: 441 ft 6/5: 793 with 3 episodes of LOB and frequent shuffling of LEs. ; 7/8: 821 ft    Time  8    Period  Weeks    Status  Achieved      PT LONG TERM GOAL #5   Title  Patient will perform SLS >5 seconds bilaterally to increase stability when getting in/out of shower    Baseline  7/8: 3 seconds    Time  8    Period  Weeks    Status  New    Target Date  02/03/18      PT LONG TERM GOAL #6   Title  Patient will increase six minute walk test distance to >1000 for progression to community ambulator and improve gait ability    Baseline  7/8: 821 ft    Time  8    Period  Weeks    Status  New    Target Date  02/03/18            Plan - 01/26/18 1611    Clinical Impression Statement  Patient demonstrating increased coordination of muscle activation with smoother movements during strengthening interventions in close chained. Decreased fatigue at end of set implicates increased strength additionally. Patient continues to be challenged by dynamic surfaces and single limb stance.Patient will continue to benefit from skilled physical therapy to improve gait mechanics, strength, and balance for return to PLOF      Rehab  Potential  Fair    Clinical Impairments Affecting Rehab Potential  (+) motivation, family support  (-) age, difficulty hearing/remembering    PT Frequency  2x / week    PT Duration  8 weeks    PT Treatment/Interventions  ADLs/Self Care Home Management;Electrical Stimulation;Therapeutic activities;Functional mobility training;Stair training;Gait training;DME Instruction;Therapeutic exercise;Balance training;Neuromuscular re-education;Patient/family education;Manual techniques;Passive range of motion;Energy conservation;Taping    PT Next Visit Plan  conitnue progression of ambulation, stairs, increase leg press resistance or consider single leg; add in dual tasking elements     PT Home Exercise Plan  see sheet    Consulted and Agree with Plan of Care  Patient       Patient will benefit from skilled therapeutic intervention in order to improve the following deficits and impairments:  Abnormal gait, Decreased activity tolerance, Decreased balance, Decreased knowledge of precautions, Decreased endurance,  Decreased knowledge of use of DME, Decreased mobility, Decreased safety awareness, Difficulty walking, Decreased strength, Improper body mechanics  Visit Diagnosis: Muscle weakness (generalized)  Other abnormalities of gait and mobility  Unsteadiness on feet     Problem List Patient Active Problem List   Diagnosis Date Noted  . Hypertensive urgency 12/31/2016  . Stroke ([place]) 10/15/2016  . TIA (transient ischemic attack) 03/07/2016  . Acute CVA (cerebrovascular accident) ([place]) 07/03/2015   Janna Arch, PT, DPT   01/26/2018, 5:39 PM  [place] MAIN [facility] SERVICES [address] [place], [place], [address] Phone: [phone]   Fax:  [phone]  Name: Christopher [name] MRN: [id number] Date of Birth: 08-05-1925

## 2018-01-27 ENCOUNTER — Ambulatory Visit: Payer: Medicare Other | Admitting: Physical Therapy

## 2018-01-28 ENCOUNTER — Encounter: Payer: Self-pay | Admitting: Physical Therapy

## 2018-01-28 ENCOUNTER — Ambulatory Visit: Payer: Medicare Other | Admitting: Physical Therapy

## 2018-01-28 DIAGNOSIS — R2689 Other abnormalities of gait and mobility: Secondary | ICD-10-CM

## 2018-01-28 DIAGNOSIS — M6281 Muscle weakness (generalized): Secondary | ICD-10-CM

## 2018-01-28 DIAGNOSIS — R269 Unspecified abnormalities of gait and mobility: Secondary | ICD-10-CM

## 2018-01-28 DIAGNOSIS — R2681 Unsteadiness on feet: Secondary | ICD-10-CM

## 2018-01-28 NOTE — Therapy (Signed)
[place] MAIN [facility] SERVICES [address] [place], [place], [address] Phone: [phone]   Fax:  [phone]  Physical Therapy Treatment  Patient Details  Name: Christopher [name] MRN: [id number] Date of Birth: 12-04-1925 Referring Provider: Dr. Chryl Heck   Encounter Date: 01/28/2018  PT End of Session - 01/28/18 1702    Visit Number  23    Number of Visits  32    Date for PT Re-Evaluation  02/03/18    Authorization Type  5/10    PT Start Time  0457    PT Stop Time  0535    PT Time Calculation (min)  38 min    Equipment Utilized During Treatment  Gait belt    Activity Tolerance  Patient tolerated treatment well    Behavior During Therapy  [facility] for tasks assessed/performed       Past Medical History:  Diagnosis Date  . Arthritis   . Ataxia   . Diabetes mellitus without complication ([place])    controlled, pt checks it in the morning   . Dyslipidemia   . Hypertension    somewhat controlled, pt checks every morning, he reports it has been good.  . Stroke ([place])   . TIA (transient ischemic attack)     Past Surgical History:  Procedure Laterality Date  . none      There were no vitals filed for this visit.  Subjective Assessment - 01/28/18 1701    Subjective  Patient reports going to the doctor since last session but is confused when attempting to explain what happened. No falls or pain reported.     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 2017, and CVA Dec 2016. Was seen at this facility by a different therapist until 03/2017. Patient reports that he was doing his exercises at home when his nurse would come. Now he reports he does not have a nurse coming to his house and does not do his exercises.  Reports that he is not walking as fast now as he was able to before or for as long of distances. Reports no  falls.     Limitations  Walking;House hold activities;Other (comment)    How long can you sit comfortably?  n/a    How long can you stand comfortably?  n/a    How long can you walk comfortably?  across his house     Diagnostic tests  MR 6/27: IMPRESSION: No acute intracranial abnormality identified. Small chronic infarcts in basal ganglia, left paramedian pons,and right frontal subcortical white matter. Stable moderate chronic microvascular ischemic changes and mild parenchymal volume loss of the brain. Single interval punctate focus of microhemorrhage and left brachium pontis. Additional scattered stable chronic foci of microhemorrhage with central predominance, likely related to chronic hypertension.    Patient Stated Goals  Increase gait speed and walk for longer    Currently in Pain?  No/denies    Pain Score  0-No pain        Treatment:   Standing on 1/2 bolster (Flat side up): Heel/toe rock x15 with finger tip hold for balance with cues to keep knee straight for better ankle strategies  Feet in neutral, BUE wand flexion x15 reps with min A for safety and cues to improve weight shift for better stance control; patient was able to utilize  ankle strategies well with less loss of balance  Modified tandem stance on floor 10 sec hold unsupported x2 reps each foot in front with min A for safety and cues to improve upper weight shift and trunk control for better balance in narrow base of support  Modified tandem stance on floor 10 sec hold unsupported trunk rotation with rod left and right x2 reps each foot in front with min A for safety and cues to improve upper weight shift and trunk control for better balance in narrow base of support   Fwd bwd step over hurdle x2 reps with min VCs for correct positioning; Forward/backward, side/side teeter x2 min each with rail assist for safety and cues to improve weight shift for better dynamic balance  Matrix 22. 5 lbs x 5 reps side stepping left and  right with min assist  Leg press 90 lbs x 20 x 2 with cues for going slow    Patient tolerated session well; denies any pain but does report some fatigue at end of session.                     PT Education - 01/28/18 1701    Education provided  Yes    Education Details  HEP    Person(s) Educated  Patient    Methods  Explanation;Demonstration;Tactile cues;Verbal cues    Comprehension  Verbalized understanding;Returned demonstration;Verbal cues required;Tactile cues required       PT Short Term Goals - 12/09/17 1623      PT SHORT TERM GOAL #1   Title  Patient will be independent in home exercise program to improve strength/mobility for better functional independence with ADLs.    Baseline  HEP given     Time  2    Period  Weeks    Status  Partially Met    Target Date  12/23/17      PT SHORT TERM GOAL #2   Title  Patient will tolerate 5 seconds of single leg stance without loss of balance to improve ability to get in and out of shower safely.    Baseline  3 seconds    Time  2    Period  Weeks    Status  Partially Met    Target Date  12/23/17      PT SHORT TERM GOAL #3   Title  Patient will increase six minute walk test distance to 600 ft for progression to community ambulator and improve gait ability    Baseline  441 ft; 6/5: 793    Time  2    Period  Weeks    Status  Achieved      PT SHORT TERM GOAL #4   Title  Patient will increase BLE gross strength to 4+/5 as to improve functional strength for independent gait, increased standing tolerance and increased ADL ability.    Baseline  4-/5; 6/5 4/5    Time  2    Period  Weeks    Status  Achieved        PT Long Term Goals - 01/11/18 1611      PT LONG TERM GOAL #1   Title  Patient will demonstrate an improved Berg Balance Score of >50/56 as to demonstrate improved balance with ADLs such as sitting/standing and transfer balance and reduced fall risk    Baseline  42/56; 6/5: 47/56 7/8: 51/56 seconds     Time  8    Period  Weeks  Status  Achieved      PT LONG TERM GOAL #2   Title  Patient will increase 10 meter walk test to >1.87ms as to improve gait speed for better community ambulation and to reduce fall risk    Baseline  .275m with quad cane; 65: .83 m/s with no AD; 7/8: .91 m/s without AD     Time  8    Period  Weeks    Status  Partially Met    Target Date  02/03/18      PT LONG TERM GOAL #3   Title  Patient will be modified independent in walking on even/uneven surface with least restrictive assistive device, for 20+ minutes without rest break, reporting some difficulty or less to improve walking tolerance with community ambulation including grocery shopping, going to church,etc.     Baseline  4/23: unable to walk that distance; 6/5: challenged with 6 minutes on stable surface    Time  8    Period  Weeks    Status  Partially Met    Target Date  02/03/18      PT LONG TERM GOAL #4   Title  Patient will increase six minute walk test distance to >800 for progression to community ambulator and improve gait ability    Baseline  4/23: 441 ft 6/5: 793 with 3 episodes of LOB and frequent shuffling of LEs. ; 7/8: 821 ft    Time  8    Period  Weeks    Status  Achieved      PT LONG TERM GOAL #5   Title  Patient will perform SLS >5 seconds bilaterally to increase stability when getting in/out of shower    Baseline  7/8: 3 seconds    Time  8    Period  Weeks    Status  New    Target Date  02/03/18      PT LONG TERM GOAL #6   Title  Patient will increase six minute walk test distance to >1000 for progression to community ambulator and improve gait ability    Baseline  7/8: 821 ft    Time  8    Period  Weeks    Status  New    Target Date  02/03/18            Plan - 01/28/18 1704    Clinical Impression Statement   Pt was able to perform all exercises today with CGA.. Christopher [name] was able to perform all balance and strength exercises, demonstrating improvements in LE strength and  stability.  Pt was able to complete dynamic balance exercises, showing ability to stand on even surfaces with min assist and improve postural reactions to correct self during activities.  Pt requires verbal, visual and tactile cues during exercise in order to complete tasks with proper form and technique, as well as to stay on task.  Pt would continue to benefit from skilled PT services in order to further strengthen LE's, improve static and dynamic balance, and improve coordination in order to increase functional mobility and decrease risk of falls    Rehab Potential  Fair    Clinical Impairments Affecting Rehab Potential  (+) motivation, family support  (-) age, difficulty hearing/remembering    PT Frequency  2x / week    PT Duration  8 weeks    PT Treatment/Interventions  ADLs/Self Care Home Management;Electrical Stimulation;Therapeutic activities;Functional mobility training;Stair training;Gait training;DME Instruction;Therapeutic exercise;Balance training;Neuromuscular re-education;Patient/family education;Manual techniques;Passive range of motion;Energy conservation;Taping  PT Next Visit Plan  conitnue progression of ambulation, stairs, increase leg press resistance or consider single leg; add in dual tasking elements     PT Home Exercise Plan  see sheet    Consulted and Agree with Plan of Care  Patient       Patient will benefit from skilled therapeutic intervention in order to improve the following deficits and impairments:  Abnormal gait, Decreased activity tolerance, Decreased balance, Decreased knowledge of precautions, Decreased endurance, Decreased knowledge of use of DME, Decreased mobility, Decreased safety awareness, Difficulty walking, Decreased strength, Improper body mechanics  Visit Diagnosis: Muscle weakness (generalized)  Other abnormalities of gait and mobility  Unsteadiness on feet  Abnormality of gait     Problem List Patient Active Problem List   Diagnosis Date  Noted  . Hypertensive urgency 12/31/2016  . Stroke ([place]) 10/15/2016  . TIA (transient ischemic attack) 03/07/2016  . Acute CVA (cerebrovascular accident) ([place]) 07/03/2015    Alanson Puls, PT DPT 01/28/2018, 5:07 PM  [place] MAIN [facility] SERVICES [address] [place], [place], [address] Phone: [phone]   Fax:  [phone]  Name: Christopher [name] MRN: [id number] Date of Birth: January 20, 1926

## 2018-02-01 ENCOUNTER — Ambulatory Visit: Payer: Medicare Other

## 2018-02-01 DIAGNOSIS — M6281 Muscle weakness (generalized): Secondary | ICD-10-CM

## 2018-02-01 DIAGNOSIS — R2681 Unsteadiness on feet: Secondary | ICD-10-CM

## 2018-02-01 DIAGNOSIS — R2689 Other abnormalities of gait and mobility: Secondary | ICD-10-CM

## 2018-02-01 NOTE — Therapy (Signed)
[place] MAIN [facility] SERVICES [address] [place], [place], [address] Phone: [phone]   Fax:  [phone]  Physical Therapy Treatment Physical Therapy Progress Note   Dates of reporting period  12/09/17   to   02/01/18  Patient Details  Name: Christopher [name] MRN: [id number] Date of Birth: 1925/10/17 Referring Provider: Dr. Chryl Heck   Encounter Date: 02/01/2018  PT End of Session - 02/01/18 1612    Visit Number  24    Number of Visits  40    Date for PT Re-Evaluation  03/29/18    Authorization Type  6/10 (next 1/10 ) start 7/29    PT Start Time  1607    PT Stop Time  1645    PT Time Calculation (min)  38 min    Equipment Utilized During Treatment  Gait belt    Activity Tolerance  Patient tolerated treatment well    Behavior During Therapy  WFL for tasks assessed/performed       Past Medical History:  Diagnosis Date  . Arthritis   . Ataxia   . Diabetes mellitus without complication ([place])    controlled, pt checks it in the morning   . Dyslipidemia   . Hypertension    somewhat controlled, pt checks every morning, he reports it has been good.  . Stroke ([place])   . TIA (transient ischemic attack)     Past Surgical History:  Procedure Laterality Date  . none      There were no vitals filed for this visit.  Subjective Assessment - 02/01/18 1632    Subjective  Patient arrived on wrong day. Reports feeling very tired due to not sleeping well last night but that son insisted patient go to therapy.     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 2017, and CVA Dec 2016. Was seen at this facility by a different therapist until 03/2017. Patient reports that he was doing his exercises at home when his nurse would come. Now he reports he does not have a nurse coming to his house and does not do his exercises.   Reports that he is not walking as fast now as he was able to before or for as long of distances. Reports no falls.     Limitations  Walking;House hold activities;Other (comment)    How long can you sit comfortably?  n/a    How long can you stand comfortably?  n/a    How long can you walk comfortably?  across his house     Diagnostic tests  MR 6/27: IMPRESSION: No acute intracranial abnormality identified. Small chronic infarcts in basal ganglia, left paramedian pons,and right frontal subcortical white matter. Stable moderate chronic microvascular ischemic changes and mild parenchymal volume loss of the brain. Single interval punctate focus of microhemorrhage and left brachium pontis. Additional scattered stable chronic foci of microhemorrhage with central predominance, likely related to chronic hypertension.    Patient Stated Goals  Increase gait speed and walk for longer    Currently in Pain?  No/denies      10 MWT =.81ms  6 min walk test : 725 ft without AD  Severe crouching gait by end of walk due to fatigue, increased shuffle and drag of L foot.   5lb ankle weights:   Seated marches 20x 2 sets, cues  for keeping back away from chair for upright posture and for slowing activation.    LAQ: 5 second holds, 10x each leg   Blue TB hamstring curl 15x each leg   Blue TB Abduction 15x   Blue TB df 10x each leg  Patient excessively fatigued at end of walk and unable/unwilling to perform standing interventions.     Patient's condition has the potential to improve in response to therapy. Maximum improvement is yet to be obtained. The anticipated improvement is attainable and reasonable in a generally predictable time. Start date of reporting period 12/09/17 end date of reporting period 02/01/18. Patient reports that he has been doing better everyday that just today is a bad day.                 PT Education - 02/01/18 1634    Education provided  Yes    Education Details  exercise  technique, goals, POC     Person(s) Educated  Patient    Methods  Explanation;Demonstration;Verbal cues    Comprehension  Verbalized understanding;Returned demonstration       PT Short Term Goals - 02/01/18 1706      PT SHORT TERM GOAL #1   Title  Patient will be independent in home exercise program to improve strength/mobility for better functional independence with ADLs.    Baseline  HEP given     Time  2    Period  Weeks    Status  Partially Met      PT SHORT TERM GOAL #2   Title  Patient will tolerate 5 seconds of single leg stance without loss of balance to improve ability to get in and out of shower safely.    Baseline  3 seconds    Time  2    Period  Weeks    Status  Partially Met    Target Date  02/15/18      PT SHORT TERM GOAL #3   Title  Patient will increase six minute walk test distance to 600 ft for progression to community ambulator and improve gait ability    Baseline  441 ft; 6/5: 793    Time  2    Period  Weeks    Status  Achieved      PT SHORT TERM GOAL #4   Title  Patient will increase BLE gross strength to 4+/5 as to improve functional strength for independent gait, increased standing tolerance and increased ADL ability.    Baseline  4-/5; 6/5 4/5    Time  2    Period  Weeks    Status  Achieved        PT Long Term Goals - 02/01/18 1628      PT LONG TERM GOAL #1   Title  Patient will demonstrate an improved Berg Balance Score of >50/56 as to demonstrate improved balance with ADLs such as sitting/standing and transfer balance and reduced fall risk    Baseline  42/56; 6/5: 47/56 7/8: 51/56 seconds    Time  8    Period  Weeks    Status  Achieved      PT LONG TERM GOAL #2   Title  Patient will increase 10 meter walk test to >1.61ms as to improve gait speed for better community ambulation and to reduce fall risk    Baseline  .225m with quad cane; 65: .83 m/s with no AD; 7/8: .91 m/s without AD 7/29: .77 m/s without AD  Time  8    Period  Weeks     Status  Partially Met    Target Date  03/29/18      PT LONG TERM GOAL #3   Title  Patient will be modified independent in walking on even/uneven surface with least restrictive assistive device, for 20+ minutes without rest break, reporting some difficulty or less to improve walking tolerance with community ambulation including grocery shopping, going to [place].     Baseline  4/23: unable to walk that distance; 6/5: challenged with 6 minutes on stable surface    Time  8    Period  Weeks    Status  Partially Met    Target Date  03/29/18      PT LONG TERM GOAL #4   Title  Patient will increase six minute walk test distance to >800 for progression to community ambulator and improve gait ability    Baseline  4/23: 441 ft 6/5: 793 with 3 episodes of LOB and frequent shuffling of LEs. ; 7/8: 821 ft    Time  8    Period  Weeks    Status  Achieved      PT LONG TERM GOAL #5   Title  Patient will perform SLS >5 seconds bilaterally to increase stability when getting in/out of shower    Baseline  7/8: 3 seconds    Time  8    Period  Weeks    Status  On-going    Target Date  03/29/18      PT LONG TERM GOAL #6   Title  Patient will increase six minute walk test distance to >1000 for progression to community ambulator and improve gait ability    Baseline  7/8: 821 ft 7/29: 725 ft without AD     Time  8    Period  Weeks    Status  On-going    Target Date  03/29/18            Plan - 02/01/18 1710    Clinical Impression Statement  Patient arrived on wrong day for session and late limiting time and duration of session. Patient was fatigued throughout session limiting his speed and duration of ambulation. Patient demonstrated improved speed and capacity for functional ambulation at last progress report than today's session due to fatigue.Patient's condition has the potential to improve in response to therapy. Maximum improvement is yet to be obtained. The anticipated improvement is  attainable and reasonable in a generally predictable time. Patient has continued foot drag with fatigue with limited single limb stance at this time. Pt would continue to benefit from skilled PT services in order to further strengthen LE's, improve static and dynamic balance, and improve coordination in order to increase functional mobility and decrease risk of falls    Rehab Potential  Fair    Clinical Impairments Affecting Rehab Potential  (+) motivation, family support  (-) age, difficulty hearing/remembering    PT Frequency  2x / week    PT Duration  8 weeks    PT Treatment/Interventions  ADLs/Self Care Home Management;Electrical Stimulation;Therapeutic activities;Functional mobility training;Stair training;Gait training;DME Instruction;Therapeutic exercise;Balance training;Neuromuscular re-education;Patient/family education;Manual techniques;Passive range of motion;Energy conservation;Taping    PT Next Visit Plan  conitnue progression of ambulation, stairs, increase leg press resistance or consider single leg; add in dual tasking elements     PT Home Exercise Plan  see sheet    Consulted and Agree with Plan of Care  Patient  Patient will benefit from skilled therapeutic intervention in order to improve the following deficits and impairments:  Abnormal gait, Decreased activity tolerance, Decreased balance, Decreased knowledge of precautions, Decreased endurance, Decreased knowledge of use of DME, Decreased mobility, Decreased safety awareness, Difficulty walking, Decreased strength, Improper body mechanics  Visit Diagnosis: Muscle weakness (generalized)  Other abnormalities of gait and mobility  Unsteadiness on feet     Problem List Patient Active Problem List   Diagnosis Date Noted  . Hypertensive urgency 12/31/2016  . Stroke ([place]) 10/15/2016  . TIA (transient ischemic attack) 03/07/2016  . Acute CVA (cerebrovascular accident) ([place]) 07/03/2015   Janna Arch, PT,  DPT   02/01/2018, 5:45 PM  [place] MAIN [facility] SERVICES [address] [place], [place], [address] Phone: [phone]   Fax:  [phone]  Name: Christopher [name] MRN: [id number] Date of Birth: [birth year]

## 2018-02-02 ENCOUNTER — Ambulatory Visit: Payer: Medicare Other

## 2018-02-04 ENCOUNTER — Ambulatory Visit: Payer: Medicare Other | Attending: Family Medicine | Admitting: Physical Therapy

## 2018-02-04 ENCOUNTER — Encounter: Payer: Self-pay | Admitting: Physical Therapy

## 2018-02-04 DIAGNOSIS — M6281 Muscle weakness (generalized): Secondary | ICD-10-CM | POA: Diagnosis present

## 2018-02-04 DIAGNOSIS — R2689 Other abnormalities of gait and mobility: Secondary | ICD-10-CM | POA: Insufficient documentation

## 2018-02-04 DIAGNOSIS — R2681 Unsteadiness on feet: Secondary | ICD-10-CM | POA: Diagnosis present

## 2018-02-04 DIAGNOSIS — R269 Unspecified abnormalities of gait and mobility: Secondary | ICD-10-CM | POA: Insufficient documentation

## 2018-02-04 NOTE — Therapy (Signed)
[place] MAIN [facility] SERVICES [address] [place], [place], [address] Phone: [phone]   Fax:  [phone]  Physical Therapy Treatment  Patient Details  Name: Christopher [name] MRN: [id number] Date of Birth: [birth year] Referring Provider: Dr. Chryl Heck   Encounter Date: 02/04/2018  PT End of Session - 02/04/18 1608    Visit Number  25    Number of Visits  40    Date for PT Re-Evaluation  03/29/18    Authorization Type  1/10   start 7/29    PT Start Time  1603    PT Stop Time  1645    PT Time Calculation (min)  42 min    Equipment Utilized During Treatment  Gait belt    Activity Tolerance  Patient tolerated treatment well    Behavior During Therapy  [facility] for tasks assessed/performed       Past Medical History:  Diagnosis Date  . Arthritis   . Ataxia   . Diabetes mellitus without complication ([place])    controlled, pt checks it in the morning   . Dyslipidemia   . Hypertension    somewhat controlled, pt checks every morning, he reports it has been good.  . Stroke ([place])   . TIA (transient ischemic attack)     Past Surgical History:  Procedure Laterality Date  . none      There were no vitals filed for this visit.  Subjective Assessment - 02/04/18 1607    Subjective  Patient reports doing well; denies any falls; denies any pain; reports adherence to HEP;     Patient is accompained by:  Family member    Pertinent History  Patient is a pleasant 84 year old man who presents with gait disturbances s/p CVA 10/15/16 (L [place]), TIA  Sept. 2017, and CVA Dec 2016. Was seen at this facility by a different therapist until 03/2017. Patient reports that he was doing his exercises at home when his nurse would come. Now he reports he does not have a nurse coming to his house and does not do his exercises.  Reports that he is not walking as fast now as he was able to before or for as long of distances. Reports no falls.     Limitations  Walking;House  hold activities;Other (comment)    How long can you sit comfortably?  n/a    How long can you stand comfortably?  n/a    How long can you walk comfortably?  across his house     Diagnostic tests  MR 6/27: IMPRESSION: No acute intracranial abnormality identified. Small chronic infarcts in basal ganglia, left paramedian pons,and right frontal subcortical white matter. Stable moderate chronic microvascular ischemic changes and mild parenchymal volume loss of the brain. Single interval punctate focus of microhemorrhage and left brachium pontis. Additional scattered stable chronic foci of microhemorrhage with central predominance, likely related to chronic hypertension.    Patient Stated Goals  Increase gait speed and walk for longer    Currently in Pain?  No/denies    Multiple Pain Sites  No            Treatment: Warm up on Nustep BUE/BLE level 1 x4 min (unbilled);  Standing on airex: --alternate toe taps on 4 inch step without rail assist x15 bilaterally; -standing one foot on airex, one foot on 6 inch step, BUE ball pass side/side x5 reps each direction; -modified tandem stance (staggered) BUE ball up/down x10 reps each foot in front;  Weaving around cones #5 x4 sets with CGA for safety and cues to increase step length for better gait safety; Side stepping over cones #5, x1 rep each direction with min A for safety and cues to increase step length for better foot clearance; Walking beside cone and SLS, contralateral toe tap x5 cones x1 set each LE;  Patient required min VCs for balance stability, including to increase trunk control for less loss of balance with smaller base of support  Matrix 12. 5 lbs x 2 reps forward/backward, side/side with min A for safety and cues to improve weight shift for better dynamic balance;  Leg press 105# , 2x12 with cues for going slow  Patient tolerated session well; denies any painbut does report some fatigue at end of  session.                     PT Education - 02/04/18 1608    Education provided  Yes    Education Details  exercise, balance, gait safety;     Person(s) Educated  Patient    Methods  Explanation;Demonstration;Verbal cues    Comprehension  Verbalized understanding;Returned demonstration;Verbal cues required;Need further instruction       PT Short Term Goals - 02/01/18 1706      PT SHORT TERM GOAL #1   Title  Patient will be independent in home exercise program to improve strength/mobility for better functional independence with ADLs.    Baseline  HEP given     Time  2    Period  Weeks    Status  Partially Met      PT SHORT TERM GOAL #2   Title  Patient will tolerate 5 seconds of single leg stance without loss of balance to improve ability to get in and out of shower safely.    Baseline  3 seconds    Time  2    Period  Weeks    Status  Partially Met    Target Date  02/15/18      PT SHORT TERM GOAL #3   Title  Patient will increase six minute walk test distance to 600 ft for progression to community ambulator and improve gait ability    Baseline  441 ft; 6/5: 793    Time  2    Period  Weeks    Status  Achieved      PT SHORT TERM GOAL #4   Title  Patient will increase BLE gross strength to 4+/5 as to improve functional strength for independent gait, increased standing tolerance and increased ADL ability.    Baseline  4-/5; 6/5 4/5    Time  2    Period  Weeks    Status  Achieved        PT Long Term Goals - 02/01/18 1628      PT LONG TERM GOAL #1   Title  Patient will demonstrate an improved Berg Balance Score of >50/56 as to demonstrate improved balance with ADLs such as sitting/standing and transfer balance and reduced fall risk    Baseline  42/56; 6/5: 47/56 7/8: 51/56 seconds    Time  8    Period  Weeks    Status  Achieved      PT LONG TERM GOAL #2   Title  Patient will increase 10 meter walk test to >1.63ms as to improve gait speed for better  community ambulation and to reduce fall risk    Baseline  .241m with quad cane;  65: .83 m/s with no AD; 7/8: .91 m/s without AD 7/29: .77 m/s without AD     Time  8    Period  Weeks    Status  Partially Met    Target Date  03/29/18      PT LONG TERM GOAL #3   Title  Patient will be modified independent in walking on even/uneven surface with least restrictive assistive device, for 20+ minutes without rest break, reporting some difficulty or less to improve walking tolerance with community ambulation including grocery shopping, going to church,etc.     Baseline  4/23: unable to walk that distance; 6/5: challenged with 6 minutes on stable surface    Time  8    Period  Weeks    Status  Partially Met    Target Date  03/29/18      PT LONG TERM GOAL #4   Title  Patient will increase six minute walk test distance to >800 for progression to community ambulator and improve gait ability    Baseline  4/23: 441 ft 6/5: 793 with 3 episodes of LOB and frequent shuffling of LEs. ; 7/8: 821 ft    Time  8    Period  Weeks    Status  Achieved      PT LONG TERM GOAL #5   Title  Patient will perform SLS >5 seconds bilaterally to increase stability when getting in/out of shower    Baseline  7/8: 3 seconds    Time  8    Period  Weeks    Status  On-going    Target Date  03/29/18      PT LONG TERM GOAL #6   Title  Patient will increase six minute walk test distance to >1000 for progression to community ambulator and improve gait ability    Baseline  7/8: 821 ft 7/29: 725 ft without AD     Time  8    Period  Weeks    Status  On-going    Target Date  03/29/18            Plan - 02/04/18 1646    Clinical Impression Statement  Patient instructed in advanced balance and strengthening exercise. He required cues for correct exercise technique. Patient was able to stand on compliant surface without rail assist requiring CGA to min A for safety; He does ambulate with slower gait speed and is unsteady  during turns. He would benefit from additional skilled PT intervention to improve strength, balance and gait safety;     Rehab Potential  Fair    Clinical Impairments Affecting Rehab Potential  (+) motivation, family support  (-) age, difficulty hearing/remembering    PT Frequency  2x / week    PT Duration  8 weeks    PT Treatment/Interventions  ADLs/Self Care Home Management;Electrical Stimulation;Therapeutic activities;Functional mobility training;Stair training;Gait training;DME Instruction;Therapeutic exercise;Balance training;Neuromuscular re-education;Patient/family education;Manual techniques;Passive range of motion;Energy conservation;Taping    PT Next Visit Plan  conitnue progression of ambulation, stairs, increase leg press resistance or consider single leg; add in dual tasking elements     PT Home Exercise Plan  see sheet    Consulted and Agree with Plan of Care  Patient       Patient will benefit from skilled therapeutic intervention in order to improve the following deficits and impairments:  Abnormal gait, Decreased activity tolerance, Decreased balance, Decreased knowledge of precautions, Decreased endurance, Decreased knowledge of use of DME, Decreased mobility, Decreased safety awareness, Difficulty  walking, Decreased strength, Improper body mechanics  Visit Diagnosis: Muscle weakness (generalized)  Other abnormalities of gait and mobility  Unsteadiness on feet     Problem List Patient Active Problem List   Diagnosis Date Noted  . Hypertensive urgency 12/31/2016  . Stroke ([place]) 10/15/2016  . TIA (transient ischemic attack) 03/07/2016  . Acute CVA (cerebrovascular accident) ([place]) 07/03/2015    Trotter,Margaret PT, DPT 02/04/2018, 4:48 PM  [place] MAIN [facility] SERVICES [address] [place], [place], [address] Phone: [phone]   Fax:  [phone]  Name: Christopher [name] MRN: [id number] Date of Birth: March 22, 1926

## 2018-02-09 ENCOUNTER — Ambulatory Visit: Payer: Medicare Other

## 2018-02-11 ENCOUNTER — Encounter: Payer: Self-pay | Admitting: Physical Therapy

## 2018-02-11 ENCOUNTER — Ambulatory Visit: Payer: Medicare Other | Admitting: Physical Therapy

## 2018-02-11 DIAGNOSIS — R2681 Unsteadiness on feet: Secondary | ICD-10-CM

## 2018-02-11 DIAGNOSIS — M6281 Muscle weakness (generalized): Secondary | ICD-10-CM | POA: Diagnosis not present

## 2018-02-11 DIAGNOSIS — R269 Unspecified abnormalities of gait and mobility: Secondary | ICD-10-CM

## 2018-02-11 DIAGNOSIS — R2689 Other abnormalities of gait and mobility: Secondary | ICD-10-CM

## 2018-02-11 NOTE — Therapy (Addendum)
[place] MAIN [facility] SERVICES [address] [place], [place], [address] Phone: [phone]   Fax:  [phone]  Physical Therapy Treatment  Patient Details  Name: Christopher [name] MRN: [id number] Date of Birth: [birth year] Referring Provider: Dr. Chryl Heck   Encounter Date: 02/11/2018  PT End of Session - 02/11/18 1608    Visit Number  26    Number of Visits  40    Date for PT Re-Evaluation  03/29/18    Authorization Type  2/10   start 7/29    PT Start Time  1606    PT Stop Time  1645    PT Time Calculation (min)  39 min    Equipment Utilized During Treatment  Gait belt    Activity Tolerance  Patient tolerated treatment well    Behavior During Therapy  [facility] for tasks assessed/performed       Past Medical History:  Diagnosis Date  . Arthritis   . Ataxia   . Diabetes mellitus without complication ([place])    controlled, pt checks it in the morning   . Dyslipidemia   . Hypertension    somewhat controlled, pt checks every morning, he reports it has been good.  . Stroke ([place])   . TIA (transient ischemic attack)     Past Surgical History:  Procedure Laterality Date  . none      There were no vitals filed for this visit.  Subjective Assessment - 02/11/18 1653    Subjective  Patient reports he is doing well; denies any pain or falls today.    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 2017, and CVA Dec 2016. Was seen at this facility by a different therapist until 03/2017. Patient reports that he was doing his exercises at home when his nurse would come. Now he reports he does not have a nurse coming to his house and does not do his exercises.  Reports that he is not walking as fast now as he was able to before or for as long of distances. Reports no falls.     Limitations  Walking;House hold activities;Other  (comment)    How long can you sit comfortably?  n/a    How long can you stand comfortably?  n/a    How long can you walk comfortably?  across his house     Diagnostic tests  MR 6/27: IMPRESSION: No acute intracranial abnormality identified. Small chronic infarcts in basal ganglia, left paramedian pons,and right frontal subcortical white matter. Stable moderate chronic microvascular ischemic changes and mild parenchymal volume loss of the brain. Single interval punctate focus of microhemorrhage and left brachium pontis. Additional scattered stable chronic foci of microhemorrhage with central predominance, likely related to chronic hypertension.    Patient Stated Goals  Increase gait speed and walk for longer    Currently in Pain?  No/denies      Treatment Standing on 1/2 bolster, flat side up, heel/toe rock x15 with finger tip hold for balance with cues to keep knee straight for better ankle strategies  Standing on 1/2 bolster, flat side up, feet in neutral, BUE wand flexion x15reps with min A for safety and cues to improve weight shift for better stance control; pt utilized ankle strategy to avoid LOB when lifting arms overhead  Modified tandem stance on airex pad 10  sec hold unsupported with eyes closed x3 reps each foot in front with min A for safety and VCs to focus on ankle and hip strategies to maintain balance  Matrix 12.5# forwards, side-to-side stepping x2 laps each direction, min A for safety and mod A to recover LOB when side stepping on return  Gait in hallway: -Forward gait x80 ft x2 reps with VCs to focus on heel strike with CGA-min A for safety; mod VCs required to avoid shuffling steps and increase heel strike -Backwards walking x80 ft with VCs for bigger steps and maintaining wide BOS, CGA-min A for safety and prevent LOB -Side stepping x40 ft each direction with VCs for wider steps and decreasing amount of shuffling steps  Leg press 105# x15 reps, 120# x15 reps x2 sets;  VCs for eccentric return being slowed for better motor control and strengthening                          PT Short Term Goals - 02/01/18 1706      PT SHORT TERM GOAL #1   Title  Patient will be independent in home exercise program to improve strength/mobility for better functional independence with ADLs.    Baseline  HEP given     Time  2    Period  Weeks    Status  Partially Met      PT SHORT TERM GOAL #2   Title  Patient will tolerate 5 seconds of single leg stance without loss of balance to improve ability to get in and out of shower safely.    Baseline  3 seconds    Time  2    Period  Weeks    Status  Partially Met    Target Date  02/15/18      PT SHORT TERM GOAL #3   Title  Patient will increase six minute walk test distance to 600 ft for progression to community ambulator and improve gait ability    Baseline  441 ft; 6/5: 793    Time  2    Period  Weeks    Status  Achieved      PT SHORT TERM GOAL #4   Title  Patient will increase BLE gross strength to 4+/5 as to improve functional strength for independent gait, increased standing tolerance and increased ADL ability.    Baseline  4-/5; 6/5 4/5    Time  2    Period  Weeks    Status  Achieved        PT Long Term Goals - 02/01/18 1628      PT LONG TERM GOAL #1   Title  Patient will demonstrate an improved Berg Balance Score of >50/56 as to demonstrate improved balance with ADLs such as sitting/standing and transfer balance and reduced fall risk    Baseline  42/56; 6/5: 47/56 7/8: 51/56 seconds    Time  8    Period  Weeks    Status  Achieved      PT LONG TERM GOAL #2   Title  Patient will increase 10 meter walk test to >1.36ms as to improve gait speed for better community ambulation and to reduce fall risk    Baseline  .279m with quad cane; 65: .83 m/s with no AD; 7/8: .91 m/s without AD 7/29: .77 m/s without AD     Time  8    Period  Weeks    Status  Partially Met  Target Date  03/29/18       PT LONG TERM GOAL #3   Title  Patient will be modified independent in walking on even/uneven surface with least restrictive assistive device, for 20+ minutes without rest break, reporting some difficulty or less to improve walking tolerance with community ambulation including grocery shopping, going to [place].     Baseline  4/23: unable to walk that distance; 6/5: challenged with 6 minutes on stable surface    Time  8    Period  Weeks    Status  Partially Met    Target Date  03/29/18      PT LONG TERM GOAL #4   Title  Patient will increase six minute walk test distance to >800 for progression to community ambulator and improve gait ability    Baseline  4/23: 441 ft 6/5: 793 with 3 episodes of LOB and frequent shuffling of LEs. ; 7/8: 821 ft    Time  8    Period  Weeks    Status  Achieved      PT LONG TERM GOAL #5   Title  Patient will perform SLS >5 seconds bilaterally to increase stability when getting in/out of shower    Baseline  7/8: 3 seconds    Time  8    Period  Weeks    Status  On-going    Target Date  03/29/18      PT LONG TERM GOAL #6   Title  Patient will increase six minute walk test distance to >1000 for progression to community ambulator and improve gait ability    Baseline  7/8: 821 ft 7/29: 725 ft without AD     Time  8    Period  Weeks    Status  On-going    Target Date  03/29/18            Plan - 02/11/18 1641    Clinical Impression Statement  Patient tolerated therapy session well. Pt instructed in dynamic gait and balance activities; required VCs for correct exercise technique and increasing step length. Pt required CGA-min A for safety to prevent LOB; VCs required to encourage heel strike with ambulation. Patient was able to stand on compliant surface without UE support and with eyes closed with CGA-min A for safety. Pt demonstrated decreased gait speed and shuffling steps when not cued for heel strike during gait. Pt would benefit from additional  skilled PT intervention for improvements in strength, balance, and gait safety.     Rehab Potential  Fair    Clinical Impairments Affecting Rehab Potential  (+) motivation, family support  (-) age, difficulty hearing/remembering    PT Frequency  2x / week    PT Duration  8 weeks    PT Treatment/Interventions  ADLs/Self Care Home Management;Electrical Stimulation;Therapeutic activities;Functional mobility training;Stair training;Gait training;DME Instruction;Therapeutic exercise;Balance training;Neuromuscular re-education;Patient/family education;Manual techniques;Passive range of motion;Energy conservation;Taping    PT Next Visit Plan  conitnue progression of ambulation, stairs, increase leg press resistance or consider single leg; add in dual tasking elements     PT Home Exercise Plan  see sheet    Consulted and Agree with Plan of Care  Patient       Patient will benefit from skilled therapeutic intervention in order to improve the following deficits and impairments:  Abnormal gait, Decreased activity tolerance, Decreased balance, Decreased knowledge of precautions, Decreased endurance, Decreased knowledge of use of DME, Decreased mobility, Decreased safety awareness, Difficulty walking, Decreased strength, Improper body mechanics  Visit Diagnosis: Muscle weakness (generalized)  Other abnormalities of gait and mobility  Unsteadiness on feet  Abnormality of gait     Problem List Patient Active Problem List   Diagnosis Date Noted  . Hypertensive urgency 12/31/2016  . Stroke ([place]) 10/15/2016  . TIA (transient ischemic attack) 03/07/2016  . Acute CVA (cerebrovascular accident) ([place]) 07/03/2015   Harriet Masson, SPT  This entire session was performed under direct supervision and direction of a licensed therapist/therapist assistant . I have personally read, edited and approve of the note as written. Hillis Range, PT, DPT 02/15/18 11:41 AM   [place] MAIN [facility] SERVICES [address] [place], [place], [address] Phone: [phone]   Fax:  [phone]  Name: Christopher [name] MRN: [id number] Date of Birth: 13-Dec-1925

## 2018-02-16 ENCOUNTER — Ambulatory Visit: Payer: Medicare Other

## 2018-02-16 DIAGNOSIS — R2681 Unsteadiness on feet: Secondary | ICD-10-CM

## 2018-02-16 DIAGNOSIS — M6281 Muscle weakness (generalized): Secondary | ICD-10-CM

## 2018-02-16 DIAGNOSIS — R2689 Other abnormalities of gait and mobility: Secondary | ICD-10-CM

## 2018-02-16 NOTE — Therapy (Signed)
[place] MAIN [facility] SERVICES [address] [place], [place], [address] Phone: [phone]   Fax:  [phone]  Physical Therapy Treatment  Patient Details  Name: Christopher [name] MRN: [id number] Date of Birth: [birth year] Referring Provider: Dr. Chryl Heck   Encounter Date: 02/16/2018  PT End of Session - 02/16/18 1605    Visit Number  27    Number of Visits  40    Date for PT Re-Evaluation  03/29/18    Authorization Type  3/10   start 7/29    PT Start Time  1600    PT Stop Time  1645    PT Time Calculation (min)  45 min    Equipment Utilized During Treatment  Gait belt    Activity Tolerance  Patient tolerated treatment well    Behavior During Therapy  [facility] for tasks assessed/performed       Past Medical History:  Diagnosis Date  . Arthritis   . Ataxia   . Diabetes mellitus without complication ([place])    controlled, pt checks it in the morning   . Dyslipidemia   . Hypertension    somewhat controlled, pt checks every morning, he reports it has been good.  . Stroke ([place])   . TIA (transient ischemic attack)     Past Surgical History:  Procedure Laterality Date  . none      There were no vitals filed for this visit.  Subjective Assessment - 02/16/18 1604    Subjective  Patient reports doing well, Reports no pain or falls.     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 2017, and CVA Dec 2016. Was seen at this facility by a different therapist until 03/2017. Patient reports that he was doing his exercises at home when his nurse would come. Now he reports he does not have a nurse coming to his house and does not do his exercises.  Reports that he is not walking as fast now as he was able to before or for as long of distances. Reports no falls.     Limitations  Walking;House hold activities;Other (comment)     How long can you sit comfortably?  n/a    How long can you stand comfortably?  n/a    How long can you walk comfortably?  across his house     Diagnostic tests  MR 6/27: IMPRESSION: No acute intracranial abnormality identified. Small chronic infarcts in basal ganglia, left paramedian pons,and right frontal subcortical white matter. Stable moderate chronic microvascular ischemic changes and mild parenchymal volume loss of the brain. Single interval punctate focus of microhemorrhage and left brachium pontis. Additional scattered stable chronic foci of microhemorrhage with central predominance, likely related to chronic hypertension.    Patient Stated Goals  Increase gait speed and walk for longer    Currently in Pain?  No/denies     Nustep Lvl 4 4 minutes RPM>60 for cardiovascular challenge  Airex pad: tandem stance one foot on each airex pad 1x 60 seconds each foot in front, BLE  Half foam roller; flat part up tandem stance 1x 60 seconds each leg forward, frequent LOB to left despite which foot is stabilizer.   6" step eccentric heel taps 10x each leg. SUE support    Matrix 12.5# forwards, side-to-side stepping x2 laps each direction, min A for  safety and mod A to recover LOB when side stepping on return   Gait in hallway: -Forward gait x80 ft x2 reps with VCs to focus on heel strike with CGA-min A for safety; mod VCs required to avoid shuffling steps and increase heel strike   Leg press 120# x15 reps x1 set ; #130 leg press 15x ; VCs for eccentric return being slowed for better motor control and strengthening   Leg press; single leg, #75 10x each leg 2 sets   seated toe raises 20x (df AROM)                   PT Education - 02/16/18 1604    Education provided  Yes    Education Details  exercise technique, stability     Person(s) Educated  Patient    Methods  Explanation;Demonstration;Verbal cues    Comprehension  Verbalized understanding;Returned demonstration        PT Short Term Goals - 02/01/18 1706      PT SHORT TERM GOAL #1   Title  Patient will be independent in home exercise program to improve strength/mobility for better functional independence with ADLs.    Baseline  HEP given     Time  2    Period  Weeks    Status  Partially Met      PT SHORT TERM GOAL #2   Title  Patient will tolerate 5 seconds of single leg stance without loss of balance to improve ability to get in and out of shower safely.    Baseline  3 seconds    Time  2    Period  Weeks    Status  Partially Met    Target Date  02/15/18      PT SHORT TERM GOAL #3   Title  Patient will increase six minute walk test distance to 600 ft for progression to community ambulator and improve gait ability    Baseline  441 ft; 6/5: 793    Time  2    Period  Weeks    Status  Achieved      PT SHORT TERM GOAL #4   Title  Patient will increase BLE gross strength to 4+/5 as to improve functional strength for independent gait, increased standing tolerance and increased ADL ability.    Baseline  4-/5; 6/5 4/5    Time  2    Period  Weeks    Status  Achieved        PT Long Term Goals - 02/01/18 1628      PT LONG TERM GOAL #1   Title  Patient will demonstrate an improved Berg Balance Score of >50/56 as to demonstrate improved balance with ADLs such as sitting/standing and transfer balance and reduced fall risk    Baseline  42/56; 6/5: 47/56 7/8: 51/56 seconds    Time  8    Period  Weeks    Status  Achieved      PT LONG TERM GOAL #2   Title  Patient will increase 10 meter walk test to >1.40ms as to improve gait speed for better community ambulation and to reduce fall risk    Baseline  .282m with quad cane; 65: .83 m/s with no AD; 7/8: .91 m/s without AD 7/29: .77 m/s without AD     Time  8    Period  Weeks    Status  Partially Met    Target Date  03/29/18  PT LONG TERM GOAL #3   Title  Patient will be modified independent in walking on even/uneven surface with least  restrictive assistive device, for 20+ minutes without rest break, reporting some difficulty or less to improve walking tolerance with community ambulation including grocery shopping, going to [place].     Baseline  4/23: unable to walk that distance; 6/5: challenged with 6 minutes on stable surface    Time  8    Period  Weeks    Status  Partially Met    Target Date  03/29/18      PT LONG TERM GOAL #4   Title  Patient will increase six minute walk test distance to >800 for progression to community ambulator and improve gait ability    Baseline  4/23: 441 ft 6/5: 793 with 3 episodes of LOB and frequent shuffling of LEs. ; 7/8: 821 ft    Time  8    Period  Weeks    Status  Achieved      PT LONG TERM GOAL #5   Title  Patient will perform SLS >5 seconds bilaterally to increase stability when getting in/out of shower    Baseline  7/8: 3 seconds    Time  8    Period  Weeks    Status  On-going    Target Date  03/29/18      PT LONG TERM GOAL #6   Title  Patient will increase six minute walk test distance to >1000 for progression to community ambulator and improve gait ability    Baseline  7/8: 821 ft 7/29: 725 ft without AD     Time  8    Period  Weeks    Status  On-going    Target Date  03/29/18            Plan - 02/16/18 1639    Clinical Impression Statement  Patient challenged by heel strike with ambulation when fatigued or has altered center of attention requiring frequent verbal cueing for heel strike. Patient static balance on unstable surfaces is improved with decreased trunk sway and LOB. Patient requires occasional visual/demonstrative cueing for task sequencing due to difficulty understanding. Pt would benefit from additional skilled PT intervention for improvements in strength, balance, and gait safety.     Rehab Potential  Fair    Clinical Impairments Affecting Rehab Potential  (+) motivation, family support  (-) age, difficulty hearing/remembering    PT Frequency  2x /  week    PT Duration  8 weeks    PT Treatment/Interventions  ADLs/Self Care Home Management;Electrical Stimulation;Therapeutic activities;Functional mobility training;Stair training;Gait training;DME Instruction;Therapeutic exercise;Balance training;Neuromuscular re-education;Patient/family education;Manual techniques;Passive range of motion;Energy conservation;Taping    PT Next Visit Plan  conitnue progression of ambulation, stairs, increase leg press resistance or consider single leg; add in dual tasking elements     PT Home Exercise Plan  see sheet    Consulted and Agree with Plan of Care  Patient       Patient will benefit from skilled therapeutic intervention in order to improve the following deficits and impairments:  Abnormal gait, Decreased activity tolerance, Decreased balance, Decreased knowledge of precautions, Decreased endurance, Decreased knowledge of use of DME, Decreased mobility, Decreased safety awareness, Difficulty walking, Decreased strength, Improper body mechanics  Visit Diagnosis: Muscle weakness (generalized)  Other abnormalities of gait and mobility  Unsteadiness on feet     Problem List Patient Active Problem List   Diagnosis Date Noted  . Hypertensive urgency 12/31/2016  .  Stroke ([place]) 10/15/2016  . TIA (transient ischemic attack) 03/07/2016  . Acute CVA (cerebrovascular accident) ([place]) 07/03/2015   Janna Arch, PT, DPT   02/16/2018, 5:00 PM  [place] MAIN [facility] SERVICES [address] [place], [place], [address] Phone: [phone]   Fax:  [phone]  Name: Christopher [name] MRN: [id number] Date of Birth: 06/11/1926

## 2018-02-18 ENCOUNTER — Ambulatory Visit: Payer: Medicare Other

## 2018-02-18 DIAGNOSIS — M6281 Muscle weakness (generalized): Secondary | ICD-10-CM | POA: Diagnosis not present

## 2018-02-18 DIAGNOSIS — R2681 Unsteadiness on feet: Secondary | ICD-10-CM

## 2018-02-18 DIAGNOSIS — R2689 Other abnormalities of gait and mobility: Secondary | ICD-10-CM

## 2018-02-18 NOTE — Therapy (Signed)
[place] MAIN [facility] SERVICES [address] [place], [place], [address] Phone: [phone]   Fax:  [phone]  Physical Therapy Treatment  Patient Details  Name: Christopher [name] MRN: [id number] Date of Birth: 09/30/1925 Referring Provider: Dr. Chryl Heck   Encounter Date: 02/18/2018  PT End of Session - 02/18/18 1640    Visit Number  28    Number of Visits  40    Date for PT Re-Evaluation  03/29/18    Authorization Type  4/10   start 7/29    PT Start Time  1608    PT Stop Time  1646    PT Time Calculation (min)  38 min    Equipment Utilized During Treatment  Gait belt    Activity Tolerance  Patient tolerated treatment well    Behavior During Therapy  [facility] for tasks assessed/performed       Past Medical History:  Diagnosis Date  . Arthritis   . Ataxia   . Diabetes mellitus without complication ([place])    controlled, pt checks it in the morning   . Dyslipidemia   . Hypertension    somewhat controlled, pt checks every morning, he reports it has been good.  . Stroke ([place])   . TIA (transient ischemic attack)     Past Surgical History:  Procedure Laterality Date  . none      There were no vitals filed for this visit.  Subjective Assessment - 02/18/18 1640    Subjective  Patient arrives late to session . Reports no pain or falls.     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 2017, and CVA Dec 2016. Was seen at this facility by a different therapist until 03/2017. Patient reports that he was doing his exercises at home when his nurse would come. Now he reports he does not have a nurse coming to his house and does not do his exercises.  Reports that he is not walking as fast now as he was able to before or for as long of distances. Reports no falls.     Limitations  Walking;House hold activities;Other  (comment)    How long can you sit comfortably?  n/a    How long can you stand comfortably?  n/a    How long can you walk comfortably?  across his house     Diagnostic tests  MR 6/27: IMPRESSION: No acute intracranial abnormality identified. Small chronic infarcts in basal ganglia, left paramedian pons,and right frontal subcortical white matter. Stable moderate chronic microvascular ischemic changes and mild parenchymal volume loss of the brain. Single interval punctate focus of microhemorrhage and left brachium pontis. Additional scattered stable chronic foci of microhemorrhage with central predominance, likely related to chronic hypertension.    Patient Stated Goals  Increase gait speed and walk for longer    Currently in Pain?  No/denies         Standing on airex: --alternate toe taps on 4 inch step without rail assist x15 bilaterally; -side toe tap 4" step without UE support 12x each leg -weighted ball raises (2000 Gr) flexion 10x, abduction twists 10x   Tandem line walk in // bars no UE support 4x length of bars CGA   Weaving around cones #5 x4 sets with CGA for safety and cues to increase step length for better gait  safety;  Side step over cones in // bars 10x each leg no UE support   4 direction cone tap in // bars (front, side , side, back) no UE support     Leg press ; #130 leg press 15x ; VCs for eccentric return being slowed for better motor control and strengthening   Leg press; single leg, #75 10x each leg 2 sets  seated toe raises 20x (df AROM)                       PT Education - 02/18/18 1640    Education provided  Yes    Education Details  exercise technique, stability     Person(s) Educated  Patient    Methods  Explanation;Demonstration;Verbal cues    Comprehension  Verbalized understanding;Returned demonstration       PT Short Term Goals - 02/01/18 1706      PT SHORT TERM GOAL #1   Title  Patient will be independent in home exercise  program to improve strength/mobility for better functional independence with ADLs.    Baseline  HEP given     Time  2    Period  Weeks    Status  Partially Met      PT SHORT TERM GOAL #2   Title  Patient will tolerate 5 seconds of single leg stance without loss of balance to improve ability to get in and out of shower safely.    Baseline  3 seconds    Time  2    Period  Weeks    Status  Partially Met    Target Date  02/15/18      PT SHORT TERM GOAL #3   Title  Patient will increase six minute walk test distance to 600 ft for progression to community ambulator and improve gait ability    Baseline  441 ft; 6/5: 793    Time  2    Period  Weeks    Status  Achieved      PT SHORT TERM GOAL #4   Title  Patient will increase BLE gross strength to 4+/5 as to improve functional strength for independent gait, increased standing tolerance and increased ADL ability.    Baseline  4-/5; 6/5 4/5    Time  2    Period  Weeks    Status  Achieved        PT Long Term Goals - 02/01/18 1628      PT LONG TERM GOAL #1   Title  Patient will demonstrate an improved Berg Balance Score of >50/56 as to demonstrate improved balance with ADLs such as sitting/standing and transfer balance and reduced fall risk    Baseline  42/56; 6/5: 47/56 7/8: 51/56 seconds    Time  8    Period  Weeks    Status  Achieved      PT LONG TERM GOAL #2   Title  Patient will increase 10 meter walk test to >1.4ms as to improve gait speed for better community ambulation and to reduce fall risk    Baseline  .230m with quad cane; 65: .83 m/s with no AD; 7/8: .91 m/s without AD 7/29: .77 m/s without AD     Time  8    Period  Weeks    Status  Partially Met    Target Date  03/29/18      PT LONG TERM GOAL #3   Title  Patient will be modified independent  in walking on even/uneven surface with least restrictive assistive device, for 20+ minutes without rest break, reporting some difficulty or less to improve walking tolerance  with community ambulation including grocery shopping, going to [place].     Baseline  4/23: unable to walk that distance; 6/5: challenged with 6 minutes on stable surface    Time  8    Period  Weeks    Status  Partially Met    Target Date  03/29/18      PT LONG TERM GOAL #4   Title  Patient will increase six minute walk test distance to >800 for progression to community ambulator and improve gait ability    Baseline  4/23: 441 ft 6/5: 793 with 3 episodes of LOB and frequent shuffling of LEs. ; 7/8: 821 ft    Time  8    Period  Weeks    Status  Achieved      PT LONG TERM GOAL #5   Title  Patient will perform SLS >5 seconds bilaterally to increase stability when getting in/out of shower    Baseline  7/8: 3 seconds    Time  8    Period  Weeks    Status  On-going    Target Date  03/29/18      PT LONG TERM GOAL #6   Title  Patient will increase six minute walk test distance to >1000 for progression to community ambulator and improve gait ability    Baseline  7/8: 821 ft 7/29: 725 ft without AD     Time  8    Period  Weeks    Status  On-going    Target Date  03/29/18            Plan - 02/18/18 1642    Clinical Impression Statement  Patient continues to be challenged with single limb stance resulting in decreased coordination and quality of movement of LE's without UE support. Strength is improving with increased sets of weight on leg press.  CGA is required throughout session for stability due to difficulty with maintaining single limb stance and coordinating heel strike with ambulation. Pt would benefit from additional skilled PT intervention for improvements in strength, balance, and gait safety    Rehab Potential  Fair    Clinical Impairments Affecting Rehab Potential  (+) motivation, family support  (-) age, difficulty hearing/remembering    PT Frequency  2x / week    PT Duration  8 weeks    PT Treatment/Interventions  ADLs/Self Care Home Management;Electrical  Stimulation;Therapeutic activities;Functional mobility training;Stair training;Gait training;DME Instruction;Therapeutic exercise;Balance training;Neuromuscular re-education;Patient/family education;Manual techniques;Passive range of motion;Energy conservation;Taping    PT Next Visit Plan  conitnue progression of ambulation, stairs, increase leg press resistance or consider single leg; add in dual tasking elements     PT Home Exercise Plan  see sheet    Consulted and Agree with Plan of Care  Patient       Patient will benefit from skilled therapeutic intervention in order to improve the following deficits and impairments:  Abnormal gait, Decreased activity tolerance, Decreased balance, Decreased knowledge of precautions, Decreased endurance, Decreased knowledge of use of DME, Decreased mobility, Decreased safety awareness, Difficulty walking, Decreased strength, Improper body mechanics  Visit Diagnosis: Muscle weakness (generalized)  Other abnormalities of gait and mobility  Unsteadiness on feet     Problem List Patient Active Problem List   Diagnosis Date Noted  . Hypertensive urgency 12/31/2016  . Stroke ([place]) 10/15/2016  . TIA (  transient ischemic attack) 03/07/2016  . Acute CVA (cerebrovascular accident) ([place]) 07/03/2015   Janna Arch, PT, DPT   02/18/2018, 4:54 PM  [place] MAIN [facility] SERVICES [address] [place], [place], [address] Phone: [phone]   Fax:  [phone]  Name: Christopher [name] MRN: [id number] Date of Birth: 1925-10-07

## 2018-02-23 ENCOUNTER — Ambulatory Visit: Payer: Medicare Other

## 2018-02-23 DIAGNOSIS — M6281 Muscle weakness (generalized): Secondary | ICD-10-CM | POA: Diagnosis not present

## 2018-02-23 DIAGNOSIS — R2689 Other abnormalities of gait and mobility: Secondary | ICD-10-CM

## 2018-02-23 DIAGNOSIS — R2681 Unsteadiness on feet: Secondary | ICD-10-CM

## 2018-02-23 NOTE — Therapy (Signed)
[place] MAIN [facility] SERVICES [address] [place], [place], [address] Phone: [phone]   Fax:  [phone]  Physical Therapy Treatment  Patient Details  Name: Christopher [name] MRN: [id number] Date of Birth: 08/28/1925 Referring Provider: Dr. Chryl Heck   Encounter Date: 02/23/2018  PT End of Session - 02/23/18 1603    Visit Number  29    Number of Visits  40    Date for PT Re-Evaluation  03/29/18    Authorization Type  5/10   start 7/29    PT Start Time  1559    PT Stop Time  1645    PT Time Calculation (min)  46 min    Equipment Utilized During Treatment  Gait belt    Activity Tolerance  Patient tolerated treatment well    Behavior During Therapy  [facility] for tasks assessed/performed       Past Medical History:  Diagnosis Date  . Arthritis   . Ataxia   . Diabetes mellitus without complication ([place])    controlled, pt checks it in the morning   . Dyslipidemia   . Hypertension    somewhat controlled, pt checks every morning, he reports it has been good.  . Stroke ([place])   . TIA (transient ischemic attack)     Past Surgical History:  Procedure Laterality Date  . none      There were no vitals filed for this visit.  Subjective Assessment - 02/23/18 1602    Subjective  Patient reports no pain or falls since last session. Reports feeling weaker over the past 3 days at home and more unstable.     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 2017, and CVA Dec 2016. Was seen at this facility by a different therapist until 03/2017. Patient reports that he was doing his exercises at home when his nurse would come. Now he reports he does not have a nurse coming to his house and does not do his exercises.  Reports that he is not walking as fast now as he was able to before or for as long of distances. Reports no  falls.     Limitations  Walking;House hold activities;Other (comment)    How long can you sit comfortably?  n/a    How long can you stand comfortably?  n/a    How long can you walk comfortably?  across his house     Diagnostic tests  MR 6/27: IMPRESSION: No acute intracranial abnormality identified. Small chronic infarcts in basal ganglia, left paramedian pons,and right frontal subcortical white matter. Stable moderate chronic microvascular ischemic changes and mild parenchymal volume loss of the brain. Single interval punctate focus of microhemorrhage and left brachium pontis. Additional scattered stable chronic foci of microhemorrhage with central predominance, likely related to chronic hypertension.    Patient Stated Goals  Increase gait speed and walk for longer    Currently in Pain?  No/denies       BP assessed due to patient report of feeling more weak:  BP: 157/79 pulse 70  Nustep Lvl 4 3 minutes RPM>60 no charge    Leg press ; #130 leg press 15x ; VCs for eccentric return being slowed for better motor control and strengthening; #150 leg press 15x    Leg press; single leg, #75 15x each leg 2 sets  Half foam roller : flat side down: 60 seconds  Lunges onto bosu ball 10x forward,  Lateral lunges onto bosu ball 10x   Static stand on bosu 2x60 seconds, occasional UE support   Modified single limb stance; soccer ball under one leg 2x60 seconds each leg  Ambulate 160 ft with CGA and cues for heel strike and hip flexion for improved clearance of LLE.                          PT Education - 02/23/18 1603    Education provided  Yes    Education Details  call doctor about new weakness onset    Person(s) Educated  Patient    Methods  Explanation    Comprehension  Verbalized understanding       PT Short Term Goals - 02/01/18 1706      PT SHORT TERM GOAL #1   Title  Patient will be independent in home exercise program to improve strength/mobility for  better functional independence with ADLs.    Baseline  HEP given     Time  2    Period  Weeks    Status  Partially Met      PT SHORT TERM GOAL #2   Title  Patient will tolerate 5 seconds of single leg stance without loss of balance to improve ability to get in and out of shower safely.    Baseline  3 seconds    Time  2    Period  Weeks    Status  Partially Met    Target Date  02/15/18      PT SHORT TERM GOAL #3   Title  Patient will increase six minute walk test distance to 600 ft for progression to community ambulator and improve gait ability    Baseline  441 ft; 6/5: 793    Time  2    Period  Weeks    Status  Achieved      PT SHORT TERM GOAL #4   Title  Patient will increase BLE gross strength to 4+/5 as to improve functional strength for independent gait, increased standing tolerance and increased ADL ability.    Baseline  4-/5; 6/5 4/5    Time  2    Period  Weeks    Status  Achieved        PT Long Term Goals - 02/01/18 1628      PT LONG TERM GOAL #1   Title  Patient will demonstrate an improved Berg Balance Score of >50/56 as to demonstrate improved balance with ADLs such as sitting/standing and transfer balance and reduced fall risk    Baseline  42/56; 6/5: 47/56 7/8: 51/56 seconds    Time  8    Period  Weeks    Status  Achieved      PT LONG TERM GOAL #2   Title  Patient will increase 10 meter walk test to >1.87ms as to improve gait speed for better community ambulation and to reduce fall risk    Baseline  .227m with quad cane; 65: .83 m/s with no AD; 7/8: .91 m/s without AD 7/29: .77 m/s without AD     Time  8    Period  Weeks    Status  Partially Met    Target Date  03/29/18      PT LONG TERM GOAL #3   Title  Patient will be modified independent in walking on even/uneven  surface with least restrictive assistive device, for 20+ minutes without rest break, reporting some difficulty or less to improve walking tolerance with community ambulation including grocery  shopping, going to [place]  4/23: unable to walk that distance; 6/5: challenged with 6 minutes on stable surface    Time  8    Period  Weeks    Status  Partially Met    Target Date  03/29/18      PT LONG TERM GOAL #4   Title  Patient will increase six minute walk test distance to >800 for progression to community ambulator and improve gait ability    Baseline  4/23: 441 ft 6/5: 793 with 3 episodes of LOB and frequent shuffling of LEs. ; 7/8: 821 ft    Time  8    Period  Weeks    Status  Achieved      PT LONG TERM GOAL #5   Title  Patient will perform SLS >5 seconds bilaterally to increase stability when getting in/out of shower    Baseline  7/8: 3 seconds    Time  8    Period  Weeks    Status  On-going    Target Date  03/29/18      PT LONG TERM GOAL #6   Title  Patient will increase six minute walk test distance to >1000 for progression to community ambulator and improve gait ability    Baseline  7/8: 821 ft 7/29: 725 ft without AD     Time  8    Period  Weeks    Status  On-going    Target Date  03/29/18            Plan - 02/23/18 1633    Clinical Impression Statement  Patient presents to session with increased fatigue and reports of weakness over the weekend. Patient recommended to call doctor as vitals are within normal range. Stability on single limb continues to challenging.Pt would benefit from additional skilled PT intervention for improvements in strength, balance, and gait safety    Rehab Potential  Fair    Clinical Impairments Affecting Rehab Potential  (+) motivation, family support  (-) age, difficulty hearing/remembering    PT Frequency  2x / week    PT Duration  8 weeks    PT Treatment/Interventions  ADLs/Self Care Home Management;Electrical Stimulation;Therapeutic activities;Functional mobility training;Stair training;Gait training;DME Instruction;Therapeutic exercise;Balance training;Neuromuscular re-education;Patient/family education;Manual  techniques;Passive range of motion;Energy conservation;Taping    PT Next Visit Plan  conitnue progression of ambulation, stairs, increase leg press resistance or consider single leg; add in dual tasking elements     PT Home Exercise Plan  see sheet    Consulted and Agree with Plan of Care  Patient       Patient will benefit from skilled therapeutic intervention in order to improve the following deficits and impairments:  Abnormal gait, Decreased activity tolerance, Decreased balance, Decreased knowledge of precautions, Decreased endurance, Decreased knowledge of use of DME, Decreased mobility, Decreased safety awareness, Difficulty walking, Decreased strength, Improper body mechanics  Visit Diagnosis: Muscle weakness (generalized)  Other abnormalities of gait and mobility  Unsteadiness on feet     Problem List Patient Active Problem List   Diagnosis Date Noted  . Hypertensive urgency 12/31/2016  . Stroke ([place]) 10/15/2016  . TIA (transient ischemic attack) 03/07/2016  . Acute CVA (cerebrovascular accident) ([place]) 07/03/2015   Janna Arch, PT, DPT   02/23/2018, 4:54 PM  [place]  [place] MAIN [facility] SERVICES [place], [place], [address] Phone: [phone]   Fax:  [phone]  Name: Christopher [name] MRN: [id number] Date of Birth: 1925/08/14

## 2018-02-25 ENCOUNTER — Ambulatory Visit: Payer: Medicare Other

## 2018-02-25 DIAGNOSIS — R2689 Other abnormalities of gait and mobility: Secondary | ICD-10-CM

## 2018-02-25 DIAGNOSIS — M6281 Muscle weakness (generalized): Secondary | ICD-10-CM | POA: Diagnosis not present

## 2018-02-25 DIAGNOSIS — R2681 Unsteadiness on feet: Secondary | ICD-10-CM

## 2018-02-25 NOTE — Therapy (Signed)
[place] MA[facility] SERVICES [address] [place], [place], [address] Phone: [phone]   Fax:  [phone]  Physical Therapy Treatment  Patient Details  Name: Christopher [name] MRN: [id number] Date of Birth: 07-Nov-1925 Referring Provider: Dr. Chryl Heck   Encounter Date: 02/25/2018  PT End of Session - 02/25/18 1635    Visit Number  30    Number of Visits  40    Date for PT Re-Evaluation  03/29/18    Authorization Type  6/10   start 7/29    PT Start Time  1601    PT Stop Time  1645    PT Time Calculation (min)  44 min    Equipment Utilized During Treatment  Gait belt    Activity Tolerance  Patient tolerated treatment well    Behavior During Therapy  [facility] for tasks assessed/performed       Past Medical History:  Diagnosis Date  . Arthritis   . Ataxia   . Diabetes mellitus without complication ([place])    controlled, pt checks it in the morning   . Dyslipidemia   . Hypertension    somewhat controlled, pt checks every morning, he reports it has been good.  . Stroke ([place])   . TIA (transient ischemic attack)     Past Surgical History:  Procedure Laterality Date  . none      There were no vitals filed for this visit.  Subjective Assessment - 02/25/18 1604    Subjective  Patient presents to physical thearpy with no reports of pain or falls. Weakness hasn't worsened since last session.     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 2017, and CVA Dec 2016. Was seen at this facility by a different therapist until 03/2017. Patient reports that he was doing his exercises at home when his nurse would come. Now he reports he does not have a nurse coming to his house and does not do his exercises.  Reports that he is not walking as fast now as he was able to before or for as long of distances. Reports no falls.      Limitations  Walking;House hold activities;Other (comment)    How long can you sit comfortably?  n/a    How long can you stand comfortably?  n/a    How long can you walk comfortably?  across his house     Diagnostic tests  MR 6/27: IMPRESSION: No acute intracranial abnormality identified. Small chronic infarcts in basal ganglia, left paramedian pons,and right frontal subcortical white matter. Stable moderate chronic microvascular ischemic changes and mild parenchymal volume loss of the brain. Single interval punctate focus of microhemorrhage and left brachium pontis. Additional scattered stable chronic foci of microhemorrhage with central predominance, likely related to chronic hypertension.    Patient Stated Goals  Increase gait speed and walk for longer    Currently in Pain?  No/denies      Nustep Lvl 4 3 minutes RPM>60 no charge  Obstacle course: step over one consecutive half foam roller and orange hurdle around 5 cones and return. x4 trials; 2 LOB when stepping over hurdle requiring MinA to retain COM      Leg press ; #150 leg press 15x ;2 sets VCs for eccentric return being slowed for better motor control and strengthening   Leg press;  single leg, #75 15x each leg ; requires placement of feet for correct body mechanics         Half foam roller : flat side down: 60 seconds  Half foam roller: flat side down 60 second weighted ball raises   Airex pad: balloon taps 2 minutes   Lunges onto bosu ball 15x forward,; CGA    Lateral lunges onto bosu ball 15x; CGA   Modified single limb stance; soccer ball under one leg 2x60 seconds each leg    blue airex balance beam: tandem walk with SUE support 6x length with CGA;   Blue airex balance beam: side step walk with SUE support 6x length with CGA                       PT Education - 02/25/18 1635    Education provided  Yes    Education Details  weight shift negotiating obstacles, exercise technique     Person(s)  Educated  Patient    Methods  Explanation;Demonstration;Verbal cues    Comprehension  Verbalized understanding;Returned demonstration       PT Short Term Goals - 02/01/18 1706      PT SHORT TERM GOAL #1   Title  Patient will be independent in home exercise program to improve strength/mobility for better functional independence with ADLs.    Baseline  HEP given     Time  2    Period  Weeks    Status  Partially Met      PT SHORT TERM GOAL #2   Title  Patient will tolerate 5 seconds of single leg stance without loss of balance to improve ability to get in and out of shower safely.    Baseline  3 seconds    Time  2    Period  Weeks    Status  Partially Met    Target Date  02/15/18      PT SHORT TERM GOAL #3   Title  Patient will increase six minute walk test distance to 600 ft for progression to community ambulator and improve gait ability    Baseline  441 ft; 6/5: 793    Time  2    Period  Weeks    Status  Achieved      PT SHORT TERM GOAL #4   Title  Patient will increase BLE gross strength to 4+/5 as to improve functional strength for independent gait, increased standing tolerance and increased ADL ability.    Baseline  4-/5; 6/5 4/5    Time  2    Period  Weeks    Status  Achieved        PT Long Term Goals - 02/01/18 1628      PT LONG TERM GOAL #1   Title  Patient will demonstrate an improved Berg Balance Score of >50/56 as to demonstrate improved balance with ADLs such as sitting/standing and transfer balance and reduced fall risk    Baseline  42/56; 6/5: 47/56 7/8: 51/56 seconds    Time  8    Period  Weeks    Status  Achieved      PT LONG TERM GOAL #2   Title  Patient will increase 10 meter walk test to >1.58ms as to improve gait speed for better community ambulation and to reduce fall risk    Baseline  .267m with quad cane; 65: .83 m/s with no AD; 7/8: .91 m/s without AD 7/29: .77 m/s without AD  Time  8    Period  Weeks    Status  Partially Met    Target  Date  03/29/18      PT LONG TERM GOAL #3   Title  Patient will be modified independent in walking on even/uneven surface with least restrictive assistive device, for 20+ minutes without rest break, reporting some difficulty or less to improve walking tolerance with community ambulation including grocery shopping, going to [place].     Baseline  4/23: unable to walk that distance; 6/5: challenged with 6 minutes on stable surface    Time  8    Period  Weeks    Status  Partially Met    Target Date  03/29/18      PT LONG TERM GOAL #4   Title  Patient will increase six minute walk test distance to >800 for progression to community ambulator and improve gait ability    Baseline  4/23: 441 ft 6/5: 793 with 3 episodes of LOB and frequent shuffling of LEs. ; 7/8: 821 ft    Time  8    Period  Weeks    Status  Achieved      PT LONG TERM GOAL #5   Title  Patient will perform SLS >5 seconds bilaterally to increase stability when getting in/out of shower    Baseline  7/8: 3 seconds    Time  8    Period  Weeks    Status  On-going    Target Date  03/29/18      PT LONG TERM GOAL #6   Title  Patient will increase six minute walk test distance to >1000 for progression to community ambulator and improve gait ability    Baseline  7/8: 821 ft 7/29: 725 ft without AD     Time  8    Period  Weeks    Status  On-going    Target Date  03/29/18            Plan - 02/25/18 1638    Clinical Impression Statement  Patient continues to progress with negotiation of obstacles with decreased LOB when stepping over surfaces. Patient challenged with carryover of dynamic stability with walking, requiring cueing for heel strikes and lifting of leg. Pt would benefit from additional skilled PT intervention for improvements in strength, balance, and gait safety    Rehab Potential  Fair    Clinical Impairments Affecting Rehab Potential  (+) motivation, family support  (-) age, difficulty hearing/remembering    PT  Frequency  2x / week    PT Duration  8 weeks    PT Treatment/Interventions  ADLs/Self Care Home Management;Electrical Stimulation;Therapeutic activities;Functional mobility training;Stair training;Gait training;DME Instruction;Therapeutic exercise;Balance training;Neuromuscular re-education;Patient/family education;Manual techniques;Passive range of motion;Energy conservation;Taping    PT Next Visit Plan  conitnue progression of ambulation, stairs, increase leg press resistance or consider single leg; add in dual tasking elements     PT Home Exercise Plan  see sheet    Consulted and Agree with Plan of Care  Patient       Patient will benefit from skilled therapeutic intervention in order to improve the following deficits and impairments:  Abnormal gait, Decreased activity tolerance, Decreased balance, Decreased knowledge of precautions, Decreased endurance, Decreased knowledge of use of DME, Decreased mobility, Decreased safety awareness, Difficulty walking, Decreased strength, Improper body mechanics  Visit Diagnosis: Muscle weakness (generalized)  Other abnormalities of gait and mobility  Unsteadiness on feet     Problem List Patient Active  Problem List   Diagnosis Date Noted  . Hypertensive urgency 12/31/2016  . Stroke ([place]) 10/15/2016  . TIA (transient ischemic attack) 03/07/2016  . Acute CVA (cerebrovascular accident) ([place]) 07/03/2015   Janna Arch, PT, DPT   02/25/2018, 4:50 PM  [place] [facility]IN [facility] SERVICES [address] [place], [place], [address] Phone: [phone]   Fax:  [phone]  Name: Cambren Helm MRN: [id number] Date of Birth: Jul 06, 1926

## 2018-03-02 ENCOUNTER — Ambulatory Visit: Payer: Medicare Other

## 2018-03-04 ENCOUNTER — Ambulatory Visit: Payer: Medicare Other

## 2018-03-09 ENCOUNTER — Ambulatory Visit: Payer: Medicare Other | Attending: Family Medicine

## 2018-03-09 DIAGNOSIS — R2681 Unsteadiness on feet: Secondary | ICD-10-CM

## 2018-03-09 DIAGNOSIS — R2689 Other abnormalities of gait and mobility: Secondary | ICD-10-CM | POA: Diagnosis present

## 2018-03-09 DIAGNOSIS — M6281 Muscle weakness (generalized): Secondary | ICD-10-CM

## 2018-03-09 NOTE — Therapy (Signed)
[place] MAIN [facility] SERVICES [address] [place], [place], [address] Phone: [phone]   Fax:  [phone]  Physical Therapy Treatment  Patient Details  Name: Christopher [name] MRN: [id number] Date of Birth: 1925/07/21 Referring Provider: Dr. Chryl Heck   Encounter Date: 03/09/2018  PT End of Session - 03/09/18 1604    Visit Number  31    Number of Visits  40    Date for PT Re-Evaluation  03/29/18    Authorization Type  7/10   start 7/29    PT Start Time  1600    PT Stop Time  1645    PT Time Calculation (min)  45 min    Equipment Utilized During Treatment  Gait belt    Activity Tolerance  Patient tolerated treatment well    Behavior During Therapy  [facility] for tasks assessed/performed       Past Medical History:  Diagnosis Date  . Arthritis   . Ataxia   . Diabetes mellitus without complication ([place])    controlled, pt checks it in the morning   . Dyslipidemia   . Hypertension    somewhat controlled, pt checks every morning, he reports it has been good.  . Stroke ([place])   . TIA (transient ischemic attack)     Past Surgical History:  Procedure Laterality Date  . none      There were no vitals filed for this visit.  Subjective Assessment - 03/09/18 1602    Subjective  Patient reports he missed last week because he was sick. Is feeling better now. Reports no pain.     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 2017, and CVA Dec 2016. Was seen at this facility by a different therapist until 03/2017. Patient reports that he was doing his exercises at home when his nurse would come. Now he reports he does not have a nurse coming to his house and does not do his exercises.  Reports that he is not walking as fast now as he was able to before or for as long of distances. Reports no falls.     Limitations   Walking;House hold activities;Other (comment)    How long can you sit comfortably?  n/a    How long can you stand comfortably?  n/a    How long can you walk comfortably?  across his house     Diagnostic tests  MR 6/27: IMPRESSION: No acute intracranial abnormality identified. Small chronic infarcts in basal ganglia, left paramedian pons,and right frontal subcortical white matter. Stable moderate chronic microvascular ischemic changes and mild parenchymal volume loss of the brain. Single interval punctate focus of microhemorrhage and left brachium pontis. Additional scattered stable chronic foci of microhemorrhage with central predominance, likely related to chronic hypertension.    Patient Stated Goals  Increase gait speed and walk for longer    Currently in Pain?  No/denies       Nustep Lvl 4 3 minutes RPM>60 no charge   Obstacle course: step over two consecutive orange hurdles around 5 cones and return. x4 trials; 2 LOB when stepping over hurdle requiring MinA to retain COM      Leg press ; #150 leg press 15x ;2 sets VCs for eccentric return being slowed for better motor control and strengthening   Leg press; single leg, #75  15x each leg ; requires placement of feet for correct body mechanics    Ambulate 200 ft with CGA and mod verbal cueing for heel strike and hip flexion to reduce apsides of R foot drag.     Large step clap, then return to original position 10x; requires CGA due to LOB   Large step ball rotation then return to original position 5x   Half foam roller : flat side down: 60 seconds   Airex pad: balloon taps 2 minutes     blue airex balance beam: tandem walk with SUE support 6x length with CGA;    Blue airex balance beam: side step walk with occasional SUE support 6x length with CGA                          PT Education - 03/09/18 1603    Education provided  Yes    Education Details  weight shift, exercise technique     Person(s) Educated   Patient    Methods  Explanation;Demonstration;Verbal cues    Comprehension  Verbalized understanding;Returned demonstration       PT Short Term Goals - 02/01/18 1706      PT SHORT TERM GOAL #1   Title  Patient will be independent in home exercise program to improve strength/mobility for better functional independence with ADLs.    Baseline  HEP given     Time  2    Period  Weeks    Status  Partially Met      PT SHORT TERM GOAL #2   Title  Patient will tolerate 5 seconds of single leg stance without loss of balance to improve ability to get in and out of shower safely.    Baseline  3 seconds    Time  2    Period  Weeks    Status  Partially Met    Target Date  02/15/18      PT SHORT TERM GOAL #3   Title  Patient will increase six minute walk test distance to 600 ft for progression to community ambulator and improve gait ability    Baseline  441 ft; 6/5: 793    Time  2    Period  Weeks    Status  Achieved      PT SHORT TERM GOAL #4   Title  Patient will increase BLE gross strength to 4+/5 as to improve functional strength for independent gait, increased standing tolerance and increased ADL ability.    Baseline  4-/5; 6/5 4/5    Time  2    Period  Weeks    Status  Achieved        PT Long Term Goals - 02/01/18 1628      PT LONG TERM GOAL #1   Title  Patient will demonstrate an improved Berg Balance Score of >50/56 as to demonstrate improved balance with ADLs such as sitting/standing and transfer balance and reduced fall risk    Baseline  42/56; 6/5: 47/56 7/8: 51/56 seconds    Time  8    Period  Weeks    Status  Achieved      PT LONG TERM GOAL #2   Title  Patient will increase 10 meter walk test to >1.62ms as to improve gait speed for better community ambulation and to reduce fall risk    Baseline  .261m with quad cane; 65: .83 m/s with no AD; 7/8: .91 m/s without AD 7/29: .  77 m/s without AD     Time  8    Period  Weeks    Status  Partially Met    Target Date   03/29/18      PT LONG TERM GOAL #3   Title  Patient will be modified independent in walking on even/uneven surface with least restrictive assistive device, for 20+ minutes without rest break, reporting some difficulty or less to improve walking tolerance with community ambulation including grocery shopping, going to [place].     Baseline  4/23: unable to walk that distance; 6/5: challenged with 6 minutes on stable surface    Time  8    Period  Weeks    Status  Partially Met    Target Date  03/29/18      PT LONG TERM GOAL #4   Title  Patient will increase six minute walk test distance to >800 for progression to community ambulator and improve gait ability    Baseline  4/23: 441 ft 6/5: 793 with 3 episodes of LOB and frequent shuffling of LEs. ; 7/8: 821 ft    Time  8    Period  Weeks    Status  Achieved      PT LONG TERM GOAL #5   Title  Patient will perform SLS >5 seconds bilaterally to increase stability when getting in/out of shower    Baseline  7/8: 3 seconds    Time  8    Period  Weeks    Status  On-going    Target Date  03/29/18      PT LONG TERM GOAL #6   Title  Patient will increase six minute walk test distance to >1000 for progression to community ambulator and improve gait ability    Baseline  7/8: 821 ft 7/29: 725 ft without AD     Time  8    Period  Weeks    Status  On-going    Target Date  03/29/18            Plan - 03/09/18 1634    Clinical Impression Statement  Patient missed last session due to being ill. Educated on need to call and inform clinic when he will be missing a session. Patient challenged with negotiating hurdle without UE support due to single limb support requirement of the task. Foot drag with ambulation is exacerbated with fatigue. Pt would benefit from additional skilled PT intervention for improvements in strength, balance, and gait safety    Rehab Potential  Fair    Clinical Impairments Affecting Rehab Potential  (+) motivation, family  support  (-) age, difficulty hearing/remembering    PT Frequency  2x / week    PT Duration  8 weeks    PT Treatment/Interventions  ADLs/Self Care Home Management;Electrical Stimulation;Therapeutic activities;Functional mobility training;Stair training;Gait training;DME Instruction;Therapeutic exercise;Balance training;Neuromuscular re-education;Patient/family education;Manual techniques;Passive range of motion;Energy conservation;Taping    PT Next Visit Plan  conitnue progression of ambulation, stairs, increase leg press resistance or consider single leg; add in dual tasking elements     PT Home Exercise Plan  see sheet    Consulted and Agree with Plan of Care  Patient       Patient will benefit from skilled therapeutic intervention in order to improve the following deficits and impairments:  Abnormal gait, Decreased activity tolerance, Decreased balance, Decreased knowledge of precautions, Decreased endurance, Decreased knowledge of use of DME, Decreased mobility, Decreased safety awareness, Difficulty walking, Decreased strength, Improper body mechanics  Visit  Diagnosis: Muscle weakness (generalized)  Other abnormalities of gait and mobility  Unsteadiness on feet     Problem List Patient Active Problem List   Diagnosis Date Noted  . Hypertensive urgency 12/31/2016  . Stroke ([place]) 10/15/2016  . TIA (transient ischemic attack) 03/07/2016  . Acute CVA (cerebrovascular accident) ([place]) 07/03/2015   Janna Arch, PT, DPT   03/09/2018, 4:51 PM  [place] MAIN [facility] SERVICES [address] [place], [place], [address] Phone: [phone]   Fax:  [phone]  Name: Christopher [name] MRN: [id number] Date of Birth: 06-04-1926

## 2018-03-17 ENCOUNTER — Ambulatory Visit: Payer: Medicare Other

## 2018-03-17 DIAGNOSIS — R2681 Unsteadiness on feet: Secondary | ICD-10-CM

## 2018-03-17 DIAGNOSIS — M6281 Muscle weakness (generalized): Secondary | ICD-10-CM | POA: Diagnosis not present

## 2018-03-17 DIAGNOSIS — R2689 Other abnormalities of gait and mobility: Secondary | ICD-10-CM

## 2018-03-17 NOTE — Therapy (Signed)
[place] MAIN [facility] SERVICES [address] [place], [place], [address] Phone: [phone]   Fax:  [phone]  Physical Therapy Treatment  Patient Details  Name: Christopher [name] MRN: [id number] Date of Birth: Oct 20, 1925 Referring Provider: Dr. Chryl Heck   Encounter Date: 03/17/2018  PT End of Session - 03/17/18 1637    Visit Number  32    Number of Visits  40    Date for PT Re-Evaluation  03/29/18    Authorization Type  8/10   start 7/29    PT Start Time  1600    PT Stop Time  1644    PT Time Calculation (min)  44 min    Equipment Utilized During Treatment  Gait belt    Activity Tolerance  Patient tolerated treatment well    Behavior During Therapy  [facility] for tasks assessed/performed       Past Medical History:  Diagnosis Date  . Arthritis   . Ataxia   . Diabetes mellitus without complication ([place])    controlled, pt checks it in the morning   . Dyslipidemia   . Hypertension    somewhat controlled, pt checks every morning, he reports it has been good.  . Stroke ([place])   . TIA (transient ischemic attack)     Past Surgical History:  Procedure Laterality Date  . none      There were no vitals filed for this visit.  Subjective Assessment - 03/17/18 1605    Subjective  Patient presents with son who is visiting from [place]. Reports no pain or falls since last session. Still is having trouble with walking and picking up feet.     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 2017, and CVA Dec 2016. Was seen at this facility by a different therapist until 03/2017. Patient reports that he was doing his exercises at home when his nurse would come. Now he reports he does not have a nurse coming to his house and does not do his exercises.  Reports that he is not walking as fast now as he was able to before or  for as long of distances. Reports no falls.     Limitations  Walking;House hold activities;Other (comment)    How long can you sit comfortably?  n/a    How long can you stand comfortably?  n/a    How long can you walk comfortably?  across his house     Diagnostic tests  MR 6/27: IMPRESSION: No acute intracranial abnormality identified. Small chronic infarcts in basal ganglia, left paramedian pons,and right frontal subcortical white matter. Stable moderate chronic microvascular ischemic changes and mild parenchymal volume loss of the brain. Single interval punctate focus of microhemorrhage and left brachium pontis. Additional scattered stable chronic foci of microhemorrhage with central predominance, likely related to chronic hypertension.    Patient Stated Goals  Increase gait speed and walk for longer    Currently in Pain?  No/denies       Nustep Lvl 4 3 minutes RPM>60 no charge   Obstacle course: step over two consecutive orange hurdles around 5 cones and return. x4 trials; 2 LOB when stepping over hurdle requiring MinA to retain COM   Stoplight game: red to yellow, yellow to orange, orange to green: respectively: slow exaggerated, normal, and fast walking;stopping at each cone  to practice termination and initiation of momentum/movement CGA.   Figure 8 around 2 cones CGA with cues for increasing hip flexion to reduce foot drag x 6 trials  Stand in number square tapping number PT calls. CGA x 2 minutes     Leg press ; #150 leg press 15x ;2 sets VCs for eccentric return being slowed for better motor control and strengthening   Leg press; single leg, #75 15x each leg ; requires placement of feet for correct body mechanics     Ambulate 200 ft with CGA and mod verbal cueing for heel strike and hip flexion to reduce apsides of R foot drag.    Ambulate 200 ft with CGA and horizontal head turns with occasional LOB and mod to max verbal cueing for heel strike and hip flexion to reduce LE  dragging                         PT Education - 03/17/18 1637    Education provided  Yes    Education Details  exercise technique, object negotiation, gait mechanics with task    Person(s) Educated  Patient    Methods  Explanation;Demonstration;Verbal cues    Comprehension  Verbalized understanding;Returned demonstration       PT Short Term Goals - 02/01/18 1706      PT SHORT TERM GOAL #1   Title  Patient will be independent in home exercise program to improve strength/mobility for better functional independence with ADLs.    Baseline  HEP given     Time  2    Period  Weeks    Status  Partially Met      PT SHORT TERM GOAL #2   Title  Patient will tolerate 5 seconds of single leg stance without loss of balance to improve ability to get in and out of shower safely.    Baseline  3 seconds    Time  2    Period  Weeks    Status  Partially Met    Target Date  02/15/18      PT SHORT TERM GOAL #3   Title  Patient will increase six minute walk test distance to 600 ft for progression to community ambulator and improve gait ability    Baseline  441 ft; 6/5: 793    Time  2    Period  Weeks    Status  Achieved      PT SHORT TERM GOAL #4   Title  Patient will increase BLE gross strength to 4+/5 as to improve functional strength for independent gait, increased standing tolerance and increased ADL ability.    Baseline  4-/5; 6/5 4/5    Time  2    Period  Weeks    Status  Achieved        PT Long Term Goals - 02/01/18 1628      PT LONG TERM GOAL #1   Title  Patient will demonstrate an improved Berg Balance Score of >50/56 as to demonstrate improved balance with ADLs such as sitting/standing and transfer balance and reduced fall risk    Baseline  42/56; 6/5: 47/56 7/8: 51/56 seconds    Time  8    Period  Weeks    Status  Achieved      PT LONG TERM GOAL #2   Title  Patient will increase 10 meter walk test to >1.65ms as to improve gait speed for better  community ambulation and  to reduce fall risk    Baseline  .29ms with quad cane; 65: .83 m/s with no AD; 7/8: .91 m/s without AD 7/29: .77 m/s without AD     Time  8    Period  Weeks    Status  Partially Met    Target Date  03/29/18      PT LONG TERM GOAL #3   Title  Patient will be modified independent in walking on even/uneven surface with least restrictive assistive device, for 20+ minutes without rest break, reporting some difficulty or less to improve walking tolerance with community ambulation including grocery shopping, going to church,etc.     Baseline  4/23: unable to walk that distance; 6/5: challenged with 6 minutes on stable surface    Time  8    Period  Weeks    Status  Partially Met    Target Date  03/29/18      PT LONG TERM GOAL #4   Title  Patient will increase six minute walk test distance to >800 for progression to community ambulator and improve gait ability    Baseline  4/23: 441 ft 6/5: 793 with 3 episodes of LOB and frequent shuffling of LEs. ; 7/8: 821 ft    Time  8    Period  Weeks    Status  Achieved      PT LONG TERM GOAL #5   Title  Patient will perform SLS >5 seconds bilaterally to increase stability when getting in/out of shower    Baseline  7/8: 3 seconds    Time  8    Period  Weeks    Status  On-going    Target Date  03/29/18      PT LONG TERM GOAL #6   Title  Patient will increase six minute walk test distance to >1000 for progression to community ambulator and improve gait ability    Baseline  7/8: 821 ft 7/29: 725 ft without AD     Time  8    Period  Weeks    Status  On-going    Target Date  03/29/18            Plan - 03/17/18 1638    Clinical Impression Statement  Patient demonstrates improved ability to negotiate obstacles, however upon adding task to ambulation patient loses gait mechanics and reverts to foot drag. Patient coordination and space management improving with improved clearance of obstacles. Pt would benefit from  additional skilled PT intervention for improvements in strength, balance, and gait safety    Rehab Potential  Fair    Clinical Impairments Affecting Rehab Potential  (+) motivation, family support  (-) age, difficulty hearing/remembering    PT Frequency  2x / week    PT Duration  8 weeks    PT Treatment/Interventions  ADLs/Self Care Home Management;Electrical Stimulation;Therapeutic activities;Functional mobility training;Stair training;Gait training;DME Instruction;Therapeutic exercise;Balance training;Neuromuscular re-education;Patient/family education;Manual techniques;Passive range of motion;Energy conservation;Taping    PT Next Visit Plan  conitnue progression of ambulation, stairs, increase leg press resistance or consider single leg; add in dual tasking elements     PT Home Exercise Plan  see sheet    Consulted and Agree with Plan of Care  Patient       Patient will benefit from skilled therapeutic intervention in order to improve the following deficits and impairments:  Abnormal gait, Decreased activity tolerance, Decreased balance, Decreased knowledge of precautions, Decreased endurance, Decreased knowledge of use of DME, Decreased mobility, Decreased safety awareness,  Difficulty walking, Decreased strength, Improper body mechanics  Visit Diagnosis: Muscle weakness (generalized)  Other abnormalities of gait and mobility  Unsteadiness on feet     Problem List Patient Active Problem List   Diagnosis Date Noted  . Hypertensive urgency 12/31/2016  . Stroke ([place]) 10/15/2016  . TIA (transient ischemic attack) 03/07/2016  . Acute CVA (cerebrovascular accident) ([place]) 07/03/2015   Janna Arch, PT, DPT   03/17/2018, 4:51 PM  [place] MAIN [facility] SERVICES [address] [place], [place], [address] Phone: [phone]   Fax:  [phone]  Name: Mackinley Kiehn MRN: [id number] Date of Birth: 03-Feb-1926

## 2018-03-23 ENCOUNTER — Ambulatory Visit: Payer: Medicare Other

## 2018-03-23 DIAGNOSIS — R2689 Other abnormalities of gait and mobility: Secondary | ICD-10-CM

## 2018-03-23 DIAGNOSIS — M6281 Muscle weakness (generalized): Secondary | ICD-10-CM

## 2018-03-23 DIAGNOSIS — R2681 Unsteadiness on feet: Secondary | ICD-10-CM

## 2018-03-23 NOTE — Therapy (Signed)
[place] MAIN [facility] SERVICES [address] [place], [place], [address] Phone: [phone]   Fax:  [phone]  Physical Therapy Treatment  Patient Details  Name: Christopher [name] MRN: [id number] Date of Birth: 09-25-1925 Referring Provider: Dr. Chryl Heck   Encounter Date: 03/23/2018  PT End of Session - 03/23/18 1604    Visit Number  33    Number of Visits  40    Date for PT Re-Evaluation  03/29/18    Authorization Type  9/10   start 7/29    PT Start Time  1600    PT Stop Time  1645    PT Time Calculation (min)  45 min    Equipment Utilized During Treatment  Gait belt    Activity Tolerance  Patient tolerated treatment well    Behavior During Therapy  [facility] for tasks assessed/performed       Past Medical History:  Diagnosis Date  . Arthritis   . Ataxia   . Diabetes mellitus without complication ([place])    controlled, pt checks it in the morning   . Dyslipidemia   . Hypertension    somewhat controlled, pt checks every morning, he reports it has been good.  . Stroke ([place])   . TIA (transient ischemic attack)     Past Surgical History:  Procedure Laterality Date  . none      There were no vitals filed for this visit.  Subjective Assessment - 03/23/18 1603    Subjective  Patient presents to session with son. Reports no pain or falls since last session. Patient's son wants to see how he moves in session.     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 2017, and CVA Dec 2016. Was seen at this facility by a different therapist until 03/2017. Patient reports that he was doing his exercises at home when his nurse would come. Now he reports he does not have a nurse coming to his house and does not do his exercises.  Reports that he is not walking as fast now as he was able to before or for as long of distances.  Reports no falls.     Limitations  Walking;House hold activities;Other (comment)    How long can you sit comfortably?  n/a    How long can you stand comfortably?  n/a    How long can you walk comfortably?  across his house     Diagnostic tests  MR 6/27: IMPRESSION: No acute intracranial abnormality identified. Small chronic infarcts in basal ganglia, left paramedian pons,and right frontal subcortical white matter. Stable moderate chronic microvascular ischemic changes and mild parenchymal volume loss of the brain. Single interval punctate focus of microhemorrhage and left brachium pontis. Additional scattered stable chronic foci of microhemorrhage with central predominance, likely related to chronic hypertension.    Patient Stated Goals  Increase gait speed and walk for longer    Currently in Pain?  No/denies       Nustep Lvl 4 3 minutes RPM>60 no charge     Stoplight game: red to yellow, yellow to orange, orange to green: respectively: slow exaggerated, normal, and fast walking;stopping at each cone to practice termination and initiation of momentum/movement CGA. 6 attempts   Ambulate in hall with ball toss x 86 ft CGA, horizontal ball bas x 86 ft  Matrix : Weighted walk 3x each direction; lateral L and R, forward and backward 12.5lb ; CGA two episodes of LOB  Seated: LAQ 10x 5 second hold    Leg press ; #150 leg press 15x ;2 sets VCs for eccentric return being slowed for better motor control and strengthening   Leg press; single leg, #75 15x each leg ; requires placement of feet for correct body mechanics     Ambulate 200 ft with CGA and mod verbal cueing for heel strike and hip flexion to reduce epsiodes of R foot drag.                            PT Education - 03/23/18 1558    Education provided  Yes    Education Details  exercise technique, gait mechanics with task     Person(s) Educated  Patient    Methods  Explanation    Comprehension  Verbalized  understanding       PT Short Term Goals - 02/01/18 1706      PT SHORT TERM GOAL #1   Title  Patient will be independent in home exercise program to improve strength/mobility for better functional independence with ADLs.    Baseline  HEP given     Time  2    Period  Weeks    Status  Partially Met      PT SHORT TERM GOAL #2   Title  Patient will tolerate 5 seconds of single leg stance without loss of balance to improve ability to get in and out of shower safely.    Baseline  3 seconds    Time  2    Period  Weeks    Status  Partially Met    Target Date  02/15/18      PT SHORT TERM GOAL #3   Title  Patient will increase six minute walk test distance to 600 ft for progression to community ambulator and improve gait ability    Baseline  441 ft; 6/5: 793    Time  2    Period  Weeks    Status  Achieved      PT SHORT TERM GOAL #4   Title  Patient will increase BLE gross strength to 4+/5 as to improve functional strength for independent gait, increased standing tolerance and increased ADL ability.    Baseline  4-/5; 6/5 4/5    Time  2    Period  Weeks    Status  Achieved        PT Long Term Goals - 02/01/18 1628      PT LONG TERM GOAL #1   Title  Patient will demonstrate an improved Berg Balance Score of >50/56 as to demonstrate improved balance with ADLs such as sitting/standing and transfer balance and reduced fall risk    Baseline  42/56; 6/5: 47/56 7/8: 51/56 seconds    Time  8    Period  Weeks    Status  Achieved      PT LONG TERM GOAL #2   Title  Patient will increase 10 meter walk test to >1.99ms as to improve gait speed for better community ambulation and to reduce fall risk    Baseline  .259m with quad cane; 65: .83 m/s with no AD; 7/8: .91 m/s without AD 7/29: .77 m/s without AD     Time  8    Period  Weeks    Status  Partially Met    Target Date  03/29/18      PT LONG TERM GOAL #3   Title  Patient will be modified independent in walking on even/uneven surface  with least restrictive assistive device, for 20+ minutes without rest break, reporting some difficulty or less to improve walking tolerance with community ambulation including grocery shopping, going to [place].     Baseline  4/23: unable to walk that distance; 6/5: challenged with 6 minutes on stable surface    Time  8    Period  Weeks    Status  Partially Met    Target Date  03/29/18      PT LONG TERM GOAL #4   Title  Patient will increase six minute walk test distance to >800 for progression to community ambulator and improve gait ability    Baseline  4/23: 441 ft 6/5: 793 with 3 episodes of LOB and frequent shuffling of LEs. ; 7/8: 821 ft    Time  8    Period  Weeks    Status  Achieved      PT LONG TERM GOAL #5   Title  Patient will perform SLS >5 seconds bilaterally to increase stability when getting in/out of shower    Baseline  7/8: 3 seconds    Time  8    Period  Weeks    Status  On-going    Target Date  03/29/18      PT LONG TERM GOAL #6   Title  Patient will increase six minute walk test distance to >1000 for progression to community ambulator and improve gait ability    Baseline  7/8: 821 ft 7/29: 725 ft without AD     Time  8    Period  Weeks    Status  On-going    Target Date  03/29/18            Plan - 03/24/18 0745    Clinical Impression Statement  Patient and son verbalized understanding that next session may be last session if patient does not demonstrate gains in goal function. Son is happy with father's progress since his last [place]. Occasional foot drag continues to occur with fatigue however is corrected upon verbal cueing. Pt would benefit from additional skilled PT intervention for improvements in strength, balance, and gait safety    Rehab Potential  Fair    Clinical Impairments Affecting Rehab Potential  (+) motivation, family support  (-) age, difficulty hearing/remembering    PT Frequency  2x / week    PT Duration  8 weeks    PT  Treatment/Interventions  ADLs/Self Care Home Management;Electrical Stimulation;Therapeutic activities;Functional mobility training;Stair training;Gait training;DME Instruction;Therapeutic exercise;Balance training;Neuromuscular re-education;Patient/family education;Manual techniques;Passive range of motion;Energy conservation;Taping    PT Next Visit Plan  conitnue progression of ambulation, stairs, increase leg press resistance or consider single leg; add in dual tasking elements     PT Home Exercise Plan  see sheet    Consulted and Agree with Plan of Care  Patient       Patient will benefit from skilled therapeutic intervention in order to improve the following deficits and impairments:  Abnormal gait, Decreased activity tolerance, Decreased balance, Decreased knowledge of precautions, Decreased endurance, Decreased knowledge of use of DME, Decreased mobility, Decreased safety awareness, Difficulty walking, Decreased strength, Improper body mechanics  Visit Diagnosis: Muscle weakness (generalized)  Other abnormalities of gait and mobility  Unsteadiness on feet     Problem List Patient  Active Problem List   Diagnosis Date Noted  . Hypertensive urgency 12/31/2016  . Stroke ([place]) 10/15/2016  . TIA (transient ischemic attack) 03/07/2016  . Acute CVA (cerebrovascular accident) ([place]) 07/03/2015   Janna Arch, PT, DPT   03/24/2018, 7:46 AM  [place] MAIN [facility] SERVICES [address] [place], [place], [address] Phone: [phone]   Fax:  [phone]  Name: Chauncey Sciulli MRN: [id number] Date of Birth: 02/11/1926

## 2018-03-29 ENCOUNTER — Ambulatory Visit: Payer: Medicare Other

## 2018-03-29 DIAGNOSIS — R2689 Other abnormalities of gait and mobility: Secondary | ICD-10-CM

## 2018-03-29 DIAGNOSIS — M6281 Muscle weakness (generalized): Secondary | ICD-10-CM

## 2018-03-29 DIAGNOSIS — R2681 Unsteadiness on feet: Secondary | ICD-10-CM

## 2018-03-29 NOTE — Therapy (Signed)
[place] MAIN [facility] SERVICES [address] [place], [place], [address] Phone: [phone]   Fax:  [phone]  Physical Therapy Treatment/ DISCHARGE  Patient Details  Name: Christopher [name] MRN: [id number] Date of Birth: 06-17-1926 Referring Provider: Dr. Chryl Heck   Encounter Date: 03/29/2018  PT End of Session - 03/29/18 1650    Visit Number  34    Number of Visits  40    Date for PT Re-Evaluation  03/29/18    Authorization Type  10/10   start 7/29    PT Start Time  1600    PT Stop Time  1640    PT Time Calculation (min)  40 min    Equipment Utilized During Treatment  Gait belt    Activity Tolerance  Patient tolerated treatment well    Behavior During Therapy  [facility] for tasks assessed/performed       Past Medical History:  Diagnosis Date  . Arthritis   . Ataxia   . Diabetes mellitus without complication ([place])    controlled, pt checks it in the morning   . Dyslipidemia   . Hypertension    somewhat controlled, pt checks every morning, he reports it has been good.  . Stroke ([place])   . TIA (transient ischemic attack)     Past Surgical History:  Procedure Laterality Date  . none      There were no vitals filed for this visit.  Subjective Assessment - 03/29/18 1649    Subjective  Patient presents with his two sons and is aware that today is re-test day and that he will require increased scores to continue physical therapy. Patient reports no pain or LOB.     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 2017, and CVA Dec 2016. Was seen at this facility by a different therapist until 03/2017. Patient reports that he was doing his exercises at home when his nurse would come. Now he reports he does not have a nurse coming to his house and does not do his exercises.  Reports that he is not walking as fast now  as he was able to before or for as long of distances. Reports no falls.     Limitations  Walking;House hold activities;Other (comment)    How long can you sit comfortably?  n/a    How long can you stand comfortably?  n/a    How long can you walk comfortably?  across his house     Diagnostic tests  MR 6/27: IMPRESSION: No acute intracranial abnormality identified. Small chronic infarcts in basal ganglia, left paramedian pons,and right frontal subcortical white matter. Stable moderate chronic microvascular ischemic changes and mild parenchymal volume loss of the brain. Single interval punctate focus of microhemorrhage and left brachium pontis. Additional scattered stable chronic foci of microhemorrhage with central predominance, likely related to chronic hypertension.    Patient Stated Goals  Increase gait speed and walk for longer    Currently in Pain?  No/denies       Review goals,  10 MWT: 13 seconds =.77 m/s without AD  SLS = 3 seconds bilaterally  6 MWT: 798 ft with 4 episodes of LOB requiring PT assist to regain COM.  BERG= 49/56   Patient's status discussed with patient's two sons who were initially not pleased with plateau despite multiple  discussions in previous sessions. Required education on previous scores and current scores to explain plateau and need for discharge.    Reviewed need for continued HEP in home environment. Cueing for heel strike when ambulating in home environment.   [facility] PT Assessment - 03/29/18 0001      Berg Balance Test   Sit to Stand  Able to stand without using hands and stabilize independently    Standing Unsupported  Able to stand safely 2 minutes    Sitting with Back Unsupported but Feet Supported on Floor or Stool  Able to sit safely and securely 2 minutes    Stand to Sit  Sits safely with minimal use of hands    Transfers  Able to transfer safely, minor use of hands    Standing Unsupported with Eyes Closed  Able to stand 10 seconds safely    Standing  Ubsupported with Feet Together  Able to place feet together independently and stand for 1 minute with supervision    From Standing, Reach Forward with Outstretched Arm  Can reach forward >12 cm safely (5")    From Standing Position, Pick up Object from Floor  Able to pick up shoe safely and easily    From Standing Position, Turn to Look Behind Over each Shoulder  Looks behind from both sides and weight shifts well    Turn 360 Degrees  Able to turn 360 degrees safely in 4 seconds or less    Standing Unsupported, Alternately Place Feet on Step/Stool  Able to stand independently and complete 8 steps >20 seconds    Standing Unsupported, One Foot in Front  Able to take small step independently and hold 30 seconds    Standing on One Leg  Able to lift leg independently and hold equal to or more than 3 seconds    Total Score  49     refer over to silver sneakers due to plateau.                         PT Short Term Goals - 03/29/18 1648      PT SHORT TERM GOAL #1   Title  Patient will be independent in home exercise program to improve strength/mobility for better functional independence with ADLs.    Baseline  HEP given     Time  2    Period  Weeks    Status  Achieved      PT SHORT TERM GOAL #2   Title  Patient will tolerate 5 seconds of single leg stance without loss of balance to improve ability to get in and out of shower safely.    Baseline  3 seconds    Time  2    Period  Weeks    Status  Not Met      PT SHORT TERM GOAL #3   Title  Patient will increase six minute walk test distance to 600 ft for progression to community ambulator and improve gait ability    Baseline  441 ft; 6/5: 793    Time  2    Period  Weeks    Status  Achieved      PT SHORT TERM GOAL #4   Title  Patient will increase BLE gross strength to 4+/5 as to improve functional strength for independent gait, increased standing tolerance and increased ADL ability.    Baseline  4-/5; 6/5 4/5    Time   2    Period  Weeks    Status  Achieved        PT Long Term Goals - 03/29/18 1620      PT LONG TERM GOAL #1   Title  Patient will demonstrate an improved Berg Balance Score of >50/56 as to demonstrate improved balance with ADLs such as sitting/standing and transfer balance and reduced fall risk    Baseline  42/56; 6/5: 47/56 7/8: 51/56 seconds    Time  8    Period  Weeks    Status  Achieved      PT LONG TERM GOAL #2   Title  Patient will increase 10 meter walk test to >1.20ms as to improve gait speed for better community ambulation and to reduce fall risk    Baseline  .252m with quad cane; 65: .83 m/s with no AD; 7/8: .91 m/s without AD 7/29: .77 m/s without AD  9/23: .77 m/s    Time  8    Period  Weeks    Status  Not Met      PT LONG TERM GOAL #3   Title  Patient will be modified independent in walking on even/uneven surface with least restrictive assistive device, for 20+ minutes without rest break, reporting some difficulty or less to improve walking tolerance with community ambulation including grocery shopping, going to church,etc.     Baseline  4/23: unable to walk that distance; 6/5: challenged with 6 minutes on stable surface 9/23: challenged with 6 min walk test    Time  8    Period  Weeks    Status  Not Met      PT LONG TERM GOAL #4   Title  Patient will increase six minute walk test distance to >800 for progression to community ambulator and improve gait ability    Baseline  4/23: 441 ft 6/5: 793 with 3 episodes of LOB and frequent shuffling of LEs. ;     Time  8    Period  Weeks    Status  Achieved      PT LONG TERM GOAL #5   Title  Patient will perform SLS >5 seconds bilaterally to increase stability when getting in/out of shower    Baseline  7/8: 3 seconds 9/23: 3 seconds bilaterally     Time  8    Period  Weeks    Status  Not Met      PT LONG TERM GOAL #6   Title  Patient will increase six minute walk test distance to >1000 for progression to community  ambulator and improve gait ability    Baseline  7/8: 821 ft 7/29: 725 ft without AD ; 9/23: 7/8: 821 ft 6 MWT= 798 ft with 4 episodes of LOB     Time  8    Period  Weeks    Status  Not Met            Plan - 03/29/18 1650    Clinical Impression Statement  Patient's status discussed with patient's two sons who were initially not pleased with plateau despite multiple discussions in previous sessions. 10 MWT stayed the same at .7727mas well as ability to perform SLS (3 seconds bilaterally). BERG decreased to 49/56 and 6 MWT decreased to 798 ft with 4 episodes of LOB requiring PT assist to retain COM.  Patient will benefit from referral to silver sneakers program to continue fitness and prevent decline. I will be happy to see patient again in the future as needed.  Rehab Potential  Fair    Clinical Impairments Affecting Rehab Potential  (+) motivation, family support  (-) age, difficulty hearing/remembering    PT Frequency  2x / week    PT Duration  8 weeks    PT Treatment/Interventions  ADLs/Self Care Home Management;Electrical Stimulation;Therapeutic activities;Functional mobility training;Stair training;Gait training;DME Instruction;Therapeutic exercise;Balance training;Neuromuscular re-education;Patient/family education;Manual techniques;Passive range of motion;Energy conservation;Taping    PT Next Visit Plan  conitnue progression of ambulation, stairs, increase leg press resistance or consider single leg; add in dual tasking elements     PT Home Exercise Plan  see sheet    Consulted and Agree with Plan of Care  Patient       Patient will benefit from skilled therapeutic intervention in order to improve the following deficits and impairments:  Abnormal gait, Decreased activity tolerance, Decreased balance, Decreased knowledge of precautions, Decreased endurance, Decreased knowledge of use of DME, Decreased mobility, Decreased safety awareness, Difficulty walking, Decreased strength,  Improper body mechanics  Visit Diagnosis: Muscle weakness (generalized)  Other abnormalities of gait and mobility  Unsteadiness on feet     Problem List Patient Active Problem List   Diagnosis Date Noted  . Hypertensive urgency 12/31/2016  . Stroke ([place]) 10/15/2016  . TIA (transient ischemic attack) 03/07/2016  . Acute CVA (cerebrovascular accident) ([place]) 07/03/2015   Janna Arch, PT, DPT    03/29/2018, 4:56 PM  [place] MAIN [facility] SERVICES [address] [place], [place], [address] Phone: [phone]   Fax:  [phone]  Name: Christopher [name] MRN: [id number] Date of Birth: 1925-12-17

## 2018-04-01 ENCOUNTER — Ambulatory Visit: Payer: Medicare Other | Admitting: Physical Therapy

## 2018-04-06 ENCOUNTER — Ambulatory Visit: Payer: Medicare Other | Admitting: Physical Therapy

## 2018-04-08 ENCOUNTER — Ambulatory Visit: Payer: Medicare Other | Admitting: Physical Therapy

## 2018-04-12 ENCOUNTER — Ambulatory Visit: Payer: Medicare Other

## 2018-04-14 ENCOUNTER — Ambulatory Visit: Payer: Medicare Other | Admitting: Physical Therapy

## 2018-04-14 ENCOUNTER — Ambulatory Visit: Payer: Medicare Other

## 2018-04-16 ENCOUNTER — Emergency Department: Payer: Medicare Other

## 2018-04-16 ENCOUNTER — Emergency Department
Admission: EM | Admit: 2018-04-16 | Discharge: 2018-04-16 | Disposition: A | Discharge status: Home / Self Care | Payer: Medicare Other | Attending: Emergency Medicine | Admitting: Emergency Medicine

## 2018-04-16 ENCOUNTER — Other Ambulatory Visit: Payer: Self-pay

## 2018-04-16 DIAGNOSIS — F0390 Unspecified dementia without behavioral disturbance: Secondary | ICD-10-CM | POA: Diagnosis not present

## 2018-04-16 DIAGNOSIS — I10 Essential (primary) hypertension: Secondary | ICD-10-CM | POA: Diagnosis not present

## 2018-04-16 DIAGNOSIS — Z8673 Personal history of transient ischemic attack (TIA), and cerebral infarction without residual deficits: Secondary | ICD-10-CM | POA: Insufficient documentation

## 2018-04-16 DIAGNOSIS — E119 Type 2 diabetes mellitus without complications: Secondary | ICD-10-CM | POA: Insufficient documentation

## 2018-04-16 DIAGNOSIS — W19XXXA Unspecified fall, initial encounter: Secondary | ICD-10-CM | POA: Insufficient documentation

## 2018-04-16 DIAGNOSIS — S7002XA Contusion of left hip, initial encounter: Secondary | ICD-10-CM

## 2018-04-16 DIAGNOSIS — Y998 Other external cause status: Secondary | ICD-10-CM | POA: Diagnosis not present

## 2018-04-16 DIAGNOSIS — Y9389 Activity, other specified: Secondary | ICD-10-CM | POA: Insufficient documentation

## 2018-04-16 DIAGNOSIS — Z87891 Personal history of nicotine dependence: Secondary | ICD-10-CM | POA: Diagnosis not present

## 2018-04-16 DIAGNOSIS — Y92192 Bathroom in other specified residential institution as the place of occurrence of the external cause: Secondary | ICD-10-CM | POA: Diagnosis not present

## 2018-04-16 DIAGNOSIS — S79912A Unspecified injury of left hip, initial encounter: Secondary | ICD-10-CM | POA: Diagnosis present

## 2018-04-16 DIAGNOSIS — R42 Dizziness and giddiness: Secondary | ICD-10-CM | POA: Insufficient documentation

## 2018-04-16 LAB — URINALYSIS, COMPLETE (UACMP) WITH MICROSCOPIC
BACTERIA UA: NONE SEEN
BILIRUBIN URINE: NEGATIVE
Glucose, UA: 150 mg/dL — AB
Hgb urine dipstick: NEGATIVE
KETONES UR: 5 mg/dL — AB
LEUKOCYTES UA: NEGATIVE
NITRITE: NEGATIVE
PH: 5 (ref 5.0–8.0)
Protein, ur: 30 mg/dL — AB
SQUAMOUS EPITHELIAL / LPF: NONE SEEN (ref 0–5)
Specific Gravity, Urine: 1.014 (ref 1.005–1.030)

## 2018-04-16 LAB — CBC
HEMATOCRIT: 40.9 % (ref 39.0–52.0)
Hemoglobin: 13.6 g/dL (ref 13.0–17.0)
MCH: 28.3 pg (ref 26.0–34.0)
MCHC: 33.3 g/dL (ref 30.0–36.0)
MCV: 85.2 fL (ref 80.0–100.0)
NRBC: 0 % (ref 0.0–0.2)
Platelets: 216 10*3/uL (ref 150–400)
RBC: 4.8 MIL/uL (ref 4.22–5.81)
RDW: 13.2 % (ref 11.5–15.5)
WBC: 8.6 10*3/uL (ref 4.0–10.5)

## 2018-04-16 LAB — BASIC METABOLIC PANEL
Anion gap: 12 (ref 5–15)
BUN: 21 mg/dL (ref 8–23)
CALCIUM: 9.4 mg/dL (ref 8.9–10.3)
CO2: 25 mmol/L (ref 22–32)
CREATININE: 1.22 mg/dL (ref 0.61–1.24)
Chloride: 94 mmol/L — ABNORMAL LOW (ref 98–111)
GFR calc non Af Amer: 50 mL/min — ABNORMAL LOW (ref >60)
GFR, EST AFRICAN AMERICAN: 58 mL/min — AB (ref >60)
Glucose, Bld: 216 mg/dL — ABNORMAL HIGH (ref 70–99)
Potassium: 4.7 mmol/L (ref 3.5–5.1)
SODIUM: 131 mmol/L — AB (ref 135–145)

## 2018-04-16 LAB — GLUCOSE, CAPILLARY: GLUCOSE-CAPILLARY: 175 mg/dL — AB (ref 70–99)

## 2018-04-16 MED ORDER — TRAMADOL HCL 50 MG PO TABS: 50.0000 mg | ORAL_TABLET | Freq: Four times a day (QID) | ORAL | 0 refills | Status: AC | PRN

## 2018-04-16 MED ORDER — SODIUM CHLORIDE 0.9 % IV BOLUS
500.0000 mL | Freq: Once | INTRAVENOUS | Status: AC
  Administered 2018-04-16: 500 mL via INTRAVENOUS

## 2018-04-16 MED ORDER — DOCUSATE SODIUM 100 MG PO CAPS: 100.0000 mg | ORAL_CAPSULE | Freq: Every day | ORAL | 2 refills | Status: AC | PRN

## 2018-04-16 MED ORDER — MORPHINE SULFATE (PF) 2 MG/ML IV SOLN
2.0000 mg | Freq: Once | INTRAVENOUS | Status: AC
  Administered 2018-04-16: 2 mg via INTRAVENOUS
  Filled 2018-04-16: qty 1

## 2018-04-16 NOTE — ED Provider Notes (Signed)
[facility] Emergency Department Provider Note       Time seen: ----------------------------------------- 10:50 AM on 04/16/2018 -----------------------------------------   I have reviewed the triage vital signs and the nursing notes.  HISTORY   Chief Complaint Fall and Hip Pain    HPI Christopher [name] is a 84 y.o. male with a history of ataxia, diabetes, dyslipidemia, hypertension, TIA who presents to the ED for a fall.  Patient comes to the ER after a fall that occurred last night.  He landed on his left hip area and is complaining of diffuse pain to his left hip and left side.  He arrives alert and oriented.  Family states he fell going to the bathroom.  He does have a history of dementia.  Past Medical History:  Diagnosis Date  . Arthritis   . Ataxia   . Diabetes mellitus without complication ([facility])    controlled, pt checks it in the morning   . Dyslipidemia   . Hypertension    somewhat controlled, pt checks every morning, he reports it has been good.  . Stroke ([facility])   . TIA (transient ischemic attack)     Patient Active Problem List   Diagnosis Date Noted  . Hypertensive urgency 12/31/2016  . Stroke ([facility]) 10/15/2016  . TIA (transient ischemic attack) 03/07/2016  . Acute CVA (cerebrovascular accident) ([facility]) 07/03/2015    Past Surgical History:  Procedure Laterality Date  . none      Allergies Hydrochlorothiazide and Penicillins  Social History Social History   Tobacco Use  . Smoking status: Former Smoker    Types: Cigarettes  . Smokeless tobacco: Never Used  . Tobacco comment: quit 50-60 years ago  Substance Use Topics  . Alcohol use: No  . Drug use: No   Review of Systems Constitutional: Negative for fever. Cardiovascular: Negative for chest pain. Respiratory: Negative for shortness of breath. Gastrointestinal: Negative for abdominal pain, vomiting and diarrhea. Musculoskeletal: Positive for left hip pain Skin: Positive for  left hip contusion Neurological: Negative for headaches, focal weakness or numbness.  All systems negative/normal/unremarkable except as stated in the HPI  ____________________________________________   PHYSICAL EXAM:  VITAL SIGNS: ED Triage Vitals  Enc Vitals Group     BP 04/16/18 0903 (!) 141/68     Pulse Rate 04/16/18 0903 89     Resp 04/16/18 0903 20     Temp 04/16/18 0903 98.4 F (36.9 C)     Temp src --      SpO2 04/16/18 0903 98 %     Weight 04/16/18 0904 180 lb (81.6 kg)     Height 04/16/18 0904 5\' 6"  (1.676 m)     Head Circumference --      Peak Flow --      Pain Score 04/16/18 0914 10     Pain Loc --      Pain Edu? --      Excl. in GC? --    Constitutional: Alert, Well appearing and in no distress. Eyes: Conjunctivae are normal. Normal extraocular movements. ENT   Head: Normocephalic and atraumatic.   Nose: No congestion/rhinnorhea.   Mouth/Throat: Mucous membranes are moist.   Neck: No stridor. Cardiovascular: Normal rate, regular rhythm. No murmurs, rubs, or gallops. Respiratory: Normal respiratory effort without tachypnea nor retractions. Breath sounds are clear and equal bilaterally. No wheezes/rales/rhonchi. Gastrointestinal: Soft and nontender. Normal bowel sounds Musculoskeletal: Large left hip hematoma is noted.  Minimal pain with range of motion of the left hip, no focal  tenderness over the left ribs the patient describes pain in this area. Neurologic:  Normal speech and language. No gross focal neurologic deficits are appreciated.  Skin:  Skin is warm, dry and intact. No rash noted. Psychiatric: Mood and affect are normal. Speech and behavior are normal.  ____________________________________________  EKG: Interpreted by me.  Sinus rhythm rate 88 bpm, left anterior fascicular block, normal QT  ____________________________________________  ED COURSE:  As part of my medical decision making, I reviewed the following data within the  electronic MEDICAL RECORD NUMBER History obtained from family if available, nursing notes, old chart and ekg, as well as notes from prior ED visits. Patient presented for a fall, we will assess with labs and imaging as indicated at this time.   Procedures ____________________________________________   LABS (pertinent positives/negatives)  Labs Reviewed  GLUCOSE, CAPILLARY - Abnormal; Notable for the following components:      Result Value   Glucose-Capillary 175 (*)    All other components within normal limits  BASIC METABOLIC PANEL - Abnormal; Notable for the following components:   Sodium 131 (*)    Chloride 94 (*)    Glucose, Bld 216 (*)    GFR calc non Af Amer 50 (*)    GFR calc Af Amer 58 (*)    All other components within normal limits  URINALYSIS, COMPLETE (UACMP) WITH MICROSCOPIC - Abnormal; Notable for the following components:   Color, Urine YELLOW (*)    APPearance CLEAR (*)    Glucose, UA 150 (*)    Ketones, ur 5 (*)    Protein, ur 30 (*)    All other components within normal limits  CBC  CBG MONITORING, ED    RADIOLOGY Images were viewed by me  CT head, left hip x-rays, left rib x-rays, left shoulder x-rays IMPRESSION: No acute bony abnormality. IMPRESSION: No acute intracranial abnormality.  Atrophy, chronic microvascular disease.  Sphenoid and ethmoid sinusitis. IMPRESSION: Areas of scarring in the lungs bilaterally.  Mild cardiomegaly.  No acute cardiopulmonary disease. IMPRESSION: Degenerative changes in the left AC joint. No acute bony abnormality.  ____________________________________________  DIFFERENTIAL DIAGNOSIS   Fall, contusion, fracture, occult infection, dehydration, electrolyte abnormality  FINAL ASSESSMENT AND PLAN  Fall, left hip contusion   Plan: The patient had presented for a fall. Patient's labs were reassuring, no acute process was identified. Patient's imaging did not reveal any fracture, head injury or any other  acute process.  Patient was able to ambulate here with a walker, I have discussed case with family.  He is cleared for outpatient follow-up.   Ulice Dash, MD   Note: This note was generated in part or whole with voice recognition software. Voice recognition is usually quite accurate but there are transcription errors that can and very often do occur. I apologize for any typographical errors that were not detected and corrected.     Emily Filbert, MD 04/16/18 [phone]

## 2018-04-16 NOTE — ED Notes (Signed)
Patient ambulatory with hand holding.  Son states ambulation is baseline.  Patient normally walks with a walker.

## 2018-04-16 NOTE — ED Triage Notes (Signed)
First nurse note: pt fell prior to going to the bathroom, pt incontinent of urine in waiting room floor, pt has left hip pain, pt was ambulatory when ems arrived, fall was reported by family to have occurred last night. Pt has a hx of dementia, family is on the way. 158/78 bp 82 pulse, 98% RA per EMS

## 2018-04-16 NOTE — ED Triage Notes (Signed)
Pt comes into the ED via EMS from home with c/o having dizziness last night and falling. Pt is c/o left hip and back pain. Pt is a/ox4. Son is with the pt at present.,

## 2018-04-20 ENCOUNTER — Ambulatory Visit: Payer: Medicare Other

## 2018-04-22 ENCOUNTER — Ambulatory Visit: Payer: Medicare Other | Admitting: Physical Therapy

## 2018-04-26 ENCOUNTER — Ambulatory Visit: Payer: Medicare Other

## 2018-04-28 ENCOUNTER — Ambulatory Visit: Payer: Medicare Other | Admitting: Physical Therapy

## 2018-05-03 ENCOUNTER — Ambulatory Visit: Payer: Medicare Other

## 2018-05-05 ENCOUNTER — Ambulatory Visit: Payer: Medicare Other | Admitting: Physical Therapy

## 2019-01-24 ENCOUNTER — Other Ambulatory Visit: Payer: Self-pay | Admitting: Family Medicine

## 2019-01-24 DIAGNOSIS — Z8673 Personal history of transient ischemic attack (TIA), and cerebral infarction without residual deficits: Secondary | ICD-10-CM

## 2019-01-24 DIAGNOSIS — R600 Localized edema: Secondary | ICD-10-CM

## 2019-02-04 ENCOUNTER — Ambulatory Visit
Admission: RE | Admit: 2019-02-04 | Discharge: 2019-02-04 | Disposition: A | Discharge status: Home / Self Care | Payer: Medicare Other | Source: Ambulatory Visit | Attending: Family Medicine | Admitting: Family Medicine

## 2019-02-04 ENCOUNTER — Other Ambulatory Visit: Payer: Self-pay

## 2019-02-04 ENCOUNTER — Other Ambulatory Visit: Payer: Self-pay | Admitting: Family Medicine

## 2019-02-04 DIAGNOSIS — R600 Localized edema: Secondary | ICD-10-CM

## 2019-02-04 DIAGNOSIS — Z8673 Personal history of transient ischemic attack (TIA), and cerebral infarction without residual deficits: Secondary | ICD-10-CM | POA: Insufficient documentation

## 2019-02-04 LAB — POCT I-STAT CREATININE: Creatinine, Ser: 1.6 mg/dL — ABNORMAL HIGH (ref 0.61–1.24)

## 2019-02-04 MED ORDER — GADOBUTROL 1 MMOL/ML IV SOLN: 7.5000 mL | Freq: Once | INTRAVENOUS | Status: DC | PRN

## 2019-02-14 ENCOUNTER — Other Ambulatory Visit: Payer: Self-pay | Admitting: Nephrology

## 2019-02-14 DIAGNOSIS — R600 Localized edema: Secondary | ICD-10-CM

## 2019-02-14 DIAGNOSIS — N183 Chronic kidney disease, stage 3 unspecified: Secondary | ICD-10-CM

## 2019-02-16 ENCOUNTER — Ambulatory Visit
Admission: RE | Admit: 2019-02-16 | Discharge: 2019-02-16 | Disposition: A | Discharge status: Home / Self Care | Payer: Medicare Other | Source: Ambulatory Visit | Attending: Nephrology | Admitting: Nephrology

## 2019-02-16 ENCOUNTER — Other Ambulatory Visit: Payer: Self-pay

## 2019-02-16 DIAGNOSIS — I082 Rheumatic disorders of both aortic and tricuspid valves: Secondary | ICD-10-CM | POA: Insufficient documentation

## 2019-02-16 DIAGNOSIS — I10 Essential (primary) hypertension: Secondary | ICD-10-CM | POA: Diagnosis not present

## 2019-02-16 DIAGNOSIS — R600 Localized edema: Secondary | ICD-10-CM

## 2019-02-16 DIAGNOSIS — E119 Type 2 diabetes mellitus without complications: Secondary | ICD-10-CM | POA: Insufficient documentation

## 2019-02-16 NOTE — Progress Notes (Signed)
*  PRELIMINARY RESULTS* Echocardiogram 2D Echocardiogram has been performed.  Sherrie Sport 02/16/2019, 10:52 AM

## 2019-02-17 ENCOUNTER — Ambulatory Visit
Admission: RE | Admit: 2019-02-17 | Discharge: 2019-02-17 | Disposition: A | Discharge status: Home / Self Care | Payer: Medicare Other | Source: Ambulatory Visit | Attending: Nephrology | Admitting: Nephrology

## 2019-02-17 DIAGNOSIS — N183 Chronic kidney disease, stage 3 unspecified: Secondary | ICD-10-CM

## 2019-03-31 ENCOUNTER — Ambulatory Visit
Admission: RE | Admit: 2019-03-31 | Discharge: 2019-03-31 | Disposition: A | Discharge status: Home / Self Care | Payer: Medicare Other | Source: Ambulatory Visit | Attending: Family Medicine | Admitting: Family Medicine

## 2019-03-31 ENCOUNTER — Other Ambulatory Visit: Payer: Self-pay | Admitting: Family Medicine

## 2019-03-31 DIAGNOSIS — R52 Pain, unspecified: Secondary | ICD-10-CM | POA: Insufficient documentation

## 2019-05-11 ENCOUNTER — Ambulatory Visit: Payer: Medicare Other | Attending: Family Medicine

## 2019-05-11 ENCOUNTER — Other Ambulatory Visit: Payer: Self-pay

## 2019-05-11 DIAGNOSIS — R2689 Other abnormalities of gait and mobility: Secondary | ICD-10-CM | POA: Diagnosis not present

## 2019-05-11 DIAGNOSIS — R2681 Unsteadiness on feet: Secondary | ICD-10-CM | POA: Diagnosis present

## 2019-05-11 DIAGNOSIS — M6281 Muscle weakness (generalized): Secondary | ICD-10-CM | POA: Diagnosis present

## 2019-05-11 NOTE — Patient Instructions (Signed)
Access Code: [email or web address]  URL: [email or web address]  Date: 05/11/2019  Prepared by: Janna Arch   Exercises Seated March - 10 reps - 2 sets - 5 hold - 1x daily - 7x weekly Seated Heel Toe Raises - 10 reps - 2 sets - 5 hold - 1x daily - 7x weekly Seated Long Arc Quad - 10 reps - 2 sets - 5 hold - 1x daily - 7x weekly Side to Side Weight Shift with Counter Support - 10 reps - 2 sets - 5 hold - 1x daily - 7x weekly Standing March with Counter Support - 10 reps - 2 sets - 5 hold - 1x daily - 7x weekly

## 2019-05-11 NOTE — Therapy (Signed)
[facility] MAIN [facility] SERVICES [address] [place], [place], [address] Phone: [phone]   Fax:  [phone]  Physical Therapy Evaluation  Patient Details  Name: Christopher [name] MRN: [id number] Date of Birth: October 10, 1925 Referring Provider (PT): Dr. Hans Eden   Encounter Date: 05/11/2019  PT End of Session - 05/11/19 1742    Visit Number  1    Number of Visits  16    Date for PT Re-Evaluation  07/06/19    Authorization Type  1/10 eval 05/11/19    PT Start Time  1630    PT Stop Time  1717    PT Time Calculation (min)  47 min    Equipment Utilized During Treatment  Gait belt    Activity Tolerance  Patient tolerated treatment well;Other (comment)   comprehension   Behavior During Therapy  Flat affect;WFL for tasks assessed/performed       Past Medical History:  Diagnosis Date  . Arthritis   . Ataxia   . Diabetes mellitus without complication (HCC)    controlled, pt checks it in the morning   . Dyslipidemia   . Hypertension    somewhat controlled, pt checks every morning, he reports it has been good.  . Stroke (HCC)   . TIA (transient ischemic attack)     Past Surgical History:  Procedure Laterality Date  . none      There were no vitals filed for this visit.   Subjective Assessment - 05/11/19 1636    Subjective  Patient is a 85 year old male who presents for abnormal gait,    Patient is accompained by:  Family member   son is interpreter.   Pertinent History  Patient is a 85 year old male who presents for abnormal gait, worsening over the past 2-3 weeks. Patient's son present and states walking is hard and not walking much anymore.  He has been seen previously at this facility ~ 1 year ago and discharged due to plateau of progress. Since he was seen last patient has received home health PT per physician notes.  Recent MRI (02/04/19) shows minimal changes in brain since 2018 chronic lacunar infarcts, bilateral Anterior Cerebral  Artery hyper intensities, moderate white matter microvascular ischemic changes and atrophy of the brain. X ray of knee shows no acute findings with moderate medial compartment joint space narrowing and of hip showing no fracture, bone lesion or hip joint abnormality. Patient bathes and dresses self independently. 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 No falls reported. Want to use machines to get strength back per son. Patient is having less comprehension requiring son for translation.    Limitations  Lifting;Standing;Walking;House hold activities    How long can you sit comfortably?  n/a    How long can you stand comfortably?  as soon as stand.    How long can you walk comfortably?  across his house     Diagnostic tests  Recent MRI (02/04/19) shows minimal changes in brain since 2018 chronic lacunar infarcts, bilateral Anterior Cerebral Artery hyper intensities, moderate white matter microvascular ischemic changes and atrophy of the brain. X ray of knee shows no acute findings with moderate medial compartment joint space narrowing and of hip showing no fracture, bone lesion or hip joint abnormality.    Patient Stated Goals  get some strength back and walk better.    Currently in Pain?  Yes    Pain Score  6  Pain Location  Leg    Pain Orientation  Right    Pain Descriptors / Indicators  Aching    Pain Type  Chronic pain    Pain Radiating Towards  hip and knee    Pain Onset  More than a month ago    Pain Frequency  Constant    Aggravating Factors   worsened with weightbearing    Pain Relieving Factors  pain medication          [facility] PT Assessment - 05/11/19 0001      Assessment   Medical Diagnosis  abnormal gait    Referring Provider (PT)  Dr. Hans Eden    Onset Date/Surgical Date  --   3-4 months ago    Hand Dominance  Right    Prior Therapy  yes, last year      Precautions   Precautions  None    Required Braces or Orthoses  --   none     Restrictions    Weight Bearing Restrictions  No      Balance Screen   Has the patient fallen in the past 6 months  No    Has the patient had a decrease in activity level because of a fear of falling?   Yes    Is the patient reluctant to leave their home because of a fear of falling?   Yes      Home Environment   Living Environment  Private residence    Living Arrangements  Children    Available Help at Discharge  Family    Type of Home  House    Home Access  Ramped entrance    Home Layout  Two level    Alternate Level Stairs-Number of Steps  7    Alternate Level Stairs-Rails  Right    Home Equipment  Walker - 2 wheels;Cane - quad      Prior Function   Level of Independence  Independent with basic ADLs      Cognition   Overall Cognitive Status  Difficult to assess    Difficult to assess due to  Hard of hearing/deaf;Non-English speaking   requires use of son for translator      Sensation   Light Touch  Appears Intact      Standardized Balance Assessment   Standardized Balance Assessment  Berg Balance Test      Berg Balance Test   Sit to Stand  Able to stand  independently using hands    Standing Unsupported  Able to stand 2 minutes with supervision    Sitting with Back Unsupported but Feet Supported on Floor or Stool  Able to sit safely and securely 2 minutes    Stand to Sit  Controls descent by using hands    Transfers  Able to transfer safely, minor use of hands    Standing Unsupported with Eyes Closed  Able to stand 10 seconds with supervision    Standing Unsupported with Feet Together  Needs help to attain position but able to stand for 30 seconds with feet together    From Standing, Reach Forward with Outstretched Arm  Can reach forward >12 cm safely (5")    From Standing Position, Pick up Object from Floor  Able to pick up shoe, needs supervision    From Standing Position, Turn to Look Behind Over each Shoulder  Looks behind one side only/other side shows less weight shift    Turn 360  Degrees  Needs close supervision  or verbal cueing    Standing Unsupported, Alternately Place Feet on Step/Stool  Able to complete >2 steps/needs minimal assist    Standing Unsupported, One Foot in Front  Needs help to step but can hold 15 seconds    Standing on One Leg  Tries to lift leg/unable to hold 3 seconds but remains standing independently    Total Score  34         PAIN: R leg: 5-6/10  Reports pain is relieved with pain medicine.   POSTURE: Seated: weight shift onto LLE Standing: trunk flexion with weight shift onto LLE   STRENGTH:  Graded on a 0-5 scale Muscle Group Left Right  Hip Flex 4-/5 3+/5  Hip Abd 4-/5 4-/5  Hip Add 3+/5 3/5  Hip Ext 3+/5 3/5  Hip IR/ER    Knee Flex 4-/5 3+/5  Knee Ext 4-/5 3+/5  Ankle DF 4-/5 3+/5  Ankle PF 4-/5 3+/5    SPECIAL TESTS: Coordination: finger to nose test: poor coordination bilaterally with dysmetria Coordination: shin heel test: poor coordination with RLE, WFL LLE  FUNCTIONAL MOBILITY: STS: posterior LOB with no UE support.  SPT: reach with LUE, challenging to weight shift.   BALANCE: Dynamic Sitting Balance  Normal Able to sit unsupported and weight shift across midline maximally   Good Able to sit unsupported and weight shift across midline moderately   Good-/Fair+ Able to sit unsupported and weight shift across midline minimally x  Fair Minimal weight shifting ipsilateral/front, difficulty crossing midline   Fair- Reach to ipsilateral side and unable to weight shift   Poor + Able to sit unsupported with min A and reach to ipsilateral side, unable to weight shift   Poor Able to sit unsupported with mod A and reach ipsilateral/front-can't cross midline     Standing Dynamic Balance  Normal Stand independently unsupported, able to weight shift and cross midline maximally   Good Stand independently unsupported, able to weight shift and cross midline moderately   Good-/Fair+ Stand independently unsupported, able to  weight shift across midline minimally   Fair Stand independently unsupported, weight shift, and reach ipsilaterally, loss of balance when crossing midline x  Poor+ Able to stand with Min A and reach ipsilaterally, unable to weight shift   Poor Able to stand with Mod A and minimally reach ipsilaterally, unable to cross midline.     Static Sitting Balance  Normal Able to maintain balance against maximal resistance   Good Able to maintain balance against moderate resistance   Good-/Fair+ Accepts minimal resistance x  Fair Able to sit unsupported without balance loss and without UE support   Poor+ Able to maintain with Minimal assistance from individual or chair   Poor Unable to maintain balance-requires mod/max support from individual or chair     Static Standing Balance  Normal Able to maintain standing balance against maximal resistance   Good Able to maintain standing balance against moderate resistance   Good-/Fair+ Able to maintain standing balance against minimal resistance   Fair Able to stand unsupported without UE support and without LOB for 1-2 min x  Fair- Requires Min A and UE support to maintain standing without loss of balance   Poor+ Requires mod A and UE support to maintain standing without loss of balance   Poor Requires max A and UE support to maintain standing balance without loss       GAIT: Patient ambulates with quad cane in LUE, short steps bilaterally with poor weight shift, limited  foot clearance of RLE results in shuffle stepping.   OUTCOME MEASURES: TEST Outcome Interpretation  5 times sit<>stand 17 sec no UE support, occasional posterior LOB >60 yo, >15 sec indicates increased risk for falls  10 meter walk test          32 seconds with quad cane=0.31        m/s <1.0 m/s indicates increased risk for falls; limited community ambulator          [employer] Assessment 34/56 <36/56 (100% risk for falls), 37-45 (80% risk for falls); 46-51 (>50% risk for falls);  52-55 (lower risk <25% of falls)            Access Code: [email or web address]  URL: [email or web address]  Date: 05/11/2019  Prepared by: Precious Bard   Exercises Seated March - 10 reps - 2 sets - 5 hold - 1x daily - 7x weekly Seated Heel Toe Raises - 10 reps - 2 sets - 5 hold - 1x daily - 7x weekly Seated Long Arc Quad - 10 reps - 2 sets - 5 hold - 1x daily - 7x weekly Side to Side Weight Shift with Counter Support - 10 reps - 2 sets - 5 hold - 1x daily - 7x weekly Standing March with Counter Support - 10 reps - 2 sets - 5 hold - 1x daily - 7x weekly     Objective measurements completed on examination: See above findings.              PT Education - 05/11/19 1742    Education provided  Yes    Education Details  HEP, goals, POC    Person(s) Educated  Patient;Child(ren)    Methods  Explanation;Demonstration;Tactile cues;Verbal cues    Comprehension  Verbalized understanding;Returned demonstration;Verbal cues required;Tactile cues required;Need further instruction       PT Short Term Goals - 05/11/19 1751      PT SHORT TERM GOAL #1   Title  Patient will be independent in home exercise program to improve strength/mobility for better functional independence with ADLs.    Baseline  11/4: HEP given    Time  4    Period  Weeks    Status  New    Target Date  06/08/19        PT Long Term Goals - 05/11/19 1752      PT LONG TERM GOAL #1   Title  Patient will demonstrate an improved Berg Balance Score of >40/56 as to demonstrate improved balance with ADLs such as sitting/standing and transfer balance and reduced fall risk    Baseline  11/4: 34/56    Time  8    Period  Weeks    Status  New    Target Date  07/06/19      PT LONG TERM GOAL #2   Title  Patient will increase 10 meter walk test to >1.61m/s as to improve gait speed for better community ambulation and to reduce fall risk    Baseline  11/4: 0.31 m/s with quad cane    Time  8    Period  Weeks     Status  New    Target Date  07/06/19      PT LONG TERM GOAL #3   Title  Patient will increase BLE gross strength to 4+/5 as to improve functional strength for independent gait, increased standing tolerance and increased ADL ability.    Baseline  11/4: L: grossly 4-/5 R grossly 3+/5 with hip add/ext 3/5  Time  8    Period  Weeks    Status  New    Target Date  07/06/19      PT LONG TERM GOAL #4   Title  Patient will increase six minute walk test distance to >800 for progression to community ambulator and improve gait ability    Baseline  11/4: unable to perform    Time  8    Period  Weeks    Status  New    Target Date  07/06/19             Plan - 05/11/19 1749    Clinical Impression Statement  Patient is a 85 year old male who presents to physical therapy for abnormal gait. Son present as interpreter for patient as both son and patient refuse hospital interpreter. Patient has noticeable deficits of RLE in regards to strength, coordination, and mobility. He presents with decreased mobility and balance since seen last in this clinic ~ 1 year ago. Patient and son educated on Hep and need for compliance daily for progression as well as need for son to be present at every session.Pt would benefit from skilled PT intervention for improvements in strength, balance, and gait safety    Personal Factors and Comorbidities  Age;Comorbidity 3+;Education;Past/Current Experience;Social Background;Time since onset of injury/illness/exacerbation    Comorbidities  Stroke, TIA, CVA, arthritis, dyslipedemia, HTN, DM, ataxia, needs help with interpreter    Examination-Activity Limitations  Hygiene/Grooming;Locomotion Level;Reach Overhead;Squat;Stairs;Stand;Transfers    Examination-Participation Restrictions  Cleaning;Community Activity;Laundry;Shop;Meal Prep;Yard Work    Conservation officer, historic buildingstability/Clinical Decision Making  Evolving/Moderate complexity    Clinical Decision Making  Moderate    Rehab Potential  Fair     PT Frequency  2x / week    PT Duration  8 weeks    PT Treatment/Interventions  ADLs/Self Care Home Management;Electrical Stimulation;Therapeutic activities;Functional mobility training;Stair training;Gait training;DME Instruction;Therapeutic exercise;Balance training;Neuromuscular re-education;Patient/family education;Manual techniques;Passive range of motion;Energy conservation;Taping;Aquatic Therapy;Biofeedback;Cryotherapy;Moist Heat;Traction;Ultrasound;Orthotic Fit/Training    PT Next Visit Plan  balance, strength    PT Home Exercise Plan  see above    Consulted and Agree with Plan of Care  Patient;Family member/caregiver    Family Member Consulted  son       Patient will benefit from skilled therapeutic intervention in order to improve the following deficits and impairments:  Abnormal gait, Decreased activity tolerance, Decreased balance, Decreased knowledge of precautions, Decreased endurance, Decreased knowledge of use of DME, Decreased mobility, Decreased safety awareness, Difficulty walking, Decreased strength, Improper body mechanics, Decreased coordination, Impaired perceived functional ability, Impaired flexibility, Impaired vision/preception, Postural dysfunction, Pain  Visit Diagnosis: Other abnormalities of gait and mobility  Muscle weakness (generalized)  Unsteadiness on feet     Problem List Patient Active Problem List   Diagnosis Date Noted  . Hypertensive urgency 12/31/2016  . Stroke (HCC) 10/15/2016  . TIA (transient ischemic attack) 03/07/2016  . Acute CVA (cerebrovascular accident) (HCC) 07/03/2015   Precious BardMarina Timothy Townsel, PT, DPT   05/11/2019, 5:55 PM  [facility] [address] [place], [place], [id number] Phone: [phone]   Fax:  [phone]  Name: Christopher [name] MRN: [id number] Date of Birth: 30-Nov-1925

## 2019-05-12 ENCOUNTER — Other Ambulatory Visit: Payer: Self-pay

## 2019-05-12 ENCOUNTER — Ambulatory Visit: Payer: Medicare Other

## 2019-05-12 DIAGNOSIS — R2689 Other abnormalities of gait and mobility: Secondary | ICD-10-CM | POA: Diagnosis not present

## 2019-05-12 DIAGNOSIS — M6281 Muscle weakness (generalized): Secondary | ICD-10-CM

## 2019-05-12 DIAGNOSIS — R2681 Unsteadiness on feet: Secondary | ICD-10-CM

## 2019-05-12 NOTE — Therapy (Signed)
[facility] [address] [place], [place], [address] Phone: [phone]   Fax:  [phone]  Physical Therapy Treatment  Patient Details  Name: Christopher [name] MRN: [id number] Date of Birth: October 22, 1925 Referring Provider (PT): Dr. Hans EdenNgwe Ayacock   Encounter Date: 05/12/2019  PT End of Session - 05/12/19 1707    Visit Number  2    Number of Visits  16    Date for PT Re-Evaluation  07/06/19    Authorization Type  2/10 eval 05/11/19    PT Start Time  1600    PT Stop Time  1644    PT Time Calculation (min)  44 min    Equipment Utilized During Treatment  Gait belt    Activity Tolerance  Patient tolerated treatment well;Other (comment)   comprehension   Behavior During Therapy  Flat affect;WFL for tasks assessed/performed       Past Medical History:  Diagnosis Date  . Arthritis   . Ataxia   . Diabetes mellitus without complication (HCC)    controlled, pt checks it in the morning   . Dyslipidemia   . Hypertension    somewhat controlled, pt checks every morning, he reports it has been good.  . Stroke (HCC)   . TIA (transient ischemic attack)     Past Surgical History:  Procedure Laterality Date  . none      There were no vitals filed for this visit.  Subjective Assessment - 05/12/19 1604    Subjective  Patient presents with son for first PT session s/p eval yesterday. No falls or LOB since last session. Brought walker in for sizing.    Patient is accompained by:  Family member   son is interpreter.   Pertinent History  Patient is a 85 year old male who presents for abnormal gait, worsening over the past 2-3 weeks. Patient's son present and states walking is hard and not walking much anymore.  He has been seen previously at this facility ~ 1 year ago and discharged due to plateau of progress. Since he was seen last patient has received home health PT per physician notes.  Recent MRI (02/04/19) shows minimal changes in brain  since 2018 chronic lacunar infarcts, bilateral Anterior Cerebral Artery hyper intensities, moderate white matter microvascular ischemic changes and atrophy of the brain. X ray of knee shows no acute findings with moderate medial compartment joint space narrowing and of hip showing no fracture, bone lesion or hip joint abnormality. Patient bathes and dresses self independently. 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 No falls reported. Want to use machines to get strength back per son. Patient is having less comprehension requiring son for translation.    Limitations  Lifting;Standing;Walking;House hold activities    How long can you sit comfortably?  n/a    How long can you stand comfortably?  as soon as stand.    How long can you walk comfortably?  across his house     Diagnostic tests  Recent MRI (02/04/19) shows minimal changes in brain since 2018 chronic lacunar infarcts, bilateral Anterior Cerebral Artery hyper intensities, moderate white matter microvascular ischemic changes and atrophy of the brain. X ray of knee shows no acute findings with moderate medial compartment joint space narrowing and of hip showing no fracture, bone lesion or hip joint abnormality.    Patient Stated Goals  get some strength back and walk better.    Currently in Pain?  Yes  Pain Score  5     Pain Location  Leg    Pain Orientation  Right    Pain Descriptors / Indicators  Aching    Pain Type  Chronic pain    Pain Onset  More than a month ago    Pain Frequency  Constant           Nustep Lvl 3 Rpm > 60 for cardiovascular and musculoskeletal challenge. 3 minutes    Quantum leg press bilateral,#100 lb 10x 2 sets; second set #115 lb cueing for foot alignment and speed of muscle activation/sequencing. Cueing to not straighten LLE fully to protect L knee   Quantum leg press single LE #45 lb 10x 2 sets; second set #60  each LE cueing for foot alignment and speed of muscle activation/sequencing.    in // bars:Patient requires CGA for all standing interventions  Speed ladder: one foot in each square decreasing from BUE support to single UE support. Cueing for widening step length for improved gait mechanics/foot clearance. 10x length of // bars. Ambulate with QC outside of // bars with slight improvement in RLE foot clearance and step length.   Large step from one line to another in // bars, large step then clap, 12x each LE, max cueing for sequencing.   Orange hurdle: BUE support with cueing for step over for improved hip/foot clearance bilaterally  Seated 6" step toe taps for coordination,spatial awareness, muscle activation sequencing/timing, and strengthening 2x 30 seconds.    Pt educated throughout session about proper posture and technique with exercises. Improved exercise technique, movement at target joints, use of target muscles after min to mod verbal, visual, tactile cues.           PT Education - 05/12/19 1703    Education provided  Yes    Education Details  exercise technique, improving gait mechanics/foot clearance    Person(s) Educated  Patient    Methods  Explanation;Demonstration;Tactile cues;Verbal cues    Comprehension  Verbalized understanding;Returned demonstration;Verbal cues required;Tactile cues required       PT Short Term Goals - 05/11/19 1751      PT SHORT TERM GOAL #1   Title  Patient will be independent in home exercise program to improve strength/mobility for better functional independence with ADLs.    Baseline  11/4: HEP given    Time  4    Period  Weeks    Status  New    Target Date  06/08/19        PT Long Term Goals - 05/11/19 1752      PT LONG TERM GOAL #1   Title  Patient will demonstrate an improved Berg Balance Score of >40/56 as to demonstrate improved balance with ADLs such as sitting/standing and transfer balance and reduced fall risk    Baseline  11/4: 34/56    Time  8    Period  Weeks    Status  New    Target Date   07/06/19      PT LONG TERM GOAL #2   Title  Patient will increase 10 meter walk test to >1.52m/s as to improve gait speed for better community ambulation and to reduce fall risk    Baseline  11/4: 0.31 m/s with quad cane    Time  8    Period  Weeks    Status  New    Target Date  07/06/19      PT LONG TERM GOAL #3   Title  Patient will increase BLE gross strength to 4+/5 as to improve functional strength for independent gait, increased standing tolerance and increased ADL ability.    Baseline  11/4: L: grossly 4-/5 R grossly 3+/5 with hip add/ext 3/5    Time  8    Period  Weeks    Status  New    Target Date  07/06/19      PT LONG TERM GOAL #4   Title  Patient will increase six minute walk test distance to >800 for progression to community ambulator and improve gait ability    Baseline  11/4: unable to perform    Time  8    Period  Weeks    Status  New    Target Date  07/06/19            Plan - 05/12/19 1712    Clinical Impression Statement  Patient presents with son for session today. He is challenged with RLE strength and foot clearance with consistent shuffle of RLE with mobility. Carryover from interventions in // bars to ambulation outside // bars has small immediate carryover noted immediatly however did fade as patient fatigued. Pt would benefit from skilled PT intervention for improvements in strength, balance, and gait safety    Personal Factors and Comorbidities  Age;Comorbidity 3+;Education;Past/Current Experience;Social Background;Time since onset of injury/illness/exacerbation    Comorbidities  Stroke, TIA, CVA, arthritis, dyslipedemia, HTN, DM, ataxia, needs help with interpreter    Examination-Activity Limitations  Hygiene/Grooming;Locomotion Level;Reach Overhead;Squat;Stairs;Stand;Transfers    Examination-Participation Restrictions  Cleaning;Community Activity;Laundry;Shop;Meal Prep;Yard Work    Conservation officer, historic buildings  Evolving/Moderate complexity     Rehab Potential  Fair    PT Frequency  2x / week    PT Duration  8 weeks    PT Treatment/Interventions  ADLs/Self Care Home Management;Electrical Stimulation;Therapeutic activities;Functional mobility training;Stair training;Gait training;DME Instruction;Therapeutic exercise;Balance training;Neuromuscular re-education;Patient/family education;Manual techniques;Passive range of motion;Energy conservation;Taping;Aquatic Therapy;Biofeedback;Cryotherapy;Moist Heat;Traction;Ultrasound;Orthotic Fit/Training    PT Next Visit Plan  balance, strength    PT Home Exercise Plan  see above    Consulted and Agree with Plan of Care  Patient;Family member/caregiver    Family Member Consulted  son       Patient will benefit from skilled therapeutic intervention in order to improve the following deficits and impairments:  Abnormal gait, Decreased activity tolerance, Decreased balance, Decreased knowledge of precautions, Decreased endurance, Decreased knowledge of use of DME, Decreased mobility, Decreased safety awareness, Difficulty walking, Decreased strength, Improper body mechanics, Decreased coordination, Impaired perceived functional ability, Impaired flexibility, Impaired vision/preception, Postural dysfunction, Pain  Visit Diagnosis: Other abnormalities of gait and mobility  Muscle weakness (generalized)  Unsteadiness on feet     Problem List Patient Active Problem List   Diagnosis Date Noted  . Hypertensive urgency 12/31/2016  . Stroke (HCC) 10/15/2016  . TIA (transient ischemic attack) 03/07/2016  . Acute CVA (cerebrovascular accident) (HCC) 07/03/2015   Precious Bard, PT, DPT   05/12/2019, 5:14 PM  [facility] MAIN [facility] SERVICES [address] [place], [place], [address] Phone: [phone]   Fax:  [phone]  Name: Christopher [name] MRN: [id number] Date of Birth: 1926/01/25

## 2019-05-18 ENCOUNTER — Other Ambulatory Visit: Payer: Self-pay

## 2019-05-18 ENCOUNTER — Ambulatory Visit: Payer: Medicare Other

## 2019-05-18 DIAGNOSIS — R2681 Unsteadiness on feet: Secondary | ICD-10-CM

## 2019-05-18 DIAGNOSIS — M6281 Muscle weakness (generalized): Secondary | ICD-10-CM

## 2019-05-18 DIAGNOSIS — R2689 Other abnormalities of gait and mobility: Secondary | ICD-10-CM

## 2019-05-18 NOTE — Therapy (Signed)
[place] MAIN [facility] SERVICES [address] [place], [place], [address] Phone: [phone]   Fax:  [phone]  Physical Therapy Treatment  Patient Details  Name: Christopher [name] MRN: [id number] Date of Birth: 30-Jun-1926 Referring Provider (PT): Dr. Chryl Heck   Encounter Date: 05/18/2019  PT End of Session - 05/18/19 1610    Visit Number  3    Number of Visits  16    Date for PT Re-Evaluation  07/06/19    Authorization Type  3/10 eval 05/11/19    PT Start Time  6962    PT Stop Time  1645    PT Time Calculation (min)  41 min    Equipment Utilized During Treatment  Gait belt    Activity Tolerance  Patient tolerated treatment well;Other (comment)   comprehension   Behavior During Therapy  Flat affect;WFL for tasks assessed/performed       Past Medical History:  Diagnosis Date  . Arthritis   . Ataxia   . Diabetes mellitus without complication ([place])    controlled, pt checks it in the morning   . Dyslipidemia   . Hypertension    somewhat controlled, pt checks every morning, he reports it has been good.  . Stroke ([place])   . TIA (transient ischemic attack)     Past Surgical History:  Procedure Laterality Date  . none      There were no vitals filed for this visit.  Subjective Assessment - 05/18/19 1609    Subjective  Patient presents with son, reports no stumbles or falls since last session. Has been compliant with HEP.    Patient is accompained by:  Family member   son is interpreter.   Pertinent History  Patient is a 85 year old male who presents for abnormal gait, worsening over the past 2-3 weeks. Patient's son present and states walking is hard and not walking much anymore.  He has been seen previously at this facility ~ 1 year ago and discharged due to plateau of progress. Since he was seen last patient has received home health PT per physician notes.  Recent MRI (02/04/19) shows minimal changes in brain since 2018 chronic lacunar  infarcts, bilateral Anterior Cerebral Artery hyper intensities, moderate white matter microvascular ischemic changes and atrophy of the brain. X ray of knee shows no acute findings with moderate medial compartment joint space narrowing and of hip showing no fracture, bone lesion or hip joint abnormality. Patient bathes and dresses self independently. 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 No falls reported. Want to use machines to get strength back per son. Patient is having less comprehension requiring son for translation.    Limitations  Lifting;Standing;Walking;House hold activities    How long can you sit comfortably?  n/a    How long can you stand comfortably?  as soon as stand.    How long can you walk comfortably?  across his house     Diagnostic tests  Recent MRI (02/04/19) shows minimal changes in brain since 2018 chronic lacunar infarcts, bilateral Anterior Cerebral Artery hyper intensities, moderate white matter microvascular ischemic changes and atrophy of the brain. X ray of knee shows no acute findings with moderate medial compartment joint space narrowing and of hip showing no fracture, bone lesion or hip joint abnormality.    Patient Stated Goals  get some strength back and walk better.    Currently in Pain?  Yes    Pain Score  5     Pain Location  Leg    Pain Orientation  Right    Pain Descriptors / Indicators  Aching    Pain Onset  More than a month ago    Pain Frequency  Constant         Nustep Lvl 4 Rpm > 60 for cardiovascular and musculoskeletal challenge. 3 minutes     airex pad: one foot on step one foot on airex pad 2x 45 second holds cueing for upright posture each LE   Airex pad: Stand with SUE support tapping 7" step with cueing for foot clearance; 15x each LE   Airex pad: side step toe taps on 7" step with cueing for foot clearance. 15x each LE  Large step from over theraband , 15x each LE, max cueing for sequencing. Cueing for keeping quad cane  in front for stability, Min A for stabilization of COM .    4lb ankle weights: -standing marches with quad cane support 12x each LE -seated LAQ 10x 3 second holds   -seated marches with upright posture and arms crossed 15x each LE; max cueing for upright position  GTB hamstring curl 15x each LE  Ambulate 60 ft with quad cane x 3 trials with min A for weight shift and tactile cueing for step. Decreased foot clearance with fatigue     Pt educated throughout session about proper posture and technique with exercises. Improved exercise technique, movement at target joints, use of target muscles after min to mod verbal, visual, tactile cues.   Patient is challenged with RLE foot clearance with limited stability of LLE on stable and unstable surfaces. Patient requires max cueing from PT and son verbally and visually with demonstration. Improved foot clearance with repetition noted with tactile cueing for weight shift however patient is challenged with fatigue. Pt would benefit from skilled PT intervention for improvements in strength, balance, and gait safety                    PT Education - 05/18/19 1610    Education provided  Yes    Education Details  exercise technique, body mechanics, foot clearance    Person(s) Educated  Patient    Methods  Explanation;Demonstration;Tactile cues;Verbal cues    Comprehension  Verbalized understanding;Verbal cues required;Returned demonstration;Tactile cues required       PT Short Term Goals - 05/11/19 1751      PT SHORT TERM GOAL #1   Title  Patient will be independent in home exercise program to improve strength/mobility for better functional independence with ADLs.    Baseline  11/4: HEP given    Time  4    Period  Weeks    Status  New    Target Date  06/08/19        PT Long Term Goals - 05/11/19 1752      PT LONG TERM GOAL #1   Title  Patient will demonstrate an improved Berg Balance Score of >40/56 as to demonstrate  improved balance with ADLs such as sitting/standing and transfer balance and reduced fall risk    Baseline  11/4: 34/56    Time  8    Period  Weeks    Status  New    Target Date  07/06/19      PT LONG TERM GOAL #2   Title  Patient will increase 10 meter walk test to >1.6132m/s as to improve gait speed for better community ambulation and to reduce fall  risk    Baseline  11/4: 0.31 m/s with quad cane    Time  8    Period  Weeks    Status  New    Target Date  07/06/19      PT LONG TERM GOAL #3   Title  Patient will increase BLE gross strength to 4+/5 as to improve functional strength for independent gait, increased standing tolerance and increased ADL ability.    Baseline  11/4: L: grossly 4-/5 R grossly 3+/5 with hip add/ext 3/5    Time  8    Period  Weeks    Status  New    Target Date  07/06/19      PT LONG TERM GOAL #4   Title  Patient will increase six minute walk test distance to >800 for progression to community ambulator and improve gait ability    Baseline  11/4: unable to perform    Time  8    Period  Weeks    Status  New    Target Date  07/06/19            Plan - 05/18/19 1754    Clinical Impression Statement  Patient is challenged with RLE foot clearance with limited stability of LLE on stable and unstable surfaces. Patient requires max cueing from PT and son verbally and visually with demonstration. Improved foot clearance with repetition noted with tactile cueing for weight shift however patient is challenged with fatigue. Pt would benefit from skilled PT intervention for improvements in strength, balance, and gait safety    Personal Factors and Comorbidities  Age;Comorbidity 3+;Education;Past/Current Experience;Social Background;Time since onset of injury/illness/exacerbation    Comorbidities  Stroke, TIA, CVA, arthritis, dyslipedemia, HTN, DM, ataxia, needs help with interpreter    Examination-Activity Limitations  Hygiene/Grooming;Locomotion Level;Reach  Overhead;Squat;Stairs;Stand;Transfers    Examination-Participation Restrictions  Cleaning;Community Activity;Laundry;Shop;Meal Prep;Yard Work    Conservation officer, historic buildings  Evolving/Moderate complexity    Rehab Potential  Fair    PT Frequency  2x / week    PT Duration  8 weeks    PT Treatment/Interventions  ADLs/Self Care Home Management;Electrical Stimulation;Therapeutic activities;Functional mobility training;Stair training;Gait training;DME Instruction;Therapeutic exercise;Balance training;Neuromuscular re-education;Patient/family education;Manual techniques;Passive range of motion;Energy conservation;Taping;Aquatic Therapy;Biofeedback;Cryotherapy;Moist Heat;Traction;Ultrasound;Orthotic Fit/Training    PT Next Visit Plan  balance, strength    PT Home Exercise Plan  see above    Consulted and Agree with Plan of Care  Patient;Family member/caregiver    Family Member Consulted  son       Patient will benefit from skilled therapeutic intervention in order to improve the following deficits and impairments:  Abnormal gait, Decreased activity tolerance, Decreased balance, Decreased knowledge of precautions, Decreased endurance, Decreased knowledge of use of DME, Decreased mobility, Decreased safety awareness, Difficulty walking, Decreased strength, Improper body mechanics, Decreased coordination, Impaired perceived functional ability, Impaired flexibility, Impaired vision/preception, Postural dysfunction, Pain  Visit Diagnosis: Other abnormalities of gait and mobility  Muscle weakness (generalized)  Unsteadiness on feet     Problem List Patient Active Problem List   Diagnosis Date Noted  . Hypertensive urgency 12/31/2016  . Stroke (HCC) 10/15/2016  . TIA (transient ischemic attack) 03/07/2016  . Acute CVA (cerebrovascular accident) (HCC) 07/03/2015    Precious Bard, PT, DPT   05/18/2019, 5:55 PM  [place] MAIN [facility] SERVICES [address] [place], [place], [address] Phone: [phone]   Fax:  [phone]  Name: Tyri Elmore MRN: [id number] Date of Birth: 07/05/1926

## 2019-05-19 ENCOUNTER — Ambulatory Visit: Payer: Medicare Other

## 2019-05-25 ENCOUNTER — Ambulatory Visit: Payer: Medicare Other

## 2019-05-25 ENCOUNTER — Other Ambulatory Visit: Payer: Self-pay

## 2019-05-25 DIAGNOSIS — M6281 Muscle weakness (generalized): Secondary | ICD-10-CM

## 2019-05-25 DIAGNOSIS — R2689 Other abnormalities of gait and mobility: Secondary | ICD-10-CM | POA: Diagnosis not present

## 2019-05-25 DIAGNOSIS — R2681 Unsteadiness on feet: Secondary | ICD-10-CM

## 2019-05-25 NOTE — Therapy (Signed)
[facility] [address] , [place], [address] Phone: [phone]   Fax:  [phone]  Physical Therapy Treatment  Patient Details  Name: Christopher [name] MRN: [id number] Date of Birth: 04-Mar-1926 Referring Provider (PT): Dr. Hans EdenNgwe Ayacock   Encounter Date: 05/25/2019  PT End of Session - 05/25/19 1630    Visit Number  4    Number of Visits  16    Date for PT Re-Evaluation  07/06/19    Authorization Type  4/10 eval 05/11/19    PT Start Time  1600    PT Stop Time  1642    PT Time Calculation (min)  42 min    Equipment Utilized During Treatment  Gait belt    Activity Tolerance  Patient tolerated treatment well;Other (comment)   comprehension   Behavior During Therapy  Flat affect;WFL for tasks assessed/performed       Past Medical History:  Diagnosis Date  . Arthritis   . Ataxia   . Diabetes mellitus without complication (HCC)    controlled, pt checks it in the morning   . Dyslipidemia   . Hypertension    somewhat controlled, pt checks every morning, he reports it has been good.  . Stroke (HCC)   . TIA (transient ischemic attack)     Past Surgical History:  Procedure Laterality Date  . none      There were no vitals filed for this visit.  Subjective Assessment - 05/25/19 1604    Subjective  Patient presents without his son, he is having difficulty understanding PT. Missed last session.    Patient is accompained by:  Family member   son is interpreter.   Pertinent History  Patient is a 85 year old male who presents for abnormal gait, worsening over the past 2-3 weeks. Patient's son present and states walking is hard and not walking much anymore.  He has been seen previously at this facility ~ 1 year ago and discharged due to plateau of progress. Since he was seen last patient has received home health PT per physician notes.  Recent MRI (02/04/19) shows minimal changes in brain since 2018 chronic lacunar  infarcts, bilateral Anterior Cerebral Artery hyper intensities, moderate white matter microvascular ischemic changes and atrophy of the brain. X ray of knee shows no acute findings with moderate medial compartment joint space narrowing and of hip showing no fracture, bone lesion or hip joint abnormality. Patient bathes and dresses self independently. 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 No falls reported. Want to use machines to get strength back per son. Patient is having less comprehension requiring son for translation.    Limitations  Lifting;Standing;Walking;House hold activities    How long can you sit comfortably?  n/a    How long can you stand comfortably?  as soon as stand.    How long can you walk comfortably?  across his house     Diagnostic tests  Recent MRI (02/04/19) shows minimal changes in brain since 2018 chronic lacunar infarcts, bilateral Anterior Cerebral Artery hyper intensities, moderate white matter microvascular ischemic changes and atrophy of the brain. X ray of knee shows no acute findings with moderate medial compartment joint space narrowing and of hip showing no fracture, bone lesion or hip joint abnormality.    Patient Stated Goals  get some strength back and walk better.    Currently in Pain?  No/denies  Attempted to educated patient on need for son to be present due to patient's limited understanding of therapist at this time. Attempted education on need to call when not going to come to therapy due to no show last week.       Nustep Lvl 4 Rpm > 60 for cardiovascular and musculoskeletal challenge. 3 minutes       Quantum leg press bilateral,#115 lb 10x 2 sets second set 130 ; cueing for foot alignment and speed of muscle activation/sequencing. Cueing to not straighten LLE fully to protect L knee    Quantum leg press single LE #60 lb 10x 2 sets;   each LE cueing for foot alignment and speed of muscle activation/sequencing.  in //  bars:Patient requires CGA for all standing interventions   Speed ladder: one foot in each square decreasing from BUE support to single UE support. Cueing for widening step length for improved gait mechanics/foot clearance. 10x length of // bars. Ambulate with QC outside of // bars with slight improvement in RLE foot clearance and step length.   6" step step ups 15x each LE BUE support. Very fatiguing by end.  6" step lateral step ups 15x each LE, BUE support cueing for sequencing required    Orange hurdle: BUE support with cueing for step over for improved hip/foot clearance bilaterally; 12x each LE       Pt educated throughout session about proper posture and technique with exercises. Improved exercise technique, movement at target joints, use of target muscles after min to mod verbal, visual, tactile cues               PT Education - 05/25/19 1607    Education provided  Yes    Education Details  exercise technique, body mechanics    Person(s) Educated  Patient    Methods  Explanation;Demonstration;Verbal cues;Tactile cues    Comprehension  Verbalized understanding;Returned demonstration;Verbal cues required;Tactile cues required;Need further instruction       PT Short Term Goals - 05/11/19 1751      PT SHORT TERM GOAL #1   Title  Patient will be independent in home exercise program to improve strength/mobility for better functional independence with ADLs.    Baseline  11/4: HEP given    Time  4    Period  Weeks    Status  New    Target Date  06/08/19        PT Long Term Goals - 05/11/19 1752      PT LONG TERM GOAL #1   Title  Patient will demonstrate an improved Berg Balance Score of >40/56 as to demonstrate improved balance with ADLs such as sitting/standing and transfer balance and reduced fall risk    Baseline  11/4: 34/56    Time  8    Period  Weeks    Status  New    Target Date  07/06/19      PT LONG TERM GOAL #2   Title  Patient will increase 10  meter walk test to >1.74m/s as to improve gait speed for better community ambulation and to reduce fall risk    Baseline  11/4: 0.31 m/s with quad cane    Time  8    Period  Weeks    Status  New    Target Date  07/06/19      PT LONG TERM GOAL #3   Title  Patient will increase BLE gross strength to 4+/5 as to improve functional strength for independent  gait, increased standing tolerance and increased ADL ability.    Baseline  11/4: L: grossly 4-/5 R grossly 3+/5 with hip add/ext 3/5    Time  8    Period  Weeks    Status  New    Target Date  07/06/19      PT LONG TERM GOAL #4   Title  Patient will increase six minute walk test distance to >800 for progression to community ambulator and improve gait ability    Baseline  11/4: unable to perform    Time  8    Period  Weeks    Status  New    Target Date  07/06/19            Plan - 05/25/19 1632    Clinical Impression Statement  Patient does not respond well to verbal cueing at this time, is able to correct/modify technique with use of demonstration. Education on need for showing up to sessions as well as need for son to be present given however unable to assess patient's understanding. Pt would benefit from skilled PT intervention for improvements in strength, balance, and gait safety    Personal Factors and Comorbidities  Age;Comorbidity 3+;Education;Past/Current Experience;Social Background;Time since onset of injury/illness/exacerbation    Comorbidities  Stroke, TIA, CVA, arthritis, dyslipedemia, HTN, DM, ataxia, needs help with interpreter    Examination-Activity Limitations  Hygiene/Grooming;Locomotion Level;Reach Overhead;Squat;Stairs;Stand;Transfers    Examination-Participation Restrictions  Cleaning;Community Activity;Laundry;Shop;Meal Prep;Yard Work    Conservation officer, historic buildings  Evolving/Moderate complexity    Rehab Potential  Fair    PT Frequency  2x / week    PT Duration  8 weeks    PT Treatment/Interventions   ADLs/Self Care Home Management;Electrical Stimulation;Therapeutic activities;Functional mobility training;Stair training;Gait training;DME Instruction;Therapeutic exercise;Balance training;Neuromuscular re-education;Patient/family education;Manual techniques;Passive range of motion;Energy conservation;Taping;Aquatic Therapy;Biofeedback;Cryotherapy;Moist Heat;Traction;Ultrasound;Orthotic Fit/Training    PT Next Visit Plan  balance, strength    PT Home Exercise Plan  see above    Consulted and Agree with Plan of Care  Patient;Family member/caregiver    Family Member Consulted  son       Patient will benefit from skilled therapeutic intervention in order to improve the following deficits and impairments:  Abnormal gait, Decreased activity tolerance, Decreased balance, Decreased knowledge of precautions, Decreased endurance, Decreased knowledge of use of DME, Decreased mobility, Decreased safety awareness, Difficulty walking, Decreased strength, Improper body mechanics, Decreased coordination, Impaired perceived functional ability, Impaired flexibility, Impaired vision/preception, Postural dysfunction, Pain  Visit Diagnosis: Other abnormalities of gait and mobility  Muscle weakness (generalized)  Unsteadiness on feet     Problem List Patient Active Problem List   Diagnosis Date Noted  . Hypertensive urgency 12/31/2016  . Stroke (HCC) 10/15/2016  . TIA (transient ischemic attack) 03/07/2016  . Acute CVA (cerebrovascular accident) (HCC) 07/03/2015   Precious Bard, PT, DPT   05/26/2019, 10:24 AM  [facility] MAIN [facility] SERVICES [address] [place], [place], [address] Phone: [phone]   Fax:  [phone]  Name: Willford Rabideau MRN: [id number] Date of Birth: 11-11-1925

## 2019-05-26 ENCOUNTER — Ambulatory Visit: Payer: Medicare Other

## 2019-05-26 DIAGNOSIS — M6281 Muscle weakness (generalized): Secondary | ICD-10-CM

## 2019-05-26 DIAGNOSIS — R2689 Other abnormalities of gait and mobility: Secondary | ICD-10-CM

## 2019-05-26 DIAGNOSIS — R2681 Unsteadiness on feet: Secondary | ICD-10-CM

## 2019-05-26 NOTE — Therapy (Signed)
[place] MAIN [facility] SERVICES [address] [place], [place], [address] Phone: [phone]   Fax:  [phone]  Patient Details  Name: Christopher [name] MRN: [id number] Date of Birth: 01-06-1926 Referring Provider:  Donnie Coffin, MD  Encounter Date: 05/26/2019  Patient son not present for physical therapy session. Son called and re-explained need for him to be present in order to proceed with session. Son did not show up for >45 minutes. Upon getting to therapy meeting with son and patient performed. Re-explained need for either son or interpreter to be present for physical therapy session due to safety. Explained again that we can provide an interpreter, son yet again declined interpreter services stating he will be at sessions in the future. Offered supervisor's card for further details/options if needed, patient's son declined.   Janna Arch, PT, DPT   05/26/2019, 4:58 PM  [place] MAIN [facility] SERVICES [address] [place], [place], [address] Phone: [phone]   Fax:  [phone]

## 2019-06-01 ENCOUNTER — Other Ambulatory Visit: Payer: Self-pay

## 2019-06-01 ENCOUNTER — Ambulatory Visit: Payer: Medicare Other

## 2019-06-01 DIAGNOSIS — R2689 Other abnormalities of gait and mobility: Secondary | ICD-10-CM

## 2019-06-01 DIAGNOSIS — M6281 Muscle weakness (generalized): Secondary | ICD-10-CM

## 2019-06-01 DIAGNOSIS — R2681 Unsteadiness on feet: Secondary | ICD-10-CM

## 2019-06-01 NOTE — Therapy (Signed)
[place] MAIN [name] SERVICES [address] [place], [place], [address] Phone: [phone]   Fax:  [phone]  Physical Therapy Treatment  Patient Details  Name: Christopher [name] MRN: [id number] Date of Birth: January 05, 1926 Referring Provider (PT): Dr. Chryl Heck   Encounter Date: 06/01/2019  PT End of Session - 06/01/19 1756    Visit Number  5    Number of Visits  16    Date for PT Re-Evaluation  07/06/19    Authorization Type  5/10 eval 05/11/19    PT Start Time  1600    PT Stop Time  1644    PT Time Calculation (min)  44 min    Equipment Utilized During Treatment  Gait belt    Activity Tolerance  Patient tolerated treatment well;Other (comment)   comprehension   Behavior During Therapy  Flat affect;WFL for tasks assessed/performed       Past Medical History:  Diagnosis Date  . Arthritis   . Ataxia   . Diabetes mellitus without complication ([place])    controlled, pt checks it in the morning   . Dyslipidemia   . Hypertension    somewhat controlled, pt checks every morning, he reports it has been good.  . Stroke ([place])   . TIA (transient ischemic attack)     Past Surgical History:  Procedure Laterality Date  . none      There were no vitals filed for this visit.  Subjective Assessment - 06/01/19 1754    Subjective  Patient presents with his son to physical therapy session. Reports no falls per son report. Patient and son refuse interpreter services again.    Patient is accompained by:  Family member   son is interpreter.   Pertinent History  Patient is a 85 year old male who presents for abnormal gait, worsening over the past 2-3 weeks. Patient's son present and states walking is hard and not walking much anymore.  He has been seen previously at this facility ~ 1 year ago and discharged due to plateau of progress. Since he was seen last patient has received home health PT per physician notes.  Recent MRI (02/04/19) shows minimal  changes in brain since 2018 chronic lacunar infarcts, bilateral Anterior Cerebral Artery hyper intensities, moderate white matter microvascular ischemic changes and atrophy of the brain. X ray of knee shows no acute findings with moderate medial compartment joint space narrowing and of hip showing no fracture, bone lesion or hip joint abnormality. Patient bathes and dresses self independently. 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 No falls reported. Want to use machines to get strength back per son. Patient is having less comprehension requiring son for translation.    Limitations  Lifting;Standing;Walking;House hold activities    How long can you sit comfortably?  n/a    How long can you stand comfortably?  as soon as stand.    How long can you walk comfortably?  across his house     Diagnostic tests  Recent MRI (02/04/19) shows minimal changes in brain since 2018 chronic lacunar infarcts, bilateral Anterior Cerebral Artery hyper intensities, moderate white matter microvascular ischemic changes and atrophy of the brain. X ray of knee shows no acute findings with moderate medial compartment joint space narrowing and of hip showing no fracture, bone lesion or hip joint abnormality.    Patient Stated Goals  get some strength back and walk better.    Currently in Pain?  No/denies  Speed ladder: one foot in each square decreasing from BUE support to single UE support. Cueing for widening step length for improved gait mechanics/foot clearance. 12x length of // bars. Ambulate with QC outside of // bars with slight improvement in RLE foot clearance and step length.   Side step length of speed ladder. BUE support ; very challenging for patient to hip flex/knee flex for foot clearance, 12x each side   Ambulate on unstable red mat 6x length of / bars with SUE support.   Balloon taps inside/outside BOS 3 minutes  bosu ball lunge SUE support 15x each LE, max cueing.     6" step step ups 15x each LE BUE support. Very fatiguing by end.   6" step lateral step ups 15x each LE, BUE support cueing for sequencing required    Orange hurdle: BUE support with cueing for step over for improved hip/foot clearance bilaterally; 12x each LE     4lb ankle weights -standing walking marches 6x length of // bars, cueing for hand placement for safety, fatiguing to patient  opp arm leg raises 10x very challenging  Sit to stand raising weighted ball (3000 gr) 15x from plinth table.     Pt educated throughout session about proper posture and technique with exercises. Improved exercise technique, movement at target joints, use of target muscles after min to mod verbal, visual, tactile cues      Patient and patient's son educated again on option of interpreter, declined interpreter again.                PT Education - 06/01/19 1755    Education provided  Yes    Education Details  exercise technique, body mechanics    Person(s) Educated  Patient    Methods  Explanation;Demonstration;Tactile cues;Verbal cues    Comprehension  Verbalized understanding;Returned demonstration;Verbal cues required;Tactile cues required       PT Short Term Goals - 05/11/19 1751      PT SHORT TERM GOAL #1   Title  Patient will be independent in home exercise program to improve strength/mobility for better functional independence with ADLs.    Baseline  11/4: HEP given    Time  4    Period  Weeks    Status  New    Target Date  06/08/19        PT Long Term Goals - 05/11/19 1752      PT LONG TERM GOAL #1   Title  Patient will demonstrate an improved Berg Balance Score of >40/56 as to demonstrate improved balance with ADLs such as sitting/standing and transfer balance and reduced fall risk    Baseline  11/4: 34/56    Time  8    Period  Weeks    Status  New    Target Date  07/06/19      PT LONG TERM GOAL #2   Title  Patient will increase 10 meter walk test to >1.922m/s  as to improve gait speed for better community ambulation and to reduce fall risk    Baseline  11/4: 0.31 m/s with quad cane    Time  8    Period  Weeks    Status  New    Target Date  07/06/19      PT LONG TERM GOAL #3   Title  Patient will increase BLE gross strength to 4+/5 as to improve functional strength for independent gait, increased standing tolerance and increased ADL ability.    Baseline  11/4: L: grossly 4-/5 R grossly 3+/5 with hip add/ext 3/5    Time  8    Period  Weeks    Status  New    Target Date  07/06/19      PT LONG TERM GOAL #4   Title  Patient will increase six minute walk test distance to >800 for progression to community ambulator and improve gait ability    Baseline  11/4: unable to perform    Time  8    Period  Weeks    Status  New    Target Date  07/06/19            Plan - 06/01/19 1757    Clinical Impression Statement  Patient presents with son to physical therapy. Refuses interpreter services again. Patient is challenged with foot clearance and single limb stability at this time. He requires max cueing verbally and with demonstration for task performance. Occasional correction to son for task performance required due to son asking him to do incorrect task. Pt would benefit from skilled PT intervention for improvements in strength, balance, and gait safety    Personal Factors and Comorbidities  Age;Comorbidity 3+;Education;Past/Current Experience;Social Background;Time since onset of injury/illness/exacerbation    Comorbidities  Stroke, TIA, CVA, arthritis, dyslipedemia, HTN, DM, ataxia, needs help with interpreter    Examination-Activity Limitations  Hygiene/Grooming;Locomotion Level;Reach Overhead;Squat;Stairs;Stand;Transfers    Examination-Participation Restrictions  Cleaning;Community Activity;Laundry;Shop;Meal Prep;Yard Work    Conservation officer, historic buildings  Evolving/Moderate complexity    Rehab Potential  Fair    PT Frequency  2x / week     PT Duration  8 weeks    PT Treatment/Interventions  ADLs/Self Care Home Management;Electrical Stimulation;Therapeutic activities;Functional mobility training;Stair training;Gait training;DME Instruction;Therapeutic exercise;Balance training;Neuromuscular re-education;Patient/family education;Manual techniques;Passive range of motion;Energy conservation;Taping;Aquatic Therapy;Biofeedback;Cryotherapy;Moist Heat;Traction;Ultrasound;Orthotic Fit/Training    PT Next Visit Plan  balance, strength    PT Home Exercise Plan  see above    Consulted and Agree with Plan of Care  Patient;Family member/caregiver    Family Member Consulted  son       Patient will benefit from skilled therapeutic intervention in order to improve the following deficits and impairments:  Abnormal gait, Decreased activity tolerance, Decreased balance, Decreased knowledge of precautions, Decreased endurance, Decreased knowledge of use of DME, Decreased mobility, Decreased safety awareness, Difficulty walking, Decreased strength, Improper body mechanics, Decreased coordination, Impaired perceived functional ability, Impaired flexibility, Impaired vision/preception, Postural dysfunction, Pain  Visit Diagnosis: Other abnormalities of gait and mobility  Muscle weakness (generalized)  Unsteadiness on feet     Problem List Patient Active Problem List   Diagnosis Date Noted  . Hypertensive urgency 12/31/2016  . Stroke (HCC) 10/15/2016  . TIA (transient ischemic attack) 03/07/2016  . Acute CVA (cerebrovascular accident) (HCC) 07/03/2015   Precious Bard, PT, DPT    06/01/2019, 5:58 PM  [facility] MAIN [facility] SERVICES [address] [place], [place], [address] Phone: [phone]   Fax:  [phone]  Name: Christopher [name] MRN: [id number] Date of Birth: 24-Apr-1926

## 2019-06-08 ENCOUNTER — Other Ambulatory Visit: Payer: Self-pay

## 2019-06-08 ENCOUNTER — Ambulatory Visit: Payer: Medicare Other | Attending: Family Medicine

## 2019-06-08 DIAGNOSIS — R269 Unspecified abnormalities of gait and mobility: Secondary | ICD-10-CM | POA: Insufficient documentation

## 2019-06-08 DIAGNOSIS — M6281 Muscle weakness (generalized): Secondary | ICD-10-CM | POA: Diagnosis present

## 2019-06-08 DIAGNOSIS — R2681 Unsteadiness on feet: Secondary | ICD-10-CM | POA: Diagnosis present

## 2019-06-08 DIAGNOSIS — R2689 Other abnormalities of gait and mobility: Secondary | ICD-10-CM | POA: Diagnosis not present

## 2019-06-08 NOTE — Therapy (Signed)
[facility] [address] , [place], [address] Phone: [phone]   Fax:  [phone]  Physical Therapy Treatment  Patient Details  Name: Jabier Muttonqbal Councilman MRN: [id number] Date of Birth: 11/25/1925 Referring Provider (PT): Dr. Hans EdenNgwe Ayacock   Encounter Date: 06/08/2019  PT End of Session - 06/08/19 1751    Visit Number  6    Number of Visits  16    Date for PT Re-Evaluation  07/06/19    Authorization Type  6/10 eval 05/11/19    PT Start Time  1607    PT Stop Time  1645    PT Time Calculation (min)  38 min    Equipment Utilized During Treatment  Gait belt    Activity Tolerance  Patient tolerated treatment well;Other (comment)   comprehension   Behavior During Therapy  Flat affect;WFL for tasks assessed/performed       Past Medical History:  Diagnosis Date  . Arthritis   . Ataxia   . Diabetes mellitus without complication (HCC)    controlled, pt checks it in the morning   . Dyslipidemia   . Hypertension    somewhat controlled, pt checks every morning, he reports it has been good.  . Stroke (HCC)   . TIA (transient ischemic attack)     Past Surgical History:  Procedure Laterality Date  . none      There were no vitals filed for this visit.  Subjective Assessment - 06/08/19 1612    Subjective  Patient reports no pain, son reports they are slightly late due to having to get blood drawn prior to PT session. Reports no pain or LOB since last session.    Patient is accompained by:  Family member   son is interpreter.   Pertinent History  Patient is a 85 year old male who presents for abnormal gait, worsening over the past 2-3 weeks. Patient's son present and states walking is hard and not walking much anymore.  He has been seen previously at this facility ~ 1 year ago and discharged due to plateau of progress. Since he was seen last patient has received home health PT per physician notes.  Recent MRI (02/04/19) shows  minimal changes in brain since 2018 chronic lacunar infarcts, bilateral Anterior Cerebral Artery hyper intensities, moderate white matter microvascular ischemic changes and atrophy of the brain. X ray of knee shows no acute findings with moderate medial compartment joint space narrowing and of hip showing no fracture, bone lesion or hip joint abnormality. Patient bathes and dresses self independently. 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 No falls reported. Want to use machines to get strength back per son. Patient is having less comprehension requiring son for translation.    Limitations  Lifting;Standing;Walking;House hold activities    How long can you sit comfortably?  n/a    How long can you stand comfortably?  as soon as stand.    How long can you walk comfortably?  across his house     Diagnostic tests  Recent MRI (02/04/19) shows minimal changes in brain since 2018 chronic lacunar infarcts, bilateral Anterior Cerebral Artery hyper intensities, moderate white matter microvascular ischemic changes and atrophy of the brain. X ray of knee shows no acute findings with moderate medial compartment joint space narrowing and of hip showing no fracture, bone lesion or hip joint abnormality.    Patient Stated Goals  get some strength back and walk better.  Currently in Pain?  No/denies             Nustep Lvl 5 Rpm > 80 for cardiovascular and musculoskeletal challenge. 3 minutes       Quantum leg press bilateral,#130 lb 12x 2 sets second set 130 ; cueing for foot alignment and speed of muscle activation/sequencing. Cueing to not straighten LLE fully to protect L knee    Quantum leg press single LE #65 lb 12x 2 sets;   each LE cueing for foot alignment and speed of muscle activation/sequencing.  in // bars:Patient requires CGA for all standing interventions   Half foam roller df/pf with UE support 20x  airexpad: one foot in each color for modified tandem stance 2x 45 second  holds with No UE support, challenging initially for patient  6" step step ups 15x each LE BUE support. Very fatiguing by end.   6" step lateral step ups 15x each LE, BUE support cueing for sequencing required    Orange hurdle: BUE support with cueing for step over for improved hip/foot clearance bilaterally; 12x each LE   Sit to stand raising weighted ball (2000 gr) 10x     Pt educated throughout session about proper posture and technique with exercises. Improved exercise technique, movement at target joints, use of target muscles after min to mod verbal, visual, tactile cues   Patient is improving with functional strength with increased resistance tolerated with strengthening interventions. Modified tandem stance continues to challenge patient at this time. Step ups are challenging for patient at this time. Pt would benefit from skilled PT intervention for improvements in strength, balance, and gait safety                   PT Education - 06/08/19 1614    Education provided  Yes    Education Details  exercise technique, body mechanics    Person(s) Educated  Patient    Methods  Explanation;Demonstration;Tactile cues;Verbal cues    Comprehension  Verbalized understanding;Returned demonstration;Verbal cues required;Tactile cues required       PT Short Term Goals - 05/11/19 1751      PT SHORT TERM GOAL #1   Title  Patient will be independent in home exercise program to improve strength/mobility for better functional independence with ADLs.    Baseline  11/4: HEP given    Time  4    Period  Weeks    Status  New    Target Date  06/08/19        PT Long Term Goals - 05/11/19 1752      PT LONG TERM GOAL #1   Title  Patient will demonstrate an improved Berg Balance Score of >40/56 as to demonstrate improved balance with ADLs such as sitting/standing and transfer balance and reduced fall risk    Baseline  11/4: 34/56    Time  8    Period  Weeks    Status  New     Target Date  07/06/19      PT LONG TERM GOAL #2   Title  Patient will increase 10 meter walk test to >1.32m/s as to improve gait speed for better community ambulation and to reduce fall risk    Baseline  11/4: 0.31 m/s with quad cane    Time  8    Period  Weeks    Status  New    Target Date  07/06/19      PT LONG TERM GOAL #3   Title  Patient will increase BLE gross strength to 4+/5 as to improve functional strength for independent gait, increased standing tolerance and increased ADL ability.    Baseline  11/4: L: grossly 4-/5 R grossly 3+/5 with hip add/ext 3/5    Time  8    Period  Weeks    Status  New    Target Date  07/06/19      PT LONG TERM GOAL #4   Title  Patient will increase six minute walk test distance to >800 for progression to community ambulator and improve gait ability    Baseline  11/4: unable to perform    Time  8    Period  Weeks    Status  New    Target Date  07/06/19            Plan - 06/08/19 1753    Clinical Impression Statement  Patient is improving with functional strength with increased resistance tolerated with strengthening interventions. Modified tandem stance continues to challenge patient at this time. Step ups are challenging for patient at this time. Pt would benefit from skilled PT intervention for improvements in strength, balance, and gait safety    Personal Factors and Comorbidities  Age;Comorbidity 3+;Education;Past/Current Experience;Social Background;Time since onset of injury/illness/exacerbation    Comorbidities  Stroke, TIA, CVA, arthritis, dyslipedemia, HTN, DM, ataxia, needs help with interpreter    Examination-Activity Limitations  Hygiene/Grooming;Locomotion Level;Reach Overhead;Squat;Stairs;Stand;Transfers    Examination-Participation Restrictions  Cleaning;Community Activity;Laundry;Shop;Meal Prep;Yard Work    Conservation officer, historic buildings  Evolving/Moderate complexity    Rehab Potential  Fair    PT Frequency  2x / week     PT Duration  8 weeks    PT Treatment/Interventions  ADLs/Self Care Home Management;Electrical Stimulation;Therapeutic activities;Functional mobility training;Stair training;Gait training;DME Instruction;Therapeutic exercise;Balance training;Neuromuscular re-education;Patient/family education;Manual techniques;Passive range of motion;Energy conservation;Taping;Aquatic Therapy;Biofeedback;Cryotherapy;Moist Heat;Traction;Ultrasound;Orthotic Fit/Training    PT Next Visit Plan  balance, strength    PT Home Exercise Plan  see above    Consulted and Agree with Plan of Care  Patient;Family member/caregiver    Family Member Consulted  son       Patient will benefit from skilled therapeutic intervention in order to improve the following deficits and impairments:  Abnormal gait, Decreased activity tolerance, Decreased balance, Decreased knowledge of precautions, Decreased endurance, Decreased knowledge of use of DME, Decreased mobility, Decreased safety awareness, Difficulty walking, Decreased strength, Improper body mechanics, Decreased coordination, Impaired perceived functional ability, Impaired flexibility, Impaired vision/preception, Postural dysfunction, Pain  Visit Diagnosis: Other abnormalities of gait and mobility  Muscle weakness (generalized)  Unsteadiness on feet     Problem List Patient Active Problem List   Diagnosis Date Noted  . Hypertensive urgency 12/31/2016  . Stroke (HCC) 10/15/2016  . TIA (transient ischemic attack) 03/07/2016  . Acute CVA (cerebrovascular accident) (HCC) 07/03/2015   Precious Bard, PT, DPT   06/08/2019, 5:55 PM  [facility] MAIN [facility] SERVICES [address] [place], [place], [address] Phone: [phone]   Fax:  [phone]  Name: Shahid Flori MRN: [id number] Date of Birth: Jul 05, 1926

## 2019-06-09 ENCOUNTER — Other Ambulatory Visit: Payer: Self-pay

## 2019-06-09 ENCOUNTER — Ambulatory Visit: Payer: Medicare Other

## 2019-06-09 DIAGNOSIS — R2689 Other abnormalities of gait and mobility: Secondary | ICD-10-CM

## 2019-06-09 DIAGNOSIS — R2681 Unsteadiness on feet: Secondary | ICD-10-CM

## 2019-06-09 DIAGNOSIS — R269 Unspecified abnormalities of gait and mobility: Secondary | ICD-10-CM

## 2019-06-09 DIAGNOSIS — M6281 Muscle weakness (generalized): Secondary | ICD-10-CM

## 2019-06-09 NOTE — Therapy (Signed)
[facility] [address] [place], [place], [address] Phone: [phone]   Fax:  [phone]  Physical Therapy Treatment  Patient Details  Name: Christopher [name] MRN: [id number] Date of Birth: [birth year] Referring Provider (PT): Dr. Hans EdenNgwe Ayacock   Encounter Date: 06/09/2019  PT End of Session - 06/09/19 1605    Visit Number  7    Number of Visits  16    Date for PT Re-Evaluation  07/06/19    Authorization Type  7/10 eval 05/11/19    PT Start Time  1600    PT Stop Time  1622    PT Time Calculation (min)  22 min    Equipment Utilized During Treatment  Gait belt    Activity Tolerance  Patient tolerated treatment well;Other (comment)   comprehension   Behavior During Therapy  Flat affect;WFL for tasks assessed/performed       Past Medical History:  Diagnosis Date  . Arthritis   . Ataxia   . Diabetes mellitus without complication (HCC)    controlled, pt checks it in the morning   . Dyslipidemia   . Hypertension    somewhat controlled, pt checks every morning, he reports it has been good.  . Stroke (HCC)   . TIA (transient ischemic attack)     Past Surgical History:  Procedure Laterality Date  . none      There were no vitals filed for this visit.  Subjective Assessment - 06/09/19 1604    Subjective  Patient presents with son on time to PT session. Reports no pain or falls since last session.    Patient is accompained by:  Family member   son is interpreter.   Pertinent History  Patient is a 85 year old male who presents for abnormal gait, worsening over the past 2-3 weeks. Patient's son present and states walking is hard and not walking much anymore.  He has been seen previously at this facility ~ 1 year ago and discharged due to plateau of progress. Since he was seen last patient has received home health PT per physician notes.  Recent MRI (02/04/19) shows minimal changes in brain since 2018 chronic lacunar infarcts,  bilateral Anterior Cerebral Artery hyper intensities, moderate white matter microvascular ischemic changes and atrophy of the brain. X ray of knee shows no acute findings with moderate medial compartment joint space narrowing and of hip showing no fracture, bone lesion or hip joint abnormality. Patient bathes and dresses self independently. 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 No falls reported. Want to use machines to get strength back per son. Patient is having less comprehension requiring son for translation.    Limitations  Lifting;Standing;Walking;House hold activities    How long can you sit comfortably?  n/a    How long can you stand comfortably?  as soon as stand.    How long can you walk comfortably?  across his house     Diagnostic tests  Recent MRI (02/04/19) shows minimal changes in brain since 2018 chronic lacunar infarcts, bilateral Anterior Cerebral Artery hyper intensities, moderate white matter microvascular ischemic changes and atrophy of the brain. X ray of knee shows no acute findings with moderate medial compartment joint space narrowing and of hip showing no fracture, bone lesion or hip joint abnormality.    Patient Stated Goals  get some strength back and walk better.    Currently in Pain?  No/denies  179/82    Nustep Lvl 5 Rpm > 80 for cardiovascular and musculoskeletal challenge. 4 minutes     Ambulate with rollator in gym 60 ft with cueing for hip/knee flexion for foot clearance.   Sit to stand with rollator: education on locking/unlocking of rollator.      HR monitoring : HR would not decrease below 110 bpm with prolonged rest and breathing cuesand was elevated to 130 with movement.   Patient and patient's son educated on need for compliance with medication for ability to continue physical therapy.     Patient's session cut short due to patient's elevated HR. Patient unable to tolerate any interventions with immediate increase to >130  and with rest and decreased to 110 at lowest with breathing and rest. Patient's son upset with patient forgetting to take medication. Session terminated early and patient taken home to take his medication.                  PT Education - 06/09/19 1604    Education provided  Yes    Education Details  medication compliance    Person(s) Educated  Patient    Methods  Explanation    Comprehension  Verbalized understanding       PT Short Term Goals - 05/11/19 1751      PT SHORT TERM GOAL #1   Title  Patient will be independent in home exercise program to improve strength/mobility for better functional independence with ADLs.    Baseline  11/4: HEP given    Time  4    Period  Weeks    Status  New    Target Date  06/08/19        PT Long Term Goals - 05/11/19 1752      PT LONG TERM GOAL #1   Title  Patient will demonstrate an improved Berg Balance Score of >40/56 as to demonstrate improved balance with ADLs such as sitting/standing and transfer balance and reduced fall risk    Baseline  11/4: 34/56    Time  8    Period  Weeks    Status  New    Target Date  07/06/19      PT LONG TERM GOAL #2   Title  Patient will increase 10 meter walk test to >1.74m/s as to improve gait speed for better community ambulation and to reduce fall risk    Baseline  11/4: 0.31 m/s with quad cane    Time  8    Period  Weeks    Status  New    Target Date  07/06/19      PT LONG TERM GOAL #3   Title  Patient will increase BLE gross strength to 4+/5 as to improve functional strength for independent gait, increased standing tolerance and increased ADL ability.    Baseline  11/4: L: grossly 4-/5 R grossly 3+/5 with hip add/ext 3/5    Time  8    Period  Weeks    Status  New    Target Date  07/06/19      PT LONG TERM GOAL #4   Title  Patient will increase six minute walk test distance to >800 for progression to community ambulator and improve gait ability    Baseline  11/4: unable to perform     Time  8    Period  Weeks    Status  New    Target Date  07/06/19  Plan - 06/09/19 1629    Clinical Impression Statement  Patient's session cut short due to patient's elevated HR. Patient unable to tolerate any interventions with immediate increase to >130 and with rest and decreased to 110 at lowest with breathing and rest. Patient's son upset with patient forgetting to take medication. Session terminated early and patient taken home to take his medication.    Personal Factors and Comorbidities  Age;Comorbidity 3+;Education;Past/Current Experience;Social Background;Time since onset of injury/illness/exacerbation    Comorbidities  Stroke, TIA, CVA, arthritis, dyslipedemia, HTN, DM, ataxia, needs help with interpreter    Examination-Activity Limitations  Hygiene/Grooming;Locomotion Level;Reach Overhead;Squat;Stairs;Stand;Transfers    Examination-Participation Restrictions  Cleaning;Community Activity;Laundry;Shop;Meal Prep;Yard Work    Merchant navy officer  Evolving/Moderate complexity    Rehab Potential  Fair    PT Frequency  2x / week    PT Duration  8 weeks    PT Treatment/Interventions  ADLs/Self Care Home Management;Electrical Stimulation;Therapeutic activities;Functional mobility training;Stair training;Gait training;DME Instruction;Therapeutic exercise;Balance training;Neuromuscular re-education;Patient/family education;Manual techniques;Passive range of motion;Energy conservation;Taping;Aquatic Therapy;Biofeedback;Cryotherapy;Moist Heat;Traction;Ultrasound;Orthotic Fit/Training    PT Next Visit Plan  balance, strength    PT Home Exercise Plan  see above    Consulted and Agree with Plan of Care  Patient;Family member/caregiver    Family Member Consulted  son       Patient will benefit from skilled therapeutic intervention in order to improve the following deficits and impairments:  Abnormal gait, Decreased activity tolerance, Decreased balance, Decreased  knowledge of precautions, Decreased endurance, Decreased knowledge of use of DME, Decreased mobility, Decreased safety awareness, Difficulty walking, Decreased strength, Improper body mechanics, Decreased coordination, Impaired perceived functional ability, Impaired flexibility, Impaired vision/preception, Postural dysfunction, Pain  Visit Diagnosis: Other abnormalities of gait and mobility  Muscle weakness (generalized)  Unsteadiness on feet  Abnormality of gait     Problem List Patient Active Problem List   Diagnosis Date Noted  . Hypertensive urgency 12/31/2016  . Stroke ([place]) 10/15/2016  . TIA (transient ischemic attack) 03/07/2016  . Acute CVA (cerebrovascular accident) ([place]) 07/03/2015   Janna Arch, PT, DPT   06/09/2019, 4:31 PM  [place] MAIN [facility] SERVICES [address] [place], [place], [address] Phone: [phone]   Fax:  [phone]  Name: Jeet Shough MRN: [id number] Date of Birth: 1925/11/22

## 2019-06-15 ENCOUNTER — Ambulatory Visit: Payer: Medicare Other

## 2019-06-16 ENCOUNTER — Ambulatory Visit: Payer: Medicare Other

## 2019-06-22 ENCOUNTER — Ambulatory Visit: Payer: Medicare Other

## 2019-06-23 ENCOUNTER — Ambulatory Visit: Payer: Medicare Other

## 2019-06-29 ENCOUNTER — Ambulatory Visit: Payer: Medicare Other

## 2019-06-29 ENCOUNTER — Other Ambulatory Visit: Payer: Self-pay

## 2019-06-29 DIAGNOSIS — R2681 Unsteadiness on feet: Secondary | ICD-10-CM

## 2019-06-29 DIAGNOSIS — M6281 Muscle weakness (generalized): Secondary | ICD-10-CM

## 2019-06-29 DIAGNOSIS — R2689 Other abnormalities of gait and mobility: Secondary | ICD-10-CM | POA: Diagnosis not present

## 2019-06-29 NOTE — Therapy (Signed)
[place] [facility] MAIN [facility] SERVICES [address] [place], [place], [address] Phone: [phone]   Fax:  [phone]  Physical Therapy Treatment  Patient Details  Name: Christopher [name] MRN: [id number] Date of Birth: 1925/08/06 Referring Provider (PT): Dr. Hans Eden   Encounter Date: 06/29/2019  PT End of Session - 06/30/19 1147    Visit Number  8    Number of Visits  16    Date for PT Re-Evaluation  07/06/19    Authorization Type  8/10 eval 05/11/19    PT Start Time  1600    PT Stop Time  1644    PT Time Calculation (min)  44 min    Equipment Utilized During Treatment  Gait belt    Activity Tolerance  Patient tolerated treatment well;Other (comment)   comprehension   Behavior During Therapy  Flat affect;WFL for tasks assessed/performed       Past Medical History:  Diagnosis Date  . Arthritis   . Ataxia   . Diabetes mellitus without complication (HCC)    controlled, pt checks it in the morning   . Dyslipidemia   . Hypertension    somewhat controlled, pt checks every morning, he reports it has been good.  . Stroke (HCC)   . TIA (transient ischemic attack)     Past Surgical History:  Procedure Laterality Date  . none      There were no vitals filed for this visit.  Subjective Assessment - 06/30/19 1145    Subjective  Patient presents for first time with son since last session ~ 3 weeks ago. Reports no falls or LOB since last session. Reports patient is taking his medication.    Patient is accompained by:  Family member   son is interpreter.   Pertinent History  Patient is a 85 year old male who presents for abnormal gait, worsening over the past 2-3 weeks. Patient's son present and states walking is hard and not walking much anymore.  He has been seen previously at this facility ~ 1 year ago and discharged due to plateau of progress. Since he was seen last patient has received home health PT per physician notes.  Recent MRI (02/04/19)  shows minimal changes in brain since 2018 chronic lacunar infarcts, bilateral Anterior Cerebral Artery hyper intensities, moderate white matter microvascular ischemic changes and atrophy of the brain. X ray of knee shows no acute findings with moderate medial compartment joint space narrowing and of hip showing no fracture, bone lesion or hip joint abnormality. Patient bathes and dresses self independently. 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 No falls reported. Want to use machines to get strength back per son. Patient is having less comprehension requiring son for translation.    Limitations  Lifting;Standing;Walking;House hold activities    How long can you sit comfortably?  n/a    How long can you stand comfortably?  as soon as stand.    How long can you walk comfortably?  across his house     Diagnostic tests  Recent MRI (02/04/19) shows minimal changes in brain since 2018 chronic lacunar infarcts, bilateral Anterior Cerebral Artery hyper intensities, moderate white matter microvascular ischemic changes and atrophy of the brain. X ray of knee shows no acute findings with moderate medial compartment joint space narrowing and of hip showing no fracture, bone lesion or hip joint abnormality.    Patient Stated Goals  get some strength back and walk better.  Currently in Pain?  No/denies         vitals: 140/75 pulse 83   Treatment:   Speed ladder: one foot in each square decreasing from BUE support to single UE support. Cueing for widening step length for improved gait mechanics/foot clearance. 8x length of // bars.   5lb ankle weights standing -march static SUE support, 20x -straight leg raise SUE support, 10x each LE, cueing for keeping knee extended -hip abduction 10x each LE, SUE support -side stepping length of // bars with BUE support, focus on foot clearance/hip/knee flexion. x4 lengths of // bars  airex balance beam: lateral stepping with decreasing UE support from  BUE to SUE to no UE support x 12 total lengths of // bars  cone taps; forward/lateral each direction x 8 each LE, frequent knock over of RLE cone    Orange hurdle: BUE support with cueing for step over for improved hip/foot clearance bilaterally; 12x each LE   6" step step ups 15x each LE BUE support. Very fatiguing by end.  6" step lateral step ups 15x each LE, BUE support cueing for sequencing required   Half foam roller df/pf with UE support 20x   GTB around ankles: side stepping length of // bars with BUE support x 6 lengths of // bars  GTBA around ankles seated LAQ 10x each LE.   Vitals monitored throughout session.    Pt educated throughout session about proper posture and technique with exercises. Improved exercise technique, movement at target joints, use of target muscles after min to mod verbal, visual, tactile cues   PT Education - 06/30/19 1146    Education provided  Yes    Education Details  exercise technique, body mechanics    Person(s) Educated  Patient    Methods  Explanation;Demonstration;Tactile cues;Verbal cues    Comprehension  Verbalized understanding;Returned demonstration;Verbal cues required;Tactile cues required       PT Short Term Goals - 05/11/19 1751      PT SHORT TERM GOAL #1   Title  Patient will be independent in home exercise program to improve strength/mobility for better functional independence with ADLs.    Baseline  11/4: HEP given    Time  4    Period  Weeks    Status  New    Target Date  06/08/19        PT Long Term Goals - 05/11/19 1752      PT LONG TERM GOAL #1   Title  Patient will demonstrate an improved Berg Balance Score of >40/56 as to demonstrate improved balance with ADLs such as sitting/standing and transfer balance and reduced fall risk    Baseline  11/4: 34/56    Time  8    Period  Weeks    Status  New    Target Date  07/06/19      PT LONG TERM GOAL #2   Title  Patient will increase 10 meter walk test to  >1.7823m/s as to improve gait speed for better community ambulation and to reduce fall risk    Baseline  11/4: 0.31 m/s with quad cane    Time  8    Period  Weeks    Status  New    Target Date  07/06/19      PT LONG TERM GOAL #3   Title  Patient will increase BLE gross strength to 4+/5 as to improve functional strength for independent gait, increased standing tolerance and increased ADL ability.  Baseline  11/4: L: grossly 4-/5 R grossly 3+/5 with hip add/ext 3/5    Time  8    Period  Weeks    Status  New    Target Date  07/06/19      PT LONG TERM GOAL #4   Title  Patient will increase six minute walk test distance to >800 for progression to community ambulator and improve gait ability    Baseline  11/4: unable to perform    Time  8    Period  Weeks    Status  New    Target Date  07/06/19            Plan - 06/30/19 1148    Clinical Impression Statement  Patient has not been seen since 06/09/19, son reports it was because of patient not wanting to come for a few and also for conflict of appointment times.  Emphasized need for compliance with POC for progression. Patient challenged with foot clearance with decreasing UE support as well as coordination of RLE. Max cueing requiring with demonstration for task performance. . Pt would benefit from skilled PT intervention for improvements in strength, balance, and gait safety    Personal Factors and Comorbidities  Age;Comorbidity 3+;Education;Past/Current Experience;Social Background;Time since onset of injury/illness/exacerbation    Comorbidities  Stroke, TIA, CVA, arthritis, dyslipedemia, HTN, DM, ataxia, needs help with interpreter    Examination-Activity Limitations  Hygiene/Grooming;Locomotion Level;Reach Overhead;Squat;Stairs;Stand;Transfers    Examination-Participation Restrictions  Cleaning;Community Activity;Laundry;Shop;Meal Prep;Yard Work    [employer] officer  Evolving/Moderate complexity    Rehab Potential   Fair    PT Frequency  2x / week    PT Duration  8 weeks    PT Treatment/Interventions  ADLs/Self Care Home Management;Electrical Stimulation;Therapeutic activities;Functional mobility training;Stair training;Gait training;DME Instruction;Therapeutic exercise;Balance training;Neuromuscular re-education;Patient/family education;Manual techniques;Passive range of motion;Energy conservation;Taping;Aquatic Therapy;Biofeedback;Cryotherapy;Moist Heat;Traction;Ultrasound;Orthotic Fit/Training    PT Next Visit Plan  recert?    PT Home Exercise Plan  see above    Consulted and Agree with Plan of Care  Patient;Family member/caregiver    Family Member Consulted  son       Patient will benefit from skilled therapeutic intervention in order to improve the following deficits and impairments:  Abnormal gait, Decreased activity tolerance, Decreased balance, Decreased knowledge of precautions, Decreased endurance, Decreased knowledge of use of DME, Decreased mobility, Decreased safety awareness, Difficulty walking, Decreased strength, Improper body mechanics, Decreased coordination, Impaired perceived functional ability, Impaired flexibility, Impaired vision/preception, Postural dysfunction, Pain  Visit Diagnosis: Other abnormalities of gait and mobility  Muscle weakness (generalized)  Unsteadiness on feet     Problem List Patient Active Problem List   Diagnosis Date Noted  . Hypertensive urgency 12/31/2016  . Stroke ([place]) 10/15/2016  . TIA (transient ischemic attack) 03/07/2016  . Acute CVA (cerebrovascular accident) ([place]) 07/03/2015   Janna Arch, PT, DPT   06/30/2019, 11:49 AM  [place] MAIN [facility] SERVICES [address] [place], [place], [address] Phone: [phone]   Fax:  [phone]  Name: Christopher [name] MRN: [id number] Date of Birth: December 06, 1925

## 2019-07-06 ENCOUNTER — Ambulatory Visit: Payer: Medicare Other

## 2019-07-07 ENCOUNTER — Ambulatory Visit: Payer: Medicare Other

## 2019-07-13 ENCOUNTER — Ambulatory Visit: Payer: Medicare HMO

## 2019-07-13 ENCOUNTER — Other Ambulatory Visit: Payer: Self-pay

## 2019-07-13 ENCOUNTER — Ambulatory Visit: Payer: Medicare HMO | Attending: Family Medicine

## 2019-07-13 DIAGNOSIS — M6281 Muscle weakness (generalized): Secondary | ICD-10-CM | POA: Diagnosis present

## 2019-07-13 DIAGNOSIS — R269 Unspecified abnormalities of gait and mobility: Secondary | ICD-10-CM | POA: Insufficient documentation

## 2019-07-13 DIAGNOSIS — R471 Dysarthria and anarthria: Secondary | ICD-10-CM | POA: Insufficient documentation

## 2019-07-13 DIAGNOSIS — R2689 Other abnormalities of gait and mobility: Secondary | ICD-10-CM | POA: Diagnosis not present

## 2019-07-13 DIAGNOSIS — R2681 Unsteadiness on feet: Secondary | ICD-10-CM | POA: Diagnosis present

## 2019-07-13 DIAGNOSIS — Z9181 History of falling: Secondary | ICD-10-CM | POA: Insufficient documentation

## 2019-07-13 NOTE — Therapy (Signed)
[place] MAIN [facility] SERVICES [address] [place], [place], [address] Phone: [phone]   Fax:  [phone]  Physical Therapy Treatment/ RECERT  Patient Details  Name: Christopher [name] MRN: [id number] Date of Birth: [birth year] Referring Provider (PT): Dr. Chryl Heck   Encounter Date: 07/13/2019  PT End of Session - 07/14/19 0837    Visit Number  9    Number of Visits  25    Date for PT Re-Evaluation  09/07/19    Authorization Type  9/10 eval 05/11/19    PT Start Time  1612    PT Stop Time  1639    PT Time Calculation (min)  27 min    Equipment Utilized During Treatment  Gait belt    Activity Tolerance  Patient tolerated treatment well;Other (comment)   comprehension   Behavior During Therapy  Flat affect;WFL for tasks assessed/performed       Past Medical History:  Diagnosis Date  . Arthritis   . Ataxia   . Diabetes mellitus without complication ([place])    controlled, pt checks it in the morning   . Dyslipidemia   . Hypertension    somewhat controlled, pt checks every morning, he reports it has been good.  . Stroke ([place])   . TIA (transient ischemic attack)     Past Surgical History:  Procedure Laterality Date  . none      There were no vitals filed for this visit.  Subjective Assessment - 07/14/19 0830    Subjective  Patient presents late to physical therapy session. Reports compliance with HEP since last seen 2 weeks ago. Missed a week due to the holidays.    Patient is accompained by:  Family member   son is interpreter.   Pertinent History  Patient is a 86 year old male who presents for abnormal gait, worsening over the past 2-3 weeks. Patient's son present and states walking is hard and not walking much anymore.  He has been seen previously at this facility ~ 1 year ago and discharged due to plateau of progress. Since he was seen last patient has received home health PT per physician notes.  Recent MRI (02/04/19) shows minimal  changes in brain since 2018 chronic lacunar infarcts, bilateral Anterior Cerebral Artery hyper intensities, moderate white matter microvascular ischemic changes and atrophy of the brain. X ray of knee shows no acute findings with moderate medial compartment joint space narrowing and of hip showing no fracture, bone lesion or hip joint abnormality. Patient bathes and dresses self independently. 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 No falls reported. Want to use machines to get strength back per son. Patient is having less comprehension requiring son for translation.    Limitations  Lifting;Standing;Walking;House hold activities    How long can you sit comfortably?  n/a    How long can you stand comfortably?  as soon as stand.    How long can you walk comfortably?  across his house     Diagnostic tests  Recent MRI (02/04/19) shows minimal changes in brain since 2018 chronic lacunar infarcts, bilateral Anterior Cerebral Artery hyper intensities, moderate white matter microvascular ischemic changes and atrophy of the brain. X ray of knee shows no acute findings with moderate medial compartment joint space narrowing and of hip showing no fracture, bone lesion or hip joint abnormality.    Patient Stated Goals  get some strength back and walk better.    Currently in Pain?  No/denies      948/01 pulse 75    Recert  BERG: 65/53  10 MWT: 16 seconds with holding cane, 22 seconds with cane; +0.625 m/s without cane BLE strength. See goal section  6 minute walk test: 662 ft with quad cane shuffling of right foot exacerbated after ~120 ft of ambulation.       Patient demonstrates progression towards goals, doubling velocity of ambulation during 10 MWT from 0.31 m/s to 0.625 m/s. BERG score improved to 37/56. Patient was able to tolerate 6 minutes of walking ambulating 662 ft with multiple episodes of excessive shuffling of R foot resulting in  Near LOB. Due to patient's progression towards  goals and compliance with HEP he will continue to benefit from PT. Pt would benefit from skilled PT intervention for improvements in strength, balance, and gait safety               PT Education - 07/14/19 0832    Education provided  Yes    Education Details  exercise technique, body mechanics    Person(s) Educated  Patient    Methods  Explanation;Demonstration;Tactile cues;Verbal cues    Comprehension  Verbalized understanding;Returned demonstration;Verbal cues required;Tactile cues required       PT Short Term Goals - 07/14/19 0839      PT SHORT TERM GOAL #1   Title  Patient will be independent in home exercise program to improve strength/mobility for better functional independence with ADLs.    Baseline  11/4: HEP given 1/7: reports compliance    Time  4    Period  Weeks    Status  Achieved    Target Date  06/08/19        PT Long Term Goals - 07/14/19 0839      PT LONG TERM GOAL #1   Title  Patient will demonstrate an improved Berg Balance Score of >40/56 as to demonstrate improved balance with ADLs such as sitting/standing and transfer balance and reduced fall risk    Baseline  11/4: 34/56 1/7: 37/56    Time  8    Period  Weeks    Status  Partially Met    Target Date  09/07/19      PT LONG TERM GOAL #2   Title  Patient will increase 10 meter walk test to >1.33ms as to improve gait speed for better community ambulation and to reduce fall risk    Baseline  11/4: 0.31 m/s with quad cane 1/7: 0.625 m/s holding cane above ground    Time  8    Period  Weeks    Status  Partially Met    Target Date  09/07/19      PT LONG TERM GOAL #3   Title  Patient will increase BLE gross strength to 4+/5 as to improve functional strength for independent gait, increased standing tolerance and increased ADL ability.    Baseline  11/4: L: grossly 4-/5 R grossly 3+/5 with hip add/ext 3/5 1/6; L grossly 4/5 R 4/5 hip add/ext 4-/5    Time  8    Period  Weeks    Status  Partially  Met    Target Date  09/07/19      PT LONG TERM GOAL #4   Title  Patient will increase six minute walk test distance to >800 for progression to community ambulator and improve gait ability    Baseline  11/4: unable to perform 1/6: 662 ft with quad cane and significant R foot  shuffle    Time  8    Period  Weeks    Status  Partially Met    Target Date  09/07/19            Plan - 07/14/19 0843    Clinical Impression Statement  Patient demonstrates progression towards goals, doubling velocity of ambulation during 10 MWT from 0.31 m/s to 0.625 m/s. BERG score improved to 37/56. Patient was able to tolerate 6 minutes of walking ambulating 662 ft with multiple episodes of excessive shuffling of R foot resulting in  Near LOB. Due to patient's progression towards goals and compliance with HEP he will continue to benefit from PT. Pt would benefit from skilled PT intervention for improvements in strength, balance, and gait safety    Personal Factors and Comorbidities  Age;Comorbidity 3+;Education;Past/Current Experience;Social Background;Time since onset of injury/illness/exacerbation    Comorbidities  Stroke, TIA, CVA, arthritis, dyslipedemia, HTN, DM, ataxia, needs help with interpreter    Examination-Activity Limitations  Hygiene/Grooming;Locomotion Level;Reach Overhead;Squat;Stairs;Stand;Transfers    Examination-Participation Restrictions  Cleaning;Community Activity;Laundry;Shop;Meal Prep;Yard Work    [employer] officer  Evolving/Moderate complexity    Rehab Potential  Fair    PT Frequency  2x / week    PT Duration  8 weeks    PT Treatment/Interventions  ADLs/Self Care Home Management;Electrical Stimulation;Therapeutic activities;Functional mobility training;Stair training;Gait training;DME Instruction;Therapeutic exercise;Balance training;Neuromuscular re-education;Patient/family education;Manual techniques;Passive range of motion;Energy conservation;Taping;Aquatic  Therapy;Biofeedback;Cryotherapy;Moist Heat;Traction;Ultrasound;Orthotic Fit/Training    PT Next Visit Plan  balance    PT Home Exercise Plan  see above    Consulted and Agree with Plan of Care  Patient;Family member/caregiver    Family Member Consulted  son       Patient will benefit from skilled therapeutic intervention in order to improve the following deficits and impairments:  Abnormal gait, Decreased activity tolerance, Decreased balance, Decreased knowledge of precautions, Decreased endurance, Decreased knowledge of use of DME, Decreased mobility, Decreased safety awareness, Difficulty walking, Decreased strength, Improper body mechanics, Decreased coordination, Impaired perceived functional ability, Impaired flexibility, Impaired vision/preception, Postural dysfunction, Pain  Visit Diagnosis: Other abnormalities of gait and mobility  Muscle weakness (generalized)  Unsteadiness on feet  Abnormality of gait     Problem List Patient Active Problem List   Diagnosis Date Noted  . Hypertensive urgency 12/31/2016  . Stroke ([place]) 10/15/2016  . TIA (transient ischemic attack) 03/07/2016  . Acute CVA (cerebrovascular accident) ([place]) 07/03/2015   Janna Arch, PT, DPT   07/14/2019, 9:27 AM  [place] MAIN [facility] SERVICES [address] [place], [place], [address] Phone: [phone]   Fax:  [phone]  Name: Christopher [name] MRN: [id number] Date of Birth: 1925/10/31

## 2019-07-14 ENCOUNTER — Ambulatory Visit: Payer: Medicare HMO

## 2019-07-20 ENCOUNTER — Other Ambulatory Visit: Payer: Self-pay

## 2019-07-20 ENCOUNTER — Ambulatory Visit: Payer: Medicare HMO

## 2019-07-20 DIAGNOSIS — R2689 Other abnormalities of gait and mobility: Secondary | ICD-10-CM

## 2019-07-20 DIAGNOSIS — M6281 Muscle weakness (generalized): Secondary | ICD-10-CM

## 2019-07-20 DIAGNOSIS — R2681 Unsteadiness on feet: Secondary | ICD-10-CM

## 2019-07-20 NOTE — Therapy (Signed)
[place] MAIN [facility] SERVICES [address] [place], [place], [address] Phone: [phone]   Fax:  [phone]  Physical Therapy Treatment Physical Therapy Progress Note   Dates of reporting period  05/11/19  to  07/20/19   Patient Details  Name: Christopher [name] MRN: [id number] Date of Birth: 1925-08-06 Referring Provider (PT): Dr. Chryl Heck   Encounter Date: 07/20/2019  PT End of Session - 07/20/19 1742    Visit Number  10    Number of Visits  25    Date for PT Re-Evaluation  09/07/19    Authorization Type  10/10 eval 05/11/19; next session 1/10 Pn 07/20/19    PT Start Time  1616    PT Stop Time  1700    PT Time Calculation (min)  44 min    Equipment Utilized During Treatment  Gait belt    Activity Tolerance  Patient tolerated treatment well;Other (comment)   comprehension   Behavior During Therapy  Flat affect;WFL for tasks assessed/performed       Past Medical History:  Diagnosis Date  . Arthritis   . Ataxia   . Diabetes mellitus without complication ([place])    controlled, pt checks it in the morning   . Dyslipidemia   . Hypertension    somewhat controlled, pt checks every morning, he reports it has been good.  . Stroke ([place])   . TIA (transient ischemic attack)     Past Surgical History:  Procedure Laterality Date  . none      There were no vitals filed for this visit.  Subjective Assessment - 07/20/19 1736    Subjective  Patient and son arrived for PT session. No falls or LOB since last session.    Patient is accompained by:  Family member   son is interpreter.   Pertinent History  Patient is a 86 year old male who presents for abnormal gait, worsening over the past 2-3 weeks. Patient's son present and states walking is hard and not walking much anymore.  He has been seen previously at this facility ~ 1 year ago and discharged due to plateau of progress. Since he was seen last patient has received home health PT per  physician notes.  Recent MRI (02/04/19) shows minimal changes in brain since 2018 chronic lacunar infarcts, bilateral Anterior Cerebral Artery hyper intensities, moderate white matter microvascular ischemic changes and atrophy of the brain. X ray of knee shows no acute findings with moderate medial compartment joint space narrowing and of hip showing no fracture, bone lesion or hip joint abnormality. Patient bathes and dresses self independently. 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 No falls reported. Want to use machines to get strength back per son. Patient is having less comprehension requiring son for translation.    Limitations  Lifting;Standing;Walking;House hold activities    How long can you sit comfortably?  n/a    How long can you stand comfortably?  as soon as stand.    How long can you walk comfortably?  across his house     Diagnostic tests  Recent MRI (02/04/19) shows minimal changes in brain since 2018 chronic lacunar infarcts, bilateral Anterior Cerebral Artery hyper intensities, moderate white matter microvascular ischemic changes and atrophy of the brain. X ray of knee shows no acute findings with moderate medial compartment joint space narrowing and of hip showing no fracture, bone lesion or hip joint abnormality.    Patient Stated Goals  get some  strength back and walk better.    Currently in Pain?  No/denies       Vitals at start of session: Sp02 110%, Pulse 82 BP: 137/71   In // bars: Side step airex balance beam 6x length cueing for picking up feet, one episode of posterior LOB  2x4 between feet, forward backwards ambulation with cueing for foot clearance 6x length of // bars  Obstacle course in // bars: hurdle, half foam roller, hurdle, half foam roller. SUE support; 8x length of // bars  Hurdle step over bilateral LE SUE support ; more challenging RLE; 12x each LE   toy soldiers 6x length of // bars. Hip flexion with knee flexed, kick leg into knee  extension and step forward with heel strike . Max cueing with demonstration required  airex pad: 6 inch step, airex pad: side step from pad to step to pad 12x each direction, 4x without UE support with two near LOB requiring mod A to maintain upright positioning, rest performed with SUE support  Hip flexion into Black band across // bars, cueing for decreasing velocity and decreasing UE support from Single UE to no UE support.   Lateral squat side stepping in // bars 4x length of // bars UE support with cueing for depth of squat.   Half foam roller df/pf 20x BUE support cueing for pelvic control/placement  Modified single limb stability with opp LE on soccer ball. 2x 30 second holds LLE, unable to tolerate on RLE.  Modified single Right LE stance phase with march of LLE with static hold 15-30 second holds 5x with SUE support      Patient's condition has the potential to improve in response to therapy. Maximum improvement is yet to be obtained. The anticipated improvement is attainable and reasonable in a generally predictable time.  Patient and patient's son report patient is walking a little better but his balance is still a problem.   Vitals monitored throughout session.   Pt educated throughout session about proper posture and technique with exercises. Improved exercise technique, movement at target joints, use of target muscles after min to mod verbal, visual, tactile cues  Patient's goals performed 07/13/19, please refer to this note for details on progress. Today's session focused on stability with patient demonstrating limited ability to perform single limb stance on RLE.  Patient's condition has the potential to improve in response to therapy. Maximum improvement is yet to be obtained. The anticipated improvement is attainable and reasonable in a generally predictable time. Pt would benefit from skilled PT intervention for improvements in strength, balance, and gait  safety                    PT Education - 07/20/19 1736    Education provided  Yes    Education Details  exercise technique, body  mechanics    Person(s) Educated  Patient    Methods  Explanation;Demonstration;Tactile cues;Verbal cues    Comprehension  Verbalized understanding;Returned demonstration;Verbal cues required;Tactile cues required       PT Short Term Goals - 07/14/19 0839      PT SHORT TERM GOAL #1   Title  Patient will be independent in home exercise program to improve strength/mobility for better functional independence with ADLs.    Baseline  11/4: HEP given 1/7: reports compliance    Time  4    Period  Weeks    Status  Achieved    Target Date  06/08/19  PT Long Term Goals - 07/14/19 0839      PT LONG TERM GOAL #1   Title  Patient will demonstrate an improved Berg Balance Score of >40/56 as to demonstrate improved balance with ADLs such as sitting/standing and transfer balance and reduced fall risk    Baseline  11/4: 34/56 1/7: 37/56    Time  8    Period  Weeks    Status  Partially Met    Target Date  09/07/19      PT LONG TERM GOAL #2   Title  Patient will increase 10 meter walk test to >1.33ms as to improve gait speed for better community ambulation and to reduce fall risk    Baseline  11/4: 0.31 m/s with quad cane 1/7: 0.625 m/s holding cane above ground    Time  8    Period  Weeks    Status  Partially Met    Target Date  09/07/19      PT LONG TERM GOAL #3   Title  Patient will increase BLE gross strength to 4+/5 as to improve functional strength for independent gait, increased standing tolerance and increased ADL ability.    Baseline  11/4: L: grossly 4-/5 R grossly 3+/5 with hip add/ext 3/5 1/6; L grossly 4/5 R 4/5 hip add/ext 4-/5    Time  8    Period  Weeks    Status  Partially Met    Target Date  09/07/19      PT LONG TERM GOAL #4   Title  Patient will increase six minute walk test distance to >800 for progression to  community ambulator and improve gait ability    Baseline  11/4: unable to perform 1/6: 662 ft with quad cane and significant R foot shuffle    Time  8    Period  Weeks    Status  Partially Met    Target Date  09/07/19            Plan - 07/20/19 1744    Clinical Impression Statement  Patient's goals performed 07/13/19, please refer to this note for details on progress. Today's session focused on stability with patient demonstrating limited ability to perform single limb stance on RLE.  Patient's condition has the potential to improve in response to therapy. Maximum improvement is yet to be obtained. The anticipated improvement is attainable and reasonable in a generally predictable time. 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 Evolving/Moderate complexity    Rehab Potential  Fair    PT Frequency  2x / week    PT Duration  8 weeks    PT Treatment/Interventions  ADLs/Self Care Home Management;Electrical Stimulation;Therapeutic activities;Functional mobility training;Stair training;Gait training;DME Instruction;Therapeutic exercise;Balance training;Neuromuscular re-education;Patient/family education;Manual techniques;Passive range of motion;Energy conservation;Taping;Aquatic Therapy;Biofeedback;Cryotherapy;Moist Heat;Traction;Ultrasound;Orthotic Fit/Training    PT Next Visit Plan  balance    PT Home Exercise Plan  see above     Consulted and Agree with Plan of Care  Patient;Family member/caregiver    Family Member Consulted  son       Patient will benefit from skilled therapeutic intervention in order  to improve the following deficits and impairments:  Abnormal gait, Decreased activity tolerance, Decreased balance, Decreased knowledge of precautions, Decreased endurance, Decreased knowledge of use of DME, Decreased mobility, Decreased safety awareness, Difficulty walking, Decreased strength, Improper body mechanics, Decreased coordination, Impaired perceived functional ability, Impaired flexibility, Impaired vision/preception, Postural dysfunction, Pain  Visit Diagnosis: Other abnormalities of gait and mobility  Muscle weakness (generalized)  Unsteadiness on feet     Problem List Patient Active Problem List   Diagnosis Date Noted  . Hypertensive urgency 12/31/2016  . Stroke ([place]) 10/15/2016  . TIA (transient ischemic attack) 03/07/2016  . Acute CVA (cerebrovascular accident) ([place]) 07/03/2015   Janna Arch, PT, DPT   07/20/2019, 5:45 PM  [place] MAIN [facility] SERVICES [address] [place], [place], [address] Phone: [phone]   Fax:  [phone]  Name: Jamai Dolce MRN: [id number] Date of Birth: June 25, 1926

## 2019-07-21 ENCOUNTER — Ambulatory Visit: Payer: Medicare HMO

## 2019-07-21 ENCOUNTER — Other Ambulatory Visit: Payer: Self-pay

## 2019-07-21 DIAGNOSIS — M6281 Muscle weakness (generalized): Secondary | ICD-10-CM

## 2019-07-21 DIAGNOSIS — R2681 Unsteadiness on feet: Secondary | ICD-10-CM

## 2019-07-21 DIAGNOSIS — R2689 Other abnormalities of gait and mobility: Secondary | ICD-10-CM | POA: Diagnosis not present

## 2019-07-21 NOTE — Therapy (Signed)
[place] MAIN [facility] SERVICES [address] [place], [place], [address] Phone: [phone]   Fax:  [phone]  Physical Therapy Treatment  Patient Details  Name: Christopher [name] MRN: [id number] Date of Birth: [birth year] Referring Provider (PT): Dr. Chryl Heck   Encounter Date: 07/21/2019  PT End of Session - 07/21/19 1731    Visit Number  11    Number of Visits  25    Date for PT Re-Evaluation  09/07/19    Authorization Type  1/10 Pn 07/20/19    PT Start Time  1606    PT Stop Time  1647    PT Time Calculation (min)  41 min    Equipment Utilized During Treatment  Gait belt    Activity Tolerance  Patient tolerated treatment well;Other (comment)   comprehension   Behavior During Therapy  Flat affect;WFL for tasks assessed/performed       Past Medical History:  Diagnosis Date  . Arthritis   . Ataxia   . Diabetes mellitus without complication ([place])    controlled, pt checks it in the morning   . Dyslipidemia   . Hypertension    somewhat controlled, pt checks every morning, he reports it has been good.  . Stroke ([place])   . TIA (transient ischemic attack)     Past Surgical History:  Procedure Laterality Date  . none      There were no vitals filed for this visit.  Subjective Assessment - 07/21/19 1727    Subjective  Patient and son report no falls or LOB since last session. Has been compliant with HEP.    Patient is accompained by:  Family member   son is interpreter.   Pertinent History  Patient is a 86 year old male who presents for abnormal gait, worsening over the past 2-3 weeks. Patient's son present and states walking is hard and not walking much anymore.  He has been seen previously at this facility ~ 1 year ago and discharged due to plateau of progress. Since he was seen last patient has received home health PT per physician notes.  Recent MRI (02/04/19) shows minimal changes in brain since 2018 chronic lacunar infarcts,  bilateral Anterior Cerebral Artery hyper intensities, moderate white matter microvascular ischemic changes and atrophy of the brain. X ray of knee shows no acute findings with moderate medial compartment joint space narrowing and of hip showing no fracture, bone lesion or hip joint abnormality. Patient bathes and dresses self independently. 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 No falls reported. Want to use machines to get strength back per son. Patient is having less comprehension requiring son for translation.    Limitations  Lifting;Standing;Walking;House hold activities    How long can you sit comfortably?  n/a    How long can you stand comfortably?  as soon as stand.    How long can you walk comfortably?  across his house     Diagnostic tests  Recent MRI (02/04/19) shows minimal changes in brain since 2018 chronic lacunar infarcts, bilateral Anterior Cerebral Artery hyper intensities, moderate white matter microvascular ischemic changes and atrophy of the brain. X ray of knee shows no acute findings with moderate medial compartment joint space narrowing and of hip showing no fracture, bone lesion or hip joint abnormality.    Patient Stated Goals  get some strength back and walk better.    Currently in Pain?  No/denies  Vitals at start of session: 126/73 pulse 80 oxygen 99  Treatment: All standing interventions require close CGA due to instability and fall risk.   Sit to stand no UE support feet on airex pad, 10x  Sit to stand with 4" step under LLE, 10x ; focus on weight shift onto RLE Stand up ambulate 10 ft negotiate around cone and return to sitting x 2; max cueing for foot clearance bilaterally   Ambulate 38 ft x2 trials with CGA and quad cane, focus on weight shift and foot clearance.   In // bars: Side step lateral two orange hurdles 15x SUE support max cueing for sequencing 10x length  Step over half foam roller 15x each LE, SUE support Balloon taps  reaching inside/outside BOS with no UE support 60 seconds, progressed to standing on unstable surface (airex pad) for 3 minutes airex pad: saebo ball transfer: 2x each UE, progressed to tandem stance (modified-one foot on each color airex pad) 2x each UE  Side stepping length of // bars with forward ball chest press for dual task x 4 length of // bars, max cueing for proper mechanics Large step with overhead ball raises 6x length of // bars with max cueing for task orientation/sequencing  Seated: Rainbow ball between feet for adduction squeeze with LAQ 15x; challenging for patient to perform equal weight press  Vitals monitored throughout session. Pt educated throughout session about proper posture and technique with exercises. Improved exercise technique, movement at target joints, use of target muscles after min to mod verbal, visual, tactile cues                    PT Education - 07/21/19 1730    Education provided  Yes    Education Details  exercise technique, body mechanics    Person(s) Educated  Patient    Methods  Explanation;Demonstration;Tactile cues;Verbal cues    Comprehension  Verbalized understanding;Returned demonstration;Tactile cues required;Verbal cues required       PT Short Term Goals - 07/14/19 0839      PT SHORT TERM GOAL #1   Title  Patient will be independent in home exercise program to improve strength/mobility for better functional independence with ADLs.    Baseline  11/4: HEP given 1/7: reports compliance    Time  4    Period  Weeks    Status  Achieved    Target Date  06/08/19        PT Long Term Goals - 07/14/19 0839      PT LONG TERM GOAL #1   Title  Patient will demonstrate an improved Berg Balance Score of >40/56 as to demonstrate improved balance with ADLs such as sitting/standing and transfer balance and reduced fall risk    Baseline  11/4: 34/56 1/7: 37/56    Time  8    Period  Weeks    Status  Partially Met    Target  Date  09/07/19      PT LONG TERM GOAL #2   Title  Patient will increase 10 meter walk test to >1.28ms as to improve gait speed for better community ambulation and to reduce fall risk    Baseline  11/4: 0.31 m/s with quad cane 1/7: 0.625 m/s holding cane above ground    Time  8    Period  Weeks    Status  Partially Met    Target Date  09/07/19      PT LONG TERM GOAL #3   Title  Patient will increase BLE gross strength to 4+/5 as to improve functional strength for independent gait, increased standing tolerance and increased ADL ability.    Baseline  11/4: L: grossly 4-/5 R grossly 3+/5 with hip add/ext 3/5 1/6; L grossly 4/5 R 4/5 hip add/ext 4-/5    Time  8    Period  Weeks    Status  Partially Met    Target Date  09/07/19      PT LONG TERM GOAL #4   Title  Patient will increase six minute walk test distance to >800 for progression to community ambulator and improve gait ability    Baseline  11/4: unable to perform 1/6: 662 ft with quad cane and significant R foot shuffle    Time  8    Period  Weeks    Status  Partially Met    Target Date  09/07/19            Plan - 07/21/19 1734    Clinical Impression Statement  Patient is challenged with dual tasking with mobility with regression to shuffle stepping. Patient will continue to need focused interventions on dynamic stability on RLE and dual tasking for decreasing fall risk. Patient challenged with foot clearance with decreasing UE support as well as coordination of RLE. Max cueing requiring with demonstration for task performance. . Pt would benefit from skilled PT intervention for improvements in strength, balance, and gait safety    Personal Factors and Comorbidities  Age;Comorbidity 3+;Education;Past/Current Experience;Social Background;Time since onset of injury/illness/exacerbation    Comorbidities  Stroke, TIA, CVA, arthritis, dyslipedemia, HTN, DM, ataxia, needs help with interpreter    Examination-Activity Limitations   Hygiene/Grooming;Locomotion Level;Reach Overhead;Squat;Stairs;Stand;Transfers    Examination-Participation Restrictions  Cleaning;Community Activity;Laundry;Shop;Meal Prep;Yard Work    Merchant navy officer  Evolving/Moderate complexity    Rehab Potential  Fair    PT Frequency  2x / week    PT Duration  8 weeks    PT Treatment/Interventions  ADLs/Self Care Home Management;Electrical Stimulation;Therapeutic activities;Functional mobility training;Stair training;Gait training;DME Instruction;Therapeutic exercise;Balance training;Neuromuscular re-education;Patient/family education;Manual techniques;Passive range of motion;Energy conservation;Taping;Aquatic Therapy;Biofeedback;Cryotherapy;Moist Heat;Traction;Ultrasound;Orthotic Fit/Training    PT Next Visit Plan  balance    PT Home Exercise Plan  see above    Consulted and Agree with Plan of Care  Patient;Family member/caregiver    Family Member Consulted  son       Patient will benefit from skilled therapeutic intervention in order to improve the following deficits and impairments:  Abnormal gait, Decreased activity tolerance, Decreased balance, Decreased knowledge of precautions, Decreased endurance, Decreased knowledge of use of DME, Decreased mobility, Decreased safety awareness, Difficulty walking, Decreased strength, Improper body mechanics, Decreased coordination, Impaired perceived functional ability, Impaired flexibility, Impaired vision/preception, Postural dysfunction, Pain  Visit Diagnosis: Other abnormalities of gait and mobility  Muscle weakness (generalized)  Unsteadiness on feet     Problem List Patient Active Problem List   Diagnosis Date Noted  . Hypertensive urgency 12/31/2016  . Stroke ([place]) 10/15/2016  . TIA (transient ischemic attack) 03/07/2016  . Acute CVA (cerebrovascular accident) ([place]) 07/03/2015   Janna Arch, PT, DPT   07/21/2019, 5:37 PM  [place] MAIN  [facility] SERVICES [address] [place], [place], [address] Phone: [phone]   Fax:  [phone]  Name: Christopher [name] MRN: [id number] Date of Birth: 09/25/1925

## 2019-07-27 ENCOUNTER — Ambulatory Visit: Payer: Medicare HMO

## 2019-07-27 ENCOUNTER — Other Ambulatory Visit: Payer: Self-pay

## 2019-07-27 DIAGNOSIS — R2681 Unsteadiness on feet: Secondary | ICD-10-CM

## 2019-07-27 DIAGNOSIS — Z9181 History of falling: Secondary | ICD-10-CM

## 2019-07-27 DIAGNOSIS — R2689 Other abnormalities of gait and mobility: Secondary | ICD-10-CM | POA: Diagnosis not present

## 2019-07-27 DIAGNOSIS — M6281 Muscle weakness (generalized): Secondary | ICD-10-CM

## 2019-07-27 DIAGNOSIS — R471 Dysarthria and anarthria: Secondary | ICD-10-CM

## 2019-07-27 DIAGNOSIS — R269 Unspecified abnormalities of gait and mobility: Secondary | ICD-10-CM

## 2019-07-27 NOTE — Therapy (Signed)
[place] MAIN [facility] SERVICES [address] [place], [place], [address] Phone: [phone]   Fax:  [phone]  Physical Therapy Treatment  Patient Details  Name: Christopher [name] MRN: [id number] Date of Birth: 08/23/1925 Referring Provider (PT): Dr. Chryl Heck   Encounter Date: 07/27/2019    Past Medical History:  Diagnosis Date  . Arthritis   . Ataxia   . Diabetes mellitus without complication ([place])    controlled, pt checks it in the morning   . Dyslipidemia   . Hypertension    somewhat controlled, pt checks every morning, he reports it has been good.  . Stroke ([place])   . TIA (transient ischemic attack)     Past Surgical History:  Procedure Laterality Date  . none      There were no vitals filed for this visit.  Subjective Assessment - 07/27/19 1603    Subjective  Patient and son report no falls or stumbles, denies pain    Patient is accompained by:  Family member   son   Pertinent History  Patient is a 86 year old male who presents for abnormal gait, worsening over the past 2-3 weeks. Patient's son present and states walking is hard and not walking much anymore.  He has been seen previously at this facility ~ 1 year ago and discharged due to plateau of progress. Since he was seen last patient has received home health PT per physician notes.  Recent MRI (02/04/19) shows minimal changes in brain since 2018 chronic lacunar infarcts, bilateral Anterior Cerebral Artery hyper intensities, moderate white matter microvascular ischemic changes and atrophy of the brain. X ray of knee shows no acute findings with moderate medial compartment joint space narrowing and of hip showing no fracture, bone lesion or hip joint abnormality. Patient bathes and dresses self independently. 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 No falls reported. Want to use machines to get strength back per son. Patient is having less comprehension requiring son  for translation.    Limitations  Lifting;Standing;Walking;House hold activities    How long can you sit comfortably?  n/a    How long can you stand comfortably?  as soon as stand.    How long can you walk comfortably?  across his house     Diagnostic tests  Recent MRI (02/04/19) shows minimal changes in brain since 2018 chronic lacunar infarcts, bilateral Anterior Cerebral Artery hyper intensities, moderate white matter microvascular ischemic changes and atrophy of the brain. X ray of knee shows no acute findings with moderate medial compartment joint space narrowing and of hip showing no fracture, bone lesion or hip joint abnormality.    Patient Stated Goals  get some strength back and walk better.    Currently in Pain?  No/denies       Vitals at start of session and after session WFLs   Treatment: All standing interventions require close CGA due to instability and fall risk.  Sit to stand no UE support feet on airex pad, 10x  Stand up ambulate 15 ft negotiate around cone and return to sitting x 2 of two laps carries quad cane, switches hands with quad cane, second lap without cane (around cones figure 8)    In // bars: Side step lateral two orange hurdles 15x SUE support max cueing for sequencing 10x length  Step over 2 half  foam roller 15x each LE, SUE support airex pad: saebo ball transfer: 2x each UE, in one  step forward position ea foot on foam pad. PT experienced L lateral lean, modA from PT to correct, significant cueing and repositioning of feet for patient to maintain balance with CGA Step taps to 6" step from airex foam x12 ea leg no UE support LAQ with rainbow ball squeezes x10 bilaterally   Vitals monitored throughout session.     Pt educated throughout session about proper posture and technique with exercises. Improved exercise technique, movement at target joints, use of target muscles after min to mod verbal, visual, tactile cues  Pt response/clinical impression: Patient  needed cueing for safety during session, 2-3 times pt seemed unaware of PT provided assistance to maintain balance. Able to correct with repositioning of feet and extensive tactile/verbal cueing. The patient would benefit from further skilled PT intervention to maximize mobility, safety, and independence.     PT Education - 07/27/19 1604    Education provided  Yes    Education Details  exercise teichnique/body mechanis    Person(s) Educated  Patient    Methods  Explanation;Demonstration;Verbal cues;Tactile cues    Comprehension  Verbalized understanding       PT Short Term Goals - 07/14/19 0839      PT SHORT TERM GOAL #1   Title  Patient will be independent in home exercise program to improve strength/mobility for better functional independence with ADLs.    Baseline  11/4: HEP given 1/7: reports compliance    Time  4    Period  Weeks    Status  Achieved    Target Date  06/08/19        PT Long Term Goals - 07/14/19 0839      PT LONG TERM GOAL #1   Title  Patient will demonstrate an improved Berg Balance Score of >40/56 as to demonstrate improved balance with ADLs such as sitting/standing and transfer balance and reduced fall risk    Baseline  11/4: 34/56 1/7: 37/56    Time  8    Period  Weeks    Status  Partially Met    Target Date  09/07/19      PT LONG TERM GOAL #2   Title  Patient will increase 10 meter walk test to >1.44ms as to improve gait speed for better community ambulation and to reduce fall risk    Baseline  11/4: 0.31 m/s with quad cane 1/7: 0.625 m/s holding cane above ground    Time  8    Period  Weeks    Status  Partially Met    Target Date  09/07/19      PT LONG TERM GOAL #3   Title  Patient will increase BLE gross strength to 4+/5 as to improve functional strength for independent gait, increased standing tolerance and increased ADL ability.    Baseline  11/4: L: grossly 4-/5 R grossly 3+/5 with hip add/ext 3/5 1/6; L grossly 4/5 R 4/5 hip add/ext 4-/5     Time  8    Period  Weeks    Status  Partially Met    Target Date  09/07/19      PT LONG TERM GOAL #4   Title  Patient will increase six minute walk test distance to >800 for progression to community ambulator and improve gait ability    Baseline  11/4: unable to perform 1/6: 662 ft with quad cane and significant R foot shuffle    Time  8    Period  Weeks    Status  Partially  Met    Target Date  09/07/19            Plan - 07/28/19 1154    Personal Factors and Comorbidities  Age;Comorbidity 3+;Education;Past/Current Experience;Social Background;Time since onset of injury/illness/exacerbation    Comorbidities  Stroke, TIA, CVA, arthritis, dyslipedemia, HTN, DM, ataxia, needs help with interpreter    Examination-Activity Limitations  Hygiene/Grooming;Locomotion Level;Reach Overhead;Squat;Stairs;Stand;Transfers    Rehab Potential  Fair    PT Frequency  2x / week    PT Duration  8 weeks    PT Treatment/Interventions  ADLs/Self Care Home Management;Electrical Stimulation;Therapeutic activities;Functional mobility training;Stair training;Gait training;DME Instruction;Therapeutic exercise;Balance training;Neuromuscular re-education;Patient/family education;Manual techniques;Passive range of motion;Energy conservation;Taping;Aquatic Therapy;Biofeedback;Cryotherapy;Moist Heat;Traction;Ultrasound;Orthotic Fit/Training    PT Next Visit Plan  balance    PT Home Exercise Plan  see above    Consulted and Agree with Plan of Care  Patient;Family member/caregiver    Family Member Consulted  son       Patient will benefit from skilled therapeutic intervention in order to improve the following deficits and impairments:  Abnormal gait, Decreased activity tolerance, Decreased balance, Decreased knowledge of precautions, Decreased endurance, Decreased knowledge of use of DME, Decreased mobility, Decreased safety awareness, Difficulty walking, Decreased strength, Improper body mechanics, Decreased  coordination, Impaired perceived functional ability, Impaired flexibility, Impaired vision/preception, Postural dysfunction, Pain  Visit Diagnosis: Other abnormalities of gait and mobility  Dysarthria and anarthria  Muscle weakness (generalized)  History of fall  Unsteadiness on feet  Abnormality of gait     Problem List Patient Active Problem List   Diagnosis Date Noted  . Hypertensive urgency 12/31/2016  . Stroke ([place]) 10/15/2016  . TIA (transient ischemic attack) 03/07/2016  . Acute CVA (cerebrovascular accident) ([place]) 07/03/2015    Lieutenant Diego PT, DPT 11:58 AM,07/28/19   [place] MAIN [facility] SERVICES [address] [place], [place], [address] Phone: [phone]   Fax:  [phone]  Name: Delvis Kau MRN: [id number] Date of Birth: 1926-05-15

## 2019-07-28 ENCOUNTER — Ambulatory Visit: Payer: Medicare HMO

## 2019-07-28 ENCOUNTER — Other Ambulatory Visit: Payer: Self-pay

## 2019-07-28 DIAGNOSIS — R2689 Other abnormalities of gait and mobility: Secondary | ICD-10-CM

## 2019-07-28 DIAGNOSIS — M6281 Muscle weakness (generalized): Secondary | ICD-10-CM

## 2019-07-28 DIAGNOSIS — Z9181 History of falling: Secondary | ICD-10-CM

## 2019-07-28 DIAGNOSIS — R2681 Unsteadiness on feet: Secondary | ICD-10-CM

## 2019-07-28 NOTE — Therapy (Signed)
[place] MAIN [facility] SERVICES [address] [place], [place], [address] Phone: [phone]   Fax:  [phone]  Physical Therapy Treatment  Patient Details  Name: Christopher [name] MRN: [id number] Date of Birth: 04/23/1926 Referring Provider (PT): Dr. Chryl Heck   Encounter Date: 07/28/2019  PT End of Session - 07/28/19 1648    Visit Number  13    Number of Visits  25    Date for PT Re-Evaluation  09/07/19    Authorization Type  3/10 PN    PT Start Time  1601    PT Stop Time  1645    PT Time Calculation (min)  44 min    Equipment Utilized During Treatment  Gait belt    Activity Tolerance  Patient tolerated treatment well;Other (comment)   comprehension   Behavior During Therapy  Flat affect;WFL for tasks assessed/performed       Past Medical History:  Diagnosis Date  . Arthritis   . Ataxia   . Diabetes mellitus without complication ([place])    controlled, pt checks it in the morning   . Dyslipidemia   . Hypertension    somewhat controlled, pt checks every morning, he reports it has been good.  . Stroke ([place])   . TIA (transient ischemic attack)     Past Surgical History:  Procedure Laterality Date  . none      There were no vitals filed for this visit.  Subjective Assessment - 07/28/19 1604    Subjective  Patient presents with son. No falls or LOB since last session.    Patient is accompained by:  Family member   son   Pertinent History  Patient is a 86 year old male who presents for abnormal gait, worsening over the past 2-3 weeks. Patient's son present and states walking is hard and not walking much anymore.  He has been seen previously at this facility ~ 1 year ago and discharged due to plateau of progress. Since he was seen last patient has received home health PT per physician notes.  Recent MRI (02/04/19) shows minimal changes in brain since 2018 chronic lacunar infarcts, bilateral Anterior Cerebral Artery hyper intensities,  moderate white matter microvascular ischemic changes and atrophy of the brain. X ray of knee shows no acute findings with moderate medial compartment joint space narrowing and of hip showing no fracture, bone lesion or hip joint abnormality. Patient bathes and dresses self independently. 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 No falls reported. Want to use machines to get strength back per son. Patient is having less comprehension requiring son for translation.    Limitations  Lifting;Standing;Walking;House hold activities    How long can you sit comfortably?  n/a    How long can you stand comfortably?  as soon as stand.    How long can you walk comfortably?  across his house     Diagnostic tests  Recent MRI (02/04/19) shows minimal changes in brain since 2018 chronic lacunar infarcts, bilateral Anterior Cerebral Artery hyper intensities, moderate white matter microvascular ischemic changes and atrophy of the brain. X ray of knee shows no acute findings with moderate medial compartment joint space narrowing and of hip showing no fracture, bone lesion or hip joint abnormality.    Patient Stated Goals  get some strength back and walk better.    Currently in Pain?  No/denies      Vitals at start of session Pulse 78 spo2 98% 126/68  Treatment: All standing interventions require close CGA due to instability and fall risk.   Weave between 7 cones no AD x 4 trials; cueing for picking up feet for decreased shuffle steps  Side  Stepping 10x 8x length of // bars improved foot clearance with tactile and verbal cueing.   10x STS no UE support cueing for body mechanics   Ambulate 38 ft x2 trials with CGA and no AD, focus on weight shift and foot clearance.    In // bars: airex pad 6" step toe taps 20x, with decrease from SUE to no UE support airex pad: tandem stance: 5lb bar chest press , overhead raise, 10x each position x 2 sets each position bosu ball forward modified lunges 15x each  LE SUE support bosu ball lateral modified lunges 15x each LE, SUE support airex balance beam tandem walk with SUE support 6x length of // bars Tilt board forward/ backward 10x each LE placement 2 sets each placement.       Vitals monitored throughout session.    Pt educated throughout session about proper posture and technique with exercises. Improved exercise technique, movement at target joints, use of target muscles after min to mod verbal, visual, tactile cues                   PT Education - 07/28/19 1604    Education provided  Yes    Education Details  exercise technique, body mechanics    Person(s) Educated  Patient    Methods  Explanation;Demonstration;Tactile cues;Verbal cues    Comprehension  Verbalized understanding;Returned demonstration;Verbal cues required;Tactile cues required       PT Short Term Goals - 07/14/19 0839      PT SHORT TERM GOAL #1   Title  Patient will be independent in home exercise program to improve strength/mobility for better functional independence with ADLs.    Baseline  11/4: HEP given 1/7: reports compliance    Time  4    Period  Weeks    Status  Achieved    Target Date  06/08/19        PT Long Term Goals - 07/14/19 0839      PT LONG TERM GOAL #1   Title  Patient will demonstrate an improved Berg Balance Score of >40/56 as to demonstrate improved balance with ADLs such as sitting/standing and transfer balance and reduced fall risk    Baseline  11/4: 34/56 1/7: 37/56    Time  8    Period  Weeks    Status  Partially Met    Target Date  09/07/19      PT LONG TERM GOAL #2   Title  Patient will increase 10 meter walk test to >1.32ms as to improve gait speed for better community ambulation and to reduce fall risk    Baseline  11/4: 0.31 m/s with quad cane 1/7: 0.625 m/s holding cane above ground    Time  8    Period  Weeks    Status  Partially Met    Target Date  09/07/19      PT LONG TERM GOAL #3   Title  Patient  will increase BLE gross strength to 4+/5 as to improve functional strength for independent gait, increased standing tolerance and increased ADL ability.    Baseline  11/4: L: grossly 4-/5 R grossly 3+/5 with hip add/ext 3/5 1/6; L grossly 4/5 R 4/5 hip add/ext 4-/5    Time  8  Period  Weeks    Status  Partially Met    Target Date  09/07/19      PT LONG TERM GOAL #4   Title  Patient will increase six minute walk test distance to >800 for progression to community ambulator and improve gait ability    Baseline  11/4: unable to perform 1/6: 662 ft with quad cane and significant R foot shuffle    Time  8    Period  Weeks    Status  Partially Met    Target Date  09/07/19            Plan - 07/28/19 1649    Clinical Impression Statement  Patient continues to progress with dynamic and static stability. He is challenged with dual task interventions requiring UE and LE demands. He continues to be challenged with single limb stability and foot clearance with fatigue. The patient would benefit from further skilled PT intervention to maximize mobility, safety, and independence    Personal Factors and Comorbidities  Age;Comorbidity 3+;Education;Past/Current Experience;Social Background;Time since onset of injury/illness/exacerbation    Comorbidities  Stroke, TIA, CVA, arthritis, dyslipedemia, HTN, DM, ataxia, needs help with interpreter    Examination-Activity Limitations  Hygiene/Grooming;Locomotion Level;Reach Overhead;Squat;Stairs;Stand;Transfers    Examination-Participation Restrictions  Cleaning;Community Activity;Laundry;Shop;Meal Prep;Yard Work    [employer] Potential  Fair    PT Frequency  2x / week    PT Duration  8 weeks    PT Treatment/Interventions  ADLs/Self Care Home Management;Electrical Stimulation;Therapeutic activities;Functional mobility training;Stair training;Gait training;DME Instruction;Therapeutic exercise;Balance training;Neuromuscular re-education;Patient/family  education;Manual techniques;Passive range of motion;Energy conservation;Taping;Aquatic Therapy;Biofeedback;Cryotherapy;Moist Heat;Traction;Ultrasound;Orthotic Fit/Training    PT Next Visit Plan  balance    PT Home Exercise Plan  see above    Consulted and Agree with Plan of Care  Patient;Family member/caregiver    Family Member Consulted  son       Patient will benefit from skilled therapeutic intervention in order to improve the following deficits and impairments:  Abnormal gait, Decreased activity tolerance, Decreased balance, Decreased knowledge of precautions, Decreased endurance, Decreased knowledge of use of DME, Decreased mobility, Decreased safety awareness, Difficulty walking, Decreased strength, Improper body mechanics, Decreased coordination, Impaired perceived functional ability, Impaired flexibility, Impaired vision/preception, Postural dysfunction, Pain  Visit Diagnosis: Other abnormalities of gait and mobility  Muscle weakness (generalized)  History of fall  Unsteadiness on feet     Problem List Patient Active Problem List   Diagnosis Date Noted  . Hypertensive urgency 12/31/2016  . Stroke ([place]) 10/15/2016  . TIA (transient ischemic attack) 03/07/2016  . Acute CVA (cerebrovascular accident) ([place]) 07/03/2015   Janna Arch, PT, DPT   07/28/2019, 4:50 PM  [place] MAIN [facility] SERVICES [address] [place], [place], [address] Phone: [phone]   Fax:  [phone]  Name: Menashe Kafer MRN: [id number] Date of Birth: [birth year]

## 2019-08-03 ENCOUNTER — Other Ambulatory Visit: Payer: Self-pay

## 2019-08-03 ENCOUNTER — Ambulatory Visit: Payer: Medicare HMO

## 2019-08-03 DIAGNOSIS — R2689 Other abnormalities of gait and mobility: Secondary | ICD-10-CM

## 2019-08-03 DIAGNOSIS — M6281 Muscle weakness (generalized): Secondary | ICD-10-CM

## 2019-08-03 DIAGNOSIS — R2681 Unsteadiness on feet: Secondary | ICD-10-CM

## 2019-08-03 NOTE — Therapy (Signed)
[place] MAIN [facility] SERVICES [address] [place], [place], [address] Phone: [phone]   Fax:  [phone]  Physical Therapy Treatment  Patient Details  Name: Christopher [name] MRN: [id number] Date of Birth: 10-21-1925 Referring Provider (PT): Dr. Chryl Heck   Encounter Date: 08/03/2019  PT End of Session - 08/04/19 1102    Visit Number  14    Number of Visits  25    Date for PT Re-Evaluation  09/07/19    Authorization Type  4/10 PN    PT Start Time  1609    PT Stop Time  1645    PT Time Calculation (min)  36 min    Equipment Utilized During Treatment  Gait belt    Activity Tolerance  Patient tolerated treatment well;Other (comment)   comprehension   Behavior During Therapy  Flat affect;WFL for tasks assessed/performed       Past Medical History:  Diagnosis Date  . Arthritis   . Ataxia   . Diabetes mellitus without complication ([place])    controlled, pt checks it in the morning   . Dyslipidemia   . Hypertension    somewhat controlled, pt checks every morning, he reports it has been good.  . Stroke ([place])   . TIA (transient ischemic attack)     Past Surgical History:  Procedure Laterality Date  . none      There were no vitals filed for this visit.  Subjective Assessment - 08/04/19 1101    Subjective  Patient presents late to PT session with son. Reports being a little tired but no falls or LOB, has been taking his medication.    Patient is accompained by:  Family member   son   Pertinent History  Patient is a 86 year old male who presents for abnormal gait, worsening over the past 2-3 weeks. Patient's son present and states walking is hard and not walking much anymore.  He has been seen previously at this facility ~ 1 year ago and discharged due to plateau of progress. Since he was seen last patient has received home health PT per physician notes.  Recent MRI (02/04/19) shows minimal changes in brain since 2018 chronic lacunar  infarcts, bilateral Anterior Cerebral Artery hyper intensities, moderate white matter microvascular ischemic changes and atrophy of the brain. X ray of knee shows no acute findings with moderate medial compartment joint space narrowing and of hip showing no fracture, bone lesion or hip joint abnormality. Patient bathes and dresses self independently. 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 No falls reported. Want to use machines to get strength back per son. Patient is having less comprehension requiring son for translation.    Limitations  Lifting;Standing;Walking;House hold activities    How long can you sit comfortably?  n/a    How long can you stand comfortably?  as soon as stand.    How long can you walk comfortably?  across his house     Diagnostic tests  Recent MRI (02/04/19) shows minimal changes in brain since 2018 chronic lacunar infarcts, bilateral Anterior Cerebral Artery hyper intensities, moderate white matter microvascular ischemic changes and atrophy of the brain. X ray of knee shows no acute findings with moderate medial compartment joint space narrowing and of hip showing no fracture, bone lesion or hip joint abnormality.    Patient Stated Goals  get some strength back and walk better.    Currently in Pain?  No/denies  Treatment:   airex pad STS 10x with arms crossed, instability upon standing, requires close contact guard with occasional knee buckling.   Tapping 3 cones in front and lateral to patient to challenge single limb stability, Patient requires SUE support and is very challenged with task with frequent knocking over of cones, 6x each LE  Object negotiation/room negotiation training: sit to stand from chair ambulate 10 ft walk around cone and return to seated position x 8 trials with CGA and max cueing for increasing step height for reduction of shuffle.  BP after ambulation: 135/71 pulse 84    airex pad 7" step forward taps two fingers 15x each  LE airex pad 7" step lateral taps two fingers  15x each LE,   Speed ladder: one foot each square for widened step length 3x, terminated due to excessive L knee buckling with weight acceptance.   5lb ankle weight:  -LAQ 10x 3 second hold  -straight leg abduction/adduction 10x -df/pf with rainbow ball between knees15x  Rainbow ball adduction between feet with LAQ 15x very challenging.     Vitals monitored throughout session.  Pt educated throughout session about proper posture and technique with exercises. Improved exercise technique, movement at target joints, use of target muscles after min to mod verbal, visual, tactile cues                PT Education - 08/04/19 1102    Education provided  Yes    Education Details  exercise technique, body mechanics    Person(s) Educated  Patient    Methods  Explanation;Demonstration;Tactile cues;Verbal cues    Comprehension  Verbalized understanding;Returned demonstration;Verbal cues required;Tactile cues required       PT Short Term Goals - 07/14/19 0839      PT SHORT TERM GOAL #1   Title  Patient will be independent in home exercise program to improve strength/mobility for better functional independence with ADLs.    Baseline  11/4: HEP given 1/7: reports compliance    Time  4    Period  Weeks    Status  Achieved    Target Date  06/08/19        PT Long Term Goals - 07/14/19 0839      PT LONG TERM GOAL #1   Title  Patient will demonstrate an improved Berg Balance Score of >40/56 as to demonstrate improved balance with ADLs such as sitting/standing and transfer balance and reduced fall risk    Baseline  11/4: 34/56 1/7: 37/56    Time  8    Period  Weeks    Status  Partially Met    Target Date  09/07/19      PT LONG TERM GOAL #2   Title  Patient will increase 10 meter walk test to >1.28ms as to improve gait speed for better community ambulation and to reduce fall risk    Baseline  11/4: 0.31 m/s with quad cane  1/7: 0.625 m/s holding cane above ground    Time  8    Period  Weeks    Status  Partially Met    Target Date  09/07/19      PT LONG TERM GOAL #3   Title  Patient will increase BLE gross strength to 4+/5 as to improve functional strength for independent gait, increased standing tolerance and increased ADL ability.    Baseline  11/4: L: grossly 4-/5 R grossly 3+/5 with hip add/ext 3/5 1/6; L grossly 4/5 R 4/5 hip add/ext 4-/5  Time  8    Period  Weeks    Status  Partially Met    Target Date  09/07/19      PT LONG TERM GOAL #4   Title  Patient will increase six minute walk test distance to >800 for progression to community ambulator and improve gait ability    Baseline  11/4: unable to perform 1/6: 662 ft with quad cane and significant R foot shuffle    Time  8    Period  Weeks    Status  Partially Met    Target Date  09/07/19            Plan - 08/04/19 1115    Clinical Impression Statement  Patient's session is limited by late arrival of patient and fatigue. Patient has increased episodes of knee buckling resulting in increased instability. Patient is challenged with foot clearance bilaterally with regression to shuffle step with fatigue. The patient would benefit from further skilled PT intervention to maximize mobility, safety, and independence    Personal Factors and Comorbidities  Age;Comorbidity 3+;Education;Past/Current Experience;Social Background;Time since onset of injury/illness/exacerbation    Comorbidities  Stroke, TIA, CVA, arthritis, dyslipedemia, HTN, DM, ataxia, needs help with interpreter    Examination-Activity Limitations  Hygiene/Grooming;Locomotion Level;Reach Overhead;Squat;Stairs;Stand;Transfers    Examination-Participation Restrictions  Cleaning;Community Activity;Laundry;Shop;Meal Prep;Yard Work    [employer] Potential  Fair    PT Frequency  2x / week    PT Duration  8 weeks    PT Treatment/Interventions  ADLs/Self Care Home Management;Electrical  Stimulation;Therapeutic activities;Functional mobility training;Stair training;Gait training;DME Instruction;Therapeutic exercise;Balance training;Neuromuscular re-education;Patient/family education;Manual techniques;Passive range of motion;Energy conservation;Taping;Aquatic Therapy;Biofeedback;Cryotherapy;Moist Heat;Traction;Ultrasound;Orthotic Fit/Training    PT Next Visit Plan  balance    PT Home Exercise Plan  see above    Consulted and Agree with Plan of Care  Patient;Family member/caregiver    Family Member Consulted  son       Patient will benefit from skilled therapeutic intervention in order to improve the following deficits and impairments:  Abnormal gait, Decreased activity tolerance, Decreased balance, Decreased knowledge of precautions, Decreased endurance, Decreased knowledge of use of DME, Decreased mobility, Decreased safety awareness, Difficulty walking, Decreased strength, Improper body mechanics, Decreased coordination, Impaired perceived functional ability, Impaired flexibility, Impaired vision/preception, Postural dysfunction, Pain  Visit Diagnosis: Other abnormalities of gait and mobility  Muscle weakness (generalized)  Unsteadiness on feet     Problem List Patient Active Problem List   Diagnosis Date Noted  . Hypertensive urgency 12/31/2016  . Stroke ([place]) 10/15/2016  . TIA (transient ischemic attack) 03/07/2016  . Acute CVA (cerebrovascular accident) ([place]) 07/03/2015   Janna Arch, PT, DPT   08/04/2019, 11:16 AM  [place] MAIN [facility] SERVICES [address] [place], [place], [address] Phone: [phone]   Fax:  [phone]  Name: Christopher [name] MRN: [id number] Date of Birth: 1925/09/29

## 2019-08-04 ENCOUNTER — Ambulatory Visit: Payer: Medicare HMO

## 2019-08-10 ENCOUNTER — Ambulatory Visit: Payer: Medicare HMO

## 2019-08-11 ENCOUNTER — Ambulatory Visit: Payer: Medicare HMO | Admitting: Physical Therapy

## 2019-08-17 ENCOUNTER — Ambulatory Visit: Payer: Medicare HMO

## 2019-08-18 ENCOUNTER — Ambulatory Visit: Payer: Medicare HMO | Admitting: Physical Therapy

## 2019-08-24 ENCOUNTER — Ambulatory Visit: Payer: Medicare HMO | Attending: Family Medicine

## 2019-08-24 ENCOUNTER — Other Ambulatory Visit: Payer: Self-pay

## 2019-08-24 DIAGNOSIS — M6281 Muscle weakness (generalized): Secondary | ICD-10-CM | POA: Insufficient documentation

## 2019-08-24 DIAGNOSIS — R2689 Other abnormalities of gait and mobility: Secondary | ICD-10-CM | POA: Diagnosis not present

## 2019-08-24 DIAGNOSIS — Z9181 History of falling: Secondary | ICD-10-CM | POA: Insufficient documentation

## 2019-08-24 DIAGNOSIS — R2681 Unsteadiness on feet: Secondary | ICD-10-CM | POA: Insufficient documentation

## 2019-08-24 NOTE — Therapy (Signed)
[place] MAIN [facility] SERVICES [address] [place], [place], [address] Phone: [phone]   Fax:  [phone]  Physical Therapy Treatment  Patient Details  Name: Christopher [name] MRN: [id number] Date of Birth: October 16, 1925 Referring Provider (PT): Dr. Chryl Heck   Encounter Date: 08/24/2019  PT End of Session - 08/24/19 2058    Visit Number  15    Number of Visits  25    Date for PT Re-Evaluation  09/07/19    Authorization Type  5/10 PN    PT Start Time  1602    PT Stop Time  1645    PT Time Calculation (min)  43 min    Equipment Utilized During Treatment  Gait belt    Activity Tolerance  Patient tolerated treatment well;Other (comment);Patient limited by fatigue   comprehension   Behavior During Therapy  Flat affect;WFL for tasks assessed/performed       Past Medical History:  Diagnosis Date  . Arthritis   . Ataxia   . Diabetes mellitus without complication ([place])    controlled, pt checks it in the morning   . Dyslipidemia   . Hypertension    somewhat controlled, pt checks every morning, he reports it has been good.  . Stroke ([place])   . TIA (transient ischemic attack)     Past Surgical History:  Procedure Laterality Date  . none      There were no vitals filed for this visit.  Subjective Assessment - 08/24/19 2053    Subjective  Patient and patient's son presented to therapy for first time in ~ 3 weeks, reports no falls.    Patient is accompained by:  Family member   son   Pertinent History  Patient is a 86 year old male who presents for abnormal gait, worsening over the past 2-3 weeks. Patient's son present and states walking is hard and not walking much anymore.  He has been seen previously at this facility ~ 1 year ago and discharged due to plateau of progress. Since he was seen last patient has received home health PT per physician notes.  Recent MRI (02/04/19) shows minimal changes in brain since 2018 chronic lacunar infarcts,  bilateral Anterior Cerebral Artery hyper intensities, moderate white matter microvascular ischemic changes and atrophy of the brain. X ray of knee shows no acute findings with moderate medial compartment joint space narrowing and of hip showing no fracture, bone lesion or hip joint abnormality. Patient bathes and dresses self independently. 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 No falls reported. Want to use machines to get strength back per son. Patient is having less comprehension requiring son for translation.    Limitations  Lifting;Standing;Walking;House hold activities    How long can you sit comfortably?  n/a    How long can you stand comfortably?  as soon as stand.    How long can you walk comfortably?  across his house     Diagnostic tests  Recent MRI (02/04/19) shows minimal changes in brain since 2018 chronic lacunar infarcts, bilateral Anterior Cerebral Artery hyper intensities, moderate white matter microvascular ischemic changes and atrophy of the brain. X ray of knee shows no acute findings with moderate medial compartment joint space narrowing and of hip showing no fracture, bone lesion or hip joint abnormality.    Patient Stated Goals  get some strength back and walk better.    Currently in Pain?  No/denies  Vitals at start of session 128/74 pulse 84  Treatment: All standing interventions require close CGA due to instability and fall risk.    Side Stepping 10x 8x length of // bars ; max cueing for foot clearance and upright posture,    10x STS no UE support cueing for body mechanics   Step over and back orange hurdle bilateral LE's SUE support; 12x each LE, max cueing for RLE due to challenge with coordination.   bosu ball forward modified lunges 15x each LE SUE support; right LE with increased flexion with weightbearing indicating decreased strength.   bosu ball lateral modified lunges 15x each LE, SUE support; Right leg with increased  flexion with weightbearing.   Tilt board forward/ backward 10x each LE placement 2 sets each placement.     modified single limb stance SUE support 30 seconds x2 trials each LE,   5lb ankle weights marching 4x length of // bars with SUE support, decreased stance phase RLE.; max verbal cueing  Seated 5lb ankle weight LAQ with 3 second holds 8x each LE, max cueing for upright posture    Vitals monitored throughout session.     Pt educated throughout session about proper posture and technique with exercises. Improved exercise technique, movement at target joints, use of target muscles after min to mod verbal, visual, tactile cues    Patient and patient's son educated on need for compliance with PT attendance for continuation of care and to receive benefit from therapy. PT educated on family that if patient does not make progress towards goals on recert day in two weeks that they will not quality for continuation of therapy due to poor compliance.  Patient and son verbalize understanding.                 PT Education - 08/24/19 1559    Education provided  Yes    Education Details  need for compliance with PT sessions,    Person(s) Educated  Patient;Child(ren)    Methods  Explanation    Comprehension  Verbalized understanding       PT Short Term Goals - 07/14/19 0839      PT SHORT TERM GOAL #1   Title  Patient will be independent in home exercise program to improve strength/mobility for better functional independence with ADLs.    Baseline  11/4: HEP given 1/7: reports compliance    Time  4    Period  Weeks    Status  Achieved    Target Date  06/08/19        PT Long Term Goals - 07/14/19 0839      PT LONG TERM GOAL #1   Title  Patient will demonstrate an improved Berg Balance Score of >40/56 as to demonstrate improved balance with ADLs such as sitting/standing and transfer balance and reduced fall risk    Baseline  11/4: 34/56 1/7: 37/56    Time  8    Period   Weeks    Status  Partially Met    Target Date  09/07/19      PT LONG TERM GOAL #2   Title  Patient will increase 10 meter walk test to >1.85ms as to improve gait speed for better community ambulation and to reduce fall risk    Baseline  11/4: 0.31 m/s with quad cane 1/7: 0.625 m/s holding cane above ground    Time  8    Period  Weeks    Status  Partially Met  Target Date  09/07/19      PT LONG TERM GOAL #3   Title  Patient will increase BLE gross strength to 4+/5 as to improve functional strength for independent gait, increased standing tolerance and increased ADL ability.    Baseline  11/4: L: grossly 4-/5 R grossly 3+/5 with hip add/ext 3/5 1/6; L grossly 4/5 R 4/5 hip add/ext 4-/5    Time  8    Period  Weeks    Status  Partially Met    Target Date  09/07/19      PT LONG TERM GOAL #4   Title  Patient will increase six minute walk test distance to >800 for progression to community ambulator and improve gait ability    Baseline  11/4: unable to perform 1/6: 662 ft with quad cane and significant R foot shuffle    Time  8    Period  Weeks    Status  Partially Met    Target Date  09/07/19            Plan - 08/24/19 2058    Clinical Impression Statement  Patient demonstrates significant decline in stability and strength of right lower extremity due to multiple weeks absent from therapy. Patient and patient's son educated on need for compliance with PT attendance for continuation of care and to receive benefit from therapy. PT educated on family that if patient does not make progress towards goals on recert day in two weeks that they will not quality for continuation of therapy due to poor compliance.  Patient and son verbalize understanding.    Personal Factors and Comorbidities  Age;Comorbidity 3+;Education;Past/Current Experience;Social Background;Time since onset of injury/illness/exacerbation    Comorbidities  Stroke, TIA, CVA, arthritis, dyslipedemia, HTN, DM, ataxia, needs  help with interpreter    Examination-Activity Limitations  Hygiene/Grooming;Locomotion Level;Reach Overhead;Squat;Stairs;Stand;Transfers    Examination-Participation Restrictions  Cleaning;Community Activity;Laundry;Shop;Meal Prep;Yard Work    [employer] Potential  Fair    PT Frequency  2x / week    PT Duration  8 weeks    PT Treatment/Interventions  ADLs/Self Care Home Management;Electrical Stimulation;Therapeutic activities;Functional mobility training;Stair training;Gait training;DME Instruction;Therapeutic exercise;Balance training;Neuromuscular re-education;Patient/family education;Manual techniques;Passive range of motion;Energy conservation;Taping;Aquatic Therapy;Biofeedback;Cryotherapy;Moist Heat;Traction;Ultrasound;Orthotic Fit/Training    PT Next Visit Plan  balance    PT Home Exercise Plan  see above    Consulted and Agree with Plan of Care  Patient;Family member/caregiver    Family Member Consulted  son       Patient will benefit from skilled therapeutic intervention in order to improve the following deficits and impairments:  Abnormal gait, Decreased activity tolerance, Decreased balance, Decreased knowledge of precautions, Decreased endurance, Decreased knowledge of use of DME, Decreased mobility, Decreased safety awareness, Difficulty walking, Decreased strength, Improper body mechanics, Decreased coordination, Impaired perceived functional ability, Impaired flexibility, Impaired vision/preception, Postural dysfunction, Pain  Visit Diagnosis: Other abnormalities of gait and mobility  Muscle weakness (generalized)  Unsteadiness on feet     Problem List Patient Active Problem List   Diagnosis Date Noted  . Hypertensive urgency 12/31/2016  . Stroke ([place]) 10/15/2016  . TIA (transient ischemic attack) 03/07/2016  . Acute CVA (cerebrovascular accident) ([place]) 07/03/2015   Janna Arch, PT, DPT   08/24/2019, 9:00 PM  [place] MAIN [facility]  SERVICES [address] [place], [place], [address] Phone: [phone]   Fax:  [phone]  Name: Christopher [name] MRN: [id number] Date of Birth: 22-Oct-1925

## 2019-08-25 ENCOUNTER — Ambulatory Visit: Payer: Medicare HMO

## 2019-08-31 ENCOUNTER — Ambulatory Visit: Payer: Medicare HMO

## 2019-08-31 ENCOUNTER — Other Ambulatory Visit: Payer: Self-pay

## 2019-08-31 DIAGNOSIS — R2689 Other abnormalities of gait and mobility: Secondary | ICD-10-CM

## 2019-08-31 DIAGNOSIS — M6281 Muscle weakness (generalized): Secondary | ICD-10-CM

## 2019-08-31 DIAGNOSIS — R2681 Unsteadiness on feet: Secondary | ICD-10-CM

## 2019-08-31 NOTE — Therapy (Signed)
[place] MAIN [facility] SERVICES [address] [place], [place], [address] Phone: [phone]   Fax:  [phone]  Physical Therapy Treatment  Patient Details  Name: Christopher [name] MRN: [id number] Date of Birth: 08-25-1925 Referring Provider (PT): Dr. Chryl Heck   Encounter Date: 08/31/2019  PT End of Session - 08/31/19 1713    Visit Number  16    Number of Visits  25    Date for PT Re-Evaluation  09/07/19    Authorization Type  6/10 PN    PT Start Time  1600    PT Stop Time  1644    PT Time Calculation (min)  44 min    Equipment Utilized During Treatment  Gait belt    Activity Tolerance  Patient tolerated treatment well;Other (comment);Patient limited by fatigue   comprehension   Behavior During Therapy  Flat affect;WFL for tasks assessed/performed       Past Medical History:  Diagnosis Date  . Arthritis   . Ataxia   . Diabetes mellitus without complication ([place])    controlled, pt checks it in the morning   . Dyslipidemia   . Hypertension    somewhat controlled, pt checks every morning, he reports it has been good.  . Stroke ([place])   . TIA (transient ischemic attack)     Past Surgical History:  Procedure Laterality Date  . none      There were no vitals filed for this visit.  Subjective Assessment - 08/31/19 1712    Subjective  Patient presents with son. Son reports patient hasn't had any falls, no pain reported.    Patient is accompained by:  Family member   son   Pertinent History  Patient is a 86 year old male who presents for abnormal gait, worsening over the past 2-3 weeks. Patient's son present and states walking is hard and not walking much anymore.  He has been seen previously at this facility ~ 1 year ago and discharged due to plateau of progress. Since he was seen last patient has received home health PT per physician notes.  Recent MRI (02/04/19) shows minimal changes in brain since 2018 chronic lacunar infarcts,  bilateral Anterior Cerebral Artery hyper intensities, moderate white matter microvascular ischemic changes and atrophy of the brain. X ray of knee shows no acute findings with moderate medial compartment joint space narrowing and of hip showing no fracture, bone lesion or hip joint abnormality. Patient bathes and dresses self independently. 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 No falls reported. Want to use machines to get strength back per son. Patient is having less comprehension requiring son for translation.    Limitations  Lifting;Standing;Walking;House hold activities    How long can you sit comfortably?  n/a    How long can you stand comfortably?  as soon as stand.    How long can you walk comfortably?  across his house     Diagnostic tests  Recent MRI (02/04/19) shows minimal changes in brain since 2018 chronic lacunar infarcts, bilateral Anterior Cerebral Artery hyper intensities, moderate white matter microvascular ischemic changes and atrophy of the brain. X ray of knee shows no acute findings with moderate medial compartment joint space narrowing and of hip showing no fracture, bone lesion or hip joint abnormality.    Patient Stated Goals  get some strength back and walk better.    Currently in Pain?  No/denies       Vitals at start  of session: 131/75 pulse 89       Treatment: All standing interventions require close CGA due to instability and fall risk.    Step over consecutive obstacles: hurdle, half foam roller, hurdle, half foam roller with decreasing UE support from bilateral to single UE support. 6x length of // bars, initially knocking hurdles over, stopped after second   airex pad: 4" step airex pad sandwich, lateral side stepping up/down 10x each direction with decreasing UE support from single to no UE support  airex pad with dynadisc for modified tandem stance 30 second hold each foot position on each surface x 3 trials each LE  airex pad: 4lb dumbells  punching alternating UE 15x    Bosu ball: flat side up static stand 1 minute max cueing  bosu ball forward modified lunges 15x each LE SUE support; right LE with increased flexion with weightbearing indicating decreased strength.     speed ladder: one foot in each square for increased step length, foot clearance, and equal weight shift, decreasing UE support from SUE support to no UE support 8x length of // bars  5lb ankle weights standing in // bars -Side stepping 6x length of // bars with focus on hip abduction/adduction and foot clearance -Hip extension 15x each LE, SUE support  -straight leg raise in standing with SUE support 15x each LE   Vitals monitored throughout session.   PT educated on family that if patient does not make progress towards goals on recert day in two weeks that they will not quality for continuation of therapy due to poor compliance. Patient and son verbalize understanding.     Pt educated throughout session about proper posture and technique with exercises. Improved exercise technique, movement at target joints, use of target muscles after min to mod verbal, visual, tactile cues                     PT Education - 08/31/19 1712    Education provided  Yes    Education Details  compliance with PT sessions, stability, body mechanics    Person(s) Educated  Patient    Methods  Explanation;Demonstration;Tactile cues;Verbal cues    Comprehension  Verbalized understanding;Returned demonstration;Verbal cues required;Tactile cues required       PT Short Term Goals - 07/14/19 0839      PT SHORT TERM GOAL #1   Title  Patient will be independent in home exercise program to improve strength/mobility for better functional independence with ADLs.    Baseline  11/4: HEP given 1/7: reports compliance    Time  4    Period  Weeks    Status  Achieved    Target Date  06/08/19        PT Long Term Goals - 07/14/19 0839      PT LONG TERM GOAL #1   Title   Patient will demonstrate an improved Berg Balance Score of >40/56 as to demonstrate improved balance with ADLs such as sitting/standing and transfer balance and reduced fall risk    Baseline  11/4: 34/56 1/7: 37/56    Time  8    Period  Weeks    Status  Partially Met    Target Date  09/07/19      PT LONG TERM GOAL #2   Title  Patient will increase 10 meter walk test to >1.67ms as to improve gait speed for better community ambulation and to reduce fall risk    Baseline  11/4: 0.31 m/s  with quad cane 1/7: 0.625 m/s holding cane above ground    Time  8    Period  Weeks    Status  Partially Met    Target Date  09/07/19      PT LONG TERM GOAL #3   Title  Patient will increase BLE gross strength to 4+/5 as to improve functional strength for independent gait, increased standing tolerance and increased ADL ability.    Baseline  11/4: L: grossly 4-/5 R grossly 3+/5 with hip add/ext 3/5 1/6; L grossly 4/5 R 4/5 hip add/ext 4-/5    Time  8    Period  Weeks    Status  Partially Met    Target Date  09/07/19      PT LONG TERM GOAL #4   Title  Patient will increase six minute walk test distance to >800 for progression to community ambulator and improve gait ability    Baseline  11/4: unable to perform 1/6: 662 ft with quad cane and significant R foot shuffle    Time  8    Period  Weeks    Status  Partially Met    Target Date  09/07/19            Plan - 08/31/19 1716    Clinical Impression Statement  Patient presents with good motivation throughout physical therapy session. Patient and patient's son continuation of education on need for compliance with PT attendance for continuation of care performed with patient and son agreeing. Patient is challenged with decreasing UE support with resultant decrease in weight acceptance onto RLE. The patient would benefit from further skilled PT intervention to maximize mobility, safety, and independence    Personal Factors and Comorbidities   Age;Comorbidity 3+;Education;Past/Current Experience;Social Background;Time since onset of injury/illness/exacerbation    Comorbidities  Stroke, TIA, CVA, arthritis, dyslipedemia, HTN, DM, ataxia, needs help with interpreter    Examination-Activity Limitations  Hygiene/Grooming;Locomotion Level;Reach Overhead;Squat;Stairs;Stand;Transfers    Examination-Participation Restrictions  Cleaning;Community Activity;Laundry;Shop;Meal Prep;Yard Work    [employer] Potential  Fair    PT Frequency  2x / week    PT Duration  8 weeks    PT Treatment/Interventions  ADLs/Self Care Home Management;Electrical Stimulation;Therapeutic activities;Functional mobility training;Stair training;Gait training;DME Instruction;Therapeutic exercise;Balance training;Neuromuscular re-education;Patient/family education;Manual techniques;Passive range of motion;Energy conservation;Taping;Aquatic Therapy;Biofeedback;Cryotherapy;Moist Heat;Traction;Ultrasound;Orthotic Fit/Training    PT Next Visit Plan  balance    PT Home Exercise Plan  see above    Consulted and Agree with Plan of Care  Patient;Family member/caregiver    Family Member Consulted  son       Patient will benefit from skilled therapeutic intervention in order to improve the following deficits and impairments:  Abnormal gait, Decreased activity tolerance, Decreased balance, Decreased knowledge of precautions, Decreased endurance, Decreased knowledge of use of DME, Decreased mobility, Decreased safety awareness, Difficulty walking, Decreased strength, Improper body mechanics, Decreased coordination, Impaired perceived functional ability, Impaired flexibility, Impaired vision/preception, Postural dysfunction, Pain  Visit Diagnosis: Other abnormalities of gait and mobility  Muscle weakness (generalized)  Unsteadiness on feet     Problem List Patient Active Problem List   Diagnosis Date Noted  . Hypertensive urgency 12/31/2016  . Stroke ([place]) 10/15/2016  . TIA  (transient ischemic attack) 03/07/2016  . Acute CVA (cerebrovascular accident) ([place]) 07/03/2015   Janna Arch, PT, DPT   08/31/2019, 5:17 PM  [place] MAIN [facility] SERVICES [address] [place], [place], [address] Phone: [phone]   Fax:  [phone]  Name: Gen Clagg MRN: [id number]  Date of Birth: October 27, 1925

## 2019-09-01 ENCOUNTER — Other Ambulatory Visit: Payer: Self-pay

## 2019-09-01 ENCOUNTER — Ambulatory Visit: Payer: Medicare HMO

## 2019-09-01 DIAGNOSIS — Z9181 History of falling: Secondary | ICD-10-CM

## 2019-09-01 DIAGNOSIS — M6281 Muscle weakness (generalized): Secondary | ICD-10-CM

## 2019-09-01 DIAGNOSIS — R2689 Other abnormalities of gait and mobility: Secondary | ICD-10-CM | POA: Diagnosis not present

## 2019-09-01 DIAGNOSIS — R2681 Unsteadiness on feet: Secondary | ICD-10-CM

## 2019-09-01 NOTE — Therapy (Signed)
[place] MAIN [facility] SERVICES [address] [place], [place], [address] Phone: [phone]   Fax:  [phone]  Physical Therapy Treatment  Patient Details  Name: Christopher [name] MRN: [id number] Date of Birth: 07/07/1926 Referring Provider (PT): Dr. Chryl Heck   Encounter Date: 09/01/2019  PT End of Session - 09/01/19 1553    Visit Number  17    Number of Visits  25    Date for PT Re-Evaluation  09/07/19    Authorization Type  7/10 PN    PT Start Time  1601    PT Stop Time  1644    PT Time Calculation (min)  43 min    Equipment Utilized During Treatment  Gait belt    Activity Tolerance  Patient tolerated treatment well;Other (comment);Patient limited by fatigue   comprehension   Behavior During Therapy  Flat affect;WFL for tasks assessed/performed       Past Medical History:  Diagnosis Date  . Arthritis   . Ataxia   . Diabetes mellitus without complication ([place])    controlled, pt checks it in the morning   . Dyslipidemia   . Hypertension    somewhat controlled, pt checks every morning, he reports it has been good.  . Stroke ([place])   . TIA (transient ischemic attack)     Past Surgical History:  Procedure Laterality Date  . none      There were no vitals filed for this visit.  Subjective Assessment - 09/01/19 1611    Subjective  Patient presents with son, is aware of goals needing to be performed next session. Requests to assess some goals this session.    Patient is accompained by:  Family member   son   Pertinent History  Patient is a 86 year old male who presents for abnormal gait, worsening over the past 2-3 weeks. Patient's son present and states walking is hard and not walking much anymore.  He has been seen previously at this facility ~ 1 year ago and discharged due to plateau of progress. Since he was seen last patient has received home health PT per physician notes.  Recent MRI (02/04/19) shows minimal changes in brain  since 2018 chronic lacunar infarcts, bilateral Anterior Cerebral Artery hyper intensities, moderate white matter microvascular ischemic changes and atrophy of the brain. X ray of knee shows no acute findings with moderate medial compartment joint space narrowing and of hip showing no fracture, bone lesion or hip joint abnormality. Patient bathes and dresses self independently. 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 No falls reported. Want to use machines to get strength back per son. Patient is having less comprehension requiring son for translation.    Limitations  Lifting;Standing;Walking;House hold activities    How long can you sit comfortably?  n/a    How long can you stand comfortably?  as soon as stand.    How long can you walk comfortably?  across his house     Diagnostic tests  Recent MRI (02/04/19) shows minimal changes in brain since 2018 chronic lacunar infarcts, bilateral Anterior Cerebral Artery hyper intensities, moderate white matter microvascular ischemic changes and atrophy of the brain. X ray of knee shows no acute findings with moderate medial compartment joint space narrowing and of hip showing no fracture, bone lesion or hip joint abnormality.    Patient Stated Goals  get some strength back and walk better.    Currently in Pain?  No/denies  Vitals at start of session 131/68 pulse 80   Per son request testing goals/outcome measures today:   10MWT: 15.36 =0.65 m/s without AD   BERG: 42/ 56    6 minute walk test : 580 with multiple near LOB requiring max A to retain upright posture.    Gait training: gait mechanics in hallway with max cueing for weight shift, foot clearance, and heel strike. Frequent dragging of LLE with buckling of R knee due to limited weight acceptance with fatigue. Turning every 100 ft requires mod to max A to retain COM and keep on feet. 540 ft    Patient and son aware that goals must be addressed next session and progress must  be made for continuation of care.    Per patient's son's request patient performed trial run through of goals to understand tasks needed for following week. Patient demonstrated significant gait deficits with frequent LOB and buckling of RLE with fatigue. Gait training in hallway performed with max cueing. Patient and son aware that patient must demonstrate progress in goals next session for progression of care. The patient would benefit from further skilled PT intervention to maximize mobility, safety, and independence                 PT Education - 09/01/19 1553    Education provided  Yes    Education Details  compliance with PT, stability, body mechanics    Person(s) Educated  Patient    Methods  Explanation;Demonstration;Tactile cues;Verbal cues    Comprehension  Returned demonstration;Verbalized understanding;Verbal cues required;Tactile cues required       PT Short Term Goals - 07/14/19 0839      PT SHORT TERM GOAL #1   Title  Patient will be independent in home exercise program to improve strength/mobility for better functional independence with ADLs.    Baseline  11/4: HEP given 1/7: reports compliance    Time  4    Period  Weeks    Status  Achieved    Target Date  06/08/19        PT Long Term Goals - 07/14/19 0839      PT LONG TERM GOAL #1   Title  Patient will demonstrate an improved Berg Balance Score of >40/56 as to demonstrate improved balance with ADLs such as sitting/standing and transfer balance and reduced fall risk    Baseline  11/4: 34/56 1/7: 37/56    Time  8    Period  Weeks    Status  Partially Met    Target Date  09/07/19      PT LONG TERM GOAL #2   Title  Patient will increase 10 meter walk test to >1.42ms as to improve gait speed for better community ambulation and to reduce fall risk    Baseline  11/4: 0.31 m/s with quad cane 1/7: 0.625 m/s holding cane above ground    Time  8    Period  Weeks    Status  Partially Met    Target Date   09/07/19      PT LONG TERM GOAL #3   Title  Patient will increase BLE gross strength to 4+/5 as to improve functional strength for independent gait, increased standing tolerance and increased ADL ability.    Baseline  11/4: L: grossly 4-/5 R grossly 3+/5 with hip add/ext 3/5 1/6; L grossly 4/5 R 4/5 hip add/ext 4-/5    Time  8    Period  Weeks    Status  Partially Met    Target Date  09/07/19      PT LONG TERM GOAL #4   Title  Patient will increase six minute walk test distance to >800 for progression to community ambulator and improve gait ability    Baseline  11/4: unable to perform 1/6: 662 ft with quad cane and significant R foot shuffle    Time  8    Period  Weeks    Status  Partially Met    Target Date  09/07/19            Plan - 09/02/19 1000    Clinical Impression Statement  Per patient's son's request patient performed trial run through of goals to understand tasks needed for following week. Patient demonstrated significant gait deficits with frequent LOB and buckling of RLE with fatigue. Gait training in hallway performed with max cueing. Patient and son aware that patient must demonstrate progress in goals next session for progression of care. The patient would benefit from further skilled PT intervention to maximize mobility, safety, and independence    Personal Factors and Comorbidities  Age;Comorbidity 3+;Education;Past/Current Experience;Social Background;Time since onset of injury/illness/exacerbation    Comorbidities  Stroke, TIA, CVA, arthritis, dyslipedemia, HTN, DM, ataxia, needs help with interpreter    Examination-Activity Limitations  Hygiene/Grooming;Locomotion Level;Reach Overhead;Squat;Stairs;Stand;Transfers    Examination-Participation Restrictions  Cleaning;Community Activity;Laundry;Shop;Meal Prep;Yard Work    [employer] Potential  Fair    PT Frequency  2x / week    PT Duration  8 weeks    PT Treatment/Interventions  ADLs/Self Care Home Management;Electrical  Stimulation;Therapeutic activities;Functional mobility training;Stair training;Gait training;DME Instruction;Therapeutic exercise;Balance training;Neuromuscular re-education;Patient/family education;Manual techniques;Passive range of motion;Energy conservation;Taping;Aquatic Therapy;Biofeedback;Cryotherapy;Moist Heat;Traction;Ultrasound;Orthotic Fit/Training    PT Next Visit Plan  goals    PT Home Exercise Plan  see above    Consulted and Agree with Plan of Care  Patient;Family member/caregiver    Family Member Consulted  son       Patient will benefit from skilled therapeutic intervention in order to improve the following deficits and impairments:  Abnormal gait, Decreased activity tolerance, Decreased balance, Decreased knowledge of precautions, Decreased endurance, Decreased knowledge of use of DME, Decreased mobility, Decreased safety awareness, Difficulty walking, Decreased strength, Improper body mechanics, Decreased coordination, Impaired perceived functional ability, Impaired flexibility, Impaired vision/preception, Postural dysfunction, Pain  Visit Diagnosis: Other abnormalities of gait and mobility  Muscle weakness (generalized)  Unsteadiness on feet  History of fall     Problem List Patient Active Problem List   Diagnosis Date Noted  . Hypertensive urgency 12/31/2016  . Stroke ([facility]) 10/15/2016  . TIA (transient ischemic attack) 03/07/2016  . Acute CVA (cerebrovascular accident) ([place]) 07/03/2015   Janna Arch, PT, DPT   09/02/2019, 10:02 AM  [place] MAIN [facility] SERVICES [address] [place], [place], [address] Phone: [phone]   Fax:  [phone]  Name: Christopher [name] MRN: [id number] Date of Birth: 04-10-1926

## 2019-09-07 ENCOUNTER — Ambulatory Visit: Payer: Medicare HMO

## 2019-09-08 ENCOUNTER — Ambulatory Visit: Payer: Medicare HMO

## 2019-09-14 ENCOUNTER — Ambulatory Visit: Payer: Medicare HMO | Attending: Family Medicine

## 2019-09-14 ENCOUNTER — Other Ambulatory Visit: Payer: Self-pay

## 2019-09-14 DIAGNOSIS — M6281 Muscle weakness (generalized): Secondary | ICD-10-CM

## 2019-09-14 DIAGNOSIS — R2689 Other abnormalities of gait and mobility: Secondary | ICD-10-CM

## 2019-09-14 DIAGNOSIS — R2681 Unsteadiness on feet: Secondary | ICD-10-CM

## 2019-09-14 NOTE — Therapy (Signed)
[place] MAIN [facility] SERVICES [address] [place], [place], [address] Phone: [phone]   Fax:  [phone]  Physical Therapy Treatment/one visit recert/Discharge  Patient Details  Name: Christopher [name] MRN: [id number] Date of Birth: Mar 10, 1926 Referring Provider (PT): Dr. Chryl Heck   Encounter Date: 09/14/2019  PT End of Session - 09/14/19 1837    Visit Number  18    Number of Visits  25    Date for PT Re-Evaluation  09/14/19    Authorization Type  8/10 PN    PT Start Time  1613    PT Stop Time  1645    PT Time Calculation (min)  32 min    Equipment Utilized During Treatment  Gait belt    Activity Tolerance  Patient tolerated treatment well   comprehension   Behavior During Therapy  Flat affect;WFL for tasks assessed/performed       Past Medical History:  Diagnosis Date  . Arthritis   . Ataxia   . Diabetes mellitus without complication ([place])    controlled, pt checks it in the morning   . Dyslipidemia   . Hypertension    somewhat controlled, pt checks every morning, he reports it has been good.  . Stroke ([place])   . TIA (transient ischemic attack)     Past Surgical History:  Procedure Laterality Date  . none      There were no vitals filed for this visit.  Subjective Assessment - 09/14/19 1836    Subjective  Patient arrives late with son. Is aware today he needs to demonstrate progress to continue PT. Has missed the past few sessions again.    Patient is accompained by:  Family member   son   Pertinent History  Patient is a 86 year old male who presents for abnormal gait, worsening over the past 2-3 weeks. Patient's son present and states walking is hard and not walking much anymore.  He has been seen previously at this facility ~ 1 year ago and discharged due to plateau of progress. Since he was seen last patient has received home health PT per physician notes.  Recent MRI (02/04/19) shows minimal changes in brain since 2018  chronic lacunar infarcts, bilateral Anterior Cerebral Artery hyper intensities, moderate white matter microvascular ischemic changes and atrophy of the brain. X ray of knee shows no acute findings with moderate medial compartment joint space narrowing and of hip showing no fracture, bone lesion or hip joint abnormality. Patient bathes and dresses self independently. 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 No falls reported. Want to use machines to get strength back per son. Patient is having less comprehension requiring son for translation.    Limitations  Lifting;Standing;Walking;House hold activities    How long can you sit comfortably?  n/a    How long can you stand comfortably?  as soon as stand.    How long can you walk comfortably?  across his house     Diagnostic tests  Recent MRI (02/04/19) shows minimal changes in brain since 2018 chronic lacunar infarcts, bilateral Anterior Cerebral Artery hyper intensities, moderate white matter microvascular ischemic changes and atrophy of the brain. X ray of knee shows no acute findings with moderate medial compartment joint space narrowing and of hip showing no fracture, bone lesion or hip joint abnormality.    Patient Stated Goals  get some strength back and walk better.    Currently in Pain?  No/denies  Vitals at start of session 137/70 pulse 87    BERG: 42/56  10 MWT first trial 21. 2 second trial 20.4 =0.49 m/s  BLE strength: see goal line  6 Minute walk test: 640 ft with multiple near LOB requiring max A to retain upright posture. Frequent dragging of LLE with buckling of R knee due to limited weight acceptance with fatigue.   Due to patient's limited compliance with attendance, home program, and punctuality in combination with regression/plateau in in goals patient will be discharged this session. Patient and son are aware of reasons for discharge and agreeable.     [facility] PT Assessment - 09/14/19 0001      Standardized  Balance Assessment   Standardized Balance Assessment  Berg Balance Test      Berg Balance Test   Sit to Stand  Able to stand without using hands and stabilize independently    Standing Unsupported  Able to stand safely 2 minutes    Sitting with Back Unsupported but Feet Supported on Floor or Stool  Able to sit safely and securely 2 minutes    Stand to Sit  Sits safely with minimal use of hands    Transfers  Able to transfer safely, minor use of hands    Standing Unsupported with Eyes Closed  Able to stand 10 seconds with supervision    Standing Unsupported with Feet Together  Able to place feet together independently and stand for 1 minute with supervision    From Standing, Reach Forward with Outstretched Arm  Can reach forward >12 cm safely (5")    From Standing Position, Pick up Object from Floor  Able to pick up shoe, needs supervision    From Standing Position, Turn to Look Behind Over each Shoulder  Looks behind one side only/other side shows less weight shift    Turn 360 Degrees  Able to turn 360 degrees safely but slowly    Standing Unsupported, Alternately Place Feet on Step/Stool  Able to complete 4 steps without aid or supervision    Standing Unsupported, One Foot in [place] to take small step independently and hold 30 seconds    Standing on One Leg  Tries to lift leg/unable to hold 3 seconds but remains standing independently    Total Score  42                         PT Education - 09/14/19 1837    Education provided  Yes    Education Details  goals , discharge    Person(s) Educated  Patient;Child(ren)    Methods  Explanation    Comprehension  Verbalized understanding       PT Short Term Goals - 07/14/19 0839      PT SHORT TERM GOAL #1   Title  Patient will be independent in home exercise program to improve strength/mobility for better functional independence with ADLs.    Baseline  11/4: HEP given 1/7: reports compliance    Time  4    Period   Weeks    Status  Achieved    Target Date  06/08/19        PT Long Term Goals - 09/14/19 1841      PT LONG TERM GOAL #1   Title  Patient will demonstrate an improved Berg Balance Score of >40/56 as to demonstrate improved balance with ADLs such as sitting/standing and transfer balance and reduced fall risk  Baseline  11/4: 34/56 1/7: 37/56 2/25: 42/56 3/10 42/56    Time  8    Period  Weeks    Status  Achieved      PT LONG TERM GOAL #2   Title  Patient will increase 10 meter walk test to >1.28ms as to improve gait speed for better community ambulation and to reduce fall risk    Baseline  11/4: 0.31 m/s with quad cane 1/7: 0.625 m/s holding cane above ground 3/10 0.49 m/s    Time  8    Period  Weeks    Status  Not Met      PT LONG TERM GOAL #3   Title  Patient will increase BLE gross strength to 4+/5 as to improve functional strength for independent gait, increased standing tolerance and increased ADL ability.    Baseline  11/4: L: grossly 4-/5 R grossly 3+/5 with hip add/ext 3/5 1/6; L grossly 4/5 R 4/5 hip add/ext 4-/5  3/10 L hip 4-/5 add/ext 3+/5    Time  8    Period  Weeks    Status  Not Met      PT LONG TERM GOAL #4   Title  Patient will increase six minute walk test distance to >800 for progression to community ambulator and improve gait ability    Baseline  11/4: unable to perform 1/6: 662 ft with quad cane and significant R foot shuffle 3/10 : 640 ft    Time  8    Period  Weeks    Status  Not Met            Plan - 09/14/19 1840    Clinical Impression Statement  Due to patient's limited compliance with attendance, home program, and punctuality in combination with regression/plateau in in goals patient will be discharged this session. Patient and son are aware of reasons for discharge and agreeable.    Personal Factors and Comorbidities  Age;Comorbidity 3+;Education;Past/Current Experience;Social Background;Time since onset of injury/illness/exacerbation     Comorbidities  Stroke, TIA, CVA, arthritis, dyslipedemia, HTN, DM, ataxia, needs help with interpreter    Examination-Activity Limitations  Hygiene/Grooming;Locomotion Level;Reach Overhead;Squat;Stairs;Stand;Transfers    Examination-Participation Restrictions  Cleaning;Community Activity;Laundry;Shop;Meal Prep;Yard Work    Rehab Potential  Fair    PT Frequency  One time visit    PT Duration  8 weeks    PT Treatment/Interventions  ADLs/Self Care Home Management;Electrical Stimulation;Therapeutic activities;Functional mobility training;Stair training;Gait training;DME Instruction;Therapeutic exercise;Balance training;Neuromuscular re-education;Patient/family education;Manual techniques;Passive range of motion;Energy conservation;Taping;Aquatic Therapy;Biofeedback;Cryotherapy;Moist Heat;Traction;Ultrasound;Orthotic Fit/Training    PT Home Exercise Plan  see above    Consulted and Agree with Plan of Care  Patient;Family member/caregiver    Family Member Consulted  son       Patient will benefit from skilled therapeutic intervention in order to improve the following deficits and impairments:  Abnormal gait, Decreased activity tolerance, Decreased balance, Decreased knowledge of precautions, Decreased endurance, Decreased knowledge of use of DME, Decreased mobility, Decreased safety awareness, Difficulty walking, Decreased strength, Improper body mechanics, Decreased coordination, Impaired perceived functional ability, Impaired flexibility, Impaired vision/preception, Postural dysfunction, Pain  Visit Diagnosis: Other abnormalities of gait and mobility  Muscle weakness (generalized)  Unsteadiness on feet     Problem List Patient Active Problem List   Diagnosis Date Noted  . Hypertensive urgency 12/31/2016  . St[place] 10/15/2016  . TIA (transient ischemic attack) 03/07/2016  . [place]ce[place] 07/[place]   MJanna Arch PT, DPT   09/14/2019, 6:45 PM  [facility]  [place] MAIN [facility] SERVICES [address] [place], [place], [address] Phone: [phone]   Fax:  [phone]  Name: Christopher [name] MRN: [id number] Date of Birth: [birth year]

## 2019-09-15 ENCOUNTER — Ambulatory Visit: Payer: Medicare HMO

## 2019-09-21 ENCOUNTER — Ambulatory Visit: Payer: Medicare HMO

## 2019-09-22 ENCOUNTER — Ambulatory Visit: Payer: Medicare HMO

## 2019-09-28 ENCOUNTER — Ambulatory Visit: Payer: Medicare HMO

## 2019-09-29 ENCOUNTER — Ambulatory Visit: Payer: Medicare HMO

## 2019-10-05 ENCOUNTER — Ambulatory Visit: Payer: Medicare HMO

## 2019-10-06 ENCOUNTER — Ambulatory Visit: Payer: Medicare HMO

## 2019-10-12 ENCOUNTER — Ambulatory Visit: Payer: Medicare HMO

## 2019-10-13 ENCOUNTER — Ambulatory Visit: Payer: Medicare HMO

## 2019-10-19 ENCOUNTER — Ambulatory Visit: Payer: Medicare HMO

## 2019-10-20 ENCOUNTER — Ambulatory Visit: Payer: Medicare HMO

## 2019-10-26 ENCOUNTER — Ambulatory Visit: Payer: Medicare HMO

## 2019-10-27 ENCOUNTER — Ambulatory Visit: Payer: Medicare HMO

## 2019-11-02 ENCOUNTER — Ambulatory Visit: Payer: Medicare HMO

## 2019-11-03 ENCOUNTER — Ambulatory Visit: Payer: Medicare HMO

## 2020-10-10 ENCOUNTER — Ambulatory Visit (INDEPENDENT_AMBULATORY_CARE_PROVIDER_SITE_OTHER): Payer: Medicare HMO | Admitting: Dermatology

## 2020-10-10 ENCOUNTER — Other Ambulatory Visit: Payer: Self-pay

## 2020-10-10 DIAGNOSIS — L2081 Atopic neurodermatitis: Secondary | ICD-10-CM | POA: Diagnosis not present

## 2020-10-10 MED ORDER — TRIAMCINOLONE ACETONIDE 0.1 % EX CREA: 1.0000 "application " | TOPICAL_CREAM | Freq: Two times a day (BID) | CUTANEOUS | 3 refills | Status: DC | PRN

## 2020-10-10 MED ORDER — DUPIXENT 300 MG/2ML ~~LOC~~ SOSY: 300.0000 mg | PREFILLED_SYRINGE | SUBCUTANEOUS | 3 refills | Status: DC

## 2020-10-10 MED ORDER — DUPILUMAB 300 MG/2ML ~~LOC~~ SOSY
600.0000 mg | PREFILLED_SYRINGE | Freq: Once | SUBCUTANEOUS | Status: AC
  Administered 2020-10-10: 600 mg via SUBCUTANEOUS

## 2020-10-10 NOTE — Progress Notes (Signed)
   New Patient Visit  Subjective  Christopher [name] is a 87 y.o. male who presents for the following: Rash (New pt c/o itchy skin for over 1 year, pt has went to several doctors to help with this itch and nothing has worked, pt has tried prescription topical creams with a poor response, Pt had a biopsy taken 1 year ago at [facility], pt don't know what the biopsy results showed).  Son with patient contributing to history  The following portions of the chart were reviewed this encounter and updated as appropriate:   Tobacco  Allergies  Meds  Problems  Med Hx  Surg Hx  Fam Hx     Review of Systems:  No other skin or systemic complaints except as noted in HPI or Assessment and Plan.  Objective  Well appearing patient in no apparent distress; mood and affect are within normal limits.  A focused examination was performed including face, exts, trunk . Relevant physical exam findings are noted in the Assessment and Plan.  Objective  trunk, exts: Excoriations and erythema   Images               Assessment & Plan  Atopic neurodermatitis with severe persistent pruritus not improved after seeing multiple doctors and using multiple topical steroid treatments and other topical treatments. trunk, exts  Atopic neurodermatitis with Pruritus/Excoriations and Dyschromia   Atopic dermatitis - Severe,  Atopic dermatitis (eczema) is a chronic, relapsing, pruritic condition that can significantly affect quality of life. It is often associated with allergic rhinitis and/or asthma and can require treatment with topical medications, phototherapy, or in severe cases a biologic medication called Dupixent.    Recommend starting Dupixent sample injection today  Dupilumab (Dupixent) is a treatment given by injection for adults with moderate-to-severe atopic dermatitis. Goal is control of skin condition, not cure. It is given as 2 injections at the first dose followed by 1 injection ever 2 weeks  thereafter.  Potential side effects include allergic reaction, herpes infections, injection site reactions and conjunctivitis (inflammation of the eyes).  The use of Dupixent requires long term medication management, including periodic office visits.   Dupxient 300 mg injected to R upper arm and L upper  Lot# [id number] Exp 07/2022  Dupixent My[employer] enrollment form signed Pt requesting a topical cream   Start Triamcinolone 0.1% cream twice a day as needed Avoid applying to face, groin, and axilla. Use as directed. Risk of skin atrophy with long-term use reviewed.    Topical steroids (such as triamcinolone, fluocinolone, fluocinonide, mometasone, clobetasol, halobetasol, betamethasone, hydrocortisone) can cause thinning and lightening of the skin if they are used for too long in the same area. Your physician has selected the right strength medicine for your problem and area affected on the body. Please use your medication only as directed by your physician to prevent side effects.    Ordered Medications: dupilumab (DUPIXENT) 300 MG/2ML prefilled syringe  Other Related Medications triamcinolone (KENALOG) 0.1 %  Return in about 2 weeks (around 10/24/2020) for Dupixent injection .  IAngelique Holm, CMA, am acting as scribe for Armida Sans, MD .  Documentation: I have reviewed the above documentation for accuracy and completeness, and I agree with the above.  Armida Sans, MD

## 2020-10-10 NOTE — Patient Instructions (Addendum)
Topical steroids (such as triamcinolone, fluocinolone, fluocinonide, mometasone, clobetasol, halobetasol, betamethasone, hydrocortisone) can cause thinning and lightening of the skin if they are used for too long in the same area. Your physician has selected the right strength medicine for your problem and area affected on the body. Please use your medication only as directed by your physician to prevent side effects.    Dupilumab (Dupixent) is a treatment given by injection for adults with moderate-to-severe atopic dermatitis. Goal is control of skin condition, not cure. It is given as 2 injections at the first dose followed by 1 injection ever 2 weeks thereafter.  Potential side effects include allergic reaction, herpes infections, injection site reactions and conjunctivitis (inflammation of the eyes).  The use of Dupixent requires long term medication management, including periodic office visits.  

## 2020-10-11 ENCOUNTER — Other Ambulatory Visit: Payer: Self-pay

## 2020-10-11 DIAGNOSIS — L2081 Atopic neurodermatitis: Secondary | ICD-10-CM

## 2020-10-11 MED ORDER — TRIAMCINOLONE ACETONIDE 0.1 % EX CREA: 1.0000 "application " | TOPICAL_CREAM | Freq: Two times a day (BID) | CUTANEOUS | 3 refills | Status: DC | PRN

## 2020-10-11 NOTE — Progress Notes (Signed)
Pharmacy requested clarification  

## 2020-10-16 ENCOUNTER — Encounter: Payer: Self-pay | Admitting: Dermatology

## 2020-10-24 ENCOUNTER — Other Ambulatory Visit: Payer: Self-pay

## 2020-10-24 ENCOUNTER — Ambulatory Visit (INDEPENDENT_AMBULATORY_CARE_PROVIDER_SITE_OTHER): Payer: Medicare HMO | Admitting: Dermatology

## 2020-10-24 DIAGNOSIS — L819 Disorder of pigmentation, unspecified: Secondary | ICD-10-CM

## 2020-10-24 DIAGNOSIS — L2081 Atopic neurodermatitis: Secondary | ICD-10-CM

## 2020-10-24 DIAGNOSIS — L299 Pruritus, unspecified: Secondary | ICD-10-CM | POA: Diagnosis not present

## 2020-10-24 MED ORDER — DUPILUMAB 300 MG/2ML ~~LOC~~ SOSY
300.0000 mg | PREFILLED_SYRINGE | Freq: Once | SUBCUTANEOUS | Status: AC
  Administered 2020-10-24: 300 mg via SUBCUTANEOUS

## 2020-10-24 NOTE — Progress Notes (Signed)
   Follow-Up Visit   Subjective  Christopher [name] is a 87 y.o. male who presents for the following: Follow-up (Atopic Neurodermatitis follow up - loading dose of Dupixent given 10/10/2020. He is still scratching a lot).  Accompanied by son  The following portions of the chart were reviewed this encounter and updated as appropriate:   Tobacco  Allergies  Meds  Problems  Med Hx  Surg Hx  Fam Hx     Review of Systems:  No other skin or systemic complaints except as noted in HPI or Assessment and Plan.  Objective  Well appearing patient in no apparent distress; mood and affect are within normal limits.  All skin waist up examined.  Objective  Trunk, extremities: Crusts, excoriations and spotty hyperpigmentation.   Assessment & Plan  Atopic neurodermatitis with pruritus excoriations and dyschromia Trunk, extremities Atopic neurodermatitis with severe persistent pruritus without improvement after seeing multiple doctors and using multiple topical steroid treatments and other topical treatments.   He is still itching after a loading dose of Dupixent 2 weeks ago. Advised the patient and son that we need to give at least 6 more weeks to see if he gets improvement.  If not may consider other injectable or oral such as Adbry or Rinvoq.   Atopic dermatitis - Severe,  Atopic dermatitis (eczema) is a chronic, relapsing, pruritic condition that can significantly affect quality of life. It is often associated with allergic rhinitis and/or asthma and can require treatment with topical medications, phototherapy, or in severe cases a biologic medication called Dupixent.     Recommend starting Dupixent sample injection today  Dupilumab (Dupixent) is a treatment given by injection for adults with moderate-to-severe atopic dermatitis. Goal is control of skin condition, not cure. It is given as 2 injections at the first dose followed by 1 injection ever 2 weeks thereafter.   Potential side effects  include allergic reaction, herpes infections, injection site reactions and conjunctivitis (inflammation of the eyes).  The use of Dupixent requires long term medication management, including periodic office visits.    Dupxient 300 mg injected to left upper arm Lot# [id number] Exp 07/2022  May consider Adbry or Rinvoq in the future if not improving with Dupixent  dupilumab (DUPIXENT) prefilled syringe 300 mg - Trunk, extremities  Other Related Medications dupilumab (DUPIXENT) 300 MG/2ML prefilled syringe triamcinolone (KENALOG) 0.1 %  Return in about 2 weeks (around 11/07/2020) for Follow up.  I, Joanie Coddington, CMA, am acting as scribe for Armida Sans, MD .  Documentation: I have reviewed the above documentation for accuracy and completeness, and I agree with the above.  Armida Sans, MD

## 2020-10-24 NOTE — Patient Instructions (Signed)

## 2020-10-26 ENCOUNTER — Encounter: Payer: Self-pay | Admitting: Dermatology

## 2020-10-29 MED ORDER — DUPILUMAB 300 MG/2ML ~~LOC~~ SOAJ: 300.0000 mg | Freq: Once | SUBCUTANEOUS | Status: DC

## 2020-10-29 MED ORDER — DUPILUMAB 300 MG/2ML ~~LOC~~ SOSY
600.0000 mg | PREFILLED_SYRINGE | Freq: Once | SUBCUTANEOUS | Status: AC
  Administered 2020-10-10 – 2020-10-29 (×2): 600 mg via SUBCUTANEOUS

## 2020-10-29 NOTE — Addendum Note (Signed)
Addended by: Angelique Holm on: 10/29/2020 10:44 AM   Modules accepted: Orders

## 2020-11-05 ENCOUNTER — Other Ambulatory Visit: Payer: Self-pay

## 2020-11-05 DIAGNOSIS — L2081 Atopic neurodermatitis: Secondary | ICD-10-CM

## 2020-11-05 MED ORDER — TRIAMCINOLONE ACETONIDE 0.1 % EX CREA: 1.0000 "application " | TOPICAL_CREAM | Freq: Two times a day (BID) | CUTANEOUS | 3 refills | Status: AC | PRN

## 2020-11-05 NOTE — Progress Notes (Signed)
We have faxed and called pharmacy to clarify Triamcinolone Cream.  Resent information in escript today.

## 2020-11-08 ENCOUNTER — Other Ambulatory Visit: Payer: Self-pay

## 2020-11-08 ENCOUNTER — Ambulatory Visit (INDEPENDENT_AMBULATORY_CARE_PROVIDER_SITE_OTHER): Payer: Medicare HMO | Admitting: Dermatology

## 2020-11-08 DIAGNOSIS — L2081 Atopic neurodermatitis: Secondary | ICD-10-CM

## 2020-11-08 MED ORDER — DUPILUMAB 300 MG/2ML ~~LOC~~ SOAJ
300.0000 mg | Freq: Once | SUBCUTANEOUS | Status: AC
  Administered 2020-11-08: 300 mg via SUBCUTANEOUS

## 2020-11-08 NOTE — Progress Notes (Signed)
   Follow-Up Visit   Subjective  Christopher [name] is a 87 y.o. male who presents for the following: Dermatitis (2 weeks f/u atopic dermatitis, skin improving ). Pt has his Dupixent injections in the office today.  Patient and his son who is present with him finally admit that he is improving with the current Dupixent treatments.  He has decreased itching and scratches.  Son with pt contributing to history   The following portions of the chart were reviewed this encounter and updated as appropriate:   Tobacco  Allergies  Meds  Problems  Med Hx  Surg Hx  Fam Hx     Review of Systems:  No other skin or systemic complaints except as noted in HPI or Assessment and Plan.  Objective  Well appearing patient in no apparent distress; mood and affect are within normal limits.  A focused examination was performed including face, neck, chest and back. Relevant physical exam findings are noted in the Assessment and Plan.  Objective  Right Upper Arm - Anterior: Scaly erythematous papules and patches +/- dyspigmentation, lichenification, excoriations.    Assessment & Plan  Atopic neurodermatitis Right Upper Arm - Anterior  Improving on Dupixent injections   Atopic dermatitis - Severe, on Dupixent (biologic medication).  Atopic dermatitis (eczema) is a chronic, relapsing, pruritic condition that can significantly affect quality of life. It is often associated with allergic rhinitis and/or asthma and can require treatment with topical medications, phototherapy, or in severe cases a biologic medication called Dupixent.    Cont Dupixent injections injection to the R upper arm, pt tolerated injection well  Lot# [id number] Exp 05/2023  Other Related Medications dupilumab (DUPIXENT) 300 MG/2ML prefilled syringe triamcinolone cream (KENALOG) 0.1 %  Return in about 2 weeks (around 11/22/2020) for Dupixent injections .  IAngelique Holm, CMA, am acting as scribe for Armida Sans, MD  .  Documentation: I have reviewed the above documentation for accuracy and completeness, and I agree with the above.  Armida Sans, MD

## 2020-11-10 ENCOUNTER — Other Ambulatory Visit: Payer: Self-pay

## 2020-11-10 ENCOUNTER — Emergency Department: Payer: Medicare HMO

## 2020-11-10 ENCOUNTER — Emergency Department
Admission: EM | Admit: 2020-11-10 | Discharge: 2020-11-10 | Disposition: A | Discharge status: Home / Self Care | Payer: Medicare HMO | Attending: Emergency Medicine | Admitting: Emergency Medicine

## 2020-11-10 DIAGNOSIS — E119 Type 2 diabetes mellitus without complications: Secondary | ICD-10-CM | POA: Insufficient documentation

## 2020-11-10 DIAGNOSIS — Z7982 Long term (current) use of aspirin: Secondary | ICD-10-CM | POA: Diagnosis not present

## 2020-11-10 DIAGNOSIS — Z79899 Other long term (current) drug therapy: Secondary | ICD-10-CM | POA: Diagnosis not present

## 2020-11-10 DIAGNOSIS — R531 Weakness: Secondary | ICD-10-CM | POA: Diagnosis not present

## 2020-11-10 DIAGNOSIS — I10 Essential (primary) hypertension: Secondary | ICD-10-CM | POA: Insufficient documentation

## 2020-11-10 DIAGNOSIS — S0083XA Contusion of other part of head, initial encounter: Secondary | ICD-10-CM | POA: Diagnosis not present

## 2020-11-10 DIAGNOSIS — W19XXXA Unspecified fall, initial encounter: Secondary | ICD-10-CM | POA: Insufficient documentation

## 2020-11-10 DIAGNOSIS — Z7984 Long term (current) use of oral hypoglycemic drugs: Secondary | ICD-10-CM | POA: Insufficient documentation

## 2020-11-10 DIAGNOSIS — S60011A Contusion of right thumb without damage to nail, initial encounter: Secondary | ICD-10-CM | POA: Diagnosis not present

## 2020-11-10 DIAGNOSIS — Y92002 Bathroom of unspecified non-institutional (private) residence single-family (private) house as the place of occurrence of the external cause: Secondary | ICD-10-CM | POA: Insufficient documentation

## 2020-11-10 DIAGNOSIS — S00212A Abrasion of left eyelid and periocular area, initial encounter: Secondary | ICD-10-CM | POA: Diagnosis not present

## 2020-11-10 DIAGNOSIS — Z87891 Personal history of nicotine dependence: Secondary | ICD-10-CM | POA: Diagnosis not present

## 2020-11-10 DIAGNOSIS — S0990XA Unspecified injury of head, initial encounter: Secondary | ICD-10-CM | POA: Diagnosis present

## 2020-11-10 LAB — CBC WITH DIFFERENTIAL/PLATELET
Abs Immature Granulocytes: 0.03 10*3/uL (ref 0.00–0.07)
Basophils Absolute: 0 10*3/uL (ref 0.0–0.1)
Basophils Relative: 1 %
Eosinophils Absolute: 0.3 10*3/uL (ref 0.0–0.5)
Eosinophils Relative: 4 %
HCT: 43.4 % (ref 39.0–52.0)
Hemoglobin: 14.4 g/dL (ref 13.0–17.0)
Immature Granulocytes: 0 %
Lymphocytes Relative: 27 %
Lymphs Abs: 2 10*3/uL (ref 0.7–4.0)
MCH: 28.3 pg (ref 26.0–34.0)
MCHC: 33.2 g/dL (ref 30.0–36.0)
MCV: 85.3 fL (ref 80.0–100.0)
Monocytes Absolute: 1 10*3/uL (ref 0.1–1.0)
Monocytes Relative: 13 %
Neutro Abs: 4.1 10*3/uL (ref 1.7–7.7)
Neutrophils Relative %: 55 %
Platelets: 177 10*3/uL (ref 150–400)
RBC: 5.09 MIL/uL (ref 4.22–5.81)
RDW: 13.4 % (ref 11.5–15.5)
WBC: 7.4 10*3/uL (ref 4.0–10.5)
nRBC: 0 % (ref 0.0–0.2)

## 2020-11-10 LAB — COMPREHENSIVE METABOLIC PANEL
ALT: 12 U/L (ref 0–44)
AST: 20 U/L (ref 15–41)
Albumin: 4 g/dL (ref 3.5–5.0)
Alkaline Phosphatase: 69 U/L (ref 38–126)
Anion gap: 10 (ref 5–15)
BUN: 23 mg/dL (ref 8–23)
CO2: 25 mmol/L (ref 22–32)
Calcium: 9.4 mg/dL (ref 8.9–10.3)
Chloride: 98 mmol/L (ref 98–111)
Creatinine, Ser: 1.02 mg/dL (ref 0.61–1.24)
GFR, Estimated: >60 mL/min (ref >60)
Glucose, Bld: 173 mg/dL — ABNORMAL HIGH (ref 70–99)
Potassium: 4.3 mmol/L (ref 3.5–5.1)
Sodium: 133 mmol/L — ABNORMAL LOW (ref 135–145)
Total Bilirubin: 0.7 mg/dL (ref 0.3–1.2)
Total Protein: 7.8 g/dL (ref 6.5–8.1)

## 2020-11-10 LAB — TROPONIN I (HIGH SENSITIVITY): Troponin I (High Sensitivity): 4 ng/L (ref <18)

## 2020-11-10 LAB — URINALYSIS, COMPLETE (UACMP) WITH MICROSCOPIC
Bacteria, UA: NONE SEEN
Bilirubin Urine: NEGATIVE
Glucose, UA: >=500 mg/dL — AB
Hgb urine dipstick: NEGATIVE
Ketones, ur: NEGATIVE mg/dL
Leukocytes,Ua: NEGATIVE
Nitrite: NEGATIVE
Protein, ur: NEGATIVE mg/dL
Specific Gravity, Urine: 1.005 (ref 1.005–1.030)
Squamous Epithelial / HPF: NONE SEEN (ref 0–5)
pH: 8 (ref 5.0–8.0)

## 2020-11-10 LAB — CK: Total CK: 108 U/L (ref 49–397)

## 2020-11-10 MED ORDER — LACTATED RINGERS IV BOLUS
1000.0000 mL | Freq: Once | INTRAVENOUS | Status: AC
  Administered 2020-11-10: 1000 mL via INTRAVENOUS

## 2020-11-10 NOTE — ED Provider Notes (Signed)
[facility] Emergency Department Provider Note   ____________________________________________   Event Date/Time   First MD Initiated Contact with Patient 11/10/20 1618     (approximate)  I have reviewed the triage vital signs and the nursing notes.   HISTORY  Chief Complaint Fall    HPI Christopher [name] is a 87 y.o. male with past medical history of hypertension, hyperlipidemia, diabetes, and stroke who presents to the ED complaining of weakness and fall.  History is limited due to language barrier as patient primarily speaks [place].  He states that he fell 2 days ago in the bathroom, is not sure what caused him to fall.  He reports hitting his head but does not believe he lost consciousness.  Patient states he has been able to walk since then, but EMS reports family was concerned patient was not walking as good as usual and he also seemed more confused than usual.  He does currently take Plavix due to prior stroke.  Patient denies any headache, neck pain, facial pain, back pain, or extremity pain.  He denies any fevers, cough, chest pain, shortness of breath, dysuria, or vomiting.  He was initially brought to urgent care, who recommended patient be evaluated in the ED.        Past Medical History:  Diagnosis Date  . Arthritis   . Ataxia   . Diabetes mellitus without complication (HCC)    controlled, pt checks it in the morning   . Dyslipidemia   . Hypertension    somewhat controlled, pt checks every morning, he reports it has been good.  . Stroke (HCC)   . TIA (transient ischemic attack)     Patient Active Problem List   Diagnosis Date Noted  . Hypertensive urgency 12/31/2016  . Stroke (HCC) 10/15/2016  . TIA (transient ischemic attack) 03/07/2016  . Acute CVA (cerebrovascular accident) (HCC) 07/03/2015    Past Surgical History:  Procedure Laterality Date  . none      Prior to Admission medications   Medication Sig Start Date End Date Taking?  Authorizing Provider  amLODipine (NORVASC) 10 MG tablet Take 1 tablet (10 mg total) by mouth daily. 01/01/17   Adrian Saran, MD  aspirin (ASPIRIN CHILDRENS) 81 MG chewable tablet Chew 1 tablet (81 mg total) by mouth daily. 01/01/17   Adrian Saran, MD  clopidogrel (PLAVIX) 75 MG tablet Take 1 tablet (75 mg total) by mouth daily. 07/05/15   Katha Hamming, MD  donepezil (ARICEPT) 5 MG tablet Take 5 mg by mouth daily. 08/13/16   [provider]  dupilumab (DUPIXENT) 300 MG/2ML prefilled syringe Inject 300 mg into the skin every 14 (fourteen) days. Starting at day 15 for maintenance. 10/10/20   Deirdre Evener, MD  glipiZIDE (GLUCOTROL XL) 5 MG 24 hr tablet Take 1 tablet by mouth daily. 06/21/15   [provider]  metFORMIN (GLUCOPHAGE) 500 MG tablet Take 2 tablets by mouth 2 (two) times daily. 04/23/15   [provider]  metoprolol succinate (TOPROL-XL) 100 MG 24 hr tablet Take 1 tablet (100 mg total) by mouth daily. Take with or immediately following a meal. 01/01/17   Adrian Saran, MD  Multiple Vitamin (MULTI-VITAMINS) TABS Take 1 tablet by mouth daily.    [provider]  pravastatin (PRAVACHOL) 40 MG tablet Take 1 tablet (40 mg total) by mouth daily. 07/05/15   Katha Hamming, MD  triamcinolone cream (KENALOG) 0.1 % Apply 1 application topically 2 (two) times daily as needed. 11/05/20  Deirdre Evener, MD  vitamin B-12 (CYANOCOBALAMIN) 1000 MCG tablet Take 1,000 mcg by mouth daily.    [provider]    Allergies Hydrochlorothiazide and Penicillins  Family History  Problem Relation Age of Onset  . CAD Neg Hx   . Hypertension Neg Hx     Social History Social History   Tobacco Use  . Smoking status: Former Smoker    Types: Cigarettes  . Smokeless tobacco: Never Used  . Tobacco comment: quit 50-60 years ago  Vaping Use  . Vaping Use: Never used  Substance Use Topics  . Alcohol use: No  . Drug use: No    Review of  Systems  Constitutional: No fever/chills.  Positive for fall and confusion. Eyes: No visual changes. ENT: No sore throat. Cardiovascular: Denies chest pain. Respiratory: Denies shortness of breath. Gastrointestinal: No abdominal pain.  No nausea, no vomiting.  No diarrhea.  No constipation. Genitourinary: Negative for dysuria. Musculoskeletal: Negative for back pain. Skin: Negative for rash. Neurological: Negative for headaches, focal weakness or numbness.  ____________________________________________   PHYSICAL EXAM:  VITAL SIGNS: ED Triage Vitals  Enc Vitals Group     BP      Pulse      Resp      Temp      Temp src      SpO2      Weight      Height      Head Circumference      Peak Flow      Pain Score      Pain Loc      Pain Edu?      Excl. in GC?     Constitutional: Alert and oriented to person and place, but not time. Eyes: Conjunctivae are normal.  Extraocular movements intact.  Pupils irregular bilaterally. Head: Left periorbital ecchymosis with small abrasion lateral to left eye.  No scalp hematomas or step-offs. Nose: No congestion/rhinnorhea. Mouth/Throat: Mucous membranes are moist. Neck: Normal ROM Cardiovascular: Normal rate, regular rhythm. Grossly normal heart sounds. Respiratory: Normal respiratory effort.  No retractions. Lungs CTAB.  No chest wall tenderness to palpation. Gastrointestinal: Soft and nontender. No distention. Genitourinary: deferred Musculoskeletal: No lower extremity tenderness nor edema.  Ecchymosis and mild tenderness at base of right thumb, otherwise no upper extremity bony tenderness. Neurologic:  Normal speech and language. No gross focal neurologic deficits are appreciated. Skin:  Skin is warm, dry and intact. No rash noted. Psychiatric: Mood and affect are normal. Speech and behavior are normal.  ____________________________________________   LABS (all labs ordered are listed, but only abnormal results are  displayed)  Labs Reviewed  COMPREHENSIVE METABOLIC PANEL - Abnormal; Notable for the following components:      Result Value   Sodium 133 (*)    Glucose, Bld 173 (*)    All other components within normal limits  URINALYSIS, COMPLETE (UACMP) WITH MICROSCOPIC - Abnormal; Notable for the following components:   Color, Urine STRAW (*)    APPearance CLEAR (*)    Glucose, UA >=500 (*)    All other components within normal limits  CBC WITH DIFFERENTIAL/PLATELET  CK  TROPONIN I (HIGH SENSITIVITY)   ____________________________________________  EKG  ED ECG REPORT I, Chesley Noon, the attending physician, personally viewed and interpreted this ECG.   Date: 11/10/2020  EKG Time: 16:33  Rate: 74  Rhythm: normal sinus rhythm  Axis: LAD  Intervals:none  ST&T Change: None   PROCEDURES  Procedure(s) performed (including Critical Care):  Procedures   ____________________________________________   INITIAL IMPRESSION / ASSESSMENT AND PLAN / ED COURSE       87 year old male with past medical history of hypertension, hyperlipidemia, diabetes, and stroke who presents to the ED for reported fall 2 days ago striking his head, unclear if he lost consciousness but he does take Plavix.  We will further assess with CT head, cervical spine, and maxillofacial.  Patient has irregular pupils bilaterally, likely due to prior cataract surgery confirmed by son.   Family was also concerned that patient is more confused than usual and not walking as well as he did previously.  We will check EKG, troponin, CK, CBC, CMP, and UA.  Also check chest x-ray and x-ray of right hand.  CT head, cervical spine, and maxillofacial are negative for acute process.  Chest x-ray and x-ray of right hand reviewed by me with no infiltrate, edema, effusion, fracture, or dislocation.  Lab work is unremarkable, UA shows no signs of infection.  Son at bedside confirms patient is at his baseline mental status.  He is  appropriate for discharge home with PCP follow-up, son counseled to have him return to the ED for new or worsening symptoms.      ____________________________________________   FINAL CLINICAL IMPRESSION(S) / ED DIAGNOSES  Final diagnoses:  Fall, initial encounter  Contusion of face, initial encounter     ED Discharge Orders    None       Note:  This document was prepared using Dragon voice recognition software and may include unintentional dictation errors.   Chesley Noon, MD 11/10/20 2022

## 2020-11-10 NOTE — ED Notes (Signed)
Pt given orange juice and [place] sandwich tray per request at this time. PO intake okayed by MD Larinda Buttery at this time.

## 2020-11-10 NOTE — ED Triage Notes (Signed)
Pt arrives [facility] from home. Fall 2 days ago, found in bathroom. Denied LOC to EMS. Bruises noted to L eye. R thumb, elbow and hip pain is what pt told son. Pt ambulated with EMS. Son told EMS that pt "isn't talking or walking like he used to". Hx BP issues, unsure if low or high. VSS with EMS. EMS states that family took pt to [facility] and they told family to take pt to ER. Family took pt home and called 911. EMS reports preferred language is arabic but does speak some english.

## 2020-11-12 ENCOUNTER — Encounter: Payer: Self-pay | Admitting: Dermatology

## 2020-11-22 ENCOUNTER — Ambulatory Visit (INDEPENDENT_AMBULATORY_CARE_PROVIDER_SITE_OTHER): Payer: Medicare HMO | Admitting: Dermatology

## 2020-11-22 ENCOUNTER — Other Ambulatory Visit: Payer: Self-pay

## 2020-11-22 DIAGNOSIS — L2081 Atopic neurodermatitis: Secondary | ICD-10-CM | POA: Diagnosis not present

## 2020-11-22 MED ORDER — DUPILUMAB 300 MG/2ML ~~LOC~~ SOSY
300.0000 mg | PREFILLED_SYRINGE | Freq: Once | SUBCUTANEOUS | Status: AC
  Administered 2020-11-22: 300 mg via SUBCUTANEOUS

## 2020-11-22 MED ORDER — DUPIXENT 300 MG/2ML ~~LOC~~ SOAJ
300.0000 mg | SUBCUTANEOUS | 6 refills | Status: DC
Start: 2020-11-22 — End: 2021-10-24

## 2020-11-22 NOTE — Progress Notes (Signed)
   Follow-Up Visit   Subjective  Christopher [name] is a 87 y.o. male who presents for the following: Dermatitis (Trunk, extremities, dupixent sq injections q 2wks).  Patient accompanied by son who contributes to history.  The following portions of the chart were reviewed this encounter and updated as appropriate:   Tobacco  Allergies  Meds  Problems  Med Hx  Surg Hx  Fam Hx     Review of Systems:  No other skin or systemic complaints except as noted in HPI or Assessment and Plan.  Objective  Well appearing patient in no apparent distress; mood and affect are within normal limits.  A focused examination was performed including trunk, arms. Relevant physical exam findings are noted in the Assessment and Plan.  Objective  trunk, extremities: Hyperpigmentation arms, back, no crust   Assessment & Plan  Atopic neurodermatitis-much improved with decreased itching decreased excoriations.  Not to goal.  The son does see a significant improvement. trunk, extremities  Atopic dermatitis - Severe, on Dupixent (biologic medication).  Atopic dermatitis (eczema) is a chronic, relapsing, pruritic condition that can significantly affect quality of life. It is often associated with allergic rhinitis and/or asthma and can require treatment with topical medications, phototherapy, or in severe cases a biologic medication called Dupixent.    Pt started Dupixent 10/10/20  Cont Dupixent 300mg /33ml sq injections q 2 wks. Patient's son will learn how to give injections at f/u nurse appointment.  Dupixent 300mg /56ml sq injection today to L upper arm. Lot [id number]  dupilumab (DUPIXENT) prefilled syringe 300 mg - trunk, extremities  Dupilumab (DUPIXENT) 300 MG/2ML SOPN - trunk, extremities  Other Related Medications dupilumab (DUPIXENT) 300 MG/2ML prefilled syringe triamcinolone cream (KENALOG) 0.1 %  Return in about 2 weeks (around 12/06/2020) for Nurse for Dupixent injection and teach son to  give injection, 76m with Dr. 02/05/2021 for , Atopic Derm.   I, 3m, RMA, am acting as scribe for Gwen Pounds, MD .  Documentation: I have reviewed the above documentation for accuracy and completeness, and I agree with the above.  Ardis Rowan, MD

## 2020-11-22 NOTE — Patient Instructions (Signed)

## 2020-11-30 ENCOUNTER — Encounter: Payer: Self-pay | Admitting: Dermatology

## 2020-12-06 ENCOUNTER — Ambulatory Visit (INDEPENDENT_AMBULATORY_CARE_PROVIDER_SITE_OTHER): Payer: Medicare HMO

## 2020-12-06 ENCOUNTER — Other Ambulatory Visit: Payer: Self-pay

## 2020-12-06 DIAGNOSIS — L2081 Atopic neurodermatitis: Secondary | ICD-10-CM | POA: Diagnosis not present

## 2020-12-06 MED ORDER — DUPILUMAB 300 MG/2ML ~~LOC~~ SOAJ
300.0000 mg | Freq: Once | SUBCUTANEOUS | Status: AC
  Administered 2020-12-06: 300 mg via SUBCUTANEOUS

## 2020-12-06 NOTE — Patient Instructions (Signed)

## 2020-12-06 NOTE — Progress Notes (Signed)
   Follow-Up Visit   Subjective  Christopher [name] is a 87 y.o. male who presents for the following: Follow-up (Pt here for Dupixent injection and injection training).  Son with patient to learn how to inject at home   The following portions of the chart were reviewed this encounter and updated as appropriate:       Review of Systems:  No other skin or systemic complaints except as noted in HPI or Assessment and Plan.  Objective  Well appearing patient in no apparent distress; mood and affect are within normal limits.  A focused examination was performed including right arm. Relevant physical exam findings are noted in the Assessment and Plan.    Assessment & Plan  Atopic neurodermatitis trunk, extremities  Atopic neurodermatitis-much improved with decreased itching decreased excoriations.  Not to goal.    Atopic dermatitis - Severe, on Dupixent (biologic medication).  Atopic dermatitis (eczema) is a chronic, relapsing, pruritic condition that can significantly affect quality of life. It is often associated with allergic rhinitis and/or asthma and can require treatment with topical medications, phototherapy, or in severe cases a biologic medication called Dupixent.     Patient son injected Dupixent 300 mg/51ml to the R upper arm Patient tolerated injection well Lot #[id number] Exp 11/2022  Pt son advised to inject Dupixent injection every 14 days,   Other Related Medications dupilumab (DUPIXENT) 300 MG/2ML prefilled syringe triamcinolone cream (KENALOG) 0.1 % Dupilumab (DUPIXENT) 300 MG/2ML SOPN  Return for as scheduled .  I, Angelique Holm, CMA, am acting as scribe for [employer] .

## 2021-01-24 ENCOUNTER — Ambulatory Visit: Payer: Medicare HMO | Admitting: Dermatology

## 2021-04-05 ENCOUNTER — Other Ambulatory Visit: Payer: Self-pay | Admitting: Neurology

## 2021-04-05 DIAGNOSIS — R413 Other amnesia: Secondary | ICD-10-CM

## 2021-04-16 ENCOUNTER — Ambulatory Visit
Admission: RE | Admit: 2021-04-16 | Discharge: 2021-04-16 | Disposition: A | Discharge status: Home / Self Care | Payer: Medicare HMO | Source: Ambulatory Visit | Attending: Neurology | Admitting: Neurology

## 2021-04-16 ENCOUNTER — Other Ambulatory Visit: Payer: Self-pay

## 2021-04-16 DIAGNOSIS — R413 Other amnesia: Secondary | ICD-10-CM | POA: Diagnosis not present

## 2021-05-06 ENCOUNTER — Other Ambulatory Visit: Payer: Self-pay

## 2021-05-06 DIAGNOSIS — L2081 Atopic neurodermatitis: Secondary | ICD-10-CM

## 2021-05-06 MED ORDER — DUPIXENT 300 MG/2ML ~~LOC~~ SOSY: 300.0000 mg | PREFILLED_SYRINGE | SUBCUTANEOUS | 0 refills | Status: AC

## 2021-05-13 ENCOUNTER — Ambulatory Visit: Payer: Medicare HMO | Admitting: Dermatology

## 2021-10-24 ENCOUNTER — Emergency Department: Payer: Medicare HMO

## 2021-10-24 ENCOUNTER — Observation Stay
Admission: EM | Admit: 2021-10-24 | Discharge: 2021-10-25 | Disposition: A | Discharge status: Home / Self Care | Payer: Medicare HMO | Attending: Internal Medicine | Admitting: Internal Medicine

## 2021-10-24 DIAGNOSIS — E785 Hyperlipidemia, unspecified: Secondary | ICD-10-CM

## 2021-10-24 DIAGNOSIS — Z20822 Contact with and (suspected) exposure to covid-19: Secondary | ICD-10-CM | POA: Insufficient documentation

## 2021-10-24 DIAGNOSIS — A4150 Gram-negative sepsis, unspecified: Secondary | ICD-10-CM | POA: Diagnosis not present

## 2021-10-24 DIAGNOSIS — A419 Sepsis, unspecified organism: Secondary | ICD-10-CM | POA: Diagnosis not present

## 2021-10-24 DIAGNOSIS — S065XAA Traumatic subdural hemorrhage with loss of consciousness status unknown, initial encounter: Secondary | ICD-10-CM | POA: Diagnosis not present

## 2021-10-24 DIAGNOSIS — R2681 Unsteadiness on feet: Secondary | ICD-10-CM | POA: Insufficient documentation

## 2021-10-24 DIAGNOSIS — E119 Type 2 diabetes mellitus without complications: Secondary | ICD-10-CM | POA: Diagnosis not present

## 2021-10-24 DIAGNOSIS — R531 Weakness: Secondary | ICD-10-CM | POA: Diagnosis present

## 2021-10-24 DIAGNOSIS — F32A Depression, unspecified: Secondary | ICD-10-CM

## 2021-10-24 DIAGNOSIS — I6200 Nontraumatic subdural hemorrhage, unspecified: Secondary | ICD-10-CM | POA: Diagnosis not present

## 2021-10-24 DIAGNOSIS — K219 Gastro-esophageal reflux disease without esophagitis: Secondary | ICD-10-CM

## 2021-10-24 DIAGNOSIS — I10 Essential (primary) hypertension: Secondary | ICD-10-CM | POA: Diagnosis not present

## 2021-10-24 DIAGNOSIS — N390 Urinary tract infection, site not specified: Secondary | ICD-10-CM

## 2021-10-24 LAB — COMPREHENSIVE METABOLIC PANEL
ALT: 16 U/L (ref 0–44)
AST: 24 U/L (ref 15–41)
Albumin: 3.8 g/dL (ref 3.5–5.0)
Alkaline Phosphatase: 83 U/L (ref 38–126)
Anion gap: 10 (ref 5–15)
BUN: 23 mg/dL (ref 8–23)
CO2: 27 mmol/L (ref 22–32)
Calcium: 9.5 mg/dL (ref 8.9–10.3)
Chloride: 96 mmol/L — ABNORMAL LOW (ref 98–111)
Creatinine, Ser: 0.97 mg/dL (ref 0.61–1.24)
GFR, Estimated: >60 mL/min (ref >60)
Glucose, Bld: 272 mg/dL — ABNORMAL HIGH (ref 70–99)
Potassium: 4.9 mmol/L (ref 3.5–5.1)
Sodium: 133 mmol/L — ABNORMAL LOW (ref 135–145)
Total Bilirubin: 0.5 mg/dL (ref 0.3–1.2)
Total Protein: 8.3 g/dL — ABNORMAL HIGH (ref 6.5–8.1)

## 2021-10-24 LAB — CBC WITH DIFFERENTIAL/PLATELET
Abs Immature Granulocytes: 0.08 10*3/uL — ABNORMAL HIGH (ref 0.00–0.07)
Basophils Absolute: 0 10*3/uL (ref 0.0–0.1)
Basophils Relative: 0 %
Eosinophils Absolute: 0 10*3/uL (ref 0.0–0.5)
Eosinophils Relative: 0 %
HCT: 42.8 % (ref 39.0–52.0)
Hemoglobin: 14 g/dL (ref 13.0–17.0)
Immature Granulocytes: 1 %
Lymphocytes Relative: 7 %
Lymphs Abs: 0.9 10*3/uL (ref 0.7–4.0)
MCH: 28 pg (ref 26.0–34.0)
MCHC: 32.7 g/dL (ref 30.0–36.0)
MCV: 85.6 fL (ref 80.0–100.0)
Monocytes Absolute: 1.2 10*3/uL — ABNORMAL HIGH (ref 0.1–1.0)
Monocytes Relative: 9 %
Neutro Abs: 10.9 10*3/uL — ABNORMAL HIGH (ref 1.7–7.7)
Neutrophils Relative %: 83 %
Platelets: 206 10*3/uL (ref 150–400)
RBC: 5 MIL/uL (ref 4.22–5.81)
RDW: 13.6 % (ref 11.5–15.5)
WBC: 13.1 10*3/uL — ABNORMAL HIGH (ref 4.0–10.5)
nRBC: 0 % (ref 0.0–0.2)

## 2021-10-24 LAB — URINALYSIS, COMPLETE (UACMP) WITH MICROSCOPIC
Bilirubin Urine: NEGATIVE
Glucose, UA: >=500 mg/dL — AB
Ketones, ur: NEGATIVE mg/dL
Leukocytes,Ua: NEGATIVE
Nitrite: NEGATIVE
Protein, ur: NEGATIVE mg/dL
RBC / HPF: >50 RBC/hpf — ABNORMAL HIGH (ref 0–5)
Specific Gravity, Urine: 1.016 (ref 1.005–1.030)
pH: 7 (ref 5.0–8.0)

## 2021-10-24 LAB — LACTIC ACID, PLASMA
Lactic Acid, Venous: 2.8 mmol/L (ref 0.5–1.9)
Lactic Acid, Venous: 2.3 mmol/L (ref 0.5–1.9)

## 2021-10-24 LAB — RESP PANEL BY RT-PCR (FLU A&B, COVID) ARPGX2
Influenza A by PCR: NEGATIVE
Influenza B by PCR: NEGATIVE
SARS Coronavirus 2 by RT PCR: NEGATIVE

## 2021-10-24 LAB — PROCALCITONIN: Procalcitonin: 0.13 ng/mL

## 2021-10-24 MED ORDER — SODIUM CHLORIDE 0.9 % IV BOLUS (SEPSIS)
1000.0000 mL | Freq: Once | INTRAVENOUS | Status: AC
  Administered 2021-10-24: 1000 mL via INTRAVENOUS

## 2021-10-24 MED ORDER — ACETAMINOPHEN 500 MG PO TABS
1000.0000 mg | ORAL_TABLET | Freq: Once | ORAL | Status: AC
  Administered 2021-10-24: 1000 mg via ORAL
  Filled 2021-10-24: qty 2

## 2021-10-24 MED ORDER — SODIUM CHLORIDE 0.9 % IV SOLN
2.0000 g | INTRAVENOUS | Status: DC
  Administered 2021-10-24: 2 g via INTRAVENOUS
  Filled 2021-10-24: qty 20

## 2021-10-24 NOTE — ED Triage Notes (Signed)
88 y/o male arrived to the [facility] via EMS coming from home with a CC of generalized weakness and multiple falls. EMS states pt is A&Ox4 but only speaks [place] an [place] dialect. Pt son states he is worried about fluid in his lungs. EMS reports diminished lung sounds. Pt has a history of strokes and pt son is also concerned that he has had another TIA. [id number] with EMS. No none defects per EMS ?

## 2021-10-24 NOTE — ED Provider Notes (Signed)
? ?[facility] ?Provider Note ? ? ? Event Date/Time  ? First MD Initiated Contact with Patient 10/24/21 2325   ?  (approximate) ? ? ?History  ? ?Weakness ? ? ?HPI ? ?Christopher [name] is a 88 y.o. male with a history of stroke, hypertension, diabetes who is brought to the ED due to generalized weakness and multiple falls over the last several days.  Patient's son reports lower extremity swelling and worried about fluid in the lungs.  No vomiting or diarrhea. ?  ? ? ?Physical Exam  ? ?Triage Vital Signs: ?ED Triage Vitals  ?Enc Vitals Group  ?   BP 10/24/21 2023 126/80  ?   Pulse Rate 10/24/21 2023 (!) 101  ?   Resp 10/24/21 2023 20  ?   Temp 10/24/21 2023 (!) 101.9 ?F (38.8 ?C)  ?   Temp Source 10/24/21 2023 Oral  ?   SpO2 10/24/21 2023 100 %  ?   Weight 10/24/21 2024 132 lb 11.2 oz (60.2 kg)  ?   Height --   ?   Head Circumference --   ?   Peak Flow --   ?   Pain Score --   ?   Pain Loc --   ?   Pain Edu? --   ?   Excl. in GC? --   ? ? ?Most recent vital signs: ?Vitals:  ? 10/24/21 2023 10/24/21 2221  ?BP: 126/80 125/68  ?Pulse: (!) 101 93  ?Resp: 20 18  ?Temp: (!) 101.9 ?F (38.8 ?C)   ?SpO2: 100% 93%  ? ? ? ?General: Awake, no distress.  Dry mucous membranes ?CV:  Good peripheral perfusion.  Tachycardia heart rate 105. ?Resp:  Normal effort.  Clear to auscultation bilaterally. ?Abd:  No distention.  Soft nontender ?Other:  No significant signs of trauma. ? ? ?ED Results / Procedures / Treatments  ? ?Labs ?(all labs ordered are listed, but only abnormal results are displayed) ?Labs Reviewed  ?COMPREHENSIVE METABOLIC PANEL - Abnormal; Notable for the following components:  ?    Result Value  ? Sodium 133 (*)   ? Chloride 96 (*)   ? Glucose, Bld 272 (*)   ? Total Protein 8.3 (*)   ? All other components within normal limits  ?CBC WITH DIFFERENTIAL/PLATELET - Abnormal; Notable for the following components:  ? WBC 13.1 (*)   ? Neutro Abs 10.9 (*)   ? Monocytes Absolute 1.2 (*)   ? Abs Immature  Granulocytes 0.08 (*)   ? All other components within normal limits  ?LACTIC ACID, PLASMA - Abnormal; Notable for the following components:  ? Lactic Acid, Venous 2.8 (*)   ? All other components within normal limits  ?LACTIC ACID, PLASMA - Abnormal; Notable for the following components:  ? Lactic Acid, Venous 2.3 (*)   ? All other components within normal limits  ?URINALYSIS, COMPLETE (UACMP) WITH MICROSCOPIC - Abnormal; Notable for the following components:  ? Color, Urine YELLOW (*)   ? APPearance CLEAR (*)   ? Glucose, UA >=500 (*)   ? Hgb urine dipstick LARGE (*)   ? RBC / HPF >50 (*)   ? Bacteria, UA RARE (*)   ? All other components within normal limits  ?RESP PANEL BY RT-PCR (FLU A&B, COVID) [id number]  ?CULTURE, BLOOD (ROUTINE X 2)  ?CULTURE, BLOOD (ROUTINE X 2)  ?URINE CULTURE  ?PROCALCITONIN  ? ? ? ?EKG ? ?Interpreted by me ?Sinus rhythm, rate of 94.  Left axis,  normal intervals.  Poor R wave progression.  Normal ST segments and T waves. ? ? ?RADIOLOGY ?Chest x-ray viewed and interpreted by me, unremarkable.  Radiology report reviewed. ? ?CT head and cervical spine viewed by me, shows a small right parietal subdural hematoma.  Discussed with radiologist who confirms 5 mm subdural hematoma without mass effect.  C-spine unremarkable.  Radiology report reviewed. ? ? ? ?PROCEDURES: ? ?Critical Care performed: Yes, see critical care procedure note(s) ? ?.Critical Care ?Performed by: Sharman Cheek, MD ?Authorized by: Sharman Cheek, MD  ? ?Critical care provider statement:  ?  Critical care time (minutes):  35 ?  Critical care time was exclusive of:  Separately billable procedures and treating other patients ?  Critical care was necessary to treat or prevent imminent or life-threatening deterioration of the following conditions:  Sepsis, dehydration and trauma ?  Critical care was time spent personally by me on the following activities:  Development of treatment plan with patient or surrogate, discussions  with consultants, evaluation of patient's response to treatment, examination of patient, obtaining history from patient or surrogate, ordering and performing treatments and interventions, ordering and review of laboratory studies, ordering and review of radiographic studies, pulse oximetry, re-evaluation of patient's condition and review of old charts ?  Care discussed with: admitting provider   ? ? ?MEDICATIONS ORDERED IN ED: ?Medications  ?cefTRIAXone (ROCEPHIN) 2 g in sodium chloride 0.9 % 100 mL IVPB (0 g Intravenous Stopped 10/24/21 2159)  ?sodium chloride 0.9 % bolus 1,000 mL (0 mLs Intravenous Stopped 10/24/21 2159)  ?acetaminophen (TYLENOL) tablet 1,000 mg (1,000 mg Oral Given 10/24/21 2222)  ? ? ? ?IMPRESSION / MDM / ASSESSMENT AND PLAN / ED COURSE  ?I reviewed the triage vital signs and the nursing notes. ?             ?               ? ?Differential diagnosis includes, but is not limited to, UTI, pneumonia, sepsis, dehydration, electrolyte abnormality, intracranial hemorrhage, C-spine fracture ? ?Patient presents with generalized weakness and multiple falls.  Vitals show fever and tachycardia, concern for sepsis.  We will need to check labs, cultures, give IV Rocephin and IV fluids. ? ? ?Clinical Course as of 10/24/21 2338  ?Thu Oct 24, 2021  ?2155 CT head/neck d/w radiology who notes 22mm R parietal SDH. Will d/w NSGY, plan to admit.  [PS]  ?  ?Clinical Course User Index ?[PS] Sharman Cheek, MD  ? ? ?----------------------------------------- ?11:40 PM on 10/24/2021 ?----------------------------------------- ?Lab panel shows signs of a urinary tract infection, elevated lactic acid level.  Case discussed with hospitalist for further management of sepsis.  Repeat CT head ordered for 3:30 AM for 6-hour interval. ? ?Repeat lactic acid level has improved from 2.8-2.3.  No further trending necessary unless patient deteriorates clinically. ? ? ?FINAL CLINICAL IMPRESSION(S) / ED DIAGNOSES  ? ?Final diagnoses:   ?Sepsis, due to unspecified organism, unspecified whether acute organ dysfunction present [facility])  ?Subdural hematoma (HCC)  ? ? ? ?Rx / DC Orders  ? ?ED Discharge Orders   ? ? None  ? ?  ? ? ? ?Note:  This document was prepared using Dragon voice recognition software and may include unintentional dictation errors. ?  ?Sharman Cheek, MD ?10/24/21 2342 ? ?

## 2021-10-24 NOTE — Sepsis Progress Note (Signed)
Following per sepsis protocol   

## 2021-10-24 NOTE — Progress Notes (Signed)
CODE SEPSIS - PHARMACY COMMUNICATION ? ?**Broad Spectrum Antibiotics should be administered within 1 hour of Sepsis diagnosis** ? ?Time Code Sepsis Called/Page Received: 2041 ? ?Antibiotics Ordered: Ceftriaxone ? ?Time of 1st antibiotic administration: 2136 ? ?Additional action taken by pharmacy: N/A ? ?Tressie Ellis ?10/24/2021  8:43 PM  ?

## 2021-10-25 ENCOUNTER — Inpatient Hospital Stay: Payer: Medicare HMO

## 2021-10-25 DIAGNOSIS — E119 Type 2 diabetes mellitus without complications: Secondary | ICD-10-CM

## 2021-10-25 DIAGNOSIS — N390 Urinary tract infection, site not specified: Secondary | ICD-10-CM | POA: Diagnosis not present

## 2021-10-25 DIAGNOSIS — I10 Essential (primary) hypertension: Secondary | ICD-10-CM

## 2021-10-25 DIAGNOSIS — K219 Gastro-esophageal reflux disease without esophagitis: Secondary | ICD-10-CM

## 2021-10-25 DIAGNOSIS — F32A Depression, unspecified: Secondary | ICD-10-CM

## 2021-10-25 DIAGNOSIS — S065XAA Traumatic subdural hemorrhage with loss of consciousness status unknown, initial encounter: Secondary | ICD-10-CM

## 2021-10-25 DIAGNOSIS — A419 Sepsis, unspecified organism: Secondary | ICD-10-CM | POA: Diagnosis present

## 2021-10-25 DIAGNOSIS — A4150 Gram-negative sepsis, unspecified: Secondary | ICD-10-CM | POA: Diagnosis not present

## 2021-10-25 DIAGNOSIS — E785 Hyperlipidemia, unspecified: Secondary | ICD-10-CM

## 2021-10-25 LAB — BASIC METABOLIC PANEL
Anion gap: 8 (ref 5–15)
BUN: 21 mg/dL (ref 8–23)
CO2: 26 mmol/L (ref 22–32)
Calcium: 8.5 mg/dL — ABNORMAL LOW (ref 8.9–10.3)
Chloride: 101 mmol/L (ref 98–111)
Creatinine, Ser: 0.92 mg/dL (ref 0.61–1.24)
GFR, Estimated: >60 mL/min (ref >60)
Glucose, Bld: 141 mg/dL — ABNORMAL HIGH (ref 70–99)
Potassium: 4 mmol/L (ref 3.5–5.1)
Sodium: 135 mmol/L (ref 135–145)

## 2021-10-25 LAB — HEMOGLOBIN A1C
Hgb A1c MFr Bld: 7.6 % — ABNORMAL HIGH (ref 4.8–5.6)
Mean Plasma Glucose: 171.42 mg/dL

## 2021-10-25 LAB — PROTIME-INR
INR: 1.1 (ref 0.8–1.2)
Prothrombin Time: 13.8 seconds (ref 11.4–15.2)

## 2021-10-25 LAB — CBG MONITORING, ED: Glucose-Capillary: 269 mg/dL — ABNORMAL HIGH (ref 70–99)

## 2021-10-25 LAB — CBC
HCT: 37.7 % — ABNORMAL LOW (ref 39.0–52.0)
Hemoglobin: 12.4 g/dL — ABNORMAL LOW (ref 13.0–17.0)
MCH: 28.2 pg (ref 26.0–34.0)
MCHC: 32.9 g/dL (ref 30.0–36.0)
MCV: 85.9 fL (ref 80.0–100.0)
Platelets: 172 10*3/uL (ref 150–400)
RBC: 4.39 MIL/uL (ref 4.22–5.81)
RDW: 13.6 % (ref 11.5–15.5)
WBC: 13.3 10*3/uL — ABNORMAL HIGH (ref 4.0–10.5)
nRBC: 0 % (ref 0.0–0.2)

## 2021-10-25 LAB — PROCALCITONIN: Procalcitonin: 0.12 ng/mL

## 2021-10-25 LAB — LACTIC ACID, PLASMA: Lactic Acid, Venous: 2.2 mmol/L (ref 0.5–1.9)

## 2021-10-25 MED ORDER — ACETAMINOPHEN 325 MG PO TABS: 650.0000 mg | ORAL_TABLET | Freq: Four times a day (QID) | ORAL | Status: DC | PRN

## 2021-10-25 MED ORDER — SODIUM CHLORIDE 0.9 % IV SOLN: 2.0000 g | INTRAVENOUS | Status: DC

## 2021-10-25 MED ORDER — MAGNESIUM HYDROXIDE 400 MG/5ML PO SUSP: 30.0000 mL | Freq: Every day | ORAL | Status: DC | PRN

## 2021-10-25 MED ORDER — AMLODIPINE BESYLATE 5 MG PO TABS
10.0000 mg | ORAL_TABLET | Freq: Every day | ORAL | Status: DC
  Administered 2021-10-25: 10 mg via ORAL
  Filled 2021-10-25: qty 2

## 2021-10-25 MED ORDER — SENNOSIDES-DOCUSATE SODIUM 8.6-50 MG PO TABS
1.0000 | ORAL_TABLET | Freq: Two times a day (BID) | ORAL | Status: DC
  Administered 2021-10-25: 1 via ORAL
  Filled 2021-10-25: qty 1

## 2021-10-25 MED ORDER — ADULT MULTIVITAMIN W/MINERALS CH
1.0000 | ORAL_TABLET | Freq: Every day | ORAL | Status: DC
  Administered 2021-10-25: 1 via ORAL
  Filled 2021-10-25: qty 1

## 2021-10-25 MED ORDER — STROKE: EARLY STAGES OF RECOVERY BOOK: Freq: Once | Status: DC

## 2021-10-25 MED ORDER — ONDANSETRON HCL 4 MG/2ML IJ SOLN
4.0000 mg | Freq: Four times a day (QID) | INTRAMUSCULAR | Status: DC | PRN
Start: 2021-10-25 — End: 2021-10-25

## 2021-10-25 MED ORDER — LINAGLIPTIN 5 MG PO TABS
5.0000 mg | ORAL_TABLET | Freq: Every day | ORAL | Status: DC
  Administered 2021-10-25: 5 mg via ORAL
  Filled 2021-10-25: qty 1

## 2021-10-25 MED ORDER — SODIUM CHLORIDE 0.9 % IV SOLN: INTRAVENOUS | Status: DC

## 2021-10-25 MED ORDER — TRAZODONE HCL 50 MG PO TABS: 25.0000 mg | ORAL_TABLET | Freq: Every evening | ORAL | Status: DC | PRN

## 2021-10-25 MED ORDER — INSULIN ASPART 100 UNIT/ML IJ SOLN: 0.0000 [IU] | Freq: Every day | INTRAMUSCULAR | Status: DC

## 2021-10-25 MED ORDER — SERTRALINE HCL 50 MG PO TABS: 25.0000 mg | ORAL_TABLET | Freq: Every day | ORAL | Status: DC

## 2021-10-25 MED ORDER — PRAVASTATIN SODIUM 20 MG PO TABS: 40.0000 mg | ORAL_TABLET | Freq: Every day | ORAL | Status: DC

## 2021-10-25 MED ORDER — ONDANSETRON HCL 4 MG PO TABS: 4.0000 mg | ORAL_TABLET | Freq: Four times a day (QID) | ORAL | Status: DC | PRN

## 2021-10-25 MED ORDER — METOPROLOL SUCCINATE ER 50 MG PO TB24
100.0000 mg | ORAL_TABLET | Freq: Every day | ORAL | Status: DC
  Administered 2021-10-25: 100 mg via ORAL
  Filled 2021-10-25: qty 2

## 2021-10-25 MED ORDER — PANTOPRAZOLE SODIUM 40 MG IV SOLR
40.0000 mg | Freq: Every day | INTRAVENOUS | Status: DC
  Administered 2021-10-25: 40 mg via INTRAVENOUS

## 2021-10-25 MED ORDER — CEPHALEXIN 500 MG PO CAPS: 500.0000 mg | ORAL_CAPSULE | Freq: Two times a day (BID) | ORAL | 0 refills | Status: AC

## 2021-10-25 MED ORDER — INSULIN ASPART 100 UNIT/ML IJ SOLN
0.0000 [IU] | Freq: Three times a day (TID) | INTRAMUSCULAR | Status: DC
  Administered 2021-10-25: 5 [IU] via SUBCUTANEOUS
  Filled 2021-10-25: qty 1

## 2021-10-25 MED ORDER — DONEPEZIL HCL 5 MG PO TABS
5.0000 mg | ORAL_TABLET | Freq: Every day | ORAL | Status: DC
  Administered 2021-10-25: 5 mg via ORAL
  Filled 2021-10-25: qty 1

## 2021-10-25 MED ORDER — EMPAGLIFLOZIN 10 MG PO TABS
10.0000 mg | ORAL_TABLET | Freq: Every day | ORAL | Status: DC
  Administered 2021-10-25: 10 mg via ORAL
  Filled 2021-10-25: qty 1

## 2021-10-25 MED ORDER — PANTOPRAZOLE SODIUM 40 MG PO TBEC: 40.0000 mg | DELAYED_RELEASE_TABLET | Freq: Every day | ORAL | Status: DC

## 2021-10-25 MED ORDER — ACETAMINOPHEN 650 MG RE SUPP: 650.0000 mg | Freq: Four times a day (QID) | RECTAL | Status: DC | PRN

## 2021-10-25 NOTE — ED Notes (Signed)
Pt assisted with bedpan and pt had large BM. Pt cleaned and repositioned in bed. ?

## 2021-10-25 NOTE — ED Notes (Signed)
MD messaged regarding family at bedside and wanting update. ?

## 2021-10-25 NOTE — Assessment & Plan Note (Signed)
-   We will continue PPI therapy 

## 2021-10-25 NOTE — Discharge Summary (Addendum)
?Physician Discharge Summary ?  ?Patient: Christopher [name] MRN: [id number] DOB: [birth year]  ?Admit date:     10/24/2021  ?Discharge date: 10/25/21  ?Discharge Physician: Lorella Nimrod  ? ?PCP: Donnie Coffin, MD  ? ?Recommendations at discharge:  ?Please follow-up with primary care provider within a week. ?Please repeat UA in 1 to 2 weeks to ensure resolution of hematuria. ?Patient will need referral to urology if microscopic hematuria persists ?His Jardiance was discontinued for concern of UTI, please evaluate the need for adding another agent for better control of diabetes. ?Patient also had a small subdural hematoma which need to be managed conservatively. ? ?Discharge Diagnoses: ?Principal Problem: ?  Sepsis due to gram-negative UTI (Christopher [name]) ?Active Problems: ?  Subdural hematoma (H[facility]) ?  Type 2 diabetes mellitus without complications (Christopher [name]) ?  Essential hypertension ?  Dyslipidemia ?  Depression ?  GERD without esophagitis ?  Sepsis (Christopher [name]) ? ? ?Hospital Course: ?Christopher [name] is a 88 y.o. [place] male with medical history significant for type diabetes mellitus, hypertension, dyslipidemia, CVA/TIA, who presented to the ER with acute onset of generalized weakness and recent falls as well as fever and chills.  The patient admitted to urinary frequency and urgency without dysuria or hematuria or flank pain.  He is [place] and his son was offering translation.  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  He denied any chest pain or palpitations or cough or wheezing or dyspnea.  He admitted to hitting his head during one of his falls without loss of consciousness.  No other injuries.  His son noticed that he was slightly confused after his fall. ? ?On arrival to ED he had low-grade fever at 100.  Mild hyponatremia, hypochloremia and hyperglycemia.  Lactic acid was elevated at 2.83 which improved after getting IV fluid.  UA concerning for UTI so he was started on ceftriaxone and then discharged on Keflex. ?Later urine  culture with no growth. ?Patient had microscopic hematuria which can be due to cystitis.  He was advised to have a repeat UA done in about 2 weeks and if persistence of hematuria he should follow-up with urology for further recommendations and a possible cystoscopy. ? ?CT head also noted to have a small right sided early-subacute subdural hematoma with no mass effect.  Neurosurgery was consulted and CT scan was repeated in 6 hours which shows stable subdural hematoma and no progression.  Neurosurgery advised conservative management and no surgical intervention needed at this time. ? ?Our physical therapist recommended outpatient PT, referral was provided ? ?Son was also concerned about some sounds during swallowing, he was evaluated by our swallow team and no concerns were found. ? ?Patient will continue with the rest of current management and follow-up with his providers. ? ? ?Assessment and Plan: ?* Sepsis due to gram-negative UTI ([place]) ?- The patient will be admitted to the stepdown unit bed. ?- Sepsis is manifested by leukocytosis, fever, tachycardia and tachypnea. ?- His criteria for severe sepsis given lactic acid of 2.8. ?- Continue hydration with IV normal saline and antibiotic therapy with IV Rocephin. ?- We will follow blood and urine cultures. ?- This could be the culprit for his generalized weakness and recurrent falls. ? ?Subdural hematoma (Christopher [name]) ?- The patient is admitted to stepdown unit bed. ?- Neurochecks will be followed. ?- Repeat head CT scan was performed and is resulted above. ?- Neurosurgery consult will be obtained. ?- Dr. Izora Ribas was notified about the patient. ?- All blood thinners will  be held off. ? ?Type 2 diabetes mellitus without complications ([place]) ?- We will place the patient on supplement coverage with NovoLog. ?- We will continue Jardiance and Glucotrol XL while holding off metformin. ? ?GERD without esophagitis ?- We will continue PPI therapy. ? ?Depression ?- We will continue  Zoloft. ? ?Dyslipidemia ?- We will continue statin therapy. ? ?Essential hypertension ?- We will continue his antihypertensives. ? ? ?Consultants: Neurosurgery ?Procedures performed: None ?Disposition: Home ?Diet recommendation:  ?Discharge Diet Orders (From admission, onward)  ? ?  Start     Ordered  ? 10/25/21 0000  Diet - low sodium heart healthy       ? 10/25/21 1303  ? ?  ?  ? ?  ? ?Cardiac and Carb modified diet ?DISCHARGE MEDICATION: ?Allergies as of 10/25/2021   ? ?   Reactions  ? Hydrochlorothiazide   ? Unknown reaction  ? Beef-derived Products Other (See Comments)  ? religion  ? Penicillins Other (See Comments)  ? Reaction unknown.   ? Pork-derived Products Other (See Comments)  ? religion  ? ?  ? ?  ?Medication List  ?  ? ?STOP taking these medications   ? ?aspirin 81 MG chewable tablet ?Commonly known as: Aspirin Childrens ?  ?clopidogrel 75 MG tablet ?Commonly known as: Plavix ?  ?glipiZIDE 5 MG 24 hr tablet ?Commonly known as: GLUCOTROL XL ?  ?Jardiance 10 MG Tabs tablet ?Generic drug: empagliflozin ?  ?vitamin B-12 1000 MCG tablet ?Commonly known as: CYANOCOBALAMIN ?  ? ?  ? ?TAKE these medications   ? ?amLODipine 10 MG tablet ?Commonly known as: NORVASC ?Take 1 tablet (10 mg total) by mouth daily. ?  ?cephALEXin 500 MG capsule ?Commonly known as: KEFLEX ?Take 1 capsule (500 mg total) by mouth 2 (two) times daily for 7 days. ?  ?donepezil 5 MG tablet ?Commonly known as: ARICEPT ?Take 5 mg by mouth daily. ?  ?Dupixent 300 MG/2ML prefilled syringe ?Generic drug: dupilumab ?Inject 300 mg into the skin every 14 (fourteen) days. Starting at day 15 for maintenance. ?  ?metFORMIN 1000 MG tablet ?Commonly known as: GLUCOPHAGE ?Take 1,000 mg by mouth 2 (two) times daily. ?  ?metoprolol succinate 100 MG 24 hr tablet ?Commonly known as: TOPROL-XL ?Take 1 tablet (100 mg total) by mouth daily. Take with or immediately following a meal. ?  ?Multi-Vitamins Tabs ?Take 1 tablet by mouth daily. ?  ?pravastatin 40 MG  tablet ?Commonly known as: PRAVACHOL ?Take 1 tablet (40 mg total) by mouth daily. ?  ?sertraline 25 MG tablet ?Commonly known as: ZOLOFT ?Take 25 mg by mouth at bedtime. ?  ?Tradjenta 5 MG Tabs tablet ?Generic drug: linagliptin ?Take 5 mg by mouth daily. ?  ?triamcinolone cream 0.1 % ?Commonly known as: KENALOG ?Apply 1 application topically 2 (two) times daily as needed. ?  ? ?  ? ? Follow-up Information   ? ? Aycock, Ngwe A, MD. Schedule an appointment as soon as possible for a visit in 1 week(s).   ?Specialty: Family Medicine ?Contact information: ?[address] ?[place] [address] ?[phone] ? ? ?  ?  ? ?  ?  ? ?  ? ?Discharge Exam: ?Filed Weights  ? 10/24/21 2024 10/25/21 0040  ?Weight: 60.2 kg 60.2 kg  ? ?General.  Well-developed elderly man, in no acute distress. ?Pulmonary.  Lungs clear bilaterally, normal respiratory effort. ?CV.  Regular rate and rhythm, no JVD, rub or murmur. ?Abdomen.  Soft, nontender, nondistended, BS positive. ?CNS.  Alert and oriented .  No focal neurologic deficit. ?Extremities.  No edema, no cyanosis, pulses intact and symmetrical. ?Psychiatry.  Judgment and insight appears normal.  ? ?Condition at discharge: stable ? ?The results of significant diagnostics from this hospitalization (including imaging, microbiology, ancillary and laboratory) are listed below for reference.  ? ?Imaging Studies: ?DG Chest 1 View ? ?Result Date: 10/24/2021 ?CLINICAL DATA:  Weakness and falls EXAM: CHEST  1 VIEW COMPARISON:  11/10/2020 FINDINGS: Left basilar atelectasis. Cardiomediastinal contours are normal. No pleural effusion or pneumothorax. IMPRESSION: Left basilar atelectasis. Electronically Signed   By: Ulyses Jarred M.D.   On: 10/24/2021 21:15  ? ?CT Head Wo Contrast ? ?Result Date: 10/25/2021 ?CLINICAL DATA:  6 hour repeat head CT for subdural hematoma. EXAM: CT HEAD WITHOUT CONTRAST TECHNIQUE: Contiguous axial images were obtained from the base of the skull through the vertex  without intravenous contrast. RADIATION DOSE REDUCTION: This exam was performed according to the departmental dose-optimization program which includes automated exposure control, adjustment of the mA and/or kV acc

## 2021-10-25 NOTE — ED Notes (Signed)
Pt eating at this time.

## 2021-10-25 NOTE — Progress Notes (Addendum)
SLP Cancellation Note ? ?Patient Details ?Name: Christopher [name] ?MRN: [id number] ?DOB: 14-Nov-1925 ? ? ?Cancelled treatment:       Reason Eval/Treat Not Completed: SLP screened, no needs identified, will sign off (chart reviewed, consulted NSG and met w/pt in room) ?Pt denied any difficulty swallowing and has passed the [facility] swallow screen w/ NSG, now awaiting his diet order from the MD. He was given Pills w/ Water ONE at a time; tolerated swallowing Pills w/ w/ this Clinician and NSG w/ no overt s/s of aspiration noted. Oral phase a little slow but adequate.  ?Pt conversed in basic conversation giving responses to direct questions w/out overt, gross expressive/receptive deficits noted; pt denied any speech-language deficits. Speech inteligible though muttered and gravely at his Baseline(per pt). He endorsed he used to work at [employer] in [employer]. He conversed w/ staff and answered general questions re: self and his family. He followed basic instructions w/ min support. He stated he was speaking ok. ? ?Suspect his language skills are baseline for him in setting of advanced age, in the hospital, and min HOH is appears.  ?No further skilled ST services indicated as pt appears at his baseline. Pt agreed. NSG to reconsult if any new change in status while admitted.   ? ?  ?  ? ? ?Orinda Kenner, MS, CCC-SLP ?Speech Language Pathologist ?[facility]; [place] ?[phone] (ascom) ?Eimi Viney ?10/25/2021, 11:25 AM ?

## 2021-10-25 NOTE — Care Management CC44 (Signed)
Condition Code 44 Documentation Completed ? ?Patient Details  ?Name: Christopher [name] ?MRN: [id number] ?Date of Birth: 1926-05-17 ? ? ?Condition Code 44 given:  Yes ?Patient signature on Condition Code 44 notice:  Yes ?Documentation of 2 MD's agreement:  Yes ?Code 44 added to claim:  Yes ? ? ? ?Margarito Liner, LCSW ?10/25/2021, 1:10 PM ? ?

## 2021-10-25 NOTE — Assessment & Plan Note (Signed)
-   We will continue his antihypertensives. 

## 2021-10-25 NOTE — Consult Note (Signed)
Neurosurgery-New Consultation Evaluation ?10/25/2021 ?Christopher [name] [id number] ? ?Identifying Statement: ?Christopher [name] is a 88 y.o. male from [place] [address] with a history of diabetes mellitus, hypertension, dyslipidemia, CVA/TIA ? ?Physician Requesting Consultation: No ref. provider found ? ?History of Present Illness: ?Christopher [name] is a 88 y.o with the above medical history presenting with acute generalized weakness, recent falls and fever. His son noted that he was confused after a fall resulting in hitting his head without LOS and brought him to the ER for further evaluation. He appears to be largely back at his baseline.  ? ?Past Medical History:  ?Past Medical History:  ?Diagnosis Date  ? Arthritis   ? Ataxia   ? Diabetes mellitus without complication ([place])   ? controlled, pt checks it in the morning   ? Dyslipidemia   ? Hypertension   ? somewhat controlled, pt checks every morning, he reports it has been good.  ? Stroke [facility])   ? TIA (transient ischemic attack)   ? ? ?Social History: ?Social History  ? ?Socioeconomic History  ? Marital status: Married  ?  Spouse name: Not on file  ? Number of children: Not on file  ? Years of education: Not on file  ? Highest education level: Not on file  ?Occupational History  ? Not on file  ?Tobacco Use  ? Smoking status: Former  ?  Types: Cigarettes  ? Smokeless tobacco: Never  ? Tobacco comments:  ?  quit 50-60 years ago  ?Vaping Use  ? Vaping Use: Never used  ?Substance and Sexual Activity  ? Alcohol use: No  ? Drug use: No  ? Sexual activity: Not on file  ?Other Topics Concern  ? Not on file  ?Social History Narrative  ? Not on file  ? ?Social Determinants of Health  ? ?Financial Resource Strain: Not on file  ?Food Insecurity: Not on file  ?Transportation Needs: Not on file  ?Physical Activity: Not on file  ?Stress: Not on file  ?Social Connections: Not on file  ?Intimate Partner Violence: Not on file  ? ? ?Family History: ?Family History  ?Problem Relation Age  of Onset  ? CAD Neg Hx   ? Hypertension Neg Hx   ? ? ?Review of Systems: ? ?Review of Systems - General ROS: Negative ?Psychological ROS: Negative ?Ophthalmic ROS: Negative ?ENT ROS: Negative ?Hematological and Lymphatic ROS: Negative  ?Endocrine ROS: Negative ?Respiratory ROS: Negative ?Cardiovascular ROS: Negative ?Gastrointestinal ROS: Negative ?Genito-Urinary ROS: Negative ?Musculoskeletal ROS: Negative ?Neurological ROS: Negative ?Dermatological ROS: Negative ? ?Physical Exam: ?BP (!) 100/55 (BP Location: Right Arm)   Pulse 64   Temp 98.6 ?F (37 ?C)   Resp (!) 21   Ht 5\' 8"  (1.727 m)   Wt 60.2 kg   SpO2 95%   BMI 20.18 kg/m?  Body mass index is 20.18 kg/m?Marland Kitchen Body surface area is 1.7 meters squared. ?General appearance: Alert, cooperative, in no acute distress ?Head: Normocephalic, atraumatic ?Eyes: Normal, EOM intact ?Oropharynx: Moist without lesions ?Neck: Supple, no tenderness ?Heart: Normal, regular rate and rhythm, without murmur ?Lungs: Clear to auscultation, good air exchange ?Abdomen: Soft, nondistended ?Ext: No edema in LE bilaterally, good distal pulses ? ?Neurologic exam:  ?Mental status: A&O x3 ?Speech: fluent and clear ?Cranial nerves:  ?CN II-XII grossly intact ?Motor:MAEW with good and equal strength ?Sensory: intact to light touch in all extremities ?Reflexes: 2+ and symmetric bilaterally for arms and legs ?Coordination: intact finger to nose. No pronator drift.  ?Gait: untested ? ?Laboratory: ?Results  for orders placed or performed during the hospital encounter of 10/24/21  ?Resp Panel by RT-PCR (Flu A&B, Covid) Nasopharyngeal Swab  ? Specimen: Nasopharyngeal Swab; Nasopharyngeal(NP) swabs in vial transport medium  ?Result Value Ref Range  ? SARS Coronavirus 2 by RT PCR NEGATIVE NEGATIVE  ? Influenza A by PCR NEGATIVE NEGATIVE  ? Influenza B by PCR NEGATIVE NEGATIVE  ?Blood Culture (routine x 2)  ? Specimen: BLOOD  ?Result Value Ref Range  ? Specimen Description BLOOD RIGHT ASSIST CONTROL    ? Special Requests    ?  BOTTLES DRAWN AEROBIC AND ANAEROBIC Blood Culture adequate volume  ? Culture    ?  NO GROWTH < 12 HOURS ?Performed at [facility], [address], [place], [place] [address] ?  ? Report Status PENDING   ?Blood Culture (routine x 2)  ? Specimen: BLOOD  ?Result Value Ref Range  ? Specimen Description BLOOD LEFT ASSIST CONTROL   ? Special Requests    ?  BOTTLES DRAWN AEROBIC AND ANAEROBIC Blood Culture adequate volume  ? Culture    ?  NO GROWTH < 12 HOURS ?Performed at [facility], [address], [place], [place] [address] ?  ? Report Status PENDING   ?Comprehensive metabolic panel  ?Result Value Ref Range  ? Sodium 133 (L) 135 - 145 mmol/L  ? Potassium 4.9 3.5 - 5.1 mmol/L  ? Chloride 96 (L) 98 - 111 mmol/L  ? CO2 27 22 - 32 mmol/L  ? Glucose, Bld 272 (H) 70 - 99 mg/dL  ? BUN 23 8 - 23 mg/dL  ? Creatinine, Ser 0.97 0.61 - 1.24 mg/dL  ? Calcium 9.5 8.9 - 10.3 mg/dL  ? Total Protein 8.3 (H) 6.5 - 8.1 g/dL  ? Albumin 3.8 3.5 - 5.0 g/dL  ? AST 24 15 - 41 U/L  ? ALT 16 0 - 44 U/L  ? Alkaline Phosphatase 83 38 - 126 U/L  ? Total Bilirubin 0.5 0.3 - 1.2 mg/dL  ? GFR, Estimated >60 >60 mL/min  ? Anion gap 10 5 - 15  ?CBC with Differential  ?Result Value Ref Range  ? WBC 13.1 (H) 4.0 - 10.5 K/uL  ? RBC 5.00 4.22 - 5.81 MIL/uL  ? Hemoglobin 14.0 13.0 - 17.0 g/dL  ? HCT 42.8 39.0 - 52.0 %  ? MCV 85.6 80.0 - 100.0 fL  ? MCH 28.0 26.0 - 34.0 pg  ? MCHC 32.7 30.0 - 36.0 g/dL  ? RDW 13.6 11.5 - 15.5 %  ? Platelets 206 150 - 400 K/uL  ? nRBC 0.0 0.0 - 0.2 %  ? Neutrophils Relative % 83 %  ? Neutro Abs 10.9 (H) 1.7 - 7.7 K/uL  ? Lymphocytes Relative 7 %  ? Lymphs Abs 0.9 0.7 - 4.0 K/uL  ? Monocytes Relative 9 %  ? Monocytes Absolute 1.2 (H) 0.1 - 1.0 K/uL  ? Eosinophils Relative 0 %  ? Eosinophils Absolute 0.0 0.0 - 0.5 K/uL  ? Basophils Relative 0 %  ? Basophils Absolute 0.0 0.0 - 0.1 K/uL  ? Immature Granulocytes 1 %  ? Abs Immature Granulocytes 0.08 (H) 0.00 - 0.07 K/uL  ?Lactic  acid, plasma  ?Result Value Ref Range  ? Lactic Acid, Venous 2.8 (HH) 0.5 - 1.9 mmol/L  ?Lactic acid, plasma  ?Result Value Ref Range  ? Lactic Acid, Venous 2.3 (HH) 0.5 - 1.9 mmol/L  ?Urinalysis, Complete w Microscopic  ?Result Value Ref Range  ? Color, Urine YELLOW (A) YELLOW  ?  APPearance CLEAR (A) CLEAR  ? Specific Gravity, Urine 1.016 1.005 - 1.030  ? pH 7.0 5.0 - 8.0  ? Glucose, UA >=500 (A) NEGATIVE mg/dL  ? Hgb urine dipstick LARGE (A) NEGATIVE  ? Bilirubin Urine NEGATIVE NEGATIVE  ? Ketones, ur NEGATIVE NEGATIVE mg/dL  ? Protein, ur NEGATIVE NEGATIVE mg/dL  ? Nitrite NEGATIVE NEGATIVE  ? Leukocytes,Ua NEGATIVE NEGATIVE  ? RBC / HPF >50 (H) 0 - 5 RBC/hpf  ? WBC, UA 6-10 0 - 5 WBC/hpf  ? Bacteria, UA RARE (A) NONE SEEN  ? Squamous Epithelial / LPF 0-5 0 - 5  ? Hyaline Casts, UA PRESENT   ?Procalcitonin - Baseline  ?Result Value Ref Range  ? Procalcitonin 0.13 ng/mL  ?Protime-INR  ?Result Value Ref Range  ? Prothrombin Time 13.8 11.4 - 15.2 seconds  ? INR 1.1 0.8 - 1.2  ?Procalcitonin  ?Result Value Ref Range  ? Procalcitonin 0.12 ng/mL  ?Basic metabolic panel  ?Result Value Ref Range  ? Sodium 135 135 - 145 mmol/L  ? Potassium 4.0 3.5 - 5.1 mmol/L  ? Chloride 101 98 - 111 mmol/L  ? CO2 26 22 - 32 mmol/L  ? Glucose, Bld 141 (H) 70 - 99 mg/dL  ? BUN 21 8 - 23 mg/dL  ? Creatinine, Ser 0.92 0.61 - 1.24 mg/dL  ? Calcium 8.5 (L) 8.9 - 10.3 mg/dL  ? GFR, Estimated >60 >60 mL/min  ? Anion gap 8 5 - 15  ?CBC  ?Result Value Ref Range  ? WBC 13.3 (H) 4.0 - 10.5 K/uL  ? RBC 4.39 4.22 - 5.81 MIL/uL  ? Hemoglobin 12.4 (L) 13.0 - 17.0 g/dL  ? HCT 37.7 (L) 39.0 - 52.0 %  ? MCV 85.9 80.0 - 100.0 fL  ? MCH 28.2 26.0 - 34.0 pg  ? MCHC 32.9 30.0 - 36.0 g/dL  ? RDW 13.6 11.5 - 15.5 %  ? Platelets 172 150 - 400 K/uL  ? nRBC 0.0 0.0 - 0.2 %  ? ?I personally reviewed labs ? ?Imaging: ? ?I personally reviewed radiology studies to include: ? ?CT head 10/24/21 ?IMPRESSION: ?1. Right cerebral convexity acute to early-subacute  subdural ?hematoma measuring up to 5 mm in depth. ?2. No significant mass effect is seen due to pre-existing atrophy. ?3. Stable findings of atrophy, small-vessel disease and carotid ?atherosclerosis. ?4. Osteopenia, kyph

## 2021-10-25 NOTE — Evaluation (Signed)
Physical Therapy Evaluation ?Patient Details ?Name: Christopher [name] ?MRN: [id number] ?DOB: [birth year] ?Today's Date: 10/25/2021 ? ?History of Present Illness ? Christopher [name] is a 88 y.o. [place] male with medical history significant for type diabetes mellitus, hypertension, dyslipidemia, CVA/TIA, who presented to the ER with acute onset of generalized weakness and recent falls as well as fever and chills. The patient admitted to urinary frequency and urgency without dysuria or hematuria or flank pain.  He is [place] and his son was offering translation. ?  ?Clinical Impression ? Pt admitted with above diagnosis. Pt received upright in bed. Son present with pt agreeable to PT services. Son  assisting in translating but is frankly hard to understand and communicate with for pt's baseline, home lay out, etc. Per son, at baseline pt requires RW to ambulate in home, able to perform stairs to enter home and perform flight of stairs to get to bedroom. Pt requires assist with bathing and dressing ADL's. Pt remains mod-I with bed mobility requiring min multimodal cues for sequencing to stand with safe hand placement requiring minguard. Pt able to ambulate ~160' with decreased step length bilat with subtle posterior lean in standing and walking requiring min VC's for upright posture and sequencing turns with RW with walking and with stand to sit. Pt returned to supine in bed mod-I with all needs in reach. Explained benefits of OP PT for pt to son due to improve upon deficits in gait, muscle weakness, and safe use of DME to reduce care giver burden and improve pt's independence in ADL completion. Pt currently with functional limitations due to the deficits listed below (see PT Problem List). Pt will benefit from skilled PT to increase their independence and safety with mobility to allow discharge to the venue listed below.     ? ?Recommendations for follow up therapy are one component of a multi-disciplinary discharge planning  process, led by the attending physician.  Recommendations may be updated based on patient status, additional functional criteria and insurance authorization. ? ?Follow Up Recommendations Outpatient PT ? ?  ?Assistance Recommended at Discharge Frequent or constant Supervision/Assistance  ?Patient can return home with the following ? A little help with walking and/or transfers;A little help with bathing/dressing/bathroom;Assist for transportation ? ?  ?Equipment Recommendations None recommended by PT  ?Recommendations for Other Services ?    ?  ?Functional Status Assessment Patient has had a recent decline in their functional status and demonstrates the ability to make significant improvements in function in a reasonable and predictable amount of time.  ? ?  ?Precautions / Restrictions Precautions ?Precautions: Fall ?Restrictions ?Weight Bearing Restrictions: No  ? ?  ? ?Mobility ? Bed Mobility ?Overal bed mobility: Modified Independent ?  ?  ?  ?  ?  ?  ?General bed mobility comments: Increased time, HOB elevated ?Patient Response: Cooperative ? ?Transfers ?Overall transfer level: Needs assistance ?Equipment used: Rolling walker (2 wheels) ?Transfers: Sit to/from Stand ?Sit to Stand: Min guard ?  ?  ?  ?  ?  ?General transfer comment: VC's for hand placement, use of momentum ?  ? ?Ambulation/Gait ?Ambulation/Gait assistance: Min guard ?Gait Distance (Feet): 160 Feet ?Assistive device: Rolling walker (2 wheels) ?Gait Pattern/deviations: Step-through pattern, Decreased step length - right, Decreased step length - left, Trunk flexed ?  ?  ?  ?General Gait Details: Pt slow and steady reciprocal gait with RW. Does require minAa t RW with turns but may be due to pt having diffculty understanding my commands as his  son interprets for Thereasa Parkin ? ?Stairs ?  ?  ?  ?  ?  ? ?Wheelchair Mobility ?  ? ?Modified Rankin (Stroke Patients Only) ?  ? ?  ? ?Balance Overall balance assessment: Needs assistance ?Sitting-balance support:  Bilateral upper extremity supported, Feet supported ?Sitting balance-Leahy Scale: Fair ?  ?  ?  ?Standing balance-Leahy Scale: Fair ?Standing balance comment: need for RW ?  ?  ?  ?  ?  ?  ?  ?  ?  ?  ?  ?   ? ? ? ?Pertinent Vitals/Pain Pain Assessment ?Pain Assessment: No/denies pain  ? ? ?Home Living Family/patient expects to be discharged to:: Private residence ?Living Arrangements: Children ?Available Help at Discharge: Family;Available 24 hours/day ?Type of Home: House ?Home Access: Stairs to enter ?Entrance Stairs-Rails: Right ?Entrance Stairs-Number of Steps: 4-5 ?Alternate Level Stairs-Number of Steps: flight ?Home Layout: Multi-level ?Home Equipment: Agricultural consultant (2 wheels);Shower seat ?   ?  ?Prior Function Prior Level of Function : Needs assist ?  ?  ?  ?Physical Assist : Mobility (physical) ?Mobility (physical): Transfers;Gait;Stairs ?  ?Mobility Comments: Son assists. uses RW at baseline ?ADLs Comments: Son assists  in bathing and dressing ?  ? ? ?Hand Dominance  ?   ? ?  ?Extremity/Trunk Assessment  ? Upper Extremity Assessment ?Upper Extremity Assessment: Defer to OT evaluation ?  ? ?Lower Extremity Assessment ?Lower Extremity Assessment: Generalized weakness ?  ? ?Cervical / Trunk Assessment ?Cervical / Trunk Assessment: Normal  ?Communication  ? Communication: Prefers language other than English  ?Cognition Arousal/Alertness: Awake/alert ?Behavior During Therapy: [facility] for tasks assessed/performed ?Overall Cognitive Status: Within Functional Limits for tasks assessed ?  ?  ?  ?  ?  ?  ?  ?  ?  ?  ?  ?  ?  ?  ?  ?  ?General Comments: follows commands, is mildly impulsive but may be due to language barrier. Able to re-direct easily. ?  ?  ? ?  ?General Comments   ? ?  ?Exercises Other Exercises ?Other Exercises: Role of PT in acute setting, d/c recs  ? ?Assessment/Plan  ?  ?PT Assessment Patient needs continued PT services  ?PT Problem List Decreased strength;Decreased knowledge of use of  DME;Decreased safety awareness;Decreased balance ? ?   ?  ?PT Treatment Interventions DME instruction;Balance training;Gait training;Neuromuscular re-education;Stair training;Functional mobility training;Patient/family education;Therapeutic activities;Therapeutic exercise   ? ?PT Goals (Current goals can be found in the Care Plan section)  ?Acute Rehab PT Goals ?PT Goal Formulation: Patient unable to participate in goal setting ? ?  ?Frequency Min 2X/week ?  ? ? ?Co-evaluation   ?  ?  ?  ?  ? ? ?  ?AM-PAC PT "6 Clicks" Mobility  ?Outcome Measure Help needed turning from your back to your side while in a flat bed without using bedrails?: A Little ?Help needed moving from lying on your back to sitting on the side of a flat bed without using bedrails?: A Little ?Help needed moving to and from a bed to a chair (including a wheelchair)?: A Little ?Help needed standing up from a chair using your arms (e.g., wheelchair or bedside chair)?: A Little ?Help needed to walk in hospital room?: A Little ?Help needed climbing 3-5 steps with a railing? : A Lot ?6 Click Score: 17 ? ?  ?End of Session Equipment Utilized During Treatment: Gait belt ?Activity Tolerance: Patient tolerated treatment well ?Patient left: in bed;with call bell/phone within reach;with bed  alarm set;with family/visitor present ?Nurse Communication: Mobility status ?PT Visit Diagnosis: Unsteadiness on feet (R26.81);Difficulty in walking, not elsewhere classified (R26.2);Muscle weakness (generalized) (M62.81) ?  ? ?Time: 3428-7681 ?PT Time Calculation (min) (ACUTE ONLY): 27 min ? ? ?Charges:   PT Evaluation ?$PT Eval Moderate Complexity: 1 Mod ?PT Treatments ?$Gait Training: 8-22 mins ?  ?   ? ?Delphia Grates. Fairly IV, PT, DPT ?Physical Therapist- [place]  ?[facility]  ?10/25/2021, 2:49 PM ? ?

## 2021-10-25 NOTE — Evaluation (Signed)
Occupational Therapy Evaluation ?Patient Details ?Name: Christopher [name] ?MRN: [id number] ?DOB: [birth year] ?Today's Date: 10/25/2021 ? ? ?History of Present Illness Christopher Muttonqbal Searing is a 88 y.o. [place] male with medical history significant for type diabetes mellitus, hypertension, dyslipidemia, CVA/TIA, who presented to the ER with acute onset of generalized weakness and recent falls as well as fever and chills. The patient admitted to urinary frequency and urgency without dysuria or hematuria or flank pain.  ? ?Clinical Impression ?  ?Chart reviewed, Rn cleared pt for participation in OT evaluation. Pt offered interpreting services, pt requests son to assist with translation as needed (pt speaks [place] but is not his first language). Pt son reports no change in ADL status from current baseline due to acute illness and admission. Pt BUE strength is grossly 4+/5 and he is performing ADL amb with RW with supervision. Pt participates in all ADL tasks with RW and supervision and has assist as needed from family members. Pt son reports concern with "noise" in mouth when swallowing. One episode of throat clearing noted following drinking water however pt and son did require vcs for appropriate feeding positioning with chin tuck. Team notified of son's concerns via secure chat. Pt does not appear to present with a significant functional decline in ADL status due to acute illness. No further OT recommended at this time. Please reconsult if there is a change in functional status.  ?   ? ?Recommendations for follow up therapy are one component of a multi-disciplinary discharge planning process, led by the attending physician.  Recommendations may be updated based on patient status, additional functional criteria and insurance authorization.  ? ?Follow Up Recommendations ? No OT follow up  ?  ?Assistance Recommended at Discharge Intermittent Supervision/Assistance  ?Patient can return home with the following   ? ?  ?Functional Status  Assessment ? Patient has not had a recent decline in their functional status  ?Equipment Recommendations ? None recommended by OT  ?  ?Recommendations for Other Services   ? ? ?  ?Precautions / Restrictions Precautions ?Precautions: Fall ?Restrictions ?Weight Bearing Restrictions: No  ? ?  ? ?Mobility Bed Mobility ?Overal bed mobility: Modified Independent ?  ?  ?  ?  ?  ?  ?  ?  ? ?Transfers ?Overall transfer level: Needs assistance ?Equipment used: Rolling walker (2 wheels) ?Transfers: Sit to/from Stand ?Sit to Stand: Supervision ?  ?  ?  ?  ?  ?  ?  ? ?  ?Balance Overall balance assessment: Needs assistance ?Sitting-balance support: Bilateral upper extremity supported, Feet supported ?Sitting balance-Leahy Scale: Fair ?  ?  ?Standing balance support: Reliant on assistive device for balance, During functional activity ?Standing balance-Leahy Scale: Fair ?  ?  ?  ?  ?  ?  ?  ?  ?  ?  ?  ?  ?   ? ?ADL either performed or assessed with clinical judgement  ? ?ADL Overall ADL's : Needs assistance/impaired ?  ?  ?  ?  ?  ?  ?  ?  ?  ?  ?  ?  ?  ?  ?  ?  ?  ?  ?  ?General ADL Comments: supine<>sit with MOD I wit HOB raised, amb to sink with supervision with RW, grooming tasks at sink level with supervision; Pt able to feed himself and bring water to his mouth. One throat clearing noted after swallowing water with no chin tuck noted. Step by step vcs for approrpaite feeding  positioning.  ? ? ? ?Vision Patient Visual Report: No change from baseline ?   ?   ?Perception   ?  ?Praxis   ?  ? ?Pertinent Vitals/Pain Pain Assessment ?Pain Assessment: No/denies pain  ? ? ? ?Hand Dominance   ?  ?Extremity/Trunk Assessment Upper Extremity Assessment ?Upper Extremity Assessment: RUE deficits/detail;LUE deficits/detail ?RUE Deficits / Details: RUE MMT grossly 4+/5 ?LUE Deficits / Details: RUE MMT grossly 4+/5 ?  ?Lower Extremity Assessment ?Lower Extremity Assessment: Defer to PT evaluation ?  ?Cervical / Trunk Assessment ?Cervical /  Trunk Assessment: Normal ?  ?Communication Communication ?Communication: Prefers language other than [place];Other (comment) (offered interpreting services, pt requested son to assist with translation) ?  ?Cognition Arousal/Alertness: Awake/alert ?Behavior During Therapy: [facility] for tasks assessed/performed ?Overall Cognitive Status: Within Functional Limits for tasks assessed ?  ?  ?  ?  ?  ?  ?  ?  ?  ?  ?  ?  ?  ?  ?  ?  ?General Comments: alert and oriented, follows commands approrpiate. Vcs for safe RW use. ?  ?  ?General Comments    ? ?  ?Exercises Other Exercises ?Other Exercises: edu re: role of OT, role of rehab, difference between rehab disciplines, discharge recommendations ?  ?Shoulder Instructions    ? ? ?Home Living Family/patient expects to be discharged to:: Private residence ?Living Arrangements: Children;Other (Comment) (multiple adult children) ?Available Help at Discharge: Family;Available 24 hours/day ?Type of Home: House ?Home Access: Stairs to enter ?Entrance Stairs-Number of Steps: 4-5 ?Entrance Stairs-Rails: Right ?Home Layout: Multi-level ?Alternate Level Stairs-Number of Steps: flight ?Alternate Level Stairs-Rails: Right ?Bathroom Shower/Tub: Tub/shower unit ?  ?Bathroom Toilet: Standard ?Bathroom Accessibility: Yes ?  ?Home Equipment: Agricultural consultant (2 wheels);Shower seat ?  ?  ?  ? ?  ?Prior Functioning/Environment Prior Level of Function : Needs assist ?  ?  ?  ?Physical Assist : Mobility (physical);ADLs (physical) ?Mobility (physical): Transfers;Gait;Stairs ?ADLs (physical): Bathing;Dressing;Toileting;IADLs ?Mobility Comments: RW at baseline ?ADLs Comments: pt is participates and is at times MOD I, however family assist as needed ?  ? ?  ?  ?OT Problem List:   ?  ?   ?OT Treatment/Interventions:    ?  ?OT Goals(Current goals can be found in the care plan section) Acute Rehab OT Goals ?Patient Stated Goal: go home ?OT Goal Formulation: With patient/family ?Time For Goal Achievement:  11/08/21 ?Potential to Achieve Goals: Good  ?OT Frequency:   ?  ? ?Co-evaluation   ?  ?  ?  ?  ? ?  ?AM-PAC OT "6 Clicks" Daily Activity     ?Outcome Measure Help from another person eating meals?: A Little ?Help from another person taking care of personal grooming?: A Little ?Help from another person toileting, which includes using toliet, bedpan, or urinal?: A Little ?Help from another person bathing (including washing, rinsing, drying)?: A Little ?Help from another person to put on and taking off regular upper body clothing?: A Little ?Help from another person to put on and taking off regular lower body clothing?: A Little ?6 Click Score: 18 ?  ?End of Session Equipment Utilized During Treatment: Rolling walker (2 wheels) ?Nurse Communication: Mobility status ? ?Activity Tolerance: Patient tolerated treatment well ?Patient left: in bed;with call bell/phone within reach ? ?   ?              ?Time: 0814-4818 ?OT Time Calculation (min): 10 min ?Charges:  OT General Charges ?$OT Visit: 1 Visit ?OT  Evaluation ?$OT Eval Low Complexity: 1 Low ? ?Oleta Mouse, OTD OTR/L  ?10/25/21, 3:37 PM  ?

## 2021-10-25 NOTE — ED Notes (Signed)
Admit MD at bedside speaking with family ?

## 2021-10-25 NOTE — H&P (Signed)
?  ?  ?[place] ? ? ?PATIENT NAME: Christopher [name]   ? ?MR#:  [id number] ? ?DATE OF BIRTH:  02-02-1926 ? ?DATE OF ADMISSION:  10/24/2021 ? ?PRIMARY CARE PHYSICIAN: Emogene MorganAycock, Ngwe A, MD  ? ?Patient is coming from: Home ? ?REQUESTING/REFERRING PHYSICIAN: Alfonse FlavorsStafford, Philip, MD ? ?CHIEF COMPLAINT:  ? ?Chief Complaint  ?Patient presents with  ? Weakness  ? ? ?HISTORY OF PRESENT ILLNESS:  ?Christopher [name] is a 88 y.o. [place] male with medical history significant for type diabetes mellitus, hypertension, dyslipidemia, CVA/TIA, who presented to the ER with acute onset of generalized weakness and recent falls as well as fever and chills.  The patient admitted to urinary frequency and urgency without dysuria or hematuria or flank pain.  He is [place] and his son was offering translation.  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  He denied any chest pain or palpitations or cough or wheezing or dyspnea.  He admitted to hitting his head during one of his falls without loss of consciousness.  No other injuries.  His son noticed that he was slightly confused after his fall.  In the ER he was alert and oriented x3 and cooperative. ? ?ED Course: When he came to the ER temperature was one 1.9 with heart rate of 101 and respirate of 20 and later 26.  Labs revealed mild hyponatremia hypochloremia and hyperglycemia of 272.  Lactic acid was 2.83 and CBC showed leukocytosis 13.1 with neutrophilia.  Influenza antigens and COVID-19 PCR came back negative.UA showed 6-10 WBCs with more than 50 RBCs and rare bacteria and more than 500 glucose.  Urine culture was sent ?EKG as reviewed by me : EKG showed sinus rhythm with premature supraventricular complexes, right superior axis deviation and poor R wave progression. ?Imaging: Noncontrast head CT scan revealed the following: ?1. Right cerebral convexity acute to early-subacute subdural ?hematoma measuring up to 5 mm in depth. ?2. No significant mass effect is seen due to pre-existing  atrophy. ?3. Stable findings of atrophy, small-vessel disease and carotid ?atherosclerosis. ?4. Osteopenia, kyphodextroscoliosis and degenerative change of the ?cervical spine, without evidence of displaced fractures, with ?multilevel vertebral body ankylosis. ?5. Sinus disease. ? ?Contact was made with Dr. Marcell BarlowYarborough with neurosurgery who recommended repeating head CT scan around 3:30 AM and that came back with subdural hematoma that remains no more than 5 mm in thickness without significant underlying mass effect due to pre-existing atrophy.  There was no new acute abnormalities.  It showed left parieto-occipital scalp contusion with no depressed skull fractures.  It showed osteopenia.  The patient will be admitted to a stepdown unit bed for further evaluation and management. ? ?PAST MEDICAL HISTORY:  ? ?Past Medical History:  ?Diagnosis Date  ? Arthritis   ? Ataxia   ? Diabetes mellitus without complication (HCC)   ? controlled, pt checks it in the morning   ? Dyslipidemia   ? Hypertension   ? somewhat controlled, pt checks every morning, he reports it has been good.  ? Stroke [facility](HCC)   ? TIA (transient ischemic attack)   ? ? ?PAST SURGICAL HISTORY:  ? ?Past Surgical History:  ?Procedure Laterality Date  ? none    ? ? ?SOCIAL HISTORY:  ? ?Social History  ? ?Tobacco Use  ? Smoking status: Former  ?  Types: Cigarettes  ? Smokeless tobacco: Never  ? Tobacco comments:  ?  quit 50-60 years ago  ?Substance Use Topics  ? Alcohol use: No  ? ? ?  FAMILY HISTORY:  ? ?Family History  ?Problem Relation Age of Onset  ? CAD Neg Hx   ? Hypertension Neg Hx   ? ? ?DRUG ALLERGIES:  ? ?Allergies  ?Allergen Reactions  ? Hydrochlorothiazide   ?  Unknown reaction  ? Penicillins Other (See Comments)  ?  Reaction unknown.   ? ? ?REVIEW OF SYSTEMS:  ? ?ROS ?As per history of present illness. All pertinent systems were reviewed above. Constitutional, HEENT, cardiovascular, respiratory, GI, GU, musculoskeletal, neuro, psychiatric, endocrine,  integumentary and hematologic systems were reviewed and are otherwise negative/unremarkable except for positive findings mentioned above in the HPI. ? ? ?MEDICATIONS AT HOME:  ? ?Prior to Admission medications   ?Medication Sig Start Date End Date Taking? Authorizing Provider  ?amLODipine (NORVASC) 10 MG tablet Take 1 tablet (10 mg total) by mouth daily. 01/01/17  Yes Adrian Saran, MD  ?clopidogrel (PLAVIX) 75 MG tablet Take 1 tablet (75 mg total) by mouth daily. 07/05/15  Yes Katha Hamming, MD  ?donepezil (ARICEPT) 5 MG tablet Take 5 mg by mouth daily. 08/13/16  Yes [provider]  ?dupilumab (DUPIXENT) 300 MG/2ML prefilled syringe Inject 300 mg into the skin every 14 (fourteen) days. Starting at day 15 for maintenance. 05/06/21  Yes Deirdre Evener, MD  ?JARDIANCE 10 MG TABS tablet Take 10 mg by mouth daily. 09/04/21  Yes [provider]  ?metFORMIN (GLUCOPHAGE) 1000 MG tablet Take 1,000 mg by mouth 2 (two) times daily. 10/06/21  Yes [provider]  ?metoprolol succinate (TOPROL-XL) 100 MG 24 hr tablet Take 1 tablet (100 mg total) by mouth daily. Take with or immediately following a meal. 01/01/17  Yes Mody, Sital, MD  ?Multiple Vitamin (MULTI-VITAMINS) TABS Take 1 tablet by mouth daily.   Yes [provider]  ?pravastatin (PRAVACHOL) 40 MG tablet Take 1 tablet (40 mg total) by mouth daily. 07/05/15  Yes Katha Hamming, MD  ?sertraline (ZOLOFT) 25 MG tablet Take 25 mg by mouth at bedtime. 10/06/21  Yes [provider]  ?TRADJENTA 5 MG TABS tablet Take 5 mg by mouth daily. 10/06/21  Yes [provider]  ?triamcinolone cream (KENALOG) 0.1 % Apply 1 application topically 2 (two) times daily as needed. 11/05/20  Yes Deirdre Evener, MD  ?aspirin (ASPIRIN CHILDRENS) 81 MG chewable tablet Chew 1 tablet (81 mg total) by mouth daily. ?Patient not taking: Reported on 10/24/2021 01/01/17   Adrian Saran, MD  ?glipiZIDE (GLUCOTROL XL) 5 MG 24 hr tablet Take 1 tablet by  mouth daily. ?Patient not taking: Reported on 10/24/2021 06/21/15   [provider]  ?vitamin B-12 (CYANOCOBALAMIN) 1000 MCG tablet Take 1,000 mcg by mouth daily. ?Patient not taking: Reported on 10/24/2021    [provider]  ? ?  ? ?VITAL SIGNS:  ?Blood pressure (!) 92/50, pulse 67, temperature 98.7 ?F (37.1 ?C), temperature source Oral, resp. rate 18, height 5\' 8"  (1.727 m), weight 60.2 kg, SpO2 96 %. ? ?PHYSICAL EXAMINATION:  ?Physical Exam ? ?GENERAL:  88 y.o.-year-old 85 male patient lying in the bed with no acute distress.  ?EYES: Pupils equal, round, reactive to light and accommodation. No scleral icterus. Extraocular muscles intact.  ?HEENT: Head atraumatic, normocephalic. Oropharynx and nasopharynx clear.  ?NECK:  Supple, no jugular venous distention. No thyroid enlargement, no tenderness.  ?LUNGS: Normal breath sounds bilaterally, no wheezing, rales,rhonchi or crepitation. No use of accessory muscles of respiration.  ?CARDIOVASCULAR: Regular rate and rhythm, S1, S2 normal. No murmurs, rubs, or gallops.  ?ABDOMEN: Soft,  nondistended, nontender. Bowel sounds present. No organomegaly or mass.  ?EXTREMITIES: No pedal edema, cyanosis, or clubbing.  ?NEUROLOGIC: Cranial nerves II through XII are intact. Muscle strength 5/5 in all extremities. Sensation intact. Gait not checked.  ?PSYCHIATRIC: The patient is alert and oriented x 3.  Normal affect and good eye contact. ?SKIN: No obvious rash, lesion, or ulcer.  ? ?LABORATORY PANEL:  ? ?CBC ?Recent Labs  ?Lab 10/25/21 ?5329  ?WBC 13.3*  ?HGB 12.4*  ?HCT 37.7*  ?PLT 172  ? ?------------------------------------------------------------------------------------------------------------------ ? ?Chemistries  ?Recent Labs  ?Lab 10/24/21 ?2029 10/25/21 ?9242  ?NA 133* 135  ?K 4.9 4.0  ?CL 96* 101  ?CO2 27 26  ?GLUCOSE 272* 141*  ?BUN 23 21  ?CREATININE 0.97 0.92  ?CALCIUM 9.5 8.5*  ?AST 24  --   ?ALT 16  --   ?ALKPHOS 83  --   ?BILITOT 0.5  --    ? ?------------------------------------------------------------------------------------------------------------------ ? ?Cardiac Enzymes ?No results for input(s): TROPONINI in the last 168 hours. ?-----------------

## 2021-10-25 NOTE — Assessment & Plan Note (Signed)
-   We will place the patient on supplement coverage with NovoLog. ?- We will continue Jardiance and Glucotrol XL while holding off metformin. ?

## 2021-10-25 NOTE — Assessment & Plan Note (Signed)
-   We will continue Zoloft 

## 2021-10-25 NOTE — Discharge Summary (Incomplete Revision)
?Physician Discharge Summary ?  ?Patient: Christopher [name] MRN: [id number] DOB: April 29, 1926  ?Admit date:     10/24/2021  ?Discharge date: 10/25/21  ?Discharge Physician: Arnetha CourserSumayya Gamaliel Charney  ? ?PCP: Emogene MorganAycock, Ngwe A, MD  ? ?Recommendations at discharge:  ?Please follow-up with primary care provider within a week. ?Please repeat UA in 1 to 2 weeks to ensure resolution of hematuria. ?Patient will need referral to urology if microscopic hematuria persists ?His Jardiance was discontinued for concern of UTI, please evaluate the need for adding another agent for better control of diabetes. ?Patient also had a small subdural hematoma which need to be managed conservatively. ? ?Discharge Diagnoses: ?Principal Problem: ?  Sepsis due to gram-negative UTI (HCC) ?Active Problems: ?  Subdural hematoma (HCC) ?  Type 2 diabetes mellitus without complications (HCC) ?  Essential hypertension ?  Dyslipidemia ?  Depression ?  GERD without esophagitis ?  Sepsis (HCC) ? ? ?Hospital Course: ?No notes on file ? ?Assessment and Plan: ?* Sepsis due to gram-negative UTI (HCC) ?- The patient will be admitted to the stepdown unit bed. ?- Sepsis is manifested by leukocytosis, fever, tachycardia and tachypnea. ?- His criteria for severe sepsis given lactic acid of 2.8. ?- Continue hydration with IV normal saline and antibiotic therapy with IV Rocephin. ?- We will follow blood and urine cultures. ?- This could be the culprit for his generalized weakness and recurrent falls. ? ?Subdural hematoma (HCC) ?- The patient is admitted to stepdown unit bed. ?- Neurochecks will be followed. ?- Repeat head CT scan was performed and is resulted above. ?- Neurosurgery consult will be obtained. ?- Dr. Myer HaffYarbrough was notified about the patient. ?- All blood thinners will be held off. ? ?Type 2 diabetes mellitus without complications (HCC) ?- We will place the patient on supplement coverage with NovoLog. ?- We will continue Jardiance and Glucotrol XL while holding off  metformin. ? ?GERD without esophagitis ?- We will continue PPI therapy. ? ?Depression ?- We will continue Zoloft. ? ?Dyslipidemia ?- We will continue statin therapy. ? ?Essential hypertension ?- We will continue his antihypertensives. ? ? ?Consultants: Neurosurgery ?Procedures performed: None ?Disposition: Home ?Diet recommendation:  ?Discharge Diet Orders (From admission, onward)  ? ?  Start     Ordered  ? 10/25/21 0000  Diet - low sodium heart healthy       ? 10/25/21 1303  ? ?  ?  ? ?  ? ?Cardiac and Carb modified diet ?DISCHARGE MEDICATION: ?Allergies as of 10/25/2021   ? ?   Reactions  ? Hydrochlorothiazide   ? Unknown reaction  ? Beef-derived Products Other (See Comments)  ? religion  ? Penicillins Other (See Comments)  ? Reaction unknown.   ? Pork-derived Products Other (See Comments)  ? religion  ? ?  ? ?  ?Medication List  ?  ? ?STOP taking these medications   ? ?aspirin 81 MG chewable tablet ?Commonly known as: Aspirin Childrens ?  ?clopidogrel 75 MG tablet ?Commonly known as: Plavix ?  ?glipiZIDE 5 MG 24 hr tablet ?Commonly known as: GLUCOTROL XL ?  ?Jardiance 10 MG Tabs tablet ?Generic drug: empagliflozin ?  ?vitamin B-12 1000 MCG tablet ?Commonly known as: CYANOCOBALAMIN ?  ? ?  ? ?TAKE these medications   ? ?amLODipine 10 MG tablet ?Commonly known as: NORVASC ?Take 1 tablet (10 mg total) by mouth daily. ?  ?cephALEXin 500 MG capsule ?Commonly known as: KEFLEX ?Take 1 capsule (500 mg total) by mouth 2 (two) times daily for 7  days. ?  ?donepezil 5 MG tablet ?Commonly known as: ARICEPT ?Take 5 mg by mouth daily. ?  ?Dupixent 300 MG/2ML prefilled syringe ?Generic drug: dupilumab ?Inject 300 mg into the skin every 14 (fourteen) days. Starting at day 15 for maintenance. ?  ?metFORMIN 1000 MG tablet ?Commonly known as: GLUCOPHAGE ?Take 1,000 mg by mouth 2 (two) times daily. ?  ?metoprolol succinate 100 MG 24 hr tablet ?Commonly known as: TOPROL-XL ?Take 1 tablet (100 mg total) by mouth daily. Take with or  immediately following a meal. ?  ?Multi-Vitamins Tabs ?Take 1 tablet by mouth daily. ?  ?pravastatin 40 MG tablet ?Commonly known as: PRAVACHOL ?Take 1 tablet (40 mg total) by mouth daily. ?  ?sertraline 25 MG tablet ?Commonly known as: ZOLOFT ?Take 25 mg by mouth at bedtime. ?  ?Tradjenta 5 MG Tabs tablet ?Generic drug: linagliptin ?Take 5 mg by mouth daily. ?  ?triamcinolone cream 0.1 % ?Commonly known as: KENALOG ?Apply 1 application topically 2 (two) times daily as needed. ?  ? ?  ? ? Follow-up Information   ? ? Aycock, Ngwe A, MD. Schedule an appointment as soon as possible for a visit in 1 week(s).   ?Specialty: Family Medicine ?Contact information: ?[address] ?[place] [address] ?[phone] ? ? ?  ?  ? ?  ?  ? ?  ? ?Discharge Exam: ?Filed Weights  ? 10/24/21 2024 10/25/21 0040  ?Weight: 60.2 kg 60.2 kg  ? ?General.  Well-developed elderly man, in no acute distress. ?Pulmonary.  Lungs clear bilaterally, normal respiratory effort. ?CV.  Regular rate and rhythm, no JVD, rub or murmur. ?Abdomen.  Soft, nontender, nondistended, BS positive. ?CNS.  Alert and oriented .  No focal neurologic deficit. ?Extremities.  No edema, no cyanosis, pulses intact and symmetrical. ?Psychiatry.  Judgment and insight appears normal.  ? ?Condition at discharge: stable ? ?The results of significant diagnostics from this hospitalization (including imaging, microbiology, ancillary and laboratory) are listed below for reference.  ? ?Imaging Studies: ?DG Chest 1 View ? ?Result Date: 10/24/2021 ?CLINICAL DATA:  Weakness and falls EXAM: CHEST  1 VIEW COMPARISON:  11/10/2020 FINDINGS: Left basilar atelectasis. Cardiomediastinal contours are normal. No pleural effusion or pneumothorax. IMPRESSION: Left basilar atelectasis. Electronically Signed   By: Deatra Robinson M.D.   On: 10/24/2021 21:15  ? ?CT Head Wo Contrast ? ?Result Date: 10/25/2021 ?CLINICAL DATA:  6 hour repeat head CT for subdural hematoma. EXAM: CT HEAD WITHOUT  CONTRAST TECHNIQUE: Contiguous axial images were obtained from the base of the skull through the vertex without intravenous contrast. RADIATION DOSE REDUCTION: This exam was performed according to the departmental dose-optimization program which includes automated exposure control, adjustment of the mA and/or kV according to patient size and/or use of iterative reconstruction technique. COMPARISON:  Head CT yesterday at 9:20 p.m. FINDINGS: CURRENT STUDY IMAGES COMPLETED AT 3:40 A.M., 10/25/2021: FINDINGS: CURRENT STUDY IMAGES COMPLETED AT 3:40 A.M., 10/25/2021 Brain: Moderately dense right cerebral convexity subdural hematoma continues to measure no greater than 5 mm, with the greatest thickness along the outer right temporal convexity. No new hemorrhage has become apparent, and there remains no significant underlying mass effect and no midline shift. Stable chronic changes described previously, including moderately advanced atrophy, small vessel disease and atrophic ventriculomegaly and mild cerebellar atrophy. No cortical based acute infarct is seen, no parenchymal hemorrhage or mass effect. There is no midline shift. Vascular: There are calcifications of the carotid siphons. No hyperdense central vessel. Scattered calcification distal  vertebral arteries. Skull: Left parieto-occipital scalp contusion. No depressed skull fracture. Osteopenia. Sinuses/Orbits: Patchy moderate membrane disease ethmoid sinus, near circumferential moderate to severe membrane disease in the sphenoid sinus. No mastoid effusion. Other: Old lens replacements. IMPRESSION: 1. Subdural hematoma remains no more than 5 mm in thickness, without significant underlying mass effect due to pre-existing atrophy. 2. No new hemorrhage has become apparent.  Chronic changes. 3. Left parieto-occipital scalp contusion but no depressed skull fractures. Osteopenia. Electronically Signed   By: Almira Bar M.D.   On: 10/25/2021 04:10  ? ?CT Head Wo  Contrast ? ?Result Date: 10/24/2021 ?CLINICAL DATA:  Frequent falls, head and neck trauma. History of strokes. EXAM: CT HEAD WITHOUT CONTRAST CT CERVICAL SPINE WITHOUT CONTRAST TECHNIQUE: Multidetector CT imaging of the head

## 2021-10-25 NOTE — Care Management Obs Status (Signed)
MEDICARE OBSERVATION STATUS NOTIFICATION ? ? ?Patient Details  ?Name: Christopher [name] ?MRN: [id number] ?Date of Birth: March 14, 1926 ? ? ?Medicare Observation Status Notification Given:  Yes ? ? ? ?Margarito Liner, LCSW ?10/25/2021, 1:10 PM ?

## 2021-10-25 NOTE — Progress Notes (Signed)
Inpatient Diabetes Program Recommendations ? ?AACE/ADA: New Consensus Statement on Inpatient Glycemic Control (2015) ? ?Target Ranges:  Prepandial:   less than 140 mg/dL ?     Peak postprandial:   less than 180 mg/dL (1-2 hours) ?     Critically ill patients:  140 - 180 mg/dL  ? ?Lab Results  ?Component Value Date  ? GLUCAP 175 (H) 04/16/2018  ? HGBA1C 8.7 (H) 10/16/2016  ? ? ?Review of Glycemic Control ? Latest Reference Range & Units 10/24/21 20:29 10/25/21 05:17  ?Glucose 70 - 99 mg/dL 272 (H) 141 (H)  ? ?Diabetes history: DM 2 ?Outpatient Diabetes medications:  ?Glipzide XL 5 mg q 24 hours ?Jardiance 10 mg daily ?Metformin 1000 mg bid ?Tradjenta 5 mg daily ?Current orders for Inpatient glycemic control:  ?Jardiance 10 mg daily ?Tradjenta 5 mg daily ? ?Inpatient Diabetes Program Recommendations:   ? ?Note admit for UTI.  May consider d/c of Jardiance due to increased risk of UTI with this class of medications.  Consider adding Novolog sensitive tid with meals.  ? ?Thanks,  ?Adah Perl, RN, BC-ADM ?Inpatient Diabetes Coordinator ?Pager [phone]  (8a-5p) ? ? ? ?

## 2021-10-25 NOTE — Assessment & Plan Note (Signed)
-   The patient is admitted to stepdown unit bed. ?- Neurochecks will be followed. ?- Repeat head CT scan was performed and is resulted above. ?- Neurosurgery consult will be obtained. ?- Dr. Myer Haff was notified about the patient. ?- All blood thinners will be held off. ?

## 2021-10-25 NOTE — Assessment & Plan Note (Deleted)
-   We will place on supplement coverage with NovoLog and continue his Jardiance Glucotrol XL while holding off metformin. ?

## 2021-10-25 NOTE — Assessment & Plan Note (Signed)
-   We will continue statin therapy. 

## 2021-10-25 NOTE — Assessment & Plan Note (Addendum)
-   The patient will be admitted to the stepdown unit bed. ?- Sepsis is manifested by leukocytosis, fever, tachycardia and tachypnea. ?- His criteria for severe sepsis given lactic acid of 2.8. ?- Continue hydration with IV normal saline and antibiotic therapy with IV Rocephin. ?- We will follow blood and urine cultures. ?- This could be the culprit for his generalized weakness and recurrent falls. ?

## 2021-10-25 NOTE — TOC Transition Note (Signed)
Transition of Care (TOC) - CM/SW Discharge Note ? ? ?Patient Details  ?Name: Jed Kutch ?MRN: [id number] ?Date of Birth: 01/01/1926 ? ?Transition of Care (TOC) CM/SW Contact:  ?Candie Chroman, LCSW ?Phone Number: ?10/25/2021, 1:41 PM ? ? ?Clinical Narrative:   Patient has orders to discharge home today. Per MD, son interested in outpatient PT. Met with patient and son. Son prefers [facility] because he has been there in the past. Faxed referral form. No further concerns. CSW signing off. ? ?Final next level of care: OP Rehab ?Barriers to Discharge: No Barriers Identified ? ? ?Patient Goals and CMS Choice ?  ?  ?  ? ?Discharge Placement ?  ?           ?  ?Patient to be transferred to facility by: Son ?Name of family member notified: Daxx Tiggs ?Patient and family notified of of transfer: 10/25/21 ? ?Discharge Plan and Services ?  ?  ?           ?  ?  ?  ?  ?  ?  ?  ?  ?  ?  ? ?Social Determinants of Health (SDOH) Interventions ?  ? ? ?Readmission Risk Interventions ?   ? View : No data to display.  ?  ?  ?  ? ? ? ? ? ?

## 2021-10-26 LAB — URINE CULTURE: Culture: NO GROWTH

## 2021-10-26 LAB — CORTISOL-AM, BLOOD: Cortisol - AM: 30.2 ug/dL — ABNORMAL HIGH (ref 6.7–22.6)

## 2021-10-29 LAB — CULTURE, BLOOD (ROUTINE X 2)
Culture: NO GROWTH
Culture: NO GROWTH
Special Requests: ADEQUATE
Special Requests: ADEQUATE

## 2021-11-01 ENCOUNTER — Inpatient Hospital Stay: Payer: Medicare HMO

## 2021-11-01 ENCOUNTER — Inpatient Hospital Stay
Admission: EM | Admit: 2021-11-01 | Discharge: 2021-11-14 | DRG: 003 | Disposition: A | Discharge status: Other Acute Inpatient Hospital | Payer: Medicare HMO | Attending: Internal Medicine | Admitting: Internal Medicine

## 2021-11-01 ENCOUNTER — Emergency Department: Payer: Medicare HMO

## 2021-11-01 ENCOUNTER — Other Ambulatory Visit: Payer: Self-pay

## 2021-11-01 DIAGNOSIS — E1111 Type 2 diabetes mellitus with ketoacidosis with coma: Secondary | ICD-10-CM | POA: Diagnosis present

## 2021-11-01 DIAGNOSIS — J9621 Acute and chronic respiratory failure with hypoxia: Secondary | ICD-10-CM | POA: Diagnosis not present

## 2021-11-01 DIAGNOSIS — Z515 Encounter for palliative care: Secondary | ICD-10-CM | POA: Diagnosis not present

## 2021-11-01 DIAGNOSIS — E785 Hyperlipidemia, unspecified: Secondary | ICD-10-CM | POA: Diagnosis present

## 2021-11-01 DIAGNOSIS — Z7902 Long term (current) use of antithrombotics/antiplatelets: Secondary | ICD-10-CM

## 2021-11-01 DIAGNOSIS — S065X1A Traumatic subdural hemorrhage with loss of consciousness of 30 minutes or less, initial encounter: Principal | ICD-10-CM | POA: Diagnosis present

## 2021-11-01 DIAGNOSIS — Z91018 Allergy to other foods: Secondary | ICD-10-CM

## 2021-11-01 DIAGNOSIS — G9341 Metabolic encephalopathy: Secondary | ICD-10-CM | POA: Diagnosis present

## 2021-11-01 DIAGNOSIS — F0150 Vascular dementia without behavioral disturbance: Secondary | ICD-10-CM | POA: Diagnosis present

## 2021-11-01 DIAGNOSIS — I6200 Nontraumatic subdural hemorrhage, unspecified: Secondary | ICD-10-CM | POA: Diagnosis present

## 2021-11-01 DIAGNOSIS — R4182 Altered mental status, unspecified: Secondary | ICD-10-CM | POA: Diagnosis not present

## 2021-11-01 DIAGNOSIS — G8194 Hemiplegia, unspecified affecting left nondominant side: Secondary | ICD-10-CM | POA: Diagnosis present

## 2021-11-01 DIAGNOSIS — Z7189 Other specified counseling: Secondary | ICD-10-CM | POA: Diagnosis not present

## 2021-11-01 DIAGNOSIS — Z930 Tracheostomy status: Secondary | ICD-10-CM | POA: Diagnosis not present

## 2021-11-01 DIAGNOSIS — G939 Disorder of brain, unspecified: Secondary | ICD-10-CM | POA: Diagnosis present

## 2021-11-01 DIAGNOSIS — Z8673 Personal history of transient ischemic attack (TIA), and cerebral infarction without residual deficits: Secondary | ICD-10-CM

## 2021-11-01 DIAGNOSIS — J9601 Acute respiratory failure with hypoxia: Secondary | ICD-10-CM | POA: Diagnosis present

## 2021-11-01 DIAGNOSIS — S065XAA Traumatic subdural hemorrhage with loss of consciousness status unknown, initial encounter: Principal | ICD-10-CM

## 2021-11-01 DIAGNOSIS — I10 Essential (primary) hypertension: Secondary | ICD-10-CM | POA: Diagnosis present

## 2021-11-01 DIAGNOSIS — Z885 Allergy status to narcotic agent status: Secondary | ICD-10-CM | POA: Diagnosis not present

## 2021-11-01 DIAGNOSIS — G935 Compression of brain: Secondary | ICD-10-CM | POA: Diagnosis present

## 2021-11-01 DIAGNOSIS — Y92012 Bathroom of single-family (private) house as the place of occurrence of the external cause: Secondary | ICD-10-CM

## 2021-11-01 DIAGNOSIS — J690 Pneumonitis due to inhalation of food and vomit: Secondary | ICD-10-CM | POA: Diagnosis not present

## 2021-11-01 DIAGNOSIS — E1311 Other specified diabetes mellitus with ketoacidosis with coma: Secondary | ICD-10-CM

## 2021-11-01 DIAGNOSIS — R402112 Coma scale, eyes open, never, at arrival to emergency department: Secondary | ICD-10-CM | POA: Diagnosis present

## 2021-11-01 DIAGNOSIS — E871 Hypo-osmolality and hyponatremia: Secondary | ICD-10-CM | POA: Diagnosis present

## 2021-11-01 DIAGNOSIS — Z7984 Long term (current) use of oral hypoglycemic drugs: Secondary | ICD-10-CM

## 2021-11-01 DIAGNOSIS — Z880 Allergy status to penicillin: Secondary | ICD-10-CM

## 2021-11-01 DIAGNOSIS — Z20822 Contact with and (suspected) exposure to covid-19: Secondary | ICD-10-CM | POA: Diagnosis present

## 2021-11-01 DIAGNOSIS — Z79899 Other long term (current) drug therapy: Secondary | ICD-10-CM

## 2021-11-01 DIAGNOSIS — R402212 Coma scale, best verbal response, none, at arrival to emergency department: Secondary | ICD-10-CM | POA: Diagnosis present

## 2021-11-01 DIAGNOSIS — S065X0A Traumatic subdural hemorrhage without loss of consciousness, initial encounter: Secondary | ICD-10-CM | POA: Diagnosis not present

## 2021-11-01 DIAGNOSIS — Z66 Do not resuscitate: Secondary | ICD-10-CM | POA: Diagnosis present

## 2021-11-01 DIAGNOSIS — W1811XA Fall from or off toilet without subsequent striking against object, initial encounter: Secondary | ICD-10-CM | POA: Diagnosis present

## 2021-11-01 DIAGNOSIS — J9602 Acute respiratory failure with hypercapnia: Secondary | ICD-10-CM | POA: Diagnosis present

## 2021-11-01 DIAGNOSIS — R402312 Coma scale, best motor response, none, at arrival to emergency department: Secondary | ICD-10-CM | POA: Diagnosis present

## 2021-11-01 LAB — URINE DRUG SCREEN, QUALITATIVE (ARMC ONLY)
Amphetamines, Ur Screen: NOT DETECTED
Barbiturates, Ur Screen: NOT DETECTED
Benzodiazepine, Ur Scrn: NOT DETECTED
Cannabinoid 50 Ng, Ur ~~LOC~~: NOT DETECTED
Cocaine Metabolite,Ur ~~LOC~~: NOT DETECTED
MDMA (Ecstasy)Ur Screen: NOT DETECTED
Methadone Scn, Ur: NOT DETECTED
Opiate, Ur Screen: NOT DETECTED
Phencyclidine (PCP) Ur S: NOT DETECTED
Tricyclic, Ur Screen: NOT DETECTED

## 2021-11-01 LAB — URINALYSIS, ROUTINE W REFLEX MICROSCOPIC
Bacteria, UA: NONE SEEN
Bilirubin Urine: NEGATIVE
Glucose, UA: >=500 mg/dL — AB
Ketones, ur: 20 mg/dL — AB
Leukocytes,Ua: NEGATIVE
Nitrite: NEGATIVE
Protein, ur: 30 mg/dL — AB
Specific Gravity, Urine: 1.023 (ref 1.005–1.030)
Squamous Epithelial / HPF: NONE SEEN (ref 0–5)
pH: 5 (ref 5.0–8.0)

## 2021-11-01 LAB — GLUCOSE, CAPILLARY
Glucose-Capillary: 317 mg/dL — ABNORMAL HIGH (ref 70–99)
Glucose-Capillary: 227 mg/dL — ABNORMAL HIGH (ref 70–99)
Glucose-Capillary: 160 mg/dL — ABNORMAL HIGH (ref 70–99)
Glucose-Capillary: 152 mg/dL — ABNORMAL HIGH (ref 70–99)
Glucose-Capillary: 170 mg/dL — ABNORMAL HIGH (ref 70–99)
Glucose-Capillary: 115 mg/dL — ABNORMAL HIGH (ref 70–99)

## 2021-11-01 LAB — BASIC METABOLIC PANEL
Anion gap: 11 (ref 5–15)
Anion gap: 7 (ref 5–15)
BUN: 29 mg/dL — ABNORMAL HIGH (ref 8–23)
BUN: 23 mg/dL (ref 8–23)
CO2: 20 mmol/L — ABNORMAL LOW (ref 22–32)
CO2: 24 mmol/L (ref 22–32)
Calcium: 8.9 mg/dL (ref 8.9–10.3)
Calcium: 8.4 mg/dL — ABNORMAL LOW (ref 8.9–10.3)
Chloride: 107 mmol/L (ref 98–111)
Chloride: 106 mmol/L (ref 98–111)
Creatinine, Ser: 0.97 mg/dL (ref 0.61–1.24)
Creatinine, Ser: 0.87 mg/dL (ref 0.61–1.24)
GFR, Estimated: >60 mL/min (ref >60)
GFR, Estimated: >60 mL/min (ref >60)
Glucose, Bld: 244 mg/dL — ABNORMAL HIGH (ref 70–99)
Glucose, Bld: 123 mg/dL — ABNORMAL HIGH (ref 70–99)
Potassium: 4.4 mmol/L (ref 3.5–5.1)
Potassium: 3.8 mmol/L (ref 3.5–5.1)
Sodium: 138 mmol/L (ref 135–145)
Sodium: 137 mmol/L (ref 135–145)

## 2021-11-01 LAB — BLOOD GAS, ARTERIAL
Acid-base deficit: 7.3 mmol/L — ABNORMAL HIGH (ref 0.0–2.0)
Bicarbonate: 16.9 mmol/L — ABNORMAL LOW (ref 20.0–28.0)
FIO2: 50 %
MECHVT: 500 mL
Mechanical Rate: 18
O2 Saturation: 99.9 %
PEEP: 5 cmH2O
Patient temperature: 37
pCO2 arterial: 30 mmHg — ABNORMAL LOW (ref 32–48)
pH, Arterial: 7.36 (ref 7.35–7.45)
pO2, Arterial: 172 mmHg — ABNORMAL HIGH (ref 83–108)

## 2021-11-01 LAB — TROPONIN I (HIGH SENSITIVITY)
Troponin I (High Sensitivity): 6 ng/L (ref <18)
Troponin I (High Sensitivity): 6 ng/L (ref <18)

## 2021-11-01 LAB — PROCALCITONIN: Procalcitonin: 0.58 ng/mL

## 2021-11-01 LAB — PHOSPHORUS: Phosphorus: 1.5 mg/dL — ABNORMAL LOW (ref 2.5–4.6)

## 2021-11-01 LAB — ETHANOL: Alcohol, Ethyl (B): <10 mg/dL (ref <10)

## 2021-11-01 LAB — LACTIC ACID, PLASMA
Lactic Acid, Venous: 8.6 mmol/L (ref 0.5–1.9)
Lactic Acid, Venous: 3.6 mmol/L (ref 0.5–1.9)

## 2021-11-01 LAB — CBC
HCT: 43.9 % (ref 39.0–52.0)
Hemoglobin: 14 g/dL (ref 13.0–17.0)
MCH: 28 pg (ref 26.0–34.0)
MCHC: 31.9 g/dL (ref 30.0–36.0)
MCV: 87.8 fL (ref 80.0–100.0)
Platelets: 336 10*3/uL (ref 150–400)
RBC: 5 MIL/uL (ref 4.22–5.81)
RDW: 13.3 % (ref 11.5–15.5)
WBC: 19.4 10*3/uL — ABNORMAL HIGH (ref 4.0–10.5)
nRBC: 0 % (ref 0.0–0.2)

## 2021-11-01 LAB — COMPREHENSIVE METABOLIC PANEL
ALT: 18 U/L (ref 0–44)
AST: 34 U/L (ref 15–41)
Albumin: 3.8 g/dL (ref 3.5–5.0)
Alkaline Phosphatase: 100 U/L (ref 38–126)
Anion gap: 19 — ABNORMAL HIGH (ref 5–15)
BUN: 31 mg/dL — ABNORMAL HIGH (ref 8–23)
CO2: 14 mmol/L — ABNORMAL LOW (ref 22–32)
Calcium: 9.3 mg/dL (ref 8.9–10.3)
Chloride: 101 mmol/L (ref 98–111)
Creatinine, Ser: 1.1 mg/dL (ref 0.61–1.24)
GFR, Estimated: >60 mL/min (ref >60)
Glucose, Bld: 442 mg/dL — ABNORMAL HIGH (ref 70–99)
Potassium: 4.2 mmol/L (ref 3.5–5.1)
Sodium: 134 mmol/L — ABNORMAL LOW (ref 135–145)
Total Bilirubin: 0.5 mg/dL (ref 0.3–1.2)
Total Protein: 8.5 g/dL — ABNORMAL HIGH (ref 6.5–8.1)

## 2021-11-01 LAB — PROTIME-INR
INR: 1.2 (ref 0.8–1.2)
Prothrombin Time: 14.8 seconds (ref 11.4–15.2)

## 2021-11-01 LAB — RESP PANEL BY RT-PCR (FLU A&B, COVID) ARPGX2
Influenza A by PCR: NEGATIVE
Influenza B by PCR: NEGATIVE
SARS Coronavirus 2 by RT PCR: NEGATIVE

## 2021-11-01 LAB — MRSA NEXT GEN BY PCR, NASAL: MRSA by PCR Next Gen: NOT DETECTED

## 2021-11-01 LAB — APTT: aPTT: 32 seconds (ref 24–36)

## 2021-11-01 LAB — BETA-HYDROXYBUTYRIC ACID: Beta-Hydroxybutyric Acid: 1.78 mmol/L — ABNORMAL HIGH (ref 0.05–0.27)

## 2021-11-01 LAB — CBG MONITORING, ED: Glucose-Capillary: 325 mg/dL — ABNORMAL HIGH (ref 70–99)

## 2021-11-01 LAB — MAGNESIUM: Magnesium: 1.5 mg/dL — ABNORMAL LOW (ref 1.7–2.4)

## 2021-11-01 MED ORDER — VANCOMYCIN HCL IN DEXTROSE 1-5 GM/200ML-% IV SOLN: 1000.0000 mg | INTRAVENOUS | Status: DC

## 2021-11-01 MED ORDER — SODIUM CHLORIDE 0.9% FLUSH: 3.0000 mL | INTRAVENOUS | Status: DC | PRN

## 2021-11-01 MED ORDER — LORAZEPAM 2 MG/ML IJ SOLN: 1.0000 mg | Freq: Once | INTRAMUSCULAR | Status: AC

## 2021-11-01 MED ORDER — DEXTROSE IN LACTATED RINGERS 5 % IV SOLN: INTRAVENOUS | Status: DC

## 2021-11-01 MED ORDER — ORAL CARE MOUTH RINSE
15.0000 mL | OROMUCOSAL | Status: DC
  Administered 2021-11-01 – 2021-11-13 (×112): 15 mL via OROMUCOSAL

## 2021-11-01 MED ORDER — NOREPINEPHRINE 4 MG/250ML-% IV SOLN
2.0000 ug/min | INTRAVENOUS | Status: DC
  Administered 2021-11-02: 4 ug/min via INTRAVENOUS
  Administered 2021-11-03: 7 ug/min via INTRAVENOUS
  Administered 2021-11-03: 2 ug/min via INTRAVENOUS
  Filled 2021-11-01 (×3): qty 250

## 2021-11-01 MED ORDER — SODIUM CHLORIDE 0.9 % IV SOLN
1000.0000 mL | Freq: Once | INTRAVENOUS | Status: AC
  Administered 2021-11-01: 1000 mL via INTRAVENOUS

## 2021-11-01 MED ORDER — CHLORHEXIDINE GLUCONATE 0.12% ORAL RINSE (MEDLINE KIT)
15.0000 mL | Freq: Two times a day (BID) | OROMUCOSAL | Status: DC
  Administered 2021-11-01 – 2021-11-14 (×26): 15 mL via OROMUCOSAL

## 2021-11-01 MED ORDER — SODIUM CHLORIDE 0.9 % IV SOLN
2.0000 g | Freq: Two times a day (BID) | INTRAVENOUS | Status: DC
  Administered 2021-11-02: 2 g via INTRAVENOUS
  Filled 2021-11-01: qty 2
  Filled 2021-11-01: qty 12.5

## 2021-11-01 MED ORDER — VANCOMYCIN HCL IN DEXTROSE 1-5 GM/200ML-% IV SOLN: 1000.0000 mg | Freq: Once | INTRAVENOUS | Status: DC

## 2021-11-01 MED ORDER — INSULIN REGULAR(HUMAN) IN NACL 100-0.9 UT/100ML-% IV SOLN
INTRAVENOUS | Status: DC
  Administered 2021-11-01: 9.5 [IU]/h via INTRAVENOUS
  Filled 2021-11-01: qty 100

## 2021-11-01 MED ORDER — POLYETHYLENE GLYCOL 3350 17 G PO PACK: 17.0000 g | PACK | Freq: Every day | ORAL | Status: DC | PRN

## 2021-11-01 MED ORDER — LACTATED RINGERS IV BOLUS
1000.0000 mL | Freq: Once | INTRAVENOUS | Status: AC
  Administered 2021-11-01: 1000 mL via INTRAVENOUS

## 2021-11-01 MED ORDER — SODIUM CHLORIDE 0.9% FLUSH
3.0000 mL | Freq: Two times a day (BID) | INTRAVENOUS | Status: DC
  Administered 2021-11-01 – 2021-11-14 (×25): 3 mL via INTRAVENOUS

## 2021-11-01 MED ORDER — POTASSIUM CHLORIDE 10 MEQ/100ML IV SOLN
10.0000 meq | INTRAVENOUS | Status: AC
  Administered 2021-11-01: 10 meq via INTRAVENOUS
  Filled 2021-11-01 (×2): qty 100

## 2021-11-01 MED ORDER — POLYETHYLENE GLYCOL 3350 17 G PO PACK
17.0000 g | PACK | Freq: Every day | ORAL | Status: DC
  Administered 2021-11-01 – 2021-11-05 (×3): 17 g
  Filled 2021-11-01 (×4): qty 1

## 2021-11-01 MED ORDER — FENTANYL CITRATE PF 50 MCG/ML IJ SOSY
25.0000 ug | PREFILLED_SYRINGE | INTRAMUSCULAR | Status: DC | PRN
  Administered 2021-11-01 – 2021-11-02 (×2): 100 ug via INTRAVENOUS
  Filled 2021-11-01 (×2): qty 2

## 2021-11-01 MED ORDER — ACETAMINOPHEN 325 MG PO TABS
650.0000 mg | ORAL_TABLET | ORAL | Status: DC | PRN
  Administered 2021-11-01 – 2021-11-06 (×5): 650 mg
  Filled 2021-11-01 (×5): qty 2

## 2021-11-01 MED ORDER — LACTATED RINGERS IV SOLN: INTRAVENOUS | Status: DC

## 2021-11-01 MED ORDER — INSULIN ASPART 100 UNIT/ML IJ SOLN
0.0000 [IU] | INTRAMUSCULAR | Status: DC
  Administered 2021-11-02 (×2): 2 [IU] via SUBCUTANEOUS
  Administered 2021-11-03: 3 [IU] via SUBCUTANEOUS
  Administered 2021-11-03 (×3): 2 [IU] via SUBCUTANEOUS
  Administered 2021-11-04 (×3): 1 [IU] via SUBCUTANEOUS
  Administered 2021-11-05 – 2021-11-06 (×7): 2 [IU] via SUBCUTANEOUS
  Administered 2021-11-06: 3 [IU] via SUBCUTANEOUS
  Administered 2021-11-06: 4 [IU] via SUBCUTANEOUS
  Administered 2021-11-06 – 2021-11-07 (×3): 2 [IU] via SUBCUTANEOUS
  Administered 2021-11-07 (×2): 3 [IU] via SUBCUTANEOUS
  Administered 2021-11-07 (×2): 2 [IU] via SUBCUTANEOUS
  Administered 2021-11-08: 3 [IU] via SUBCUTANEOUS
  Administered 2021-11-08 (×2): 2 [IU] via SUBCUTANEOUS
  Administered 2021-11-08 – 2021-11-09 (×4): 3 [IU] via SUBCUTANEOUS
  Administered 2021-11-09 (×2): 2 [IU] via SUBCUTANEOUS
  Administered 2021-11-09: 3 [IU] via SUBCUTANEOUS
  Administered 2021-11-09 – 2021-11-10 (×2): 2 [IU] via SUBCUTANEOUS
  Administered 2021-11-10: 1 [IU] via SUBCUTANEOUS
  Administered 2021-11-10 (×3): 2 [IU] via SUBCUTANEOUS
  Administered 2021-11-10 – 2021-11-12 (×5): 1 [IU] via SUBCUTANEOUS
  Administered 2021-11-12: 3 [IU] via SUBCUTANEOUS
  Administered 2021-11-12: 1 [IU] via SUBCUTANEOUS
  Administered 2021-11-12: 3 [IU] via SUBCUTANEOUS
  Administered 2021-11-13: 2 [IU] via SUBCUTANEOUS
  Administered 2021-11-13 (×2): 5 [IU] via SUBCUTANEOUS
  Administered 2021-11-13: 2 [IU] via SUBCUTANEOUS
  Administered 2021-11-13: 3 [IU] via SUBCUTANEOUS
  Administered 2021-11-13: 5 [IU] via SUBCUTANEOUS
  Administered 2021-11-14 (×3): 3 [IU] via SUBCUTANEOUS
  Filled 2021-11-01 (×56): qty 1

## 2021-11-01 MED ORDER — SUCCINYLCHOLINE CHLORIDE 20 MG/ML IJ SOLN
INTRAMUSCULAR | Status: AC | PRN
  Administered 2021-11-01: 80 mg via INTRAVENOUS

## 2021-11-01 MED ORDER — PANTOPRAZOLE 2 MG/ML SUSPENSION
40.0000 mg | Freq: Every day | ORAL | Status: DC
  Administered 2021-11-01 – 2021-11-14 (×12): 40 mg
  Filled 2021-11-01 (×12): qty 20

## 2021-11-01 MED ORDER — MAGNESIUM SULFATE 4 GM/100ML IV SOLN
4.0000 g | Freq: Once | INTRAVENOUS | Status: AC
Start: 2021-11-01 — End: 2021-11-02
  Administered 2021-11-01: 4 g via INTRAVENOUS
  Filled 2021-11-01: qty 100

## 2021-11-01 MED ORDER — CHLORHEXIDINE GLUCONATE CLOTH 2 % EX PADS
6.0000 | MEDICATED_PAD | Freq: Every day | CUTANEOUS | Status: DC
Start: 2021-11-02 — End: 2021-11-14
  Administered 2021-11-01 – 2021-11-14 (×15): 6 via TOPICAL

## 2021-11-01 MED ORDER — DOCUSATE SODIUM 100 MG PO CAPS: 100.0000 mg | ORAL_CAPSULE | Freq: Two times a day (BID) | ORAL | Status: DC | PRN

## 2021-11-01 MED ORDER — ACETAMINOPHEN 325 MG PO TABS: 650.0000 mg | ORAL_TABLET | ORAL | Status: DC | PRN

## 2021-11-01 MED ORDER — DOCUSATE SODIUM 50 MG/5ML PO LIQD
100.0000 mg | Freq: Two times a day (BID) | ORAL | Status: DC
  Administered 2021-11-01 – 2021-11-05 (×4): 100 mg
  Filled 2021-11-01 (×7): qty 10

## 2021-11-01 MED ORDER — DEXTROSE 50 % IV SOLN
0.0000 mL | INTRAVENOUS | Status: DC | PRN
  Filled 2021-11-01: qty 50

## 2021-11-01 MED ORDER — VANCOMYCIN HCL 1500 MG/300ML IV SOLN
1500.0000 mg | Freq: Once | INTRAVENOUS | Status: AC
  Administered 2021-11-01: 1500 mg via INTRAVENOUS
  Filled 2021-11-01 (×2): qty 300

## 2021-11-01 MED ORDER — INSULIN DETEMIR 100 UNIT/ML ~~LOC~~ SOLN
0.3000 [IU]/kg | SUBCUTANEOUS | Status: DC
  Administered 2021-11-01 – 2021-11-04 (×4): 17 [IU] via SUBCUTANEOUS
  Filled 2021-11-01 (×4): qty 0.17

## 2021-11-01 MED ORDER — PROPOFOL 1000 MG/100ML IV EMUL
5.0000 ug/kg/min | INTRAVENOUS | Status: DC
  Administered 2021-11-01: 15 ug/kg/min via INTRAVENOUS

## 2021-11-01 MED ORDER — SODIUM CHLORIDE 0.9 % IV SOLN: 250.0000 mL | INTRAVENOUS | Status: DC | PRN

## 2021-11-01 MED ORDER — POTASSIUM PHOSPHATES 15 MMOLE/5ML IV SOLN
45.0000 mmol | Freq: Once | INTRAVENOUS | Status: AC
  Administered 2021-11-01: 45 mmol via INTRAVENOUS
  Filled 2021-11-01: qty 15

## 2021-11-01 MED ORDER — SODIUM CHLORIDE 0.9 % IV SOLN
250.0000 mL | INTRAVENOUS | Status: DC
  Administered 2021-11-01: 250 mL via INTRAVENOUS

## 2021-11-01 MED ORDER — SODIUM CHLORIDE 0.9 % IV SOLN
2.0000 g | Freq: Once | INTRAVENOUS | Status: AC
  Administered 2021-11-01: 2 g via INTRAVENOUS
  Filled 2021-11-01: qty 12.5

## 2021-11-01 MED ORDER — ETOMIDATE 2 MG/ML IV SOLN
INTRAVENOUS | Status: AC | PRN
  Administered 2021-11-01: 20 mg via INTRAVENOUS

## 2021-11-01 MED ORDER — ONDANSETRON HCL 4 MG/2ML IJ SOLN: 4.0000 mg | Freq: Four times a day (QID) | INTRAMUSCULAR | Status: DC | PRN

## 2021-11-01 MED ORDER — FAMOTIDINE IN NACL 20-0.9 MG/50ML-% IV SOLN
20.0000 mg | Freq: Two times a day (BID) | INTRAVENOUS | Status: DC
  Administered 2021-11-01 – 2021-11-03 (×6): 20 mg via INTRAVENOUS
  Filled 2021-11-01 (×6): qty 50

## 2021-11-01 MED ORDER — PROPOFOL 1000 MG/100ML IV EMUL
INTRAVENOUS | Status: AC
  Administered 2021-11-01: 5 ug/kg/min via INTRAVENOUS
  Filled 2021-11-01: qty 100

## 2021-11-01 MED ORDER — PROPOFOL 1000 MG/100ML IV EMUL: 5.0000 ug/kg/min | INTRAVENOUS | Status: DC

## 2021-11-01 MED ORDER — METRONIDAZOLE 500 MG/100ML IV SOLN
500.0000 mg | Freq: Once | INTRAVENOUS | Status: AC
  Administered 2021-11-01: 500 mg via INTRAVENOUS
  Filled 2021-11-01: qty 100

## 2021-11-01 MED ORDER — LORAZEPAM 2 MG/ML IJ SOLN
INTRAMUSCULAR | Status: AC
  Administered 2021-11-01: 1 mg via INTRAVENOUS
  Filled 2021-11-01: qty 1

## 2021-11-01 MED ORDER — FENTANYL CITRATE PF 50 MCG/ML IJ SOSY: 25.0000 ug | PREFILLED_SYRINGE | INTRAMUSCULAR | Status: DC | PRN

## 2021-11-01 NOTE — ED Notes (Signed)
Dr. Cyril Loosen at bedside speaking with pt's son at bedside, and with other son on phone, explaining pt's condition and plan of care. Dr. Cyril Loosen discussing options with family.  Family tells Dr. Cyril Loosen they want pt. Treated aggressively. Dr. Cyril Loosen explains to pt's family that he is not a surgical candidate. Family states they want further treatment. ?

## 2021-11-01 NOTE — Sepsis Progress Note (Signed)
eLink is following this Code Sepsis. °

## 2021-11-01 NOTE — Code Documentation (Signed)
NG insertec, taped at last mark. Confirmed placement with auscultation of gastric air. ?

## 2021-11-01 NOTE — Progress Notes (Signed)
CODE SEPSIS - PHARMACY COMMUNICATION ? ?**Broad Spectrum Antibiotics should be administered within 1 hour of Sepsis diagnosis** ? ?Time Code Sepsis Called/Page Received: 1524 ? ?Antibiotics Ordered: cefepime, vancomycin, metronidazole ? ?Time of 1st antibiotic administration: 1611 ? ? ? ?Raiford Noble ,PharmD ?Clinical Pharmacist  ?11/01/2021  3:26 PM ? ?

## 2021-11-01 NOTE — Progress Notes (Signed)
Pharmacy Antibiotic Note ? ?Christopher [name] is a 88 y.o. male admitted on 11/01/2021 with sepsis.  Pharmacy has been consulted for vancomycin and cefepime dosing. ? ?Plan: ?Cefepime 2 g IV Q12H ?Vancomycin 1500 mg IV x 1 loading dose followed by vancomycin 1000 mg IV Q36H ?Est AUC: 515.5  ?Used: Scr 1.1, IBW, Vd 0.72 ?Obtain vanc levels around 4th or 5th dose if continued ?Monitor renal function and adjust dose as clinically indicated ? ?Weight: 68 kg (149 lb 14.6 oz) ? ?Temp (24hrs), Avg:96.6 ?F (35.9 ?C), Min:92 ?F (33.3 ?C), Max:99.6 ?F (37.6 ?C) ? ?Recent Labs  ?Lab 11/01/21 ?1354  ?WBC 19.4*  ?CREATININE 1.10  ?LATICACIDVEN 8.6*  ?  ?CrCl cannot be calculated (Unknown ideal weight.).   ? ?No Known Allergies ? ?Antimicrobials this admission: ?4/28 metronidazole x 1 ?4/28 cefepime >>  ?4/28 vancomycin >>  ? ?Dose adjustments this admission: ? ? ?Microbiology results: ?4/28 BCx: pending ?4/28 MRSA PCR: pending ? ?Thank you for allowing pharmacy to be a part of this patient?s care. ? ?Robyne Peers Jacinto Keil ?11/01/2021 5:12 PM ? ?

## 2021-11-01 NOTE — ED Notes (Signed)
Bear hugger removed.

## 2021-11-01 NOTE — H&P (Signed)
? ?NAME:  Christopher [name], MRN:  [id number], DOB:  1926/01/26, LOS: 0 ?ADMISSION DATE:  11/01/2021, CONSULTATION DATE:  11/01/2021 ?REFERRING MD: Dr. Corky Downs, CHIEF COMPLAINT:  Unresponsive  ? ?Brief Pt Description / Synopsis:  ?88 y.o. Male admitted with Acute Metabolic Encephalopathy in setting of large subdural hematoma with associated mass effect with left midline shift.  Pt deemed not a surgical candidate by Neurosurgery.  Pt also found to have DKA. ? ?History of Present Illness:  ?Christopher [name] is a 88 year old male with unknown past medical history who presents to [facility] ED on 11/01/2021 due to unresponsiveness. Pt is currently intubated and unable to provide history and no family is currently available, therefore history is obtained from chart review. ? ?EMS reported the patient had a fall off the toilet, apparently hit his head, and was unresponsive.  EMS noted the patient to be posturing with them. ? ?Shortly after arrival to the ED he was intubated for airway protection due to GCS score of 3.  Patient remained unresponsive with fixed pupils, along with decorticate posturing noted on the right. ? ?ED Course: ?Initial Vital Signs: Temperature 92 Fahrenheit, pulse 95, respiratory rate 18, BP 158/103, SPO2 100% ?Significant Labs: Sodium 134, bicarb 14, glucose 442, BUN 31, creatinine 1.1, anion gap 19, high-sensitivity troponin 6, lactic acid 8.6, WBC 19.4, PT 14.8, INR 1.2, beta hydroxybutyric acid 1.78 ?Urinalysis negative for UTI ?Urine drug screen negative ?Imaging ?Chest X-ray>>FINDINGS: ?Interval placement of ET tube with tip positioned approximately 2.0 ?cm from the carina. Enteric tube coursing below the diaphragm. ?Cardiac and mediastinal contours are unchanged. Mild bibasilar ?atelectasis. Trace right pleural effusion. ?CT head without contrast>>IMPRESSION: ?1. Increased size of a now large (2 cm thick) acute right cerebral ?convexity subdural hemorrhage. Significant associated mass effect ?with 1.4 cm of  leftward midline shift. Recommend urgent ?neurosurgical consultation. ?2. Rounding of the left temporal horn, compatible with ventricular ?entrapment. ?3. 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 ?Medications Administered: 1 L normal saline bolus, cefepime, Flagyl, vancomycin, insulin drip ? ?ED provider discussed with Dr. Lacinda Axon of Neurosurgery regarding CT head showing large subdural hematoma.  Neurosurgery deemed that patient was not a surgical candidate.  He was given IV fluids along with broad-spectrum antibiotics including IV cefepime, Flagyl, vancomycin as he met SIRS criteria.  He was also placed on IV insulin drip due to meeting DKA criteria. ? ?PCCM is asked to admit the patient to ICU for further work-up and treatment. ? ?Pertinent  Medical History  ?No past medical history on file. ? ? ?Micro Data:  ?4/28: SARS-CoV-2 and influenza PCR>>negative ?4/28: Blood culture x2>> ? ?Antimicrobials:  ?Cefepime 4/28>> ?Vancomycin 4/28>> ?Flagyl 4/28 x 1 dose ? ?Significant Hospital Events: ?Including procedures, antibiotic start and stop dates in addition to other pertinent events   ?4/28: Presented to ER unresponsive, fixed pupils, posturing.  Intubated for airway protection.  Found to have large subdural hematoma with mass effect and left shift.  Deemed not a surgical candidate by neurosurgery.  Recommended comfort care, however family wants all aggressive measures.  PCCM asked to admit ? ?Interim History / Subjective:  ?-Patient examined in the ICU, currently on propofol ?-Patient noted to have positive cough/gag reflex, spontaneous respiratory effort when placed and pressure support ventilation, and withdraws bilateral lower extremities to pain ?-Pupils are fixed, right pupil 5 mm, left pupil 3 mm ?-Noted to become hypotensive with blood pressure 80/47, giving additional 1 L LR bolus ~ fluid responsive ? ?Objective   ?  Blood pressure 106/73, pulse 79,  temperature (!) 97.2 ?F (36.2 ?C), resp. rate (!) 23, weight 68 kg, SpO2 100 %. ?   ?Vent Mode: AC ?FiO2 (%):  [50 %] 50 % ?Set Rate:  [18 bmp] 18 bmp ?Vt Set:  [500 mL] 500 mL ?PEEP:  [5 cmH20] 5 cmH20  ?No intake or output data in the 24 hours ending 11/01/21 1628 ?Filed Weights  ? 11/01/21 1438  ?Weight: 68 kg  ? ? ?Examination: ?General: Critically ill-appearing male, laying in bed, intubated and sedated on propofol, no acute distress ?HENT:  ?Lungs: Clear breath sounds bilaterally, and no wheezing or rales noted, occasionally with spontaneous breath above the vent, even, nonlabored ?Cardiovascular: Regular rate and rhythm, S1-S2, no murmurs, rubs, gallops ?Abdomen: Soft, nontender, nondistended, no guarding rebound tenderness, bowel sounds positive x4 ?Extremities: Normal bulk and tone, no deformities, no edema ?Neuro: On propofol, however does have positive cough/gag reflex, withdraws all extremities to pain, pupils fixed and unequal (right 5 mm, left 3 mm) ?GU: Foley catheter in place ? ?Resolved Hospital Problem list   ? ? ?Assessment & Plan:  ? ?Acute Metabolic Encephalopathy in setting of Large Subdural Hematoma with associated mass effect and left midline shift ?Sedation needs in setting of mechanical ventilation ?-Maintain a RASS goal of 0 to -1 ?-Propofol and PRN fentanyl as needed to maintain RASS goal ?-Avoid sedating medications as able ?-Daily wake up assessment ?-Frequent Neuro checks ?-Patient deemed not a surgical candidate by neurosurgery due to near zero chance of meaningful neurological recovery ?-Goal SBP <140 ?-NO antiplatelets or anticoagulants ?-Implement measures to reduce ICP: HOB >30 degrees, prevent Hypoxia and Hypotension ?-Currently exhibiting no signs of seizures, will hold off on empiric AED's for now ?-Discussed with Dr. Mortimer Fries (who discussed with Neurosurgery), will not initiate Hypertonic Saline or other Hyperosmolar agents as it is unlikely to impact overall/long term  neurological prognosis ?-Consider Neurology consult ? ?Intubated for airway protection ?-Full vent support, implement lung protective strategies ?-Plateau pressures less than 30 cm H20 ?-Wean FiO2 & PEEP as tolerated to maintain O2 sats >92% ?-Follow intermittent Chest X-ray & ABG as needed ?-Spontaneous Breathing Trials when respiratory parameters met and mental status permits ?-Implement VAP Bundle ?-Prn Bronchodilators ? ?DKA ?-Follow DKA protocol ?-Aggressive IV fluids ?-Insulin drip ?-Follow BMP every 4 hours, once anion gap closed and serum bicarb greater than 20, can convert to long-acting insulin and sliding scale insulin ?-Consult diabetes coordinator, appreciate input ?-Follow ICU Hypo/Hyperglycemia protocol ? ?Meets SIRS Criteria at admission (T 92 F, Pulse 95, WBC 19.4, Lactic 8.6) ?Currently no source of infection identified ?-Monitor fever curve ?-Trend WBC's & Procalcitonin ?-Follow cultures as above ?-Continue empiric Cefepime and Vancomycin pending cultures & sensitivities ?-Urinalysis negative for UTI, chest x-ray without evidence of pneumonia ? ?Mild Hyponatremia, suspect hypertonic given DKA (corrected Na given glucose of 442 is Na 139) ?-Monitor I&O's / urinary output ?-Follow BMP ?-Ensure adequate renal perfusion ?-Avoid nephrotoxic agents as able ?-Replace electrolytes as indicated ?-IV fluids ? ? ? ? ?Best Practice (right click and "Reselect all SmartList Selections" daily)  ? ?Diet/type: NPO ?DVT prophylaxis: SCD ?GI prophylaxis: PPI ?Lines: N/A ?Foley:  N/A ?Code Status:  full code ?Last date of multidisciplinary goals of care discussion [N/A] ? ?Pt's prognosis is extremely poor given large subdural hematoma with associated mass effect with left midline shift and not being a surgical candidate.  High risk for progression to herniation, cardiac arrest, and death.  Recommend DNR status with transition to COMFORT  MEASURES. ? ?ADDENDUM ?Pt's son came to bedside at approximately 19:00, Dr. Mortimer Fries  discussed poor prognosis.  Code status changed to DNR, however would like to continue current treatment measures. ? ?Labs   ?CBC: ?Recent Labs  ?Lab 11/01/21 ?1354  ?WBC 19.4*  ?HGB 14.0  ?HCT 43.9  ?MCV 87.8  ?PLT 336  ? ?

## 2021-11-01 NOTE — ED Notes (Signed)
To CT

## 2021-11-01 NOTE — Progress Notes (Signed)
?   11/01/21 1500  ?Clinical Encounter Type  ?Visited With Family  ?Visit Type Initial;ED  ?Referral From Nurse  ? ?Chaplain provided support through prayer.  ?

## 2021-11-01 NOTE — Progress Notes (Addendum)
PHARMACY -  BRIEF ANTIBIOTIC NOTE  ? ?Pharmacy has received consult(s) for vancomycin and cefepime from an ED provider.  The patient's profile has been reviewed for ht/wt/allergies/indication/available labs.   ? ?One time order(s) placed for ?Cefepime 2 g IV ?Vancomycin 1500 mg IV ? ?Further antibiotics/pharmacy consults should be ordered by admitting physician if indicated.       ?                ?Thank you, ?Christopher [name] ?11/01/2021  3:28 PM ? ?

## 2021-11-01 NOTE — ED Notes (Signed)
Returned from CT.

## 2021-11-01 NOTE — ED Provider Notes (Addendum)
? ?[facility] ?Provider Note ? ? ? Event Date/Time  ? First MD Initiated Contact with Patient 11/01/21 1345   ?  (approximate) ? ? ?History  ? ?Altered mental status, unresponsive ? ?HPI ? ?Christopher [name] is a 88 y.o. male with unclear past medical history who presents unresponsive.  EMS reports the patient had a fall off of the toilet, apparently hit his head and was unresponsive.  Unknown medications at this time.  EMS reports patient started to posture upon arrival to the emergency department ?  ? ? ?Physical Exam  ? ?Triage Vital Signs: ?ED Triage Vitals  ?Enc Vitals Group  ?   BP 11/01/21 1340 (!) 158/103  ?   Pulse Rate 11/01/21 1340 95  ?   Resp 11/01/21 1340 18  ?   Temp 11/01/21 1355 (!) 92 ?F (33.3 ?C)  ?   Temp src --   ?   SpO2 11/01/21 1340 100 %  ?   Weight 11/01/21 1438 68 kg (149 lb 14.6 oz)  ?   Height --   ?   Head Circumference --   ?   Peak Flow --   ?   Pain Score --   ?   Pain Loc --   ?   Pain Edu? --   ?   Excl. in [place]? --   ? ? ?Most recent vital signs: ?Vitals:  ? 11/01/21 1420 11/01/21 1425  ?BP:    ?Pulse: 92 85  ?Resp: (!) 24 17  ?Temp: (!) 95.1 ?F (35.1 ?C) (!) 95.1 ?F (35.1 ?C)  ?SpO2: 100% 100%  ? ? ? ?General: Unresponsive, pupils fixed  ?CV:  Good peripheral perfusion.  ?Resp:  Labored ?Abd:  No distention.  ?Other:  Decorticate posturing noted on the right ? ? ?ED Results / Procedures / Treatments  ? ?Labs ?(all labs ordered are listed, but only abnormal results are displayed) ?Labs Reviewed  ?CBC - Abnormal; Notable for the following components:  ?    Result Value  ? WBC 19.4 (*)   ? All other components within normal limits  ?COMPREHENSIVE METABOLIC PANEL - Abnormal; Notable for the following components:  ? Sodium 134 (*)   ? CO2 14 (*)   ? Glucose, Bld 442 (*)   ? BUN 31 (*)   ? Total Protein 8.5 (*)   ? Anion gap 19 (*)   ? All other components within normal limits  ?LACTIC ACID, PLASMA - Abnormal; Notable for the following components:  ? Lactic Acid, Venous  8.6 (*)   ? All other components within normal limits  ?URINALYSIS, ROUTINE W REFLEX MICROSCOPIC - Abnormal; Notable for the following components:  ? Color, Urine YELLOW (*)   ? APPearance HAZY (*)   ? Glucose, UA >=500 (*)   ? Hgb urine dipstick SMALL (*)   ? Ketones, ur 20 (*)   ? Protein, ur 30 (*)   ? All other components within normal limits  ?BETA-HYDROXYBUTYRIC ACID - Abnormal; Notable for the following components:  ? Beta-Hydroxybutyric Acid 1.78 (*)   ? All other components within normal limits  ?CULTURE, BLOOD (ROUTINE X 2)  ?CULTURE, BLOOD (ROUTINE X 2)  ?RESP PANEL BY RT-PCR (FLU A&B, COVID) ARPGX2  ?APTT  ?PROTIME-INR  ?LACTIC ACID, PLASMA  ?URINE DRUG SCREEN, QUALITATIVE ([facility] ONLY)  ?BLOOD GAS, ARTERIAL  ?ETHANOL  ?CBG MONITORING, ED  ?TROPONIN I (HIGH SENSITIVITY)  ?TROPONIN I (HIGH SENSITIVITY)  ? ? ? ?EKG ?ED ECG REPORT ?  I, Lavonia Drafts, the attending physician, personally viewed and interpreted this ECG. ? ?Date: 11/01/2021 ? ?Rhythm: Sinus tachycardia ?QRS Axis: normal ?Intervals: normal ?ST/T Wave abnormalities: normal ?Narrative Interpretation: no evidence of acute ischemia ? ? ? ?RADIOLOGY ?Chest x-ray reviewed by me, ET tube in good position ?CT head reviewed by me, large subdural hematoma noted ? ? ? ?PROCEDURES: ? ?Critical Care performed: yes ? ?CRITICAL CARE ?Performed by: Lavonia Drafts ? ? ?Total critical care time: 45 minutes ? ?Critical care time was exclusive of separately billable procedures and treating other patients. ? ?Critical care was necessary to treat or prevent imminent or life-threatening deterioration. ? ?Critical care was time spent personally by me on the following activities: development of treatment plan with patient and/or surrogate as well as nursing, discussions with consultants, evaluation of patient's response to treatment, examination of patient, obtaining history from patient or surrogate, ordering and performing treatments and interventions, ordering and  review of laboratory studies, ordering and review of radiographic studies, pulse oximetry and re-evaluation of patient's condition. ? ? ?Date/Time: 11/01/2021 3:22 PM ?Performed by: Lavonia Drafts, MD ?Pre-anesthesia Checklist: Patient identified, Emergency Drugs available, Suction available, Patient being monitored and Timeout performed ?Induction Type: IV induction and Rapid sequence ?Laryngoscope Size: Glidescope and 4 ?Grade View: Grade II ?Tube size: 7.5 mm ?Tube secured with: ETT holder ?Dental Injury: Teeth and Oropharynx as per pre-operative assessment  ? ? ? ? ? ?MEDICATIONS ORDERED IN ED: ?Medications  ?succinylcholine (ANECTINE) injection (80 mg Intravenous Given 11/01/21 1342)  ?etomidate (AMIDATE) injection (20 mg Intravenous Given 11/01/21 1343)  ?propofol (DIPRIVAN) 1000 MG/100ML infusion (25 mcg/kg/min Intravenous Rate/Dose Change 11/01/21 1406)  ?insulin regular, human (MYXREDLIN) 100 units/ 100 mL infusion (has no administration in time range)  ?lactated ringers infusion (has no administration in time range)  ?dextrose 5 % in lactated ringers infusion (has no administration in time range)  ?dextrose 50 % solution 0-50 mL (has no administration in time range)  ?potassium chloride 10 mEq in 100 mL IVPB (has no administration in time range)  ?sodium chloride flush (NS) 0.9 % injection 3 mL (has no administration in time range)  ?sodium chloride flush (NS) 0.9 % injection 3 mL (has no administration in time range)  ?0.9 %  sodium chloride infusion (has no administration in time range)  ?acetaminophen (TYLENOL) tablet 650 mg (has no administration in time range)  ?docusate sodium (COLACE) capsule 100 mg (has no administration in time range)  ?polyethylene glycol (MIRALAX / GLYCOLAX) packet 17 g (has no administration in time range)  ?ondansetron (ZOFRAN) injection 4 mg (has no administration in time range)  ?famotidine (PEPCID) IVPB 20 mg premix (has no administration in time range)  ?0.9 %  sodium chloride  infusion (1,000 mLs Intravenous New Bag/Given 11/01/21 1456)  ?LORazepam (ATIVAN) injection 1 mg (1 mg Intravenous Given 11/01/21 1457)  ? ? ? ?IMPRESSION / MDM / ASSESSMENT AND PLAN / ED COURSE  ?I reviewed the triage vital signs and the nursing notes. ? ?Patient resorts unresponsive, possible head injury possible syncope.  Concerning posturing upon arrival.  Intubated for airway protection and because of GCS of 3 ? ?Sent for stat CT scan which confirmed a large subdural hematoma ? ?Discussed with Dr. Lacinda Axon of neurosurgery who reports patient is not an operative candidate  ? ?Lab work is notable for elevated white blood cell count, elevated lactic acid, elevated beta hydroxybutyric acid and elevated glucose with elevated anion gap suspicious for DKA ? ?I suspect elevated lactic acid related  to intracranial hemorrhage/DKA and not sepsis, regardless will cover with broad-spectrum IV antibiotics. ? ?Discussed patient's poor prognosis with sons, recommended comfort care however they have requested aggressive care.  Given that we will start IV fluids, IV insulin drip ? ?Discussed with Dr. Mortimer Fries of the intensive care unit, he will admit the patient ? ? ? ? ? ? ?  ? ? ?FINAL CLINICAL IMPRESSION(S) / ED DIAGNOSES  ? ?Final diagnoses:  ?Subdural hematoma ([place])  ?Diabetic ketoacidosis with coma associated with other specified diabetes mellitus ([place])  ? ? ? ?Rx / DC Orders  ? ?ED Discharge Orders   ? ? None  ? ?  ? ? ? ?Note:  This document was prepared using Dragon voice recognition software and may include unintentional dictation errors. ?  ?Lavonia Drafts, MD ?11/01/21 1523 ? ?  ?Lavonia Drafts, MD ?11/01/21 1525 ? ?

## 2021-11-01 NOTE — ED Triage Notes (Signed)
Pt. To ED via EMS after falling in bathroom and hitting right forehead. Pt. Was found unconscious by family and called 911. En route, EMS reports pt. Began posturing and seizure like activity. Upon arrival to ED, pt. Is unresponsive. Pt. Has soiled himself, secretions coming out of mouth. MD at bedside. ?

## 2021-11-01 NOTE — Plan of Care (Signed)
Admitted to ICU on ventilator with SAH.  Pupils non reactive.  +Gag and cough.  Withdraws to pain. ?

## 2021-11-01 NOTE — Progress Notes (Signed)
I was consulted on this patient in ED. He had sustained a fall and on arrival had poor neurological exam. He was intubated and CT head showed a large acute subdural hematoma with significant midline shift. At time of consult, his pupils were large and unreactive per ED exam. Given this poor exam, his age, and severe injury, surgical intervention has near zero chance of returning him to his baseline. The futility of surgery was relayed t o the family.

## 2021-11-01 NOTE — Progress Notes (Signed)
Order placed to access & place Ultrasound IV for possible pressers. Pt receiving IV fluid bolus -current SBP 118 - Pt already has 3 IV's Nothing visualized only place left on Rt anterior FA - noted small vein Rt posterior lower FA attempted - pt noted to have posturing and rotation of Rt arm which made IV insertion impossible. Placed # 22 1" on Rt hand to give him 4th IV access  for other incompatible drugs. RN aware if she is to start pressers to use upper FA IV's not his hand.  ?

## 2021-11-01 NOTE — ED Notes (Signed)
Bear hugger applied 

## 2021-11-01 NOTE — ED Notes (Signed)
RT and this RN transferred pt. To ICU 16 ?

## 2021-11-01 NOTE — ED Notes (Addendum)
Pt. Linens changed, stool cleaned, new brief and linens applied. During bathing process, pt. Moving legs. ?

## 2021-11-02 ENCOUNTER — Other Ambulatory Visit: Payer: Self-pay

## 2021-11-02 ENCOUNTER — Encounter
Admission: EM | Disposition: A | Discharge status: Nursing Home (Includes ICF) | Payer: Self-pay | Source: Home / Self Care | Attending: Internal Medicine

## 2021-11-02 ENCOUNTER — Inpatient Hospital Stay: Payer: Medicare HMO | Admitting: Anesthesiology

## 2021-11-02 ENCOUNTER — Inpatient Hospital Stay: Payer: Medicare HMO

## 2021-11-02 DIAGNOSIS — G939 Disorder of brain, unspecified: Secondary | ICD-10-CM | POA: Diagnosis not present

## 2021-11-02 HISTORY — PX: CRANIOTOMY: SHX93

## 2021-11-02 HISTORY — PX: TRACHEOSTOMY TUBE PLACEMENT: SHX814

## 2021-11-02 LAB — GLUCOSE, CAPILLARY
Glucose-Capillary: 100 mg/dL — ABNORMAL HIGH (ref 70–99)
Glucose-Capillary: 106 mg/dL — ABNORMAL HIGH (ref 70–99)
Glucose-Capillary: 130 mg/dL — ABNORMAL HIGH (ref 70–99)
Glucose-Capillary: 139 mg/dL — ABNORMAL HIGH (ref 70–99)
Glucose-Capillary: 152 mg/dL — ABNORMAL HIGH (ref 70–99)
Glucose-Capillary: 190 mg/dL — ABNORMAL HIGH (ref 70–99)

## 2021-11-02 LAB — BASIC METABOLIC PANEL
Anion gap: 11 (ref 5–15)
BUN: 17 mg/dL (ref 8–23)
CO2: 21 mmol/L — ABNORMAL LOW (ref 22–32)
Calcium: 8.4 mg/dL — ABNORMAL LOW (ref 8.9–10.3)
Chloride: 103 mmol/L (ref 98–111)
Creatinine, Ser: 0.83 mg/dL (ref 0.61–1.24)
GFR, Estimated: >60 mL/min (ref >60)
Glucose, Bld: 100 mg/dL — ABNORMAL HIGH (ref 70–99)
Potassium: 4.1 mmol/L (ref 3.5–5.1)
Sodium: 135 mmol/L (ref 135–145)

## 2021-11-02 LAB — CBC
HCT: 34.4 % — ABNORMAL LOW (ref 39.0–52.0)
Hemoglobin: 11.7 g/dL — ABNORMAL LOW (ref 13.0–17.0)
MCH: 28.3 pg (ref 26.0–34.0)
MCHC: 34 g/dL (ref 30.0–36.0)
MCV: 83.1 fL (ref 80.0–100.0)
Platelets: 209 10*3/uL (ref 150–400)
RBC: 4.14 MIL/uL — ABNORMAL LOW (ref 4.22–5.81)
RDW: 13.4 % (ref 11.5–15.5)
WBC: 17.9 10*3/uL — ABNORMAL HIGH (ref 4.0–10.5)
nRBC: 0 % (ref 0.0–0.2)

## 2021-11-02 LAB — PROCALCITONIN: Procalcitonin: 0.88 ng/mL

## 2021-11-02 LAB — MAGNESIUM: Magnesium: 2.3 mg/dL (ref 1.7–2.4)

## 2021-11-02 LAB — LACTIC ACID, PLASMA: Lactic Acid, Venous: 2.5 mmol/L (ref 0.5–1.9)

## 2021-11-02 LAB — PHOSPHORUS: Phosphorus: 4.4 mg/dL (ref 2.5–4.6)

## 2021-11-02 SURGERY — CRANIOTOMY HEMATOMA EVACUATION SUBDURAL
Anesthesia: General | Laterality: Right

## 2021-11-02 MED ORDER — VASOPRESSIN 20 UNIT/ML IV SOLN
INTRAVENOUS | Status: DC | PRN
  Administered 2021-11-02: 2 [IU] via INTRAVENOUS
  Administered 2021-11-02: 4 [IU] via INTRAVENOUS
  Administered 2021-11-02: 2 [IU] via INTRAVENOUS
  Administered 2021-11-02: 4 [IU] via INTRAVENOUS
  Administered 2021-11-02: 2 [IU] via INTRAVENOUS

## 2021-11-02 MED ORDER — ROCURONIUM BROMIDE 100 MG/10ML IV SOLN
INTRAVENOUS | Status: DC | PRN
  Administered 2021-11-02 (×2): 50 mg via INTRAVENOUS
  Administered 2021-11-02: 30 mg via INTRAVENOUS

## 2021-11-02 MED ORDER — SURGIFLO WITH THROMBIN (HEMOSTATIC MATRIX KIT) OPTIME
TOPICAL | Status: DC | PRN
Start: 2021-11-02 — End: 2021-11-02
  Administered 2021-11-02: 1 via TOPICAL

## 2021-11-02 MED ORDER — MIDAZOLAM HCL 2 MG/2ML IJ SOLN
INTRAMUSCULAR | Status: DC | PRN
  Administered 2021-11-02 (×2): 1 mg via INTRAVENOUS

## 2021-11-02 MED ORDER — LIDOCAINE-EPINEPHRINE (PF) 1 %-1:200000 IJ SOLN
INTRAMUSCULAR | Status: AC
  Filled 2021-11-02: qty 30

## 2021-11-02 MED ORDER — PHENYLEPHRINE HCL (PRESSORS) 10 MG/ML IV SOLN
INTRAVENOUS | Status: AC
  Filled 2021-11-02: qty 1

## 2021-11-02 MED ORDER — EPHEDRINE 5 MG/ML INJ
INTRAVENOUS | Status: AC
  Filled 2021-11-02: qty 5

## 2021-11-02 MED ORDER — POVIDONE-IODINE 10 % EX SOLN
CUTANEOUS | Status: DC | PRN
  Administered 2021-11-02: 1 via TOPICAL

## 2021-11-02 MED ORDER — SODIUM CHLORIDE 0.9 % IV SOLN: INTRAVENOUS | Status: DC | PRN

## 2021-11-02 MED ORDER — ONDANSETRON HCL 4 MG/2ML IJ SOLN
INTRAMUSCULAR | Status: AC
  Filled 2021-11-02: qty 2

## 2021-11-02 MED ORDER — THROMBIN 5000 UNITS EX SOLR
CUTANEOUS | Status: AC
Start: 2021-11-02 — End: ?
  Filled 2021-11-02: qty 5000

## 2021-11-02 MED ORDER — MIDAZOLAM HCL 2 MG/2ML IJ SOLN
INTRAMUSCULAR | Status: AC
  Filled 2021-11-02: qty 2

## 2021-11-02 MED ORDER — VANCOMYCIN HCL 750 MG/150ML IV SOLN
750.0000 mg | INTRAVENOUS | Status: DC
  Filled 2021-11-02: qty 150

## 2021-11-02 MED ORDER — ONDANSETRON HCL 4 MG/2ML IJ SOLN
INTRAMUSCULAR | Status: DC | PRN
  Administered 2021-11-02: 4 mg via INTRAVENOUS

## 2021-11-02 MED ORDER — THROMBIN 5000 UNITS EX SOLR
CUTANEOUS | Status: DC | PRN
  Administered 2021-11-02: 5000 [IU] via TOPICAL

## 2021-11-02 MED ORDER — EPHEDRINE SULFATE (PRESSORS) 50 MG/ML IJ SOLN
INTRAMUSCULAR | Status: DC | PRN
  Administered 2021-11-02 (×2): 10 mg via INTRAVENOUS
  Administered 2021-11-02: 5 mg via INTRAVENOUS

## 2021-11-02 MED ORDER — PHENYLEPHRINE HCL (PRESSORS) 10 MG/ML IV SOLN
INTRAVENOUS | Status: DC | PRN
  Administered 2021-11-02: 160 ug via INTRAVENOUS
  Administered 2021-11-02 (×2): 80 ug via INTRAVENOUS
  Administered 2021-11-02 (×4): 160 ug via INTRAVENOUS
  Administered 2021-11-02: 80 ug via INTRAVENOUS
  Administered 2021-11-02: 240 ug via INTRAVENOUS

## 2021-11-02 MED ORDER — ESMOLOL HCL 100 MG/10ML IV SOLN
INTRAVENOUS | Status: DC | PRN
  Administered 2021-11-02: 10 mg via INTRAVENOUS

## 2021-11-02 MED ORDER — GELATIN ABSORBABLE 100 EX MISC
CUTANEOUS | Status: DC | PRN
  Administered 2021-11-02: 1 via TOPICAL

## 2021-11-02 MED ORDER — FENTANYL CITRATE (PF) 100 MCG/2ML IJ SOLN
INTRAMUSCULAR | Status: DC | PRN
  Administered 2021-11-02 (×4): 25 ug via INTRAVENOUS

## 2021-11-02 MED ORDER — PHENYLEPHRINE HCL-NACL 20-0.9 MG/250ML-% IV SOLN
INTRAVENOUS | Status: DC | PRN
  Administered 2021-11-02: 100 ug/min via INTRAVENOUS

## 2021-11-02 MED ORDER — 0.9 % SODIUM CHLORIDE (POUR BTL) OPTIME
TOPICAL | Status: DC | PRN
  Administered 2021-11-02 (×3): 1000 mL

## 2021-11-02 MED ORDER — VASOPRESSIN 20 UNIT/ML IV SOLN
INTRAVENOUS | Status: AC
  Filled 2021-11-02: qty 1

## 2021-11-02 MED ORDER — LIDOCAINE-EPINEPHRINE 1 %-1:100000 IJ SOLN
INTRAMUSCULAR | Status: DC | PRN
  Administered 2021-11-02: 4 mL
  Administered 2021-11-02: 12 mL

## 2021-11-02 MED ORDER — PROPOFOL 10 MG/ML IV BOLUS
INTRAVENOUS | Status: AC
  Filled 2021-11-02: qty 20

## 2021-11-02 MED ORDER — LACTATED RINGERS IV BOLUS
1000.0000 mL | Freq: Once | INTRAVENOUS | Status: AC
  Administered 2021-11-02: 1000 mL via INTRAVENOUS

## 2021-11-02 MED ORDER — PHENYLEPHRINE 80 MCG/ML (10ML) SYRINGE FOR IV PUSH (FOR BLOOD PRESSURE SUPPORT)
PREFILLED_SYRINGE | INTRAVENOUS | Status: AC
Start: 2021-11-02 — End: ?
  Filled 2021-11-02: qty 10

## 2021-11-02 MED ORDER — LIDOCAINE-EPINEPHRINE 1 %-1:100000 IJ SOLN
INTRAMUSCULAR | Status: AC
  Filled 2021-11-02: qty 1

## 2021-11-02 MED ORDER — GELATIN ABSORBABLE 12-7 MM EX MISC
CUTANEOUS | Status: DC | PRN
  Administered 2021-11-02: 1 via TOPICAL

## 2021-11-02 MED ORDER — VASOPRESSIN 20 UNITS/100 ML INFUSION FOR SHOCK
0.0000 [IU]/min | INTRAVENOUS | Status: DC
  Filled 2021-11-02: qty 100

## 2021-11-02 MED ORDER — PHENYLEPHRINE HCL-NACL 20-0.9 MG/250ML-% IV SOLN
INTRAVENOUS | Status: AC
  Filled 2021-11-02: qty 250

## 2021-11-02 MED ORDER — FENTANYL CITRATE (PF) 100 MCG/2ML IJ SOLN
INTRAMUSCULAR | Status: AC
  Filled 2021-11-02: qty 2

## 2021-11-02 SURGICAL SUPPLY — 72 items
AGENT HMST KT MTR STRL THRMB (HEMOSTASIS) ×2
APL PRP STRL LF ISPRP CHG 10.5 (MISCELLANEOUS) ×4
APPLICATOR CHLORAPREP 10.5 ORG (MISCELLANEOUS) ×6 IMPLANT
BLADE CLIPPER SPEC (BLADE) ×3 IMPLANT
BUR NEURO DRILL SOFT 3.0X3.8M (BURR) ×3 IMPLANT
BUR SPIRAL ROUTER 2.3 (BUR) ×3 IMPLANT
CLIP RANEY DISP (INSTRUMENTS) ×3 IMPLANT
COUNTER NEEDLE 20/40 LG (NEEDLE) ×3 IMPLANT
DRAPE SURG 17X11 SM STRL (DRAPES) ×10 IMPLANT
DRAPE WARM FLUID 44X44 (DRAPES) ×3 IMPLANT
DRSG TEGADERM 4X4.75 (GAUZE/BANDAGES/DRESSINGS) ×4 IMPLANT
ELECT CAUTERY BLADE TIP 2.5 (TIP) ×6
ELECT REM PT RETURN 9FT ADLT (ELECTROSURGICAL) ×3
ELECTRODE CAUTERY BLDE TIP 2.5 (TIP) ×2 IMPLANT
ELECTRODE REM PT RTRN 9FT ADLT (ELECTROSURGICAL) ×2 IMPLANT
GAUZE 4X4 16PLY ~~LOC~~+RFID DBL (SPONGE) ×7 IMPLANT
GAUZE SPONGE 4X4 12PLY STRL (GAUZE/BANDAGES/DRESSINGS) ×10 IMPLANT
GLOVE BIOGEL PI IND STRL 6.5 (GLOVE) ×4 IMPLANT
GLOVE BIOGEL PI IND STRL 8 (GLOVE) ×2 IMPLANT
GLOVE BIOGEL PI INDICATOR 6.5 (GLOVE) ×2
GLOVE BIOGEL PI INDICATOR 8 (GLOVE) ×1
GLOVE SURG SYN 6.5 ES PF (GLOVE) ×6 IMPLANT
GLOVE SURG SYN 6.5 PF PI (GLOVE) ×4 IMPLANT
GLOVE SURG SYN 8.0 (GLOVE) ×6 IMPLANT
GLOVE SURG SYN 8.0 PF PI (GLOVE) ×4 IMPLANT
GOWN SRG LRG LVL 4 IMPRV REINF (GOWNS) ×4 IMPLANT
GOWN STRL REIN LRG LVL4 (GOWNS) ×6
GOWN STRL REUS W/ TWL XL LVL3 (GOWN DISPOSABLE) ×4 IMPLANT
GOWN STRL REUS W/TWL XL LVL3 (GOWN DISPOSABLE) ×6
GRADUATE 1200CC STRL 31836 (MISCELLANEOUS) ×3 IMPLANT
GRAFT DURAGEN MATRIX 5WX7L (Graft) ×1 IMPLANT
HEMOSTAT SURGICEL 2X14 (HEMOSTASIS) ×3 IMPLANT
KIT TURNOVER KIT A (KITS) ×3 IMPLANT
MANIFOLD NEPTUNE II (INSTRUMENTS) ×4 IMPLANT
MARKER SKIN DUAL TIP RULER LAB (MISCELLANEOUS) ×6 IMPLANT
NS IRRIG 1000ML POUR BTL (IV SOLUTION) ×4 IMPLANT
PACK CRANIOTOMY CUSTOM (CUSTOM PROCEDURE TRAY) ×3 IMPLANT
PAD ARMBOARD 7.5X6 YLW CONV (MISCELLANEOUS) ×6 IMPLANT
PENCIL ELECTRO HAND CTR (MISCELLANEOUS) ×1 IMPLANT
PERFORATOR LRG  14-11MM (BIT) ×1
PERFORATOR LRG 14-11MM (BIT) IMPLANT
PLATE 1.5  2HOLE LNG NEURO (Plate) ×2 IMPLANT
PLATE 1.5 2HOLE LNG NEURO (Plate) IMPLANT
PLATE 1.5/0.5 13MM BURR HOLE (Plate) ×1 IMPLANT
PLATE 1.5/0.5 18.5MM BURR HOLE (Plate) ×1 IMPLANT
SCREW SELF DRILL HT 1.5/4MM (Screw) ×10 IMPLANT
SHEARS HARMONIC 9CM CVD (BLADE) ×1 IMPLANT
SHEET NEURO XL SOL CTL (MISCELLANEOUS) ×3 IMPLANT
SPONGE DRAIN TRACH 4X4 STRL 2S (GAUZE/BANDAGES/DRESSINGS) ×1 IMPLANT
SPONGE KITTNER 5P (MISCELLANEOUS) ×1 IMPLANT
SPONGE SURGIFOAM ABS GEL 12-7 (HEMOSTASIS) ×1 IMPLANT
STAPLER SKIN PROX 35W (STAPLE) ×4 IMPLANT
SURGIFLO W/THROMBIN 8M KIT (HEMOSTASIS) ×3 IMPLANT
SUT ETHILON 3-0 FS-10 30 BLK (SUTURE) ×3
SUT NURALON 4 0 TR CR/8 (SUTURE) ×1 IMPLANT
SUT POLYSORB 2-0 5X18 GS-10 (SUTURE) ×10 IMPLANT
SUT SILK 2 0 (SUTURE) ×3
SUT SILK 2 0 SH (SUTURE) ×1 IMPLANT
SUT SILK 2-0 30XBRD TIE 12 (SUTURE) IMPLANT
SUT SILK 3 0 (SUTURE) ×6
SUT SILK 3-0 18XBRD TIE 12 (SUTURE) ×4 IMPLANT
SUT VIC AB 4-0 SH 27 (SUTURE) ×3
SUT VIC AB 4-0 SH 27XANBCTRL (SUTURE) IMPLANT
SUTURE EHLN 3-0 FS-10 30 BLK (SUTURE) IMPLANT
SYR 5ML LL (SYRINGE) ×1 IMPLANT
TOWEL OR 17X26 4PK STRL BLUE (TOWEL DISPOSABLE) ×12 IMPLANT
TUBE TRACH  6.0 CUFF FLEX (MISCELLANEOUS) ×1
TUBE TRACH 6.0 CUFF FLEX (MISCELLANEOUS) IMPLANT
TUBING ART PRESS 48 MALE/FEM (TUBING) ×3 IMPLANT
TUBING CONNECTING 10 (TUBING) ×1 IMPLANT
WATER STERILE IRR 1000ML POUR (IV SOLUTION) ×4 IMPLANT
WATER STERILE IRR 500ML POUR (IV SOLUTION) ×3 IMPLANT

## 2021-11-02 NOTE — Progress Notes (Signed)
Inpatient Diabetes Program Recommendations ? ?AACE/ADA: New Consensus Statement on Inpatient Glycemic Control  ? ?Target Ranges:  Prepandial:   less than 140 mg/dL ?     Peak postprandial:   less than 180 mg/dL (1-2 hours) ?     Critically ill patients:  140 - 180 mg/dL  ? ? Latest Reference Range & Units 11/02/21 00:03 11/02/21 01:23 11/02/21 03:12 11/02/21 07:33  ?Glucose-Capillary 70 - 99 mg/dL 401 (H) 027 (H) 253 (H) 100 (H)  ? ? Latest Reference Range & Units 11/01/21 19:44 11/01/21 20:35 11/01/21 21:23 11/01/21 22:32  ?Glucose-Capillary 70 - 99 mg/dL 664 (H) 403 (H) 474 (H) 115 (H)  ? ?Review of Glycemic Control ? ?Diabetes history: DM2 ?Outpatient Diabetes medications: Jardiance 10 mg daily, Metformin 1000 mg BID, Tradjenta 5 mg daily ?Current orders for Inpatient glycemic control: Levemir 17 units Q24H, Novolog 0-9 units Q4H ? ?Inpatient Diabetes Program Recommendations:   ? ?Insulin: Patient received Levemir 17 units at 23:30 last night. Current CBG 100 mg/dl at 2:59 am today. May want to consider decreasing Levemir to 10 units QHS.  ? ?NOTE: Noted consult for Diabetes Coordinator. Diabetes Coordinator is not on campus over the weekend but available by pager from 8am to 5pm for questions or concerns. Chart reviewed. Patient has DM2 and takes oral DM medications outpatient. Per H&P on 11/01/21, patient admitted after "fall off the toilet, apparently hit his head, and was unresponsive.Shortly after arrival to the ED he was intubated for airway protection due to GCS score of 3.  Patient remained unresponsive with fixed pupils, along with decorticate posturing noted on the right." Patient noted to have DKA and started on IV insulin which has been transitioned to SQ insulin. CBG 100 mg/dl today at 5:63 am.  May want to consider decreasing Levemir to 10 units QHS to decrease risk of hypoglycemia especially given poor prognosis. ? ?Thanks, ?Orlando Penner, RN, MSN, CDE ?Diabetes Coordinator ?Inpatient Diabetes  Program ?[phone] (Team Pager from 8am to 5pm) ? ? ? ? ?

## 2021-11-02 NOTE — Addendum Note (Signed)
Addendum  created 11/02/21 1648 by Tonny Bollman, MD  ? Child order released for a procedure order, Clinical Note Signed, Intraprocedure Blocks edited, LDA created via procedure documentation, Order Canceled from Note, SmartForm saved  ?  ?

## 2021-11-02 NOTE — Consult Note (Signed)
Neurosurgery-New Consultation Evaluation ?11/02/2021 ?Christopher [name] [id number] ? ?Identifying Statement: ?Christopher [name] is a 88 [place] [id number] with subdural hematoma ? ?Physician Requesting Consultation: ICU ? ?History of Present Illness: ?Christopher [name] is here after a fall and sustained a large subdural hematoma. He had DKA on admission also and his exam showed lack of brainstem function. He is intubated in ICU ? ?Past Medical History:  ?HTN ?DM ? ? ?Social History: ?Social History  ? ?Socioeconomic History  ? Marital status: Married  ?  Spouse name: Not on file  ? Number of children: Not on file  ? Years of education: Not on file  ? Highest education level: Not on file  ?Occupational History  ? Not on file  ?Tobacco Use  ? Smoking status: Not on file  ? Smokeless tobacco: Not on file  ?Substance and Sexual Activity  ? Alcohol use: Not on file  ? Drug use: Not on file  ? Sexual activity: Not on file  ?Other Topics Concern  ? Not on file  ?Social History Narrative  ? Not on file  ? ?Social Determinants of Health  ? ?Financial Resource Strain: Not on file  ?Food Insecurity: Not on file  ?Transportation Needs: Not on file  ?Physical Activity: Not on file  ?Stress: Not on file  ?Social Connections: Not on file  ?Intimate Partner Violence: Not on file  ? ? ?Family History: ?No family history on file. ? ?Review of Systems: ? ?Review of Systems - Unable to be obtained ? ?Physical Exam: ?BP (!) 141/66   Pulse 65   Temp 100.2 ?F (37.9 ?C)   Resp 14   Ht 5\' 2"  (1.575 m)   Wt 57.4 kg   SpO2 99%   BMI 23.15 kg/m?  Body mass index is 23.15 kg/m?Marland Kitchen. Body surface area is 1.58 meters squared. ?General appearance: Intubated, in bed supine ? ?Neurologic exam:  ?GCS: 5T ?Mental status: alertness: does not open eyes, not on sedation ?Speech: intubated ?Cranial nerves:  ?II: Pupils enlarged, not reactive but some surgical change ?III/IV/VI: + right corneal ?V/VII:+ cough ?Motor: flexion in lower extremity on  right, flexion on right arm ? ? ?Laboratory: ?Results for orders placed or performed during the hospital encounter of 11/01/21  ?Blood culture (routine x 2)  ? Specimen: BLOOD  ?Result Value Ref Range  ? Specimen Description BLOOD BLOOD LEFT HAND   ? Special Requests    ?  BOTTLES DRAWN AEROBIC AND ANAEROBIC Blood Culture results may not be optimal due to an inadequate volume of blood received in culture bottles  ? Culture    ?  NO GROWTH < 24 HOURS ?Performed at [facility], [address] [place], [place] [address] ?  ? Report Status PENDING   ?Resp Panel by RT-PCR (Flu A&B, Covid) Nasopharyngeal Swab  ? Specimen: Nasopharyngeal Swab; Nasopharyngeal(NP) swabs in vial transport medium  ?Result Value Ref Range  ? SARS Coronavirus 2 by RT PCR NEGATIVE NEGATIVE  ? Influenza A by PCR NEGATIVE NEGATIVE  ? Influenza B by PCR NEGATIVE NEGATIVE  ?MRSA Next Gen by PCR, Nasal  ? Specimen: Nasal Mucosa; Nasal Swab  ?Result Value Ref Range  ? MRSA by PCR Next Gen NOT DETECTED NOT DETECTED  ?Culture, blood (Routine X 2) w Reflex to ID Panel  ? Specimen: BLOOD  ?Result Value Ref Range  ? Specimen Description BLOOD BLOOD RIGHT HAND   ? Special Requests    ?  BOTTLES DRAWN AEROBIC AND ANAEROBIC  Blood Culture adequate volume  ? Culture    ?  NO GROWTH < 12 HOURS ?Performed at [facility], [address], [place], [place] [address] ?  ? Report Status PENDING   ?CBC  ?Result Value Ref Range  ? WBC 19.4 (H) 4.0 - 10.5 K/uL  ? RBC 5.00 4.22 - 5.81 MIL/uL  ? Hemoglobin 14.0 13.0 - 17.0 g/dL  ? HCT 43.9 39.0 - 52.0 %  ? MCV 87.8 80.0 - 100.0 fL  ? MCH 28.0 26.0 - 34.0 pg  ? MCHC 31.9 30.0 - 36.0 g/dL  ? RDW 13.3 11.5 - 15.5 %  ? Platelets 336 150 - 400 K/uL  ? nRBC 0.0 0.0 - 0.2 %  ?Comprehensive metabolic panel  ?Result Value Ref Range  ? Sodium 134 (L) 135 - 145 mmol/L  ? Potassium 4.2 3.5 - 5.1 mmol/L  ? Chloride 101 98 - 111 mmol/L  ? CO2 14 (L) 22 - 32 mmol/L  ? Glucose, Bld 442 (H) 70 - 99 mg/dL  ? BUN 31 (H)  8 - 23 mg/dL  ? Creatinine, Ser 1.10 0.61 - 1.24 mg/dL  ? Calcium 9.3 8.9 - 10.3 mg/dL  ? Total Protein 8.5 (H) 6.5 - 8.1 g/dL  ? Albumin 3.8 3.5 - 5.0 g/dL  ? AST 34 15 - 41 U/L  ? ALT 18 0 - 44 U/L  ? Alkaline Phosphatase 100 38 - 126 U/L  ? Total Bilirubin 0.5 0.3 - 1.2 mg/dL  ? GFR, Estimated >60 >60 mL/min  ? Anion gap 19 (H) 5 - 15  ?Lactic acid, plasma  ?Result Value Ref Range  ? Lactic Acid, Venous 8.6 (HH) 0.5 - 1.9 mmol/L  ?APTT  ?Result Value Ref Range  ? aPTT 32 24 - 36 seconds  ?Protime-INR  ?Result Value Ref Range  ? Prothrombin Time 14.8 11.4 - 15.2 seconds  ? INR 1.2 0.8 - 1.2  ?Urinalysis, Routine w reflex microscopic Nasopharyngeal Swab  ?Result Value Ref Range  ? Color, Urine YELLOW (A) YELLOW  ? APPearance HAZY (A) CLEAR  ? Specific Gravity, Urine 1.023 1.005 - 1.030  ? pH 5.0 5.0 - 8.0  ? Glucose, UA >=500 (A) NEGATIVE mg/dL  ? Hgb urine dipstick SMALL (A) NEGATIVE  ? Bilirubin Urine NEGATIVE NEGATIVE  ? Ketones, ur 20 (A) NEGATIVE mg/dL  ? Protein, ur 30 (A) NEGATIVE mg/dL  ? Nitrite NEGATIVE NEGATIVE  ? Leukocytes,Ua NEGATIVE NEGATIVE  ? RBC / HPF 0-5 0 - 5 RBC/hpf  ? WBC, UA 0-5 0 - 5 WBC/hpf  ? Bacteria, UA NONE SEEN NONE SEEN  ? Squamous Epithelial / LPF NONE SEEN 0 - 5  ? Mucus PRESENT   ? Hyaline Casts, UA PRESENT   ?Urine Drug Screen, Qualitative ([facility] only)  ?Result Value Ref Range  ? Tricyclic, Ur Screen NONE DETECTED NONE DETECTED  ? Amphetamines, Ur Screen NONE DETECTED NONE DETECTED  ? MDMA (Ecstasy)Ur Screen NONE DETECTED NONE DETECTED  ? Cocaine Metabolite,Ur [place] NONE DETECTED NONE DETECTED  ? Opiate, Ur Screen NONE DETECTED NONE DETECTED  ? Phencyclidine (PCP) Ur S NONE DETECTED NONE DETECTED  ? Cannabinoid 50 Ng, Ur [place] NONE DETECTED NONE DETECTED  ? Barbiturates, Ur Screen NONE DETECTED NONE DETECTED  ? Benzodiazepine, Ur Scrn NONE DETECTED NONE DETECTED  ? Methadone Scn, Ur NONE DETECTED NONE DETECTED  ?Blood gas, arterial  ?Result Value Ref Range  ? FIO2 50 %  ? Mode PRESSURE  REGULATED VOLUME CONTROL   ?  MECHVT 500 mL  ? PEEP 5 cm H20  ? pH, Arterial 7.36 7.35 - 7.45  ? pCO2 arterial 30 (L) 32 - 48 mmHg  ? pO2, Arterial 172 (H) 83 - 108 mmHg  ? Bicarbonate 16.9 (L) 20.0 - 28.0 mmol/L  ? Acid-base deficit 7.3 (H) 0.0 - 2.0 mmol/L  ? O2 Saturation 99.9 %  ? Patient temperature 37.0   ? Collection site RIGHT RADIAL   ? Allens test (pass/fail) PASS PASS  ? Mechanical Rate 18   ?Beta-hydroxybutyric acid  ?Result Value Ref Range  ? Beta-Hydroxybutyric Acid 1.78 (H) 0.05 - 0.27 mmol/L  ?Ethanol  ?Result Value Ref Range  ? Alcohol, Ethyl (B) <10 <10 mg/dL  ?Basic metabolic panel  ?Result Value Ref Range  ? Sodium 138 135 - 145 mmol/L  ? Potassium 4.4 3.5 - 5.1 mmol/L  ? Chloride 107 98 - 111 mmol/L  ? CO2 20 (L) 22 - 32 mmol/L  ? Glucose, Bld 244 (H) 70 - 99 mg/dL  ? BUN 29 (H) 8 - 23 mg/dL  ? Creatinine, Ser 0.97 0.61 - 1.24 mg/dL  ? Calcium 8.9 8.9 - 10.3 mg/dL  ? GFR, Estimated >60 >60 mL/min  ? Anion gap 11 5 - 15  ?Basic metabolic panel  ?Result Value Ref Range  ? Sodium 137 135 - 145 mmol/L  ? Potassium 3.8 3.5 - 5.1 mmol/L  ? Chloride 106 98 - 111 mmol/L  ? CO2 24 22 - 32 mmol/L  ? Glucose, Bld 123 (H) 70 - 99 mg/dL  ? BUN 23 8 - 23 mg/dL  ? Creatinine, Ser 0.87 0.61 - 1.24 mg/dL  ? Calcium 8.4 (L) 8.9 - 10.3 mg/dL  ? GFR, Estimated >60 >60 mL/min  ? Anion gap 7 5 - 15  ?Procalcitonin - Baseline  ?Result Value Ref Range  ? Procalcitonin 0.58 ng/mL  ?Procalcitonin  ?Result Value Ref Range  ? Procalcitonin 0.88 ng/mL  ?Glucose, capillary  ?Result Value Ref Range  ? Glucose-Capillary 317 (H) 70 - 99 mg/dL  ?Glucose, capillary  ?Result Value Ref Range  ? Glucose-Capillary 227 (H) 70 - 99 mg/dL  ?Lactic acid, plasma  ?Result Value Ref Range  ? Lactic Acid, Venous 3.6 (HH) 0.5 - 1.9 mmol/L  ?Glucose, capillary  ?Result Value Ref Range  ? Glucose-Capillary 160 (H) 70 - 99 mg/dL  ?Glucose, capillary  ?Result Value Ref Range  ? Glucose-Capillary 152 (H) 70 - 99 mg/dL  ?Lactic acid, plasma  ?Result  Value Ref Range  ? Lactic Acid, Venous 2.5 (HH) 0.5 - 1.9 mmol/L  ?Magnesium  ?Result Value Ref Range  ? Magnesium 1.5 (L) 1.7 - 2.4 mg/dL  ?Phosphorus  ?Result Value Ref Range  ? Phosphorus 1.5 (L) 2.5 - 4.6 mg

## 2021-11-02 NOTE — Consult Note (Signed)
..  Tag, Wurtz ?[id number] ?Feb 18, 1926 ?Erin Fulling, MD ? ?Reason for Consult: Tracheostomy tube placement ? ?HPI: 88 y.o. male with history of subdural hematoma going to emergent drainage by Neurosurgery.  Due to patient's age and condition and need for long term ventilation, was asked by ICU to perform tracheostomy tube placement following drainage of subdural hematoma. ? ?Allergies:  ?Allergies  ?Allergen Reactions  ? Beef-Derived Products   ? Hydrochlorothiazide   ? Penicillins   ? Pork-Derived Products   ? ? ?ROS: Review of systems normal other than 12 systems except per HPI. ? ?PMH: No past medical history on file. ? ?FH: No family history on file. ? ?SH:  ?Social History  ? ?Socioeconomic History  ? Marital status: Married  ?  Spouse name: Not on file  ? Number of children: Not on file  ? Years of education: Not on file  ? Highest education level: Not on file  ?Occupational History  ? Not on file  ?Tobacco Use  ? Smoking status: Not on file  ? Smokeless tobacco: Not on file  ?Substance and Sexual Activity  ? Alcohol use: Not on file  ? Drug use: Not on file  ? Sexual activity: Not on file  ?Other Topics Concern  ? Not on file  ?Social History Narrative  ? Not on file  ? ?Social Determinants of Health  ? ?Financial Resource Strain: Not on file  ?Food Insecurity: Not on file  ?Transportation Needs: Not on file  ?Physical Activity: Not on file  ?Stress: Not on file  ?Social Connections: Not on file  ?Intimate Partner Violence: Not on file  ? ? ?PSH: unknown due to unresponsive nature ? ?Physical  Exam:  ?GEN- thin male intubated and sedated ?NECK-  palpable thyroid cartilage and notch.  No masses or lesions ?OC/OP-  ETT in place and secure. 7.5 ?RESP- ventilatory support ? ?CT 4/28 and 4/29- right sided subdural hematoma, chronic sinusitis mostly left sided ? ?A/P: Subdural hematoma expanding with mass effect with need for long term ventilation ? ?Plan:  Will plan on performing tracheostomy following  Neurosurgery portion of their case.  Discussed procedure and risks and benefits with patient's son and consent obtained.   ? ? ?Dink Creps ?11/02/2021 ?11:41 AM ? ? ? ?

## 2021-11-02 NOTE — Addendum Note (Signed)
Addendum  created 11/02/21 1643 by Felicita Gage, MD  ? Clinical Note Signed  ?  ?

## 2021-11-02 NOTE — IPAL (Signed)
?  Interdisciplinary Goals of Care Family Meeting ? ? ?Date carried out: 11/02/2021 ? ?Location of the meeting: Bedside ? ?Member's involved: Physician and Family Member or next of kin ? ? ? ?Code status: Full DNR ? ?Disposition: Continue current acute care ? ? ? ? ? ?GOALS OF CARE DISCUSSION ? ?The Clinical status was relayed to family in detail-Son at bedside ? ?Updated and notified of patients medical condition- ?Patient remains unresponsive and will not open eyes to command.   ?Patient is having a weak cough and struggling to remove secretions.   ?Patient with increased WOB and using accessory muscles to breathe ?Explained to family course of therapy and the modalities  ? ?Patient with Progressive multiorgan failure with a very high probablity of a very minimal chance of meaningful recovery despite all aggressive and optimal medical therapy.  ? ? ?Family understands the situation. ? ?They have consented and agreed to DNR status ? ?Family are satisfied with Plan of action and management. All questions answered ? ?Additional CC time 25 mins ? ? ?Lucie Leather, M.D.  ?[facility]  ?Medical Director [facility] ?Medical Director [facility]  ? ? ?

## 2021-11-02 NOTE — Anesthesia Preprocedure Evaluation (Addendum)
Anesthesia Evaluation  ?Patient identified by MRN, date of birth, ID band ?Patient unresponsive ? ?General Assessment Comment:GCS 5- responsive to pain  ? ?Reviewed: ?Allergy & Precautions, NPO status , Patient's Chart, lab work & pertinent test results ? ?History of Anesthesia Complications ?Negative for: history of anesthetic complications ? ?Airway ?Mallampati: Intubated ? ? ? ? ? ? Dental ?no notable dental hx. ? ?  ?Pulmonary ?neg pulmonary ROS,  ?Acute resp failure d/t Subdural ?  ?Pulmonary exam normal ?breath sounds clear to auscultation ? ? ? ? ? ? Cardiovascular ?Exercise Tolerance: Good ?METS: 3 - Mets hypertension, Pt. on medications ?(-) CAD, (-) Past MI, (-) Cardiac Stents, (-) CABG, (-) CHF, (-) Orthopnea, (-) PND and (-) DOE Normal cardiovascular exam ?Rhythm:Regular Rate:Normal ? ?Prior to fall and subdural hematoma ?  ?Neuro/Psych ?Acute subdural hematoma with GCs%; Brain shift on CT scan ?negative psych ROS  ? GI/Hepatic ?negative GI ROS, Neg liver ROS,   ?Endo/Other  ?negative endocrine ROS ? Renal/GU ?negative Renal ROS  ?negative genitourinary ?  ?Musculoskeletal ?negative musculoskeletal ROS ?(+)  ? Abdominal ?  ?Peds ? Hematology ?negative hematology ROS ?(+)   ?Anesthesia Other Findings ? ? Reproductive/Obstetrics ?negative OB ROS ? ?  ? ? ? ? ? ? ? ? ? ? ? ? ? ?  ?  ? ? ? ? ? ? ? ? ?Anesthesia Physical ?Anesthesia Plan ? ?ASA: 5 and emergent ? ?Anesthesia Plan: General  ? ?Post-op Pain Management:   ? ?Induction: Inhalational ? ?PONV Risk Score and Plan: Ondansetron ? ?Airway Management Planned: Oral ETT ? ?Additional Equipment:  ? ?Intra-op Plan:  ? ?Post-operative Plan: Extubation in OR ? ?Informed Consent: I have reviewed the patients History and Physical, chart, labs and discussed the procedure including the risks, benefits and alternatives for the proposed anesthesia with the patient or authorized representative who has indicated his/her understanding  and acceptance.  ? ?Patient has DNR.  ?Discussed DNR with power of attorney. ?  ?Dental advisory given ? ?Plan Discussed with: CRNA and Surgeon ? ?Anesthesia Plan Comments: (Benefits and risk discussed with patient to include death, MI and CVA.  Pt wishes to proceed.)  ? ? ? ? ? ? ?Anesthesia Quick Evaluation ? ?

## 2021-11-02 NOTE — Transfer of Care (Signed)
Immediate Anesthesia Transfer of Care Note ? ?Patient: Christopher [name] ? ?Procedure(s) Performed: CRANIOTOMY HEMATOMA EVACUATION SUBDURAL (Right) ?TRACHEOSTOMY ? ?Patient Location: ICU ? ?Anesthesia Type:General ? ?Level of Consciousness: Patient remains intubated per anesthesia plan ? ?Airway & Oxygen Therapy: Patient remains intubated per anesthesia plan and Patient placed on Ventilator (see vital sign flow sheet for setting) ? ?Post-op Assessment: Report given to RN and Post -op Vital signs reviewed and stable ? ?Post vital signs: Reviewed and stable ? ?Last Vitals:  ?Vitals Value Taken Time  ?BP 105/68 11/02/21 1600  ?Temp    ?Pulse 84 11/02/21 1601  ?Resp 18 11/02/21 1601  ?SpO2 100 % 11/02/21 1601  ?Vitals shown include unvalidated device data. ? ?Last Pain:  ?Vitals:  ? 11/01/21 2223  ?TempSrc:   ?PainSc: Asleep  ?   ? ?  ? ?Complications: No notable events documented. ?

## 2021-11-02 NOTE — Progress Notes (Signed)
? ?NAME:  Christopher [name], MRN:  [id number], DOB:  [birth year], LOS: 1 ?ADMISSION DATE:  11/01/2021 ?Brief Pt Description / Synopsis:  ?88 y.o. Male admitted with Acute Metabolic Encephalopathy in setting of large subdural hematoma with associated mass effect with left midline shift.  Pt deemed not a surgical candidate by Neurosurgery.  Pt also found to have DKA. ?  ?History of Present Illness:  ?Christopher [name] is a 88 year old male with unknown past medical history who presents to [facility] ED on 11/01/2021 due to unresponsiveness. Pt is currently intubated and unable to provide history and no family is currently available, therefore history is obtained from chart review. ?  ?EMS reported the patient had a fall off the toilet, apparently hit his head, and was unresponsive.  EMS noted the patient to be posturing with them. ?  ?Shortly after arrival to the ED he was intubated for airway protection due to GCS score of 3.  Patient remained unresponsive with fixed pupils, along with decorticate posturing noted on the right. ?  ?ED Course: ?Initial Vital Signs: Temperature 92 Fahrenheit, pulse 95, respiratory rate 18, BP 158/103, SPO2 100% ?Significant Labs: Sodium 134, bicarb 14, glucose 442, BUN 31, creatinine 1.1, anion gap 19, high-sensitivity troponin 6, lactic acid 8.6, WBC 19.4, PT 14.8, INR 1.2, beta hydroxybutyric acid 1.78 ?Urinalysis negative for UTI ?Urine drug screen negative ?Imaging ?Chest X-ray>>FINDINGS: ?Interval placement of ET tube with tip positioned approximately 2.0 ?cm from the carina. Enteric tube coursing below the diaphragm. ?Cardiac and mediastinal contours are unchanged. Mild bibasilar ?atelectasis. Trace right pleural effusion. ?CT head without contrast>>IMPRESSION: ?1. Increased size of a now large (2 cm thick) acute right cerebral ?convexity subdural hemorrhage. Significant associated mass effect ?with 1.4 cm of leftward midline shift. Recommend urgent ?neurosurgical consultation. ?2. Rounding of the  left temporal horn, compatible with ventricular ?entrapment. ?3. 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 ?Medications Administered: 1 L normal saline bolus, cefepime, Flagyl, vancomycin, insulin drip ?  ?ED provider discussed with Dr. Lacinda Axon of Neurosurgery regarding CT head showing large subdural hematoma.  Neurosurgery deemed that patient was not a surgical candidate.  He was given IV fluids along with broad-spectrum antibiotics including IV cefepime, Flagyl, vancomycin as he met SIRS criteria.  He was also placed on IV insulin drip due to meeting DKA criteria. ?  ?PCCM is asked to admit the patient to ICU for further work-up and treatment. ?  ?  ?Micro Data:  ?4/28: SARS-CoV-2 and influenza PCR>>negative ?4/28: Blood culture x2>> ?  ?Antimicrobials:  ?Cefepime 4/28>> ?Vancomycin 4/28>> ?Flagyl 4/28 x 1 dose ?  ?Significant Hospital Events: ?Including procedures, antibiotic start and stop dates in addition to other pertinent events   ?4/28: Presented to ER unresponsive, fixed pupils, posturing.  Intubated for airway protection.  Found to have large subdural hematoma with mass effect and left shift.  Deemed not a surgical candidate by neurosurgery.  Recommended comfort care, however family wants all aggressive measures.  PCCM asked to admit ?4/29 remains encephalopathic, poor prognosis ?  ?Interim History / Subjective:  ?Poor neuro examination ?Concerning for vegetative state ?Off sedation ?Remains critically ill, remains on vent ? ? ? ? ? ?Objective   ?Blood pressure 128/61, pulse 66, temperature (!) 100.6 ?F (38.1 ?C), resp. rate 18, height _0  (1.575 m), weight 57.4 kg, SpO2 100 %. ?   ?Vent Mode: PRVC ?FiO2 (%):  [40 %-50 %] 40 % ?Set Rate:  [18 bmp] 18 bmp ?Vt Set:  [  500 mL] 500 mL ?PEEP:  [5 cmH20] 5 cmH20 ?Plateau Pressure:  [16 cmH20-18 cmH20] 18 cmH20  ? ?Intake/Output Summary (Last 24 hours) at 11/02/2021 0727 ?Last data filed at 11/02/2021  0600 ?Gross per 24 hour  ?Intake 4931.53 ml  ?Output 2000 ml  ?Net 2931.53 ml  ? ?Filed Weights  ? 11/01/21 1438 11/01/21 1700  ?Weight: 68 kg 57.4 kg  ? ? ?REVIEW OF SYSTEMS ? ?PATIENT IS UNABLE TO PROVIDE COMPLETE REVIEW OF SYSTEMS DUE TO SEVERE CRITICAL ILLNESS AND TOXIC METABOLIC ENCEPHALOPATHY ? ? ? ?PHYSICAL EXAMINATION: ? ?GENERAL:critically ill appearing, +resp distress ?EYES: Pupils equal, round, reactive to light.  No scleral icterus.  ?MOUTH: Moist mucosal membrane. INTUBATED ?NECK: Supple.  ?PULMONARY: +rhonchi, +wheezing ?CARDIOVASCULAR: S1 and S2.  No murmurs  ?GASTROINTESTINAL: Soft, nontender, -distended. Positive bowel sounds.  ?MUSCULOSKELETAL: No swelling, clubbing, or edema.  ?NEUROLOGIC: obtunded  positive cough/gag reflex, withdraws all extremities to pain, pupils fixed and unequal (right 5 mm, left 3 mm) ?SKIN:intact,warm,dry ? ? ? ? ? ? ?Labs/imaging that I havepersonally reviewed  ?(right click and "Reselect all SmartList Selections" daily)  ? ? ? ? ?ASSESSMENT AND PLAN ?SYNOPSIS ? ?Acute Metabolic Encephalopathy in setting of Large Subdural Hematoma with associated mass effect and left midline shift ?Sedation needs in setting of mechanical ventilation ? ? ? ?Severe ACUTE Hypoxic and Hypercapnic Respiratory Failure ?-continue Mechanical Ventilator support ?-continue Bronchodilator Therapy ?-Wean Fio2 and PEEP as tolerated ?-VAP/VENT bundle implementation ?-will NOT  perform SAT/SBT  ? ?Vent Mode: PRVC ?FiO2 (%):  [40 %-50 %] 40 % ?Set Rate:  [18 bmp] 18 bmp ?Vt Set:  [500 mL] 500 mL ?PEEP:  [5 cmH20] 5 cmH20 ?Plateau Pressure:  [16 cmH20-18 cmH20] 18 cmH20 ? ?CARDIAC ?ICU monitoring ? ? ?KIDNEYS ?-continue Foley Catheter-assess need ?-Avoid nephrotoxic agents ?-Follow urine output, BMP ?-Ensure adequate renal perfusion, optimize oxygenation ?-Renal dose medications ? ? ?Intake/Output Summary (Last 24 hours) at 11/02/2021 0727 ?Last data filed at 11/02/2021 0600 ?Gross per 24 hour  ?Intake  4931.53 ml  ?Output 2000 ml  ?Net 2931.53 ml  ? ? ?  Latest Ref Rng & Units 11/01/2021  ? 10:10 PM 11/01/2021  ?  6:00 PM 11/01/2021  ?  1:54 PM  ?BMP  ?Glucose 70 - 99 mg/dL 123   244   442    ?BUN 8 - 23 mg/dL _0 ?Creatinine 0.61 - 1.24 mg/dL 0.87   0.97   1.10    ?Sodium 135 - 145 mmol/L 137   138   134    ?Potassium 3.5 - 5.1 mmol/L 3.8   4.4   4.2    ?Chloride 98 - 111 mmol/L 106   107   101    ?CO2 22 - 32 mmol/L _1 ?Calcium 8.9 - 10.3 mg/dL 8.4   8.9   9.3    ? ? ? ?NEUROLOGY ?Acute toxic metabolic encephalopathy ? ? ?ENDO ?- ICU hypoglycemic\Hyperglycemia protocol ?-check FSBS per protocol ? ? ?GI ?GI PROPHYLAXIS as indicated ? ?NUTRITIONAL STATUS ?DIET-->TF's as tolerated ?Constipation protocol as indicated ? ? ?ELECTROLYTES ?-follow labs as needed ?-replace as needed ?-pharmacy consultation and following ? ? ? ? ?Best practice (right click and "Reselect all SmartList Selections" daily)  ?Diet:  start feeds ?VAP protocol (if indicated): Yes ?DVT prophylaxis: Contraindicated ?GI prophylaxis: PPI ?Glucose control:  SSI Yes ?Central venous access:  N/A ?Arterial line:  N/A ?Foley:  Yes, and it is still needed ?Mobility:  bed rest  ?Code Status:  FULL CODE ?Disposition: ICU ? ?Labs   ?CBC: ?Recent Labs  ?Lab 11/01/21 ?1354  ?WBC 19.4*  ?HGB 14.0  ?HCT 43.9  ?MCV 87.8  ?PLT 336  ? ? ?Basic Metabolic Panel: ?Recent Labs  ?Lab 11/01/21 ?1354 11/01/21 ?1800 11/01/21 ?2210 11/02/21 ?0641  ?NA 134* 138 137  --   ?K 4.2 4.4 3.8  --   ?CL 101 107 106  --   ?CO2 14* 20* 24  --   ?GLUCOSE 442* 244* 123*  --   ?BUN 31* 29* 23  --   ?CREATININE 1.10 0.97 0.87  --   ?CALCIUM 9.3 8.9 8.4*  --   ?MG  --   --  1.5* 2.3  ?PHOS  --   --  1.5* 4.4  ? ?GFR: ?Estimated Creatinine Clearance: 41 mL/min (by C-G formula based on SCr of 0.87 mg/dL). ?Recent Labs  ?Lab 11/01/21 ?1354 11/01/21 ?1800 11/01/21 ?2023 11/02/21 ?9509  ?PROCALCITON  --  0.58  --   --   ?WBC 19.4*  --   --   --   ?LATICACIDVEN 8.6*  --   3.6* 2.5*  ? ? ?Liver Function Tests: ?Recent Labs  ?Lab 11/01/21 ?1354  ?AST 34  ?ALT 18  ?ALKPHOS 100  ?BILITOT 0.5  ?PROT 8.5*  ?ALBUMIN 3.8  ? ?No results for input(s): LIPASE, AMYLASE in the last 168 hours. ?No re

## 2021-11-02 NOTE — Anesthesia Postprocedure Evaluation (Addendum)
Anesthesia Post Note ? ?Patient: Christopher [name] ? ?Procedure(s) Performed: CRANIOTOMY HEMATOMA EVACUATION SUBDURAL (Right) ?TRACHEOSTOMY ? ?Patient location during evaluation: ICU ?Anesthesia Type: General ?Level of consciousness: comatose and patient remains intubated per anesthesia plan ?Pain management: pain level controlled ?Vital Signs Assessment: post-procedure vital signs reviewed and stable ?Respiratory status: patient on ventilator - see flowsheet for VS and patient connected to T-piece oxygen ?Cardiovascular status: blood pressure returned to baseline and stable ?Postop Assessment: no apparent nausea or vomiting ?Anesthetic complications: no ?Comments: To ICU stable after crani and trach;  Pt ventilated and transferred to ICU vent.  Renew DNR ? ? ?No notable events documented. ? ? ?Last Vitals:  ?Vitals:  ? 11/02/21 1203 11/02/21 1600  ?BP: 136/62 105/68  ?Pulse: 73 88  ?Resp: 18 18  ?Temp: (!) 38.3 ?C   ?SpO2: 100% 100%  ?  ?Last Pain:  ?Vitals:  ? 11/01/21 2223  ?TempSrc:   ?PainSc: Asleep  ? ? ?  ?  ?  ?  ?  ?  ? ?Felicita Gage ? ? ? ? ?

## 2021-11-02 NOTE — Progress Notes (Signed)
Pharmacy Antibiotic Note ? ?Christopher [name] is a 88 y.o. male admitted on 11/01/2021 with sepsis.  Pharmacy has been consulted for vancomycin and cefepime dosing. ?-large subdural hematoma ? ? ?Plan: ?Scr improved 1.1>>0.83 ?Continue Cefepime 2 g IV Q12H for Crcl 42.82ml/min ?Will adjust from vancomycin 1000 mg IV Q36H to 750mg  IV q24h ?Est AUC: 453 ?Used: Scr 0.83, IBW, Vd 0.72 ?Cmin 11.7 ?Obtain vanc levels around 4th or 5th dose if continued ?Monitor renal function and adjust dose as clinically indicated ? ? ?Height: 5\' 2"  (157.5 cm) ?Weight: 57.4 kg (126 lb 8.7 oz) ?IBW/kg (Calculated) : 54.6 ? ?Temp (24hrs), Avg:98.1 ?F (36.7 ?C), Min:92 ?F (33.3 ?C), Max:100.9 ?F (38.3 ?C) ? ?Recent Labs  ?Lab 11/01/21 ?1354 11/01/21 ?1800 11/01/21 ?2023 11/01/21 ?2210 11/02/21 ?[id number] 11/02/21 ?0730  ?WBC 19.4*  --   --   --   --  17.9*  ?CREATININE 1.10 0.97  --  0.87 0.83  --   ?LATICACIDVEN 8.6*  --  3.6*  --  2.5*  --   ? ?  ?Estimated Creatinine Clearance: 42.9 mL/min (by C-G formula based on SCr of 0.83 mg/dL).   ? ?Allergies  ?Allergen Reactions  ? Beef-Derived Products   ? Hydrochlorothiazide   ? Penicillins   ? Pork-Derived Products   ? ? ?Antimicrobials this admission: ?4/28 metronidazole x 1 ?4/28 cefepime >>  ?4/28 vancomycin >>  ? ?Dose adjustments this admission: ?4/29 vanc 1000mg  q36h to 750 q24h ? ?Microbiology results: ?4/28 BCx: pending ?4/28 MRSA PCR: neg ? ?Thank you for allowing pharmacy to be a part of this patient?s care. ? ?Jaivion Kingsley A ?11/02/2021 12:14 PM ? ?

## 2021-11-02 NOTE — Anesthesia Procedure Notes (Signed)
Arterial Line Insertion ?Start/End4/29/2023 12:40 PM, 11/02/2021 12:43 PM ?Performed by: Tonny Bollman, MD, anesthesiologist ? Patient location: ICU. ?Preanesthetic checklist: patient identified, IV checked, site marked, risks and benefits discussed, surgical consent, monitors and equipment checked, pre-op evaluation and timeout performed ?Right, radial was placed ?Catheter size: 20 G ?Hand hygiene performed , maximum sterile barriers used  and Seldinger technique used ?Allen's test indicative of satisfactory collateral circulation ?Attempts: 1 ?Procedure performed without using ultrasound guided technique. ?Following insertion, dressing applied and Biopatch. ?Post procedure assessment: normal ? ?Patient tolerated the procedure well with no immediate complications. ? ? ?

## 2021-11-02 NOTE — Op Note (Addendum)
..  11/02/2021 ?3:32 PM ? ?Christopher [name], Christopher [name] ?[id number] ? ?Pre-Op Dx: Respiratory failure ? ?Post-Op Dx:  same ? ?Proc:  Tracheostomy ? ?Surg:  Jeannie Fend Lorne Winkels ? ?Anes:  GOT ? ?EBL:  <37ml ? ?Comp:  none ? ?Indications:  88 y.o. male with history of subdural hematoma s/p drainage by Neurosurgery with need for long term ventilatory support and requested for tracheostomy tube placement following Neurosurgery intervention to reduce operative visits. ? ?Findings:  Size 6 cuffed tracheostomy tube placed through tracheal rings 2 and 3 ? ?Procedure:  The patient was brought from the intensive care unit to the operating room and transferred to an operating table.  Anesthesia was administered per indwelling orotracheal tube.  After Neurosurgery performed their procedure, care of the patient was transferred to Otolaryngology for their portion of the procedure. ? ?Neck extension was achieved as possible anda shoulder roll was placed.  The lower neck was palpated with the findings as described above.  1% Xylocaine with 1:100,000 epinephrine, 4 cc's, was infiltrated into the surgical field for intraoperative hemostasis.  Several minutes were allowed for this to take effect. The patient was prepped in a sterile fashion with a surgical prep from the chin down to the upper chest.  Sterile draping was accomplished in the standard fashion. ? ?A  4 cm horizontal incision was made sharply a finger's breadth above the sternal notch, and extended through skin and subcutaneous fat.  Using cautery, the superficial layer of the deep cervical fascia was lysed.  Additional dissection revealed the strap muscles.  The midline raphe was divided in two layers and the muscles retracted laterally.  The pretracheal plane was visualized.  This was entered bluntly.  The thyroid isthmus was isolated and divided with the Harmonic scalpel.  The thyroid gland was retracted to either side.  The anterior face of the trachea was cleared.  In the  2nd 3rd  interspace, a transverse incision was made between cartilage rings into the tracheal lumen.  A 6 mm wide inferiorly based flap was generated and secured to the lower wound with a 4-0 vicryl suture.   A previously tested  # 6 Shiley cuffed tracheostomy tube was brought into the field.  With the endotracheal tube under direct visualization through the tracheostomy, it was gently backed up.  The tracheostomy tube was inserted into the tracheal lumen.  Hemostasis was observed. The cuff was inflated and observed to be intact and containing pressure. The inner cannula was placed and ventilation assumed per tracheostomy tube.  Good chest wall motion was observed, and CO2 was documented per anesthesia.  The trach tube was secured in the standard fashion with trach ties. A 2-0 Nylon suture was used to secure the trach tube to the skin on both sides.  Hemostasis was observed again.  When satisfactory ventilation was assured, the orotracheal tube was removed.  At this point the procedure was completed.  The patient was returned to anesthesia, awakened as possible, and transferred back to the intensive care unit in stable condition. ? ?Comment: 88 y.o. male with need for prolonged ventilation was the indication for today's procedure.  Anticipate a routine postoperative recovery including standard tracheal hygiene.  The sutures should be removed in 5 days.  When the patient no longer requires ventilator or pressure support, the cuff should be deflated.  Changing to an uncuffed tube and downsizing will be according to the clinical condition of the patient. ? ? ?Kimmerly Lora ? ?3:32 PM ?11/02/2021 ? ?

## 2021-11-03 ENCOUNTER — Encounter: Payer: Self-pay | Admitting: Neurosurgery

## 2021-11-03 ENCOUNTER — Inpatient Hospital Stay: Payer: Medicare HMO

## 2021-11-03 DIAGNOSIS — G939 Disorder of brain, unspecified: Secondary | ICD-10-CM | POA: Diagnosis not present

## 2021-11-03 LAB — BASIC METABOLIC PANEL
Anion gap: 5 (ref 5–15)
BUN: 18 mg/dL (ref 8–23)
CO2: 19 mmol/L — ABNORMAL LOW (ref 22–32)
Calcium: 7 mg/dL — ABNORMAL LOW (ref 8.9–10.3)
Chloride: 105 mmol/L (ref 98–111)
Creatinine, Ser: 0.94 mg/dL (ref 0.61–1.24)
GFR, Estimated: >60 mL/min (ref >60)
Glucose, Bld: 193 mg/dL — ABNORMAL HIGH (ref 70–99)
Potassium: 4.1 mmol/L (ref 3.5–5.1)
Sodium: 129 mmol/L — ABNORMAL LOW (ref 135–145)

## 2021-11-03 LAB — CBC
HCT: 24.2 % — ABNORMAL LOW (ref 39.0–52.0)
Hemoglobin: 8.2 g/dL — ABNORMAL LOW (ref 13.0–17.0)
MCH: 28.4 pg (ref 26.0–34.0)
MCHC: 33.9 g/dL (ref 30.0–36.0)
MCV: 83.7 fL (ref 80.0–100.0)
Platelets: 243 10*3/uL (ref 150–400)
RBC: 2.89 MIL/uL — ABNORMAL LOW (ref 4.22–5.81)
RDW: 13.4 % (ref 11.5–15.5)
WBC: 31.5 10*3/uL — ABNORMAL HIGH (ref 4.0–10.5)
nRBC: 0 % (ref 0.0–0.2)

## 2021-11-03 LAB — GLUCOSE, CAPILLARY
Glucose-Capillary: 101 mg/dL — ABNORMAL HIGH (ref 70–99)
Glucose-Capillary: 162 mg/dL — ABNORMAL HIGH (ref 70–99)
Glucose-Capillary: 169 mg/dL — ABNORMAL HIGH (ref 70–99)
Glucose-Capillary: 188 mg/dL — ABNORMAL HIGH (ref 70–99)
Glucose-Capillary: 203 mg/dL — ABNORMAL HIGH (ref 70–99)
Glucose-Capillary: 89 mg/dL (ref 70–99)
Glucose-Capillary: 99 mg/dL (ref 70–99)

## 2021-11-03 LAB — PROTIME-INR
INR: 1.3 — ABNORMAL HIGH (ref 0.8–1.2)
Prothrombin Time: 15.8 seconds — ABNORMAL HIGH (ref 11.4–15.2)

## 2021-11-03 LAB — PHOSPHORUS: Phosphorus: 2.8 mg/dL (ref 2.5–4.6)

## 2021-11-03 LAB — APTT: aPTT: 42 seconds — ABNORMAL HIGH (ref 24–36)

## 2021-11-03 LAB — PROCALCITONIN: Procalcitonin: 0.9 ng/mL

## 2021-11-03 LAB — MAGNESIUM: Magnesium: 1.7 mg/dL (ref 1.7–2.4)

## 2021-11-03 MED ORDER — ACETAMINOPHEN 650 MG RE SUPP
650.0000 mg | Freq: Four times a day (QID) | RECTAL | Status: DC | PRN
  Administered 2021-11-03 – 2021-11-04 (×3): 650 mg via RECTAL
  Filled 2021-11-03 (×3): qty 1

## 2021-11-03 NOTE — Progress Notes (Signed)
Decent night overall, not able to titrate levophed off, but have not had to go to 10 mcg's. Patient is moving more.  ?

## 2021-11-03 NOTE — IPAL (Signed)
?  Interdisciplinary Goals of Care Family Meeting ? ? ?Date carried out: 11/03/2021 ? ?Location of the meeting: Bedside ? ?Member's involved: Physician, Bedside Registered Nurse, and Family Member or next of kin ? ? ?Code status: Full DNR ? ?Disposition: Continue current acute care ? ? ? ?GOALS OF CARE DISCUSSION ? ?The Clinical status was relayed to family in detail- ? ?Updated and notified of patients medical condition- ?Patient able to follow commands on RT side ?Left sided plegia ?Needs VENT to survive ?Remains on pressors ? ?Patient is having a weak cough and struggling to remove secretions.   ?Patient with increased WOB and using accessory muscles to breathe ?Explained to family course of therapy and the modalities  ? ?Patient with Progressive multiorgan failure with a very high probablity of a very minimal chance of meaningful recovery despite all aggressive and optimal medical therapy.  ?REMAINS DNR STATUS ? ?Family understands the situation. ? ?Family are satisfied with Plan of action and management. All questions answered ? ?Additional CC time 25 mins ? ? ?Lucie Leather, M.D.  ?[facility]  ?Medical Director [facility] ?Medical Director [facility]  ? ? ?

## 2021-11-03 NOTE — Op Note (Signed)
Operative Note ?  ?SURGERY DATE:  11/03/2021 ?  ?PRE-OP DIAGNOSIS:  Subdural Hematoma ?  ?POST-OP DIAGNOSIS: Post-Op Diagnosis Codes: ?Subdural Hematoma ? ?Procedure(s) with comments: ?Right Side Craniotomy for Subdural Evacuation ?  ?SURGEON:  ?   Nathaniel Man, MD  ? ?  ?ANESTHESIA: General  ?  ?OPERATIVE FINDINGS:  ? ? Indication ?Mr Wolters was admitted to [facility] after suffering a fall. Evaluation of the brain revealed a large right sided subdural hematoma. His exam was very poor and I discussed with family that given his age and injury, surgery had very little chance of helping his overall outcome and had increased risks. After discussion with multiple family members, they requested surgery for evacuation and we proceeded to the OR.  ? ?Procedure ?After obtaining informed consent, the patient was taken to the Operating Room intubated. Vascular access was obtained. The patient was positioned supine on a standard OR table with appropriate padding of pressure points and head turned left.  The scalp was prepped and draped in a sterile fashion. A time out was performed. A truma incision on right side was planned.  Local anesthesia was instilled along the planned incision site. Antibiotics were given.  ? ?The scalp was opened sharply to the level of the skull and reflected anterior with the muscle. A craniotomy was made with the high speed drill and craniotome in a circular fashion from keyhole posterior 10 cm and down to middle fossa. The dura was exposed. There was a large clot visible and the dura was opened. It expressed out spontaneously and then we used irrigation and suction to remove large portions. We irrigated the brain and checked in all directions to ensure all visible clot was removed. There were two cortical vessels seen that were bleeding and these were coagulated. We then moved to closure. The brain appeared relaxed and pulsating. ?  ?The area was thoroughly irrigated. A drain was  placed over brain and exited posteriorly. A duragen was placed over this.  The bone flap was replaced with the Lorenz plating system.. The galea was closed with 2-0 interrupted Vicryl sutures. The skin above was closed with staples. The wound was dressed  ?  ?The patient was taken intubated to the ICU.  Following the procedure, I spoke with the patient?s family about the procedure and answered all questions.  ? ? ?ESTIMATED BLOOD LOSS:  ? 150 cc ?  ?SPECIMENS ?None ?  ?IMPLANT ?GRAFT DURAGEN MATRIX [id number] - [id number] ? ?Inventory Item: GRAFT DURAGEN MATRIX [id number] Serial no.:  Model/Cat no.: [id number]  ?Implant name: Einar Crow MATRIX [id number] - [id number] Laterality: Right Area: Subdural Space  ?Manufacturer: [employer] Date of Manufacture:    ?Action: Implanted Number Used: 1   ?Device Identifier:  Device Identifier Type:    ? ?SCREW SELF DRILL HT 1.5/4MM - [id number] ? ?Inventory Item: SCREW SELF DRILL HT 1.5/4MM Serial no.:  Model/Cat no.: [id number]  ?Implant name: SCREW SELF DRILL HT 1.5/4MM - [id number] Laterality: Right Area: Cranium  ?Manufacturer: Cheri Fowler MICROFIXATION Date of Manufacture:    ?Action: Implanted Number Used: 10   ?Device Identifier:  Device Identifier Type:    ? ?PLATE 1.5  2HOLE LNG NEURO - [id number] ? ?Inventory Item: PLATE 1.5  2HOLE LNG NEURO Serial no.:  Model/Cat no.: [id number]  ?Implant name: PLATE 1.5  2HOLE LNG NEURO - [id number] Laterality: Right Area: Cranium  ?Manufacturer: Cheri Fowler MICROFIXATION Date of Manufacture:    ?Action: Implanted Number Used: 2   ?Device  Identifier:  Device Identifier Type:    ? ?PLATE 0.1/7.7 93.9QZ Guss Bunde - [id number] ? ?Inventory Item: PLATE 6.2/2.6 33.3LK BURR HOLE Serial no.:  Model/Cat no.: [id number]  ?Implant name: PLATE 5.6/2.5 63.8LH Guss Bunde - [id number] Laterality: Right Area: Cranium  ?Manufacturer: Cheri Fowler MICROFIXATION Date of Manufacture:    ?Action: Implanted Number Used: 1   ?Device Identifier:  Device Identifier Type:    ? ?   ?  ?I performed the case in its entirety  ?  ?Lucy Chris, MD ?[phone] ? ?

## 2021-11-03 NOTE — Progress Notes (Addendum)
? ? ?  RE: [place] Family ? ? ?To Whom It May Concern, ? ?           This letter is to confirm Mr. Christopher [name] is currently with his father, [name], who is in the Intensive Care Unit At [facility] and he is the sole provider to his father. Patient was admitted on April 28th, 2023 and is in a very critical condition. The patient status and length of stay is TBD. ? ?If there are any questions please call [phone] ? ? ? ? ? ? ?Lucie Leather, M.D.  ?[facility]  ?Medical Director [facility] ?Medical Director [facility] Cardio-Pulmonary Department  ? ? ? ?

## 2021-11-03 NOTE — Progress Notes (Addendum)
? ?NAME:  Christopher [name], MRN:  [id number], DOB:  August 20, 1925, LOS: 2 ?ADMISSION DATE:  11/01/2021 ?Brief Pt Description / Synopsis:  ?88 y.o. Male admitted with Acute Metabolic Encephalopathy in setting of large subdural hematoma with associated mass effect with left midline shift.   ?EMS reported the patient had a fall off the toilet, apparently hit his head, and was unresponsive.  EMS noted the patient to be posturing with them. ? Shortly after arrival to the ED he was intubated for airway protection due to GCS score of 3.  Patient remained unresponsive with fixed pupils, along with decorticate posturing noted on the right. ?  ?Pt initially  deemed not a surgical candidate by Neurosurgery.  Pt also found to have DKA. ?  ? ?Imaging ?Chest X-ray>>FINDINGS: ?Interval placement of ET tube with tip positioned approximately 2.0 ?cm from the carina. Enteric tube coursing below the diaphragm. ?Cardiac and mediastinal contours are unchanged. Mild bibasilar ?atelectasis. Trace right pleural effusion. ?CT head without contrast>>IMPRESSION: ?1. Increased size of a now large (2 cm thick) acute right cerebral ?convexity subdural hemorrhage. Significant associated mass effect ?with 1.4 cm of leftward midline shift. Recommend urgent ?neurosurgical consultation. ?2. Rounding of the left temporal horn, compatible with ventricular ?entrapment. ?3. 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 ? ?  ?Micro Data:  ?4/28: SARS-CoV-2 and influenza PCR>>negative ?4/28: Blood culture x2>> ?  ?Antimicrobials:  ?Cefepime 4/28>> ?Vancomycin 4/28>> ?Flagyl 4/28 x 1 dose ?  ?Significant Hospital Events: ?Including procedures, antibiotic start and stop dates in addition to other pertinent events   ?4/28: Presented to ER unresponsive, fixed pupils, posturing.  Intubated for airway protection.  Found to have large subdural hematoma with mass effect and left shift.  Deemed not a surgical candidate by  neurosurgery.  Recommended comfort care, however family wants all aggressive measures.  PCCM asked to admit ?4/29 remains encephalopathic, poor prognosis ?4/29 s/p CRANIOTOMY AND s/p TRACH ?  ?Interim History / Subjective:  ?Poor neuro examination ?NEURO SURGERY CAME TO BEDSIDE ?FAMILY AGREED WITH CRANIOTOMY ?Findings Concerning for vegetative state ?Off sedation ?Remains critically ill, remains on vent ?S/p trach ? ?Vent Mode: PRVC ?FiO2 (%):  [40 %] 40 % ?Set Rate:  [18 bmp] 18 bmp ?Vt Set:  [500 mL] 500 mL ?PEEP:  [5 cmH20] 5 cmH20 ?Plateau Pressure:  [16 cmH20] 16 cmH20 ? ? ? ? ? ?Objective   ?Blood pressure 119/69, pulse (!) 110, temperature 99.7 ?F (37.6 ?C), resp. rate (!) 22, height 5\' 2"  (1.575 m), weight 57.4 kg, SpO2 100 %. ?   ?Vent Mode: PRVC ?FiO2 (%):  [40 %] 40 % ?Set Rate:  [18 bmp] 18 bmp ?Vt Set:  [500 mL] 500 mL ?PEEP:  [5 cmH20] 5 cmH20 ?Plateau Pressure:  [16 cmH20] 16 cmH20  ? ?Intake/Output Summary (Last 24 hours) at 11/03/2021 [id number] ?Last data filed at 11/03/2021 0514 ?Gross per 24 hour  ?Intake 3034.09 ml  ?Output 1690 ml  ?Net 1344.09 ml  ? ? ?Filed Weights  ? 11/01/21 1438 11/01/21 1700  ?Weight: 68 kg 57.4 kg  ? ? ?REVIEW OF SYSTEMS ? ?PATIENT IS UNABLE TO PROVIDE COMPLETE REVIEW OF SYSTEMS DUE TO SEVERE CRITICAL ILLNESS AND TOXIC METABOLIC ENCEPHALOPATHY ? ? ? ?PHYSICAL EXAMINATION: ? ?GENERAL:critically ill appearing, +resp distress ?HEAD WITH BANDAGES , JP DRAIN IN PLACE ?EYES: non-reactive   No scleral icterus.  ?MOUTH: Moist mucosal membrane. s/p trach  ?NECK: Supple.  ?PULMONARY: +rhonchi, ?CARDIOVASCULAR: S1 and S2.  No murmurs  ?  GASTROINTESTINAL: Soft, nontender, -distended. Positive bowel sounds.  ?MUSCULOSKELETAL: No swelling, clubbing, or edema.  ?NEUROLOGIC: obtunded, non-purposeful movements ?SKIN:intact,warm,dry ? ? ? ? ? ?Labs/imaging that I havepersonally reviewed  ?(right click and "Reselect all SmartList Selections" daily)  ? ? ? ? ?ASSESSMENT AND PLAN ?SYNOPSIS ? ?88 yo  [place] male with Acute Metabolic Encephalopathy in setting of Large Subdural Hematoma with associated mass effect and left midline shift ?Sedation needs in setting of mechanical ventilation leading to inability to protect airway with severe airway compromise leading to hypoxic resp failure ? ?Severe ACUTE Hypoxic and Hypercapnic Respiratory Failure ?-continue Mechanical Ventilator support ?-Wean Fio2 and PEEP as tolerated ?-VAP/VENT bundle implementation ?- Wean PEEP & FiO2 as tolerated, maintain SpO2 > 88% ?- Head of bed elevated 30 degrees, VAP protocol in place ?- Plateau pressures less than 30 cm H20  ?- Intermittent chest x-ray & ABG PRN ?- Ensure adequate pulmonary hygiene  ? ? ?Vent Mode: PRVC ?FiO2 (%):  [40 %] 40 % ?Set Rate:  [18 bmp] 18 bmp ?Vt Set:  [500 mL] 500 mL ?PEEP:  [5 cmH20] 5 cmH20 ?Plateau Pressure:  [16 cmH20] 16 cmH20 ? ?CARDIAC ?ICU monitoring ? ?KIDNEYS ?-continue Foley Catheter-assess need ?-Avoid nephrotoxic agents ?-Follow urine output, BMP ?-Ensure adequate renal perfusion, optimize oxygenation ?-Renal dose medications ? ? ?Intake/Output Summary (Last 24 hours) at 11/03/2021 1937 ?Last data filed at 11/03/2021 0514 ?Gross per 24 hour  ?Intake 3034.09 ml  ?Output 1690 ml  ?Net 1344.09 ml  ? ?NEUROLOGY ?Acute toxic metabolic encephalopathy ?Large subdural bleed s/p evacuation and craniotomy ?Follow up CT scans and NEURO SURG RECS ? ? ?ENDO ?- ICU hypoglycemic\Hyperglycemia protocol ?-check FSBS per protocol ? ? ?GI ?GI PROPHYLAXIS as indicated ? ?NUTRITIONAL STATUS ?DIET-->TF's as tolerated ?Constipation protocol as indicated ? ? ?ELECTROLYTES ?-follow labs as needed ?-replace as needed ?-pharmacy consultation and following ? ? ? ? ? ?Best practice (right click and "Reselect all SmartList Selections" daily)  ?Diet:  start feeds ?VAP protocol (if indicated): Yes ?DVT prophylaxis: Contraindicated ?GI prophylaxis: PPI ?Glucose control:  SSI Yes ?Central venous access:  N/A ?Arterial line:   N/A ?Foley:  Yes, and it is still needed ?Mobility:  bed rest  ?Code Status:  DNR status ?Disposition: ICU ? ?Labs   ?CBC: ?Recent Labs  ?Lab 11/01/21 ?1354 11/02/21 ?0730 11/03/21 ?0400  ?WBC 19.4* 17.9* 31.5*  ?HGB 14.0 11.7* 8.2*  ?HCT 43.9 34.4* 24.2*  ?MCV 87.8 83.1 83.7  ?PLT 336 209 243  ? ? ? ?Basic Metabolic Panel: ?Recent Labs  ?Lab 11/01/21 ?1354 11/01/21 ?1800 11/01/21 ?2210 11/02/21 ?0641 11/03/21 ?0400  ?NA 134* 138 137 135 129*  ?K 4.2 4.4 3.8 4.1 4.1  ?CL 101 107 106 103 105  ?CO2 14* 20* 24 21* 19*  ?GLUCOSE 442* 244* 123* 100* 193*  ?BUN 31* 29* 23 17 18   ?CREATININE 1.10 0.97 0.87 0.83 0.94  ?CALCIUM 9.3 8.9 8.4* 8.4* 7.0*  ?MG  --   --  1.5* 2.3 1.7  ?PHOS  --   --  1.5* 4.4 2.8  ? ? ?GFR: ?Estimated Creatinine Clearance: 37.9 mL/min (by C-G formula based on SCr of 0.94 mg/dL). ?Recent Labs  ?Lab 11/01/21 ?1354 11/01/21 ?1800 11/01/21 ?2023 11/02/21 ?11/04/21 11/02/21 ?0730 11/03/21 ?0400  ?PROCALCITON  --  0.58  --  0.88  --  0.90  ?WBC 19.4*  --   --   --  17.9* 31.5*  ?LATICACIDVEN 8.6*  --  3.6* 2.5*  --   --   ? ? ? ?  Liver Function Tests: ?Recent Labs  ?Lab 11/01/21 ?1354  ?AST 34  ?ALT 18  ?ALKPHOS 100  ?BILITOT 0.5  ?PROT 8.5*  ?ALBUMIN 3.8  ? ? ?No results for input(s): LIPASE, AMYLASE in the last 168 hours. ?No results for input(s): AMMONIA in the last 168 hours. ? ?ABG ?   ?Component Value Date/Time  ? PHART 7.36 11/01/2021 1354  ? PCO2ART 30 (L) 11/01/2021 1354  ? PO2ART 172 (H) 11/01/2021 1354  ? HCO3 16.9 (L) 11/01/2021 1354  ? ACIDBASEDEF 7.3 (H) 11/01/2021 1354  ? O2SAT 99.9 11/01/2021 1354  ? ?  ? ?Coagulation Profile: ?Recent Labs  ?Lab 11/01/21 ?1354  ?INR 1.2  ? ? ? ? ?CBG: ?Recent Labs  ?Lab 11/02/21 ?0733 11/02/21 ?1555 11/02/21 ?1933 11/03/21 ?1950 11/03/21 ?9326  ?GLUCAP 100* 139* 190* 203* 188*  ? ? ? ?Allergies ?Allergies  ?Allergen Reactions  ? Beef-Derived Products Other (See Comments)  ?  Religious beliefs ?  ? Hydrochlorothiazide   ? Penicillins   ? Pork-Derived Products  Other (See Comments)  ?  Religious beliefs  ?  ? ? ? ?PATIENT WITH VERY POOR PROGNOSIS ?I ANTICIPATE PROLONGED ICU LOS ? ? ? ?Critical Care Time devoted to patient care services described in this note is 5

## 2021-11-03 NOTE — Progress Notes (Signed)
..11/03/2021 ?11:27 AM ? ?[name], Roots ?[id number] ? ?Post-Op Day 1 ? ? ? Temp:  [97.9 ?F (36.6 ?C)-100.9 ?F (38.3 ?C)] 100 ?F (37.8 ?C) (04/30 1000) ?Pulse Rate:  [72-117] 117 (04/30 1000) ?Resp:  [18-26] 23 (04/30 1000) ?BP: (49-139)/(35-101) 128/71 (04/30 1000) ?SpO2:  [99 %-100 %] 100 % (04/30 1000) ?FiO2 (%):  [28 %-40 %] 28 % (04/30 0729) ?Weight:  [57.2 kg] 57.2 kg (04/30 0500),  ? ? ? ?Intake/Output Summary (Last 24 hours) at 11/03/2021 1127 ?Last data filed at 11/03/2021 0514 ?Gross per 24 hour  ?Intake 2668.53 ml  ?Output 1350 ml  ?Net 1318.53 ml  ? ? ?Results for orders placed or performed during the hospital encounter of 11/01/21 (from the past 24 hour(s))  ?Glucose, capillary     Status: Abnormal  ? Collection Time: 11/02/21  3:55 PM  ?Result Value Ref Range  ? Glucose-Capillary 139 (H) 70 - 99 mg/dL  ?Glucose, capillary     Status: Abnormal  ? Collection Time: 11/02/21  7:33 PM  ?Result Value Ref Range  ? Glucose-Capillary 190 (H) 70 - 99 mg/dL  ?Glucose, capillary     Status: Abnormal  ? Collection Time: 11/03/21 12:14 AM  ?Result Value Ref Range  ? Glucose-Capillary 203 (H) 70 - 99 mg/dL  ?Glucose, capillary     Status: Abnormal  ? Collection Time: 11/03/21  3:39 AM  ?Result Value Ref Range  ? Glucose-Capillary 188 (H) 70 - 99 mg/dL  ?CBC     Status: Abnormal  ? Collection Time: 11/03/21  4:00 AM  ?Result Value Ref Range  ? WBC 31.5 (H) 4.0 - 10.5 K/uL  ? RBC 2.89 (L) 4.22 - 5.81 MIL/uL  ? Hemoglobin 8.2 (L) 13.0 - 17.0 g/dL  ? HCT 24.2 (L) 39.0 - 52.0 %  ? MCV 83.7 80.0 - 100.0 fL  ? MCH 28.4 26.0 - 34.0 pg  ? MCHC 33.9 30.0 - 36.0 g/dL  ? RDW 13.4 11.5 - 15.5 %  ? Platelets 243 150 - 400 K/uL  ? nRBC 0.0 0.0 - 0.2 %  ?Procalcitonin     Status: None  ? Collection Time: 11/03/21  4:00 AM  ?Result Value Ref Range  ? Procalcitonin 0.90 ng/mL  ?Basic metabolic panel     Status: Abnormal  ? Collection Time: 11/03/21  4:00 AM  ?Result Value Ref Range  ? Sodium 129 (L) 135 - 145 mmol/L  ? Potassium 4.1 3.5 -  5.1 mmol/L  ? Chloride 105 98 - 111 mmol/L  ? CO2 19 (L) 22 - 32 mmol/L  ? Glucose, Bld 193 (H) 70 - 99 mg/dL  ? BUN 18 8 - 23 mg/dL  ? Creatinine, Ser 0.94 0.61 - 1.24 mg/dL  ? Calcium 7.0 (L) 8.9 - 10.3 mg/dL  ? GFR, Estimated >60 >60 mL/min  ? Anion gap 5 5 - 15  ?Magnesium     Status: None  ? Collection Time: 11/03/21  4:00 AM  ?Result Value Ref Range  ? Magnesium 1.7 1.7 - 2.4 mg/dL  ?Phosphorus     Status: None  ? Collection Time: 11/03/21  4:00 AM  ?Result Value Ref Range  ? Phosphorus 2.8 2.5 - 4.6 mg/dL  ?Glucose, capillary     Status: Abnormal  ? Collection Time: 11/03/21  7:56 AM  ?Result Value Ref Range  ? Glucose-Capillary 169 (H) 70 - 99 mg/dL  ?Glucose, capillary     Status: Abnormal  ? Collection Time: 11/03/21 11:10 AM  ?Result Value Ref  Range  ? Glucose-Capillary 162 (H) 70 - 99 mg/dL  ? ? ?SUBJECTIVE:  no acute events.  Tolerating trach well on vent ? ?OBJECTIVE:  GEN- supine in NAD ?NECK- trach in place and secure, clear secretions.  Drainage removed and replaced ? ?IMPRESSION:  s/p Trach POD#1 ? ?PLAN:  Discussed trach with patient's son today and family yesterday.  Discussed that patients with tracheostomy tubes can still swallow if needed and that presence of tracheostomy tube does not prevent ability to take PO.  Discussed that tracheostomy is going to stay in longer than 48 hours and will need to be in place at least until patient does not require ventilatory support.  Discussed again anticipated drainage for several weeks.  OK to nursing to remove sutures on POD#5.  Please reconsult if any concerns. ? ?Christopher [name] ?11/03/2021, 11:27 AM ? ? ?

## 2021-11-03 NOTE — Progress Notes (Signed)
? ?  Progress Note  ? ?Date: 11/03/2021 ? ?HPI:Christopher [name] is here after a fall and sustained a large subdural hematoma. He had DKA on admission also and his exam showed lack of brainstem function. He is intubated in ICU ? ? ?24 Hour Events: ?4/30: Patient is on a ventilator with tracheostomy in place.  He is not on any sedation.  There have been reports of spontaneous movement and this is seen at bedside in his lower extremities.  The JP drain has had 50 cc of output. ? ? ?Vital Signs: ?Temp:  [97.9 ?F (36.6 ?C)-100.2 ?F (37.9 ?C)] 100 ?F (37.8 ?C) (04/30 1100) ?Pulse Rate:  [72-117] 108 (04/30 1100) ?Resp:  [18-26] 22 (04/30 1100) ?BP: (49-139)/(35-101) 119/72 (04/30 1100) ?SpO2:  [99 %-100 %] 100 % (04/30 1100) ?FiO2 (%):  [28 %-40 %] 28 % (04/30 0729) ?Weight:  [57.2 kg] 57.2 kg (04/30 0500) ?Temp (24hrs), Avg:99.2 ?F (37.3 ?C), Min:97.9 ?F (36.6 ?C), Max:100.2 ?F (37.9 ?C) ? ?Weight: 57.2 kg ? ? ?Problem List ?Patient Active Problem List  ? Diagnosis Date Noted  ? Brain damage 11/01/2021  ? ? ?Medications: ?Scheduled Meds: ? chlorhexidine gluconate (MEDLINE KIT)  15 mL Mouth Rinse BID  ? Chlorhexidine Gluconate Cloth  6 each Topical Q0600  ? docusate  100 mg Per Tube BID  ? insulin aspart  0-9 Units Subcutaneous Q4H  ? insulin detemir  0.3 Units/kg Subcutaneous Q24H  ? mouth rinse  15 mL Mouth Rinse 10 times per day  ? pantoprazole sodium  40 mg Per Tube Daily  ? polyethylene glycol  17 g Per Tube Daily  ? sodium chloride flush  3 mL Intravenous Q12H  ? ?Continuous Infusions: ? sodium chloride    ? sodium chloride 250 mL (11/01/21 1839)  ? famotidine (PEPCID) IV Stopped (11/03/21 2376)  ? norepinephrine (LEVOPHED) Adult infusion 6 mcg/min (11/03/21 1145)  ? ?PRN Meds:.sodium chloride, acetaminophen, dextrose, fentaNYL (SUBLIMAZE) injection, fentaNYL (SUBLIMAZE) injection, ondansetron (ZOFRAN) IV, sodium chloride flush ? ?Labs: ? ?Lab Results  ?Component Value Date  ? WBC 31.5 (H) 11/03/2021  ? WBC 17.9 (H)  11/02/2021  ? HCT 24.2 (L) 11/03/2021  ? HCT 34.4 (L) 11/02/2021  ? PLT 243 11/03/2021  ? PLT 209 11/02/2021  ?  ?Lab Results  ?Component Value Date  ? INR 1.2 11/01/2021  ? APTT 32 11/01/2021  ? ? ?Lab Results  ?Component Value Date  ? NA 129 (L) 11/03/2021  ? NA 135 11/02/2021  ? K 4.1 11/03/2021  ? K 4.1 11/02/2021  ? BUN 18 11/03/2021  ? BUN 17 11/02/2021  ?  ?Lab Results  ?Component Value Date  ? MG 1.7 11/03/2021  ? ?Exam: ?Patient on ventilator with tracheostomy ?GCS 6 ?Eyes do not open to painful stimulation ?He appears to localize in the right upper extremity with flicker in the left upper extremity he does withdrawal in bilateral lowers and shows some spontaneous movement ? ?Imaging: CT head pending ? ?Assessment/plan: ?Christopher. [name] is now status post right-sided craniotomy for subdural evacuation ? ?-Recommend continue JP drain today ?-CT head ordered for follow-up ?-Recommend continue control of blood pressure to systolic less than 283 ?-Hold DVT prophylaxis until postop day 2 ?-Continue antibiotics for drain ?-Recommend head of bed 30 degrees ?-We will continue to follow and will have better recommendation for prognosis as we give the patient more time to recover ? ?Deetta Perla, MD ? ? ? ?

## 2021-11-04 ENCOUNTER — Inpatient Hospital Stay: Payer: Medicare HMO

## 2021-11-04 ENCOUNTER — Encounter: Payer: Self-pay | Admitting: Neurosurgery

## 2021-11-04 DIAGNOSIS — Z515 Encounter for palliative care: Secondary | ICD-10-CM | POA: Diagnosis not present

## 2021-11-04 DIAGNOSIS — Z7189 Other specified counseling: Secondary | ICD-10-CM | POA: Diagnosis not present

## 2021-11-04 DIAGNOSIS — R4182 Altered mental status, unspecified: Secondary | ICD-10-CM

## 2021-11-04 DIAGNOSIS — S065XAA Traumatic subdural hemorrhage with loss of consciousness status unknown, initial encounter: Secondary | ICD-10-CM

## 2021-11-04 DIAGNOSIS — G939 Disorder of brain, unspecified: Secondary | ICD-10-CM | POA: Diagnosis not present

## 2021-11-04 LAB — BASIC METABOLIC PANEL
Anion gap: 8 (ref 5–15)
BUN: 15 mg/dL (ref 8–23)
CO2: 21 mmol/L — ABNORMAL LOW (ref 22–32)
Calcium: 7.5 mg/dL — ABNORMAL LOW (ref 8.9–10.3)
Chloride: 103 mmol/L (ref 98–111)
Creatinine, Ser: 0.78 mg/dL (ref 0.61–1.24)
GFR, Estimated: >60 mL/min (ref >60)
Glucose, Bld: 117 mg/dL — ABNORMAL HIGH (ref 70–99)
Potassium: 4 mmol/L (ref 3.5–5.1)
Sodium: 132 mmol/L — ABNORMAL LOW (ref 135–145)

## 2021-11-04 LAB — CBC
HCT: 21 % — ABNORMAL LOW (ref 39.0–52.0)
Hemoglobin: 7 g/dL — ABNORMAL LOW (ref 13.0–17.0)
MCH: 28.3 pg (ref 26.0–34.0)
MCHC: 33.3 g/dL (ref 30.0–36.0)
MCV: 85 fL (ref 80.0–100.0)
Platelets: 218 10*3/uL (ref 150–400)
RBC: 2.47 MIL/uL — ABNORMAL LOW (ref 4.22–5.81)
RDW: 13.5 % (ref 11.5–15.5)
WBC: 30.8 10*3/uL — ABNORMAL HIGH (ref 4.0–10.5)
nRBC: 0 % (ref 0.0–0.2)

## 2021-11-04 LAB — GLUCOSE, CAPILLARY
Glucose-Capillary: 134 mg/dL — ABNORMAL HIGH (ref 70–99)
Glucose-Capillary: 84 mg/dL (ref 70–99)
Glucose-Capillary: 72 mg/dL (ref 70–99)
Glucose-Capillary: 63 mg/dL — ABNORMAL LOW (ref 70–99)
Glucose-Capillary: 68 mg/dL — ABNORMAL LOW (ref 70–99)
Glucose-Capillary: 132 mg/dL — ABNORMAL HIGH (ref 70–99)
Glucose-Capillary: 134 mg/dL — ABNORMAL HIGH (ref 70–99)
Glucose-Capillary: 144 mg/dL — ABNORMAL HIGH (ref 70–99)

## 2021-11-04 LAB — PHOSPHORUS: Phosphorus: 2.3 mg/dL — ABNORMAL LOW (ref 2.5–4.6)

## 2021-11-04 LAB — MAGNESIUM: Magnesium: 1.9 mg/dL (ref 1.7–2.4)

## 2021-11-04 MED ORDER — DEXTROSE 50 % IV SOLN
12.5000 g | INTRAVENOUS | Status: AC
  Administered 2021-11-04: 12.5 g via INTRAVENOUS

## 2021-11-04 MED ORDER — VANCOMYCIN HCL IN DEXTROSE 1-5 GM/200ML-% IV SOLN
1000.0000 mg | INTRAVENOUS | Status: DC
  Administered 2021-11-05: 1000 mg via INTRAVENOUS
  Filled 2021-11-04: qty 200

## 2021-11-04 MED ORDER — VITAL AF 1.2 CAL PO LIQD
1000.0000 mL | ORAL | Status: DC
  Administered 2021-11-04 – 2021-11-05 (×2): 1000 mL

## 2021-11-04 MED ORDER — SODIUM CHLORIDE 0.9 % IV SOLN
2.0000 g | Freq: Two times a day (BID) | INTRAVENOUS | Status: DC
  Administered 2021-11-04 – 2021-11-06 (×5): 2 g via INTRAVENOUS
  Filled 2021-11-04: qty 2
  Filled 2021-11-04 (×2): qty 12.5
  Filled 2021-11-04: qty 2
  Filled 2021-11-04: qty 12.5

## 2021-11-04 MED ORDER — LIDOCAINE HCL 1 % IJ SOLN
5.0000 mL | INTRAMUSCULAR | Status: AC
  Administered 2021-11-04: 5 mL via INTRADERMAL
  Filled 2021-11-04: qty 10

## 2021-11-04 MED ORDER — FREE WATER
30.0000 mL | Status: DC
  Administered 2021-11-04 – 2021-11-14 (×59): 30 mL

## 2021-11-04 MED ORDER — VANCOMYCIN HCL 1500 MG/300ML IV SOLN
1500.0000 mg | Freq: Once | INTRAVENOUS | Status: AC
  Administered 2021-11-04: 1500 mg via INTRAVENOUS
  Filled 2021-11-04: qty 300

## 2021-11-04 NOTE — Progress Notes (Signed)
Initial Nutrition Assessment ? ?DOCUMENTATION CODES:  ? ?Not applicable ? ?INTERVENTION:  ? ?Once NGT confirmed in place: ? ?Vital 1.2@55ml /hr- Initiate at 39ml/hr and increase by 72ml/hr q 8 hours until goal rate is reached.  ? ?Free water flushes 94ml q4 hours to maintain tube patency  ? ?Regimen provides 1584kcal/day, 99g/day protein and 1261ml/day of free water.  ? ?Pt at high refeed risk; recommend monitor potassium, magnesium and phosphorus labs daily until stable ? ?NUTRITION DIAGNOSIS:  ? ?Inadequate oral intake related to acute illness as evidenced by NPO status. ? ?GOAL:  ? ?Provide needs based on ASPEN/SCCM guidelines ? ?MONITOR:  ? ?Vent status, Labs, Weight trends, Skin, TF tolerance, I & O's ? ?REASON FOR ASSESSMENT:  ? ?Ventilator ?  ? ?ASSESSMENT:  ? ?88 year old man with past history of type 2 diabetes, hypertension, hyperlipidemia, subdural hematoma secondary to falls with a recent discharge from the hospital on 10/25/2021 where he had right subdural acute/early subacute hematoma and sepsis related to UTI, was managed conservatively and discharged home on 10/25/2021. Pt now returns back on 11/01/2021 with unresponsiveness and was found to have large SDH with L midline shift now s/p craniotomy 4/30 and DKA. ? ?Pt s/p craniotomy 4/30 ?Pt s/p tracheostomy 4/29 ? ?Pt remains ventilated via trach. Plan today is for NGT placement and tube feeds. There is no documented weight history in chart to determine if any significant recent weight changes.  ? ?Medications reviewed and include: colace, insulin, protonix, miralax ? ?Labs reviewed: Na 132(L), K 4.0 wnl ?Wbc- 30.8(H), Hgb 7.0(L), Hct 21.0(L) ?Cbgs- 72, 84, 134 x 24 hrs ? ?Patient is currently intubated on ventilator support ?MV: 11.0 L/min ?Temp (24hrs), Avg:100.3 ?F (37.9 ?C), Min:99.9 ?F (37.7 ?C), Max:101.3 ?F (38.5 ?C) ? ?Propofol: none  ? ?MAP- >73mmHg  ? ?UOP-  ? ?NUTRITION - FOCUSED PHYSICAL EXAM: ? ?Flowsheet Row Most Recent Value  ?Orbital  Region Mild depletion  ?Upper Arm Region No depletion  ?Thoracic and Lumbar Region No depletion  ?Buccal Region No depletion  ?Temple Region No depletion  ?Clavicle Bone Region Mild depletion  ?Clavicle and Acromion Bone Region No depletion  ?Scapular Bone Region No depletion  ?Dorsal Hand No depletion  ?Patellar Region Mild depletion  ?Anterior Thigh Region Mild depletion  ?Posterior Calf Region Mild depletion  ?Edema (RD Assessment) None  ?Hair Reviewed  ?Eyes Reviewed  ?Mouth Reviewed  ?Skin Reviewed  ?Nails Reviewed  ? ?Diet Order:   ?Diet Order   ? ?       ?  Diet NPO time specified  Diet effective now       ?  ? ?  ?  ? ?  ? ?EDUCATION NEEDS:  ? ?No education needs have been identified at this time ? ?Skin:  Skin Assessment: Reviewed RN Assessment (incision head and neck) ? ?Last BM:  5/1- TYPE 6 ? ?Height:  ? ?Ht Readings from Last 1 Encounters:  ?11/01/21 5\' 2"  (1.575 m)  ? ? ?Weight:  ? ?Wt Readings from Last 1 Encounters:  ?11/04/21 61.7 kg  ? ? ?Ideal Body Weight:  53.6 kg ? ?BMI:  Body mass index is 24.88 kg/m?. ? ?Estimated Nutritional Needs:  ? ?Kcal:  1657kcal/day ? ?Protein:  90-105g/day ? ?Fluid:  1.4-1.6L/day ? ?01/04/22 MS, RD, LDN ?Please refer to AMION for RD and/or RD on-call/weekend/after hours pager ? ?

## 2021-11-04 NOTE — Progress Notes (Signed)
Eeg done 

## 2021-11-04 NOTE — Progress Notes (Incomplete)
? ?11/04/21 ?10:33 PM  ? ?Christopher [name] ?Aug 04, 1925 ?[id number] ? ?Referring provider:  ?Mady Haagensen, MD ?[address] ?[address] ?[place],  [place] [address] ?No chief complaint on file. ? ? ? ? ?HPI: ?Christopher [name] is a 88 y.o.male who presents today for further evaluation of OAB.  ? ?He has a personal history of CKD stage II.  ? ? ? ? ? ?PMH: ?Past Medical History:  ?Diagnosis Date  ? Arthritis   ? Ataxia   ? Diabetes mellitus without complication (HCC)   ? controlled, pt checks it in the morning   ? Dyslipidemia   ? Hypertension   ? somewhat controlled, pt checks every morning, he reports it has been good.  ? Stroke [facility])   ? TIA (transient ischemic attack)   ? ? ?Surgical History: ?Past Surgical History:  ?Procedure Laterality Date  ? none    ? ? ?Home Medications:  ?Allergies as of 11/05/2021   ? ?   Reactions  ? Hydrochlorothiazide   ? Unknown reaction  ? Beef-derived Products Other (See Comments)  ? religion  ? Penicillins Other (See Comments)  ? Reaction unknown.   ? Pork-derived Products Other (See Comments)  ? religion  ? ?  ? ?  ?Medication List  ?  ? ?  ? Accurate as of Nov 04, 2021 10:33 PM. If you have any questions, ask your nurse or doctor.  ?  ?  ? ?  ? ?amLODipine 10 MG tablet ?Commonly known as: NORVASC ?Take 1 tablet (10 mg total) by mouth daily. ?  ?donepezil 5 MG tablet ?Commonly known as: ARICEPT ?Take 5 mg by mouth daily. ?  ?Dupixent 300 MG/2ML prefilled syringe ?Generic drug: dupilumab ?Inject 300 mg into the skin every 14 (fourteen) days. Starting at day 15 for maintenance. ?  ?metFORMIN 1000 MG tablet ?Commonly known as: GLUCOPHAGE ?Take 1,000 mg by mouth 2 (two) times daily. ?  ?metoprolol succinate 100 MG 24 hr tablet ?Commonly known as: TOPROL-XL ?Take 1 tablet (100 mg total) by mouth daily. Take with or immediately following a meal. ?  ?Multi-Vitamins Tabs ?Take 1 tablet by mouth daily. ?  ?pravastatin 40 MG tablet ?Commonly known as: PRAVACHOL ?Take 1 tablet (40 mg total) by mouth daily. ?   ?sertraline 25 MG tablet ?Commonly known as: ZOLOFT ?Take 25 mg by mouth at bedtime. ?  ?Tradjenta 5 MG Tabs tablet ?Generic drug: linagliptin ?Take 5 mg by mouth daily. ?  ?triamcinolone cream 0.1 % ?Commonly known as: KENALOG ?Apply 1 application topically 2 (two) times daily as needed. ?  ? ?  ? ? ?Allergies:  ?Allergies  ?Allergen Reactions  ? Hydrochlorothiazide   ?  Unknown reaction  ? Beef-Derived Products Other (See Comments)  ?  religion  ? Penicillins Other (See Comments)  ?  Reaction unknown.   ? Pork-Derived Products Other (See Comments)  ?  religion  ? ? ?Family History: ?Family History  ?Problem Relation Age of Onset  ? CAD Neg Hx   ? Hypertension Neg Hx   ? ? ?Social History:  reports that he has quit smoking. His smoking use included cigarettes. He has never used smokeless tobacco. He reports that he does not drink alcohol and does not use drugs. ? ? ?Physical Exam: ?There were no vitals taken for this visit.  ?Constitutional:  Alert and oriented, No acute distress. ?HEENT: [place] AT, moist mucus membranes.  Trachea midline, no masses. ?Cardiovascular: No clubbing, cyanosis, or edema. ?Respiratory: Normal respiratory effort,  no increased work of breathing. ?Skin: No rashes, bruises or suspicious lesions. ?Neurologic: Grossly intact, no focal deficits, moving all 4 extremities. ?Psychiatric: Normal mood and affect. ? ?Laboratory Data: ? ?Lab Results  ?Component Value Date  ? CREATININE 0.92 10/25/2021  ? ?Lab Results  ?Component Value Date  ? HGBA1C 7.6 (H) 10/25/2021  ? ? ?Urinalysis ? ? ?Pertinent Imaging: ? ? ? ?Assessment & Plan:   ? ? ?No follow-ups on file. ? ?I,Kailey Littlejohn,acting as a scribe for Hollice Espy, MD.,have documented all relevant documentation on the behalf of Hollice Espy, MD,as directed by  Hollice Espy, MD while in the presence of Hollice Espy, MD. ? ? ?[facility] ?[address] ?[place], [place] [address] ?[phone] ? ?

## 2021-11-04 NOTE — Progress Notes (Signed)
Inpatient Diabetes Program Recommendations ? ?AACE/ADA: New Consensus Statement on Inpatient Glycemic Control  ? ?Target Ranges:  Prepandial:   less than 140 mg/dL ?     Peak postprandial:   less than 180 mg/dL (1-2 hours) ?     Critically ill patients:  140 - 180 mg/dL  ? ? Latest Reference Range & Units 11/03/21 07:56 11/03/21 11:10 11/03/21 16:02 11/03/21 19:53 11/03/21 23:37 11/04/21 03:25 11/04/21 07:20  ?Glucose-Capillary 70 - 99 mg/dL 474 (H) 259 (H) 99 89 563 (H) 134 (H) 84  ? ?Review of Glycemic Control ? ?Diabetes history: DM2 ?Outpatient Diabetes medications: Jardiance 10 mg daily, Metformin 1000 mg BID, Tradjenta 5 mg daily ?Current orders for Inpatient glycemic control: Levemir 17 units Q24H, Novolog 0-9 units Q4H ? ?Inpatient Diabetes Program Recommendations:   ? ?Insulin: Please consider decreasing Levemir to 14 units Q24H. ? ?Thanks, ?Orlando Penner, RN, MSN, CDE ?Diabetes Coordinator ?Inpatient Diabetes Program ?[phone] (Team Pager from 8am to 5pm) ? ? ? ?

## 2021-11-04 NOTE — Progress Notes (Signed)
? ?NAME:  [name], MRN:  [id number], DOB:  09-05-1925, LOS: 3 ?ADMISSION DATE:  11/01/2021, CONSULTATION DATE:  11/01/2021 ?REFERRING MD: Dr. Corky Downs, CHIEF COMPLAINT:  Unresponsive  ? ?Brief Pt Description / Synopsis:  ?88 y.o. Male admitted with Acute Metabolic Encephalopathy in setting of large subdural hematoma with associated mass effect with left midline shift.  Pt deemed not a surgical candidate by Neurosurgery.  Pt also found to have DKA. ? ?History of Present Illness:  ?Christopher [name] is a 88 year old male with unknown past medical history who presents to [facility] ED on 11/01/2021 due to unresponsiveness. Pt is currently intubated and unable to provide history and no family is currently available, therefore history is obtained from chart review. ? ?EMS reported the patient had a fall off the toilet, apparently hit his head, and was unresponsive.  EMS noted the patient to be posturing with them. ? ?Shortly after arrival to the ED he was intubated for airway protection due to GCS score of 3.  Patient remained unresponsive with fixed pupils, along with decorticate posturing noted on the right. ? ?ED Course: ?Initial Vital Signs: Temperature 92 Fahrenheit, pulse 95, respiratory rate 18, BP 158/103, SPO2 100% ?Significant Labs: Sodium 134, bicarb 14, glucose 442, BUN 31, creatinine 1.1, anion gap 19, high-sensitivity troponin 6, lactic acid 8.6, WBC 19.4, PT 14.8, INR 1.2, beta hydroxybutyric acid 1.78 ?Urinalysis negative for UTI ?Urine drug screen negative ?Imaging ?Chest X-ray>>FINDINGS: ?Interval placement of ET tube with tip positioned approximately 2.0 ?cm from the carina. Enteric tube coursing below the diaphragm. ?Cardiac and mediastinal contours are unchanged. Mild bibasilar ?atelectasis. Trace right pleural effusion. ?CT head without contrast>>IMPRESSION: ?1. Increased size of a now large (2 cm thick) acute right cerebral ?convexity subdural hemorrhage. Significant associated mass effect ?with 1.4 cm of  leftward midline shift. Recommend urgent ?neurosurgical consultation. ?2. Rounding of the left temporal horn, compatible with ventricular ?entrapment. ?3. 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 ?Medications Administered: 1 L normal saline bolus, cefepime, Flagyl, vancomycin, insulin drip ? ?ED provider discussed with Dr. Lacinda Axon of Neurosurgery regarding CT head showing large subdural hematoma.  Neurosurgery deemed that patient was not a surgical candidate.  He was given IV fluids along with broad-spectrum antibiotics including IV cefepime, Flagyl, vancomycin as he met SIRS criteria.  He was also placed on IV insulin drip due to meeting DKA criteria. ? ?PCCM is asked to admit the patient to ICU for further work-up and treatment. ? ?Pertinent  Medical History  ?No past medical history on file. ? ? ?Micro Data:  ?4/28: SARS-CoV-2 and influenza PCR>>negative ?4/28: Blood culture x2>> ?5/01: Lurline Idol cult>> ? ?Antimicrobials:  ?Cefepime 4/28>> ?Vancomycin 4/28>> ?Flagyl 4/28 x 1 dose ? ?Significant Hospital Events: ?Including procedures, antibiotic start and stop dates in addition to other pertinent events   ?4/28: Presented to ER unresponsive, fixed pupils, posturing.  Intubated for airway protection.  Found to have large subdural hematoma with mass effect and left shift.  Deemed not a surgical candidate by neurosurgery.  Recommended comfort care, however family wants all aggressive measures.  PCCM asked to admit ?4/29 remains encephalopathic, poor prognosis ?4/30 s/p CRANIOTOMY AND s/p TRACH ?5/01 CNS drain removed by neurosurg ? ?Interim History / Subjective:  ?-Patient examined in the ICU, currently on no sedatives ?-Unresponsive ?-Occ will move RLE ? ?Objective   ?Blood pressure 118/60, pulse (!) 108, temperature 99.9 ?F (37.7 ?C), temperature source Bladder, resp. rate (!) 22, height _0  (1.575 m), weight  61.7 kg, SpO2 100 %. ?   ?Vent Mode: PRVC ?FiO2 (%):   [28 %] 28 % ?Set Rate:  [18 bmp] 18 bmp ?Vt Set:  [500 mL] 500 mL ?PEEP:  [5 cmH20] 5 cmH20 ?Plateau Pressure:  [16 cmH20-17 cmH20] 17 cmH20  ? ?Intake/Output Summary (Last 24 hours) at 11/04/2021 0835 ?Last data filed at 11/04/2021 0800 ?Gross per 24 hour  ?Intake 339.07 ml  ?Output 935 ml  ?Net -595.93 ml  ? ?Filed Weights  ? 11/01/21 1700 11/03/21 0500 11/04/21 0357  ?Weight: 57.4 kg 57.2 kg 61.7 kg  ? ? ?Examination: ?General: Critically ill-appearing male, laying in bed, mechanically ventilated via trach. ?HEENT: S/P crani for SDH evac ?Lungs: Clear breath sounds bilaterally, and no wheezing or rales noted, occasionally with spontaneous breath above the vent, even, nonlabored ?Cardiovascular: Regular rate and rhythm, S1-S2, no murmurs, rubs, gallops ?Abdomen: Soft, nontender, nondistended, no guarding rebound tenderness, bowel sounds positive x4 ?Extremities: Normal bulk and tone, no deformities, no edema ?Neuro: On no sedatives, does have positive cough/gag reflex, withdraws all extremities to pain, pupils fixed and unequal (right 5 mm, left 3 mm) ?GU: Foley catheter in place ? ?Resolved Hospital Problem list   ?SDH evacuated 4/30 ? ?Assessment & Plan:  ? ?Acute Encephalopathy in setting of Large Subdural Hematoma with associated mass effect and left midline shift ?Intubated for airway protection ?-Avoid sedating medications as able ?-continue Mechanical Ventilator support ?-Wean Fio2 and PEEP as tolerated ?-VAP/VENT bundle implementation ?-Frequent Neuro checks ?-Goal SBP <140 ?-NO antiplatelets or anticoagulants ?-Implement measures to reduce ICP: HOB >30 degrees, prevent Hypoxia and Hypotension ?-Currently exhibiting no signs of seizures, will hold off on empiric AED's for now ?- Neurology consult, discussed with Dr. Rory Percy ?- Intermittent chest x-ray & ABG PRN ?- Ensure adequate pulmonary hygiene  ?- PRN bronchodilators ? ?DKA - resolved ?-Follow DKA protocol ?-Aggressive IV fluids ?-Insulin drip ?-Follow BMP  every 4 hours, once anion gap closed and serum bicarb greater than 20, can convert to long-acting insulin and sliding scale insulin ?-Consult diabetes coordinator, appreciate input ?-Follow ICU Hypo/Hyperglycemia protocol ? ?Meets SIRS Criteria at admission (T 92 F, Pulse 95, WBC 19.4, Lactic 8.6) ?Currently no source of infection identified ?-Monitor fever curve ?-Trend WBC's & Procalcitonin ?-Follow cultures as above ?-Continue empiric Cefepime and Vancomycin pending cultures &  sensitivities ?-Urinalysis negative for UTI, chest x-ray without evidence of pneumonia ?-Trach secretions increased, cultured ? ?Mild Hyponatremia, suspect hypertonic given DKA (corrected Na given glucose of 442 is Na 139) ?-Monitor I&O's / urinary output ?-Follow BMP ?-Ensure adequate renal perfusion ?-Avoid nephrotoxic agents as able ?-Replace electrolytes as indicated ?-IV fluids ? ? ? ? ?Best Practice (right click and "Reselect all SmartList Selections" daily)  ? ?Diet/type: NPO ?DVT prophylaxis: SCD ?GI prophylaxis: PPI ?Lines: N/A ?Foley:  N/A ?Code Status:  full code ?Last date of multidisciplinary goals of care discussion [11/04/21] ? ? ? ?Labs   ?CBC: ?Recent Labs  ?Lab 11/01/21 ?1354 11/02/21 ?0730 11/03/21 ?0400 11/04/21 ?0315  ?WBC 19.4* 17.9* 31.5* 30.8*  ?HGB 14.0 11.7* 8.2* 7.0*  ?HCT 43.9 34.4* 24.2* 21.0*  ?MCV 87.8 83.1 83.7 85.0  ?PLT 336 209 243 218  ? ? ? ?Basic Metabolic Panel: ?Recent Labs  ?Lab 11/01/21 ?1800 11/01/21 ?2210 11/02/21 ?0641 11/03/21 ?0400 11/04/21 ?0315  ?NA 138 137 135 129* 132*  ?K 4.4 3.8 4.1 4.1 4.0  ?CL 107 106 103 105 103  ?CO2 20* 24 21* 19* 21*  ?GLUCOSE 244* 123* 100* 193*  117*  ?BUN 29* _0 ?CREATININE 0.97 0.87 0.83 0.94 0.78  ?CALCIUM 8.9 8.4* 8.4* 7.0* 7.5*  ?MG  --  1.5* 2.3 1.7 1.9  ?PHOS  --  1.5* 4.4 2.8 2.3*  ? ? ?GFR: ?Estimated Creatinine Clearance: 44.6 mL/min (by C-G formula based on SCr of 0.78 mg/dL). ?Recent Labs  ?Lab 11/01/21 ?1354 11/01/21 ?1800 11/01/21 ?2023  11/02/21 ?0641 11/02/21 ?0730 11/03/21 ?0400 11/04/21 ?0315  ?PROCALCITON  --  0.58  --  0.88  --  0.90  --   ?WBC 19.4*  --   --   --  17.9* 31.5* 30.8*  ?LATICACIDVEN 8.6*  --  3.6* 2.5*  --   --   --   ? ? ? ?

## 2021-11-04 NOTE — Progress Notes (Signed)
Patient transported to CT and back to ICU room. No issues with transport. ?

## 2021-11-04 NOTE — Progress Notes (Signed)
? ?Progress Note  ? ?Date: 11/04/2021 ? ?HPI:Christopher [name] is here after a fall and sustained a large subdural hematoma. He had DKA on admission also and his exam showed lack of brainstem function. He is intubated in ICU ? ?11/04/21: RN at bedside states pt was saying his name last night for family and localizing. He remains off of sedation today with trach in place. JP drain clearing.  ?4/30: Patient is on a ventilator with tracheostomy in place.  He is not on any sedation.  There have been reports of spontaneous movement and this is seen at bedside in his lower extremities.  The JP drain has had 50 cc of output. ? ? ?Vital Signs: ?Temp:  [100 ?F (37.8 ?C)-101.3 ?F (38.5 ?C)] 100 ?F (37.8 ?C) (05/01 0600) ?Pulse Rate:  [94-118] 106 (05/01 0600) ?Resp:  [17-24] 21 (05/01 0600) ?BP: (100-132)/(62-74) 125/68 (05/01 0600) ?SpO2:  [99 %-100 %] 100 % (05/01 0600) ?FiO2 (%):  [28 %] 28 % (05/01 0230) ?Weight:  [61.7 kg] 61.7 kg (05/01 0357) ?Temp (24hrs), Avg:100.4 ?F (38 ?C), Min:100 ?F (37.8 ?C), Max:101.3 ?F (38.5 ?C) ? ?Weight: 61.7 kg ? ? ?Problem List ?Patient Active Problem List  ? Diagnosis Date Noted  ? Brain damage 11/01/2021  ? ? ?Medications: ?Scheduled Meds: ? chlorhexidine gluconate (MEDLINE KIT)  15 mL Mouth Rinse BID  ? Chlorhexidine Gluconate Cloth  6 each Topical Q0600  ? docusate  100 mg Per Tube BID  ? insulin aspart  0-9 Units Subcutaneous Q4H  ? insulin detemir  0.3 Units/kg Subcutaneous Q24H  ? mouth rinse  15 mL Mouth Rinse 10 times per day  ? pantoprazole sodium  40 mg Per Tube Daily  ? polyethylene glycol  17 g Per Tube Daily  ? sodium chloride flush  3 mL Intravenous Q12H  ? ?Continuous Infusions: ? sodium chloride    ? sodium chloride 10 mL/hr at 11/04/21 0600  ? famotidine (PEPCID) IV 20 mg (11/03/21 2140)  ? norepinephrine (LEVOPHED) Adult infusion Stopped (11/03/21 2300)  ? ?PRN Meds:.sodium chloride, acetaminophen, acetaminophen, dextrose, fentaNYL (SUBLIMAZE) injection, fentaNYL (SUBLIMAZE)  injection, ondansetron (ZOFRAN) IV, sodium chloride flush ? ?Labs: ? ?Lab Results  ?Component Value Date  ? WBC 30.8 (H) 11/04/2021  ? WBC 31.5 (H) 11/03/2021  ? HCT 21.0 (L) 11/04/2021  ? HCT 24.2 (L) 11/03/2021  ? PLT 218 11/04/2021  ? PLT 243 11/03/2021  ?  ?Lab Results  ?Component Value Date  ? INR 1.3 (H) 11/03/2021  ? APTT 42 (H) 11/03/2021  ? ? ?Lab Results  ?Component Value Date  ? NA 132 (L) 11/04/2021  ? NA 129 (L) 11/03/2021  ? K 4.0 11/04/2021  ? K 4.1 11/03/2021  ? BUN 15 11/04/2021  ? BUN 18 11/03/2021  ?  ?Lab Results  ?Component Value Date  ? MG 1.9 11/04/2021  ? ?Exam: ?Patient on ventilator with tracheostomy ?Opens eyes to pain. ?Does not withdraw to pain x 4 ?Does not regard.  ? ?Imaging: ?11/03/21 ?IMPRESSION: ?New postoperative changes of right subdural hematoma evacuation and ?drain placement. Decreased midline shift and trapping of the left ?lateral ventricle. ?  ?  ?Electronically Signed ?  By: Macy Mis M.D. ?  On: 11/03/2021 14:35 ? ?Assessment/plan: ?Christopher. Christopher [name] is now status post right-sided craniotomy for subdural evacuation ? ?- pts neuro exam this morning seems declined from previous. Ordered STAT repeat head CT. If stable will likely recommend EEG. ?-will likely remove JP drain this afternoon ?-Recommend continue control of  blood pressure to systolic less than 780 ?-Hold DVT prophylaxis until postop day 2 ?-Continue antibiotics for drain ?-Recommend head of bed 30 degrees ?-We will continue to follow and will have better recommendation for prognosis as we give the patient more time to recover ? ?Loleta Dicker, PA-C ? ? ? ?

## 2021-11-04 NOTE — Progress Notes (Signed)
Alycia Rossetti, PA gave order for lidocaine for suture placement.  ? ?

## 2021-11-04 NOTE — Procedures (Signed)
Patient Name: Christopher [name]  ?MRN: [id number]  ?Epilepsy Attending: Lora Havens  ?Referring Physician/Provider: Tyler Pita, MD ?Date: 11/04/2021 ?Duration: 24.55 mins ? ?Patient history: 88 year old man with a large right-sided subdural hematoma status postevacuation and tracheostomy, also presented with DKA which is now being treated along with worsening leukocytosis.  EEG to evaluate for seizure ? ?Level of alertness: lethargic  ? ?AEDs during EEG study: None ? ?Technical aspects: This EEG study was done with scalp electrodes positioned according to the 10-20 International system of electrode placement. Electrical activity was acquired at a sampling rate of 500Hz  and reviewed with a high frequency filter of 70Hz  and a low frequency filter of 1Hz . EEG data were recorded continuously and digitally stored.  ? ?Description: EEG showed continuous low amplitude 2 to 3 Hz delta slowing in right hemisphere and 5 to 6 Hz theta slowing in left hemisphere.  Hyperventilation and photic stimulation were not performed.    ? ?ABNORMALITY ?- Continuous slow, generalized and lateralized right hemisphere ? ?IMPRESSION: ?This study is suggestive of cortical dysfunction in right hemisphere likely secondary to underlying structural abnormality as well as moderate to severe diffuse encephalopathy, nonspecific etiology.  No seizures or epileptiform discharges were seen throughout the recording. ? ?Lora Havens  ? ?

## 2021-11-04 NOTE — Consult Note (Addendum)
? ?                                                                                ?Consultation Note ?Date: 11/04/2021  ? ?Patient Name: Christopher [name]  ?DOB: 12/26/1925  MRN: [id number]  Age / Sex: 88 y.o., male  ?PCP: Donnie Coffin, MD ?Referring Physician: Flora Lipps, MD ? ?Reason for Consultation: Establishing goals of care ? ?HPI/Patient Profile: 88 y.o. male  with medical history of vascular dementia, CVA x3, HTN, HLD, DM admitted on 11/01/2021 with fall and hitting head found unconscious by family and EMS called and found to have large subdural hematoma with significant left midline shift and DKA. He required intubation and noted to have fixed pupils and decorticate posturing. Overall prognosis very poor per neurosurgery and PCCM who have communicated this to family. Family opted to proceed with surgical intervention and tracheostomy was placed along with craniotomy 4/29. Worsened neurological exam noted 5/1 and stat head CT ordered but no signs of worsening but instead signs of slight improvement.  ? ?Clinical Assessment and Goals of Care: ?I met today at Christopher [name]'s bedside with his 2 sons and daughter and joined by Dr. Patsey Berthold. Records reviewed with noted history of vascular dementia and follows with neurology with history of 3 strokes. He was noted in neurology outpatient notes to have increasing difficulties with balance and confusion. Difficult to obtain clear history from family at bedside about baseline as they say he was fine. Also noted that he has appointment with urology tomorrow for increased urination and incontinence? Family at bedside were offered interpretor services but prefer [place] conversation. They tell me that their father speaks good [place] (first language Arabic per records).  ? ?Dr. Duwayne Heck explained to the family the significance of his brain injury on top of an already sick brain. We explained the surgery with goal to drain the blood and decrease the pressure in the brain.  Although there are slight improvements in the damage on CT scan it is early to know what kind of deficits will be left with. I clarified that there is a possibility that Christopher [name] does not recover (or may recover very little) from his current state of health. Sons express understanding and daughter is tearful. They tell us that neurosurgery told them that recovery will be slow and take a long time so they wish to give their father more time. We agreed to reassess our progress over the next 24-48 hours. They understand that it is important for [place] to see some signs of improvement to continue to move forward with hopes of improvement. They also agree that Christopher [name] would not want to be prolonged in his current state if he does not improve.  ? 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 His other son present today just flew in from [place] where he lives at his father's encouragement - they report that he loved [place]. Family also report that Christopher [name] worked at [employer] in [place] 1997-2007.  ? ?All questions/concerns addressed. Emotional support provided. I am still trying to coordinate family meeting with neurology.  ? ?Primary Decision Maker ?NEXT  OF KIN 9 children in total but 3 here locally at this time ?  ? ?SUMMARY OF RECOMMENDATIONS   ?- Ongoing conversations regarding expectations and clarifying goals of care moving forward ? ?Code Status/Advance Care Planning: ?DNR ? ? ?Symptom Management:  ?Per PCCM, neurosurgery, neurology.  ? ?Prognosis:  ?Overall prognosis poor with significant brain injury in setting of underlying stroke and dementia history.  ? ?Discharge Planning: To Be Determined  ? ?  ? ?Primary Diagnoses: ?Present on Admission: ? Brain damage ? ? ?I have reviewed the medical record, interviewed the patient and family, and examined the patient. The following aspects are pertinent. ? ?No past medical history on file. ?Social History   ? ?Socioeconomic History  ? Marital status: Married  ?  Spouse name: Not on file  ? Number of children: Not on file  ? Years of education: Not on file  ? Highest education level: Not on file  ?Occupational History  ? Not on file  ?Tobacco Use  ? Smoking status: Not on file  ? Smokeless tobacco: Not on file  ?Substance and Sexual Activity  ? Alcohol use: Not on file  ? Drug use: Not on file  ? Sexual activity: Not on file  ?Other Topics Concern  ? Not on file  ?Social History Narrative  ? Not on file  ? ?Social Determinants of Health  ? ?Financial Resource Strain: Not on file  ?Food Insecurity: Not on file  ?Transportation Needs: Not on file  ?Physical Activity: Not on file  ?Stress: Not on file  ?Social Connections: Not on file  ? ?No family history on file. ?Scheduled Meds: ? chlorhexidine gluconate (MEDLINE KIT)  15 mL Mouth Rinse BID  ? Chlorhexidine Gluconate Cloth  6 each Topical Q0600  ? docusate  100 mg Per Tube BID  ? insulin aspart  0-9 Units Subcutaneous Q4H  ? insulin detemir  0.3 Units/kg Subcutaneous Q24H  ? mouth rinse  15 mL Mouth Rinse 10 times per day  ? pantoprazole sodium  40 mg Per Tube Daily  ? polyethylene glycol  17 g Per Tube Daily  ? sodium chloride flush  3 mL Intravenous Q12H  ? ?Continuous Infusions: ? sodium chloride    ? sodium chloride 10 mL/hr at 11/04/21 0600  ? ?PRN Meds:.sodium chloride, acetaminophen, acetaminophen, dextrose, fentaNYL (SUBLIMAZE) injection, fentaNYL (SUBLIMAZE) injection, ondansetron (ZOFRAN) IV, sodium chloride flush ?Allergies  ?Allergen Reactions  ? Beef-Derived Products Other (See Comments)  ?  Religious beliefs ?  ? Hydrochlorothiazide   ? Penicillins   ? Pork-Derived Products Other (See Comments)  ?  Religious beliefs  ? ?Review of Systems  ?Unable to perform ROS: Acuity of condition  ? ?Physical Exam ?Vitals and nursing note reviewed.  ?Constitutional:   ?   General: He is not in acute distress. ?   Appearance: He is ill-appearing.  ?Cardiovascular:  ?    Rate and Rhythm: Tachycardia present.  ?Pulmonary:  ?   Effort: No tachypnea, accessory muscle usage or respiratory distress.  ?   Comments: ETT to vent support - tolerating ?Abdominal:  ?   Palpations: Abdomen is soft.  ?Neurological:  ?   Comments: Does not open eyes or follow commands. Movement of R extremities to noxious stimuli. No response/movement of L extremities. Pupils not equally reactive.   ? ? ?Vital Signs: BP 118/60 (BP Location: Left Arm)   Pulse (!) 108   Temp 99.9 ?F (37.7 ?C) (Bladder)   Resp (!) 22   Ht  _0  (1.575 m)   Wt 61.7 kg   SpO2 100%   BMI 24.88 kg/m?  ?Pain Scale: CPOT ?  ?Pain Score: Asleep ? ? ?SpO2: SpO2: 100 % ?O2 Device:SpO2: 100 % ?O2 Flow Rate: .  ? ?IO: Intake/output summary:  ?Intake/Output Summary (Last 24 hours) at 11/04/2021 0859 ?Last data filed at 11/04/2021 0800 ?Gross per 24 hour  ?Intake 339.07 ml  ?Output 935 ml  ?Net -595.93 ml  ? ? ?LBM: Last BM Date : 11/03/21 ?Baseline Weight: Weight: 68 kg ?Most recent weight: Weight: 61.7 kg     ?Palliative Assessment/Data: ? ? ? ? ?Time Total: 80 min ? ?Greater than 50%  of this time was spent counseling and coordinating care related to the above assessment and plan. ? ?Signed by: ?Vinie Sill, NP ?Palliative Medicine Team ?Pager # [phone] (M-F 8a-5p) ?Team Phone # [phone] (Nights/Weekends) ?  ? ? ? ? ? ? ? ? ? ? ? ? ?  ?

## 2021-11-04 NOTE — Progress Notes (Signed)
CBG was 63 therefore half amp of d50 given and CBG rechecked and is now 132. Tube feeds started at 1600.  ?

## 2021-11-04 NOTE — Consult Note (Addendum)
Neurology Consultation ? ?Reason for Consult: Neuro prognostication in the setting of large subdural hematoma ?Referring Physician: Dr. Patsey Berthold ? ?CC: Altered mental status ? ?History is obtained from: Chart review ? ?HPI: Christopher [name] is a 88 y.o. male  ?This is a 88 year old man with past history of type 2 diabetes, hypertension, hyperlipidemia, subdural hematoma secondary to falls with a recent discharge from the hospital on 10/25/2021 where he had right subdural acute/early subacute hematoma, sepsis related to UTI, was managed conservatively and discharged home on 10/25/2021. ?He returned back on 11/01/2021 with unresponsiveness.  According to the EMS report to EDP-patient had a fall off the toilet and apparently hit his head and was unresponsive.  He was a GCS 3 and intubated emergently.  Also noted to be in DKA.  He had a large right-sided subdural hematoma with midline shift and a poor neurological examination with bilateral nonreactive pupils.  Neurosurgery was consulted-they did not consider him a good surgical candidate but the family wanted to move ahead with surgical evacuation which was performed on 11/02/2021 by Dr. Lacinda Axon.  Patient remains off sedation with an extremely poor exam.  Neurological consultation obtained for prognostication. ? ? ?LKW: Prior to 11/01/2021 ?tpa given?: no, subdural hematoma ?Premorbid modified Rankin scale (mRS): Unable to ascertain ?ROS: Full ROS was performed and is negative except as noted in the HPI.  Unable to obtain due to altered mental status.  ? ?No past medical history on file.-On discharge ?Has another chart from which history has been obtained and past medical history as documented in the HPI above.  ? ?No family history on file. ? ? ?Social History:  ? has no history on file for tobacco use, alcohol use, and drug use. ? ?Medications ? ?Current Facility-Administered Medications:  ?  0.9 %  sodium chloride infusion, 250 mL, Intravenous, PRN, Mortimer Fries, Kurian, MD ?  0.9 %   sodium chloride infusion, 250 mL, Intravenous, Continuous, Darel Hong D, NP, Last Rate: 10 mL/hr at 11/04/21 0600, Infusion Verify at 11/04/21 0600 ?  acetaminophen (TYLENOL) suppository 650 mg, 650 mg, Rectal, Q6H PRN, Teressa Lower, NP, 650 mg at 11/03/21 1741 ?  acetaminophen (TYLENOL) tablet 650 mg, 650 mg, Per Tube, Q4H PRN, Rust-Chester, Britton L, NP, 650 mg at 11/02/21 6962 ?  chlorhexidine gluconate (MEDLINE KIT) (PERIDEX) 0.12 % solution 15 mL, 15 mL, Mouth Rinse, BID, Kasa, Kurian, MD, 15 mL at 11/04/21 0802 ?  Chlorhexidine Gluconate Cloth 2 % PADS 6 each, 6 each, Topical, Q0600, Flora Lipps, MD, 6 each at 11/04/21 0535 ?  dextrose 50 % solution 0-50 mL, 0-50 mL, Intravenous, PRN, Lavonia Drafts, MD ?  docusate (COLACE) 50 MG/5ML liquid 100 mg, 100 mg, Per Tube, BID, Rust-Chester, Britton L, NP, 100 mg at 11/02/21 1041 ?  fentaNYL (SUBLIMAZE) injection 25-100 mcg, 25-100 mcg, Intravenous, Q30 min PRN, Rust-Chester, Britton L, NP, 100 mcg at 11/02/21 0132 ?  insulin aspart (novoLOG) injection 0-9 Units, 0-9 Units, Subcutaneous, Q4H, Rust-Chester, Britton L, NP, 1 Units at 11/04/21 0338 ?  insulin detemir (LEVEMIR) injection 17 Units, 0.3 Units/kg, Subcutaneous, Q24H, Rust-Chester, Huel Cote, NP, 17 Units at 11/03/21 2355 ?  MEDLINE mouth rinse, 15 mL, Mouth Rinse, 10 times per day, Flora Lipps, MD, 15 mL at 11/04/21 0607 ?  ondansetron (ZOFRAN) injection 4 mg, 4 mg, Intravenous, Q6H PRN, Flora Lipps, MD ?  pantoprazole sodium (PROTONIX) 40 mg/20 mL oral suspension 40 mg, 40 mg, Per Tube, Daily, Rust-Chester, Britton L, NP, 40 mg at 11/02/21  1041 ?  polyethylene glycol (MIRALAX / GLYCOLAX) packet 17 g, 17 g, Per Tube, Daily, Rust-Chester, Britton L, NP, 17 g at 11/02/21 1041 ?  sodium chloride flush (NS) 0.9 % injection 3 mL, 3 mL, Intravenous, Q12H, Kasa, Kurian, MD, 3 mL at 11/03/21 2150 ?  sodium chloride flush (NS) 0.9 % injection 3 mL, 3 mL, Intravenous, PRN, Flora Lipps,  MD ? ?Exam: ?Current vital signs: ?BP 118/60 (BP Location: Left Arm)   Pulse (!) 108   Temp 99.9 ?F (37.7 ?C) (Bladder)   Resp (!) 22   Ht _0  (1.575 m)   Wt 61.7 kg   SpO2 100%   BMI 24.88 kg/m?  ?Vital signs in last 24 hours: ?Temp:  [99.9 ?F (37.7 ?C)-101.3 ?F (38.5 ?C)] 99.9 ?F (37.7 ?C) (05/01 0800) ?Pulse Rate:  [94-118] 108 (05/01 0800) ?Resp:  [17-25] 22 (05/01 0800) ?BP: (100-135)/(60-72) 118/60 (05/01 0800) ?SpO2:  [99 %-100 %] 100 % (05/01 0806) ?FiO2 (%):  [28 %] 28 % (05/01 0806) ?Weight:  [61.7 kg] 61.7 kg (05/01 0357) ? ?General: Tracheostomy in place, on no sedation ?HEENT: Right-sided postop bandages in place ?CVS: Regular rhythm ?Respiratory: On trach, vented, breathing over the vent ?Extremities normal perfused ?Neurological examination ?Not on sedation ?Trached and vented ?Breathing over the ventilator ?Does not follow any commands ?Nonverbal ?Cranial nerves: Both pupils are irregular with right pupil being 8 mm irregular and nonreactive and left pupil being 6 mm irregular and nonreactive.  Corneal reflexes are present.  He does not time breathe over the vent.  Does not blink to threat from either side, difficult to ascertain facial symmetry due to edentulousness. ?Motor examination: To noxious stimulation, no movement in the left upper extremity.  Minimal movement in the left lower extremity noxious stimulation.  Strong withdrawal to noxious stimulation in the right lower extremity and right upper extremity. ?Sensation: As above ? ?Labs ?I have reviewed labs in epic and the results pertinent to this consultation are: ? ?CBC ?   ?Component Value Date/Time  ? WBC 30.8 (H) 11/04/2021 0315  ? RBC 2.47 (L) 11/04/2021 0315  ? HGB 7.0 (L) 11/04/2021 0315  ? HCT 21.0 (L) 11/04/2021 0315  ? PLT 218 11/04/2021 0315  ? MCV 85.0 11/04/2021 0315  ? MCH 28.3 11/04/2021 0315  ? MCHC 33.3 11/04/2021 0315  ? RDW 13.5 11/04/2021 0315  ? ? ?CMP  ?   ?Component Value Date/Time  ? NA 132 (L) 11/04/2021 0315   ? K 4.0 11/04/2021 0315  ? CL 103 11/04/2021 0315  ? CO2 21 (L) 11/04/2021 0315  ? GLUCOSE 117 (H) 11/04/2021 0315  ? BUN 15 11/04/2021 0315  ? CREATININE 0.78 11/04/2021 0315  ? CALCIUM 7.5 (L) 11/04/2021 0315  ? PROT 8.5 (H) 11/01/2021 1354  ? ALBUMIN 3.8 11/01/2021 1354  ? AST 34 11/01/2021 1354  ? ALT 18 11/01/2021 1354  ? ALKPHOS 100 11/01/2021 1354  ? BILITOT 0.5 11/01/2021 1354  ? GFRNONAA >60 11/04/2021 0315  ? ? ?Imaging ?I have reviewed the images obtained-- ? ?CT-head preop: Large right-sided subdural hematoma measuring 17 to 20 mm with 8 to 9 mm midline shift. ?Postoperatively-new CT head-with right subdural hematoma evacuation status post drain placement.  Decreased midline shift and trapping of the left lateral ventricle. ? ?Pre and postop representative images pasted below ? ? ?CT head from today pending ? ?Assessment:  ?88 year old man with a large right-sided subdural hematoma status postevacuation and tracheostomy, also presented with DKA which is  now being treated along with worsening leukocytosis.  Recent admission for a small subdural hematoma managed conservatively, urosepsis. ?On examination, has some brainstem reflexes and some questionable meaningful withdrawal on the right side but is plegic on the left. ?Given his advanced age, large size of the subdural hematoma along with midline shift which was evacuated, and other comorbidities, he is likely to have a complicated recovery course due to infections and underlying diabetes and DKA that he presented with.  I want to get an EEG to rule out any seizure activity which might be muddying the waters of our examination. ?With his advanced age, comorbid disease and large subdural hematoma-he is likely to have a poor neurological recovery and may not return to his prior baseline at all. ? ?Impression: ?-Altered mental status-multifactorial due to large subdural hematoma, possibly underlying UTI/infection, advanced age and DKA-DKA has since  resolved ?-Large right sided subdural hematoma with midline shift (Brain compression) ? ? ?Recommendations: ?Supportive management per CCM ?Postoperative management per neurosurgery ?Routine EEG to rule out seizures ?Continue to keep

## 2021-11-04 NOTE — Consult Note (Signed)
Pharmacy Antibiotic Note ? ?Christopher [name] is a 88 y.o. male admitted on 11/01/2021 with Acute Metabolic Encephalopathy in setting of large subdural hematoma with associated mass effect with left midline shift. Pharmacy has been consulted for Vancomycin dosing. Patient also on cefepime ? ?MRSA PCR negative 11/01/21. After discussion with provider, continue with restarting vancomycin therapy.  ? ?Plan: ?Continue cefepime 2 gram Q12H ? ?Initiate Vancomycin 1500 mg LD x 1 followed by Vancomycin 1000 mg Q24H. Goal AUC 400-550 ?Estimated AUC 544/Cmin: 13.8 ?Scr 0.8, IBW, Vd 0.72   ? ? ?Height: 5\' 2"  (157.5 cm) ?Weight: 61.7 kg (136 lb 0.4 oz) ?IBW/kg (Calculated) : 54.6 ? ?Temp (24hrs), Avg:100.3 ?F (37.9 ?C), Min:99.5 ?F (37.5 ?C), Max:101.1 ?F (38.4 ?C) ? ?Recent Labs  ?Lab 11/01/21 ?1354 11/01/21 ?1800 11/01/21 ?2023 11/01/21 ?2210 11/02/21 ?0641 11/02/21 ?0730 11/03/21 ?0400 11/04/21 ?0315  ?WBC 19.4*  --   --   --   --  17.9* 31.5* 30.8*  ?CREATININE 1.10 0.97  --  0.87 0.83  --  0.94 0.78  ?LATICACIDVEN 8.6*  --  3.6*  --  2.5*  --   --   --   ?  ?Estimated Creatinine Clearance: 44.6 mL/min (by C-G formula based on SCr of 0.78 mg/dL).   ? ?Allergies  ?Allergen Reactions  ? Beef-Derived Products Other (See Comments)  ?  Religious beliefs ?  ? Hydrochlorothiazide   ? Penicillins   ? Pork-Derived Products Other (See Comments)  ?  Religious beliefs  ? ? ?Antimicrobials this admission: ?Cefepime 4/28>> ?Vancomycin 4/28 x 1 dose ?Flagyl 4/28 x 1 dose ?5/1 Vancomycin >> ? ?Dose adjustments this admission: ? ? ?Microbiology results: ?4/28 BCx: NGTD ?5/1 Sputum: ordered  ?4/28 MRSA PCR: negative ? ?Thank you for allowing pharmacy to be a part of this patient?s care. ? ?5/28, PharmD, BCPS ?Clinical Pharmacist   ?11/04/2021 5:38 PM ? ?

## 2021-11-05 ENCOUNTER — Ambulatory Visit: Payer: Self-pay | Admitting: Urology

## 2021-11-05 DIAGNOSIS — Z7189 Other specified counseling: Secondary | ICD-10-CM | POA: Diagnosis not present

## 2021-11-05 DIAGNOSIS — Z515 Encounter for palliative care: Secondary | ICD-10-CM | POA: Diagnosis not present

## 2021-11-05 DIAGNOSIS — E1311 Other specified diabetes mellitus with ketoacidosis with coma: Secondary | ICD-10-CM

## 2021-11-05 DIAGNOSIS — S065XAA Traumatic subdural hemorrhage with loss of consciousness status unknown, initial encounter: Secondary | ICD-10-CM | POA: Diagnosis not present

## 2021-11-05 DIAGNOSIS — G939 Disorder of brain, unspecified: Secondary | ICD-10-CM | POA: Diagnosis not present

## 2021-11-05 LAB — BASIC METABOLIC PANEL
Anion gap: 6 (ref 5–15)
BUN: 19 mg/dL (ref 8–23)
CO2: 22 mmol/L (ref 22–32)
Calcium: 7.6 mg/dL — ABNORMAL LOW (ref 8.9–10.3)
Chloride: 105 mmol/L (ref 98–111)
Creatinine, Ser: 0.79 mg/dL (ref 0.61–1.24)
GFR, Estimated: >60 mL/min (ref >60)
Glucose, Bld: 159 mg/dL — ABNORMAL HIGH (ref 70–99)
Potassium: 3.2 mmol/L — ABNORMAL LOW (ref 3.5–5.1)
Sodium: 133 mmol/L — ABNORMAL LOW (ref 135–145)

## 2021-11-05 LAB — GLUCOSE, CAPILLARY
Glucose-Capillary: 171 mg/dL — ABNORMAL HIGH (ref 70–99)
Glucose-Capillary: 152 mg/dL — ABNORMAL HIGH (ref 70–99)
Glucose-Capillary: 164 mg/dL — ABNORMAL HIGH (ref 70–99)
Glucose-Capillary: 174 mg/dL — ABNORMAL HIGH (ref 70–99)
Glucose-Capillary: 169 mg/dL — ABNORMAL HIGH (ref 70–99)
Glucose-Capillary: 191 mg/dL — ABNORMAL HIGH (ref 70–99)

## 2021-11-05 LAB — MAGNESIUM: Magnesium: 1.9 mg/dL (ref 1.7–2.4)

## 2021-11-05 LAB — PHOSPHORUS: Phosphorus: 1.6 mg/dL — ABNORMAL LOW (ref 2.5–4.6)

## 2021-11-05 MED ORDER — POTASSIUM & SODIUM PHOSPHATES 280-160-250 MG PO PACK
1.0000 | PACK | Freq: Three times a day (TID) | ORAL | Status: AC
  Administered 2021-11-05 – 2021-11-07 (×9): 1
  Filled 2021-11-05 (×8): qty 1

## 2021-11-05 MED ORDER — POTASSIUM CHLORIDE 20 MEQ PO PACK
40.0000 meq | PACK | Freq: Once | ORAL | Status: AC
  Administered 2021-11-05: 40 meq
  Filled 2021-11-05: qty 2

## 2021-11-05 MED ORDER — INSULIN DETEMIR 100 UNIT/ML ~~LOC~~ SOLN
0.2500 [IU]/kg | SUBCUTANEOUS | Status: DC
Start: 2021-11-05 — End: 2021-11-08
  Administered 2021-11-06 – 2021-11-08 (×3): 14 [IU] via SUBCUTANEOUS
  Filled 2021-11-05 (×3): qty 0.14

## 2021-11-05 NOTE — Consult Note (Signed)
Pharmacy Antibiotic Note ? ?Christopher [name] is a 88 y.o. male admitted on 11/01/2021 with large subdural hematoma with associated mass effect with left midline shift. Patient is post-op tracheostomy 4/29 and craniotomy for subdural evacuation on 4/30 with drain placement. Drain was subsequently removed 5/1. Patient developed new-onset fevers on 5/1 and empiric antibiotics were started. Pharmacy has been consulted for vancomycin dosing. Patient also on cefepime ? ?Source of infection not entirely clear at this point. ? ?Plan: ? ?Cefepime 2g IV q12h ? ?Vancomycin 1000 mg q24h ?--Goal AUC 400-550 ?--Estimated AUC 566, Cmin: 13.8 ?--Daily Scr per protocol ?--Levels at stable state as clinically indicated ? ? ?Height: 5\' 2"  (157.5 cm) ?Weight: 59.3 kg (130 lb 11.7 oz) ?IBW/kg (Calculated) : 54.6 ? ?Temp (24hrs), Avg:100.1 ?F (37.8 ?C), Min:99.5 ?F (37.5 ?C), Max:100.6 ?F (38.1 ?C) ? ?Recent Labs  ?Lab 11/01/21 ?1354 11/01/21 ?1800 11/01/21 ?2023 11/01/21 ?2210 11/02/21 ?0641 11/02/21 ?0730 11/03/21 ?0400 11/04/21 ?01/04/22 11/05/21 ?01/05/22  ?WBC 19.4*  --   --   --   --  17.9* 31.5* 30.8*  --   ?CREATININE 1.10   < >  --  0.87 0.83  --  0.94 0.78 0.79  ?LATICACIDVEN 8.6*  --  3.6*  --  2.5*  --   --   --   --   ? < > = values in this interval not displayed.  ? ?  ?Estimated Creatinine Clearance: 44.6 mL/min (by C-G formula based on SCr of 0.79 mg/dL).   ? ?Allergies  ?Allergen Reactions  ? Beef-Derived Products Other (See Comments)  ?  Religious beliefs ?  ? Hydrochlorothiazide   ? Penicillins   ? Pork-Derived Products Other (See Comments)  ?  Religious beliefs  ? ? ?Antimicrobials this admission: ?Metronidazole 4/28 x 1 ?Cefepime 4/28 >> 4/29, 5/1 >> ?Vancomycin 4/28 x 1, 5/1 >> ? ?Dose adjustments this admission: ?N/A ? ?Microbiology results: ?4/28 Bcx: NG ?4/28 MRSA PCR: (-) ?5/1 Tracheal aspirate: GPC, pending ? ?Thank you for allowing pharmacy to be a part of this patient?s care. ? ?7/1 ?11/05/2021 11:14 AM ? ?

## 2021-11-05 NOTE — Progress Notes (Signed)
? ?NAME:  Christopher [name], MRN:  [id number], DOB:  [birth year], LOS: 4 ?ADMISSION DATE:  11/01/2021, CONSULTATION DATE:  11/01/2021 ?REFERRING MD: Dr. Corky Downs, CHIEF COMPLAINT:  Unresponsive  ? ?Brief Pt Description / Synopsis:  ?88 y.o. Male admitted with Acute Metabolic Encephalopathy in setting of large subdural hematoma with associated mass effect with left midline shift.  Pt deemed not a surgical candidate by Neurosurgery.  Pt also found to have DKA. ? ?History of Present Illness:  ?Christopher [name] is a 88 year old male with unknown past medical history who presents to [facility] ED on 11/01/2021 due to unresponsiveness. Pt is currently intubated and unable to provide history and no family is currently available, therefore history is obtained from chart review. ? ?EMS reported the patient had a fall off the toilet, apparently hit his head, and was unresponsive.  EMS noted the patient to be posturing with them. ? ?Shortly after arrival to the ED he was intubated for airway protection due to GCS score of 3.  Patient remained unresponsive with fixed pupils, along with decorticate posturing noted on the right. ? ?ED Course: ?Initial Vital Signs: Temperature 92 Fahrenheit, pulse 95, respiratory rate 18, BP 158/103, SPO2 100% ?Significant Labs: Sodium 134, bicarb 14, glucose 442, BUN 31, creatinine 1.1, anion gap 19, high-sensitivity troponin 6, lactic acid 8.6, WBC 19.4, PT 14.8, INR 1.2, beta hydroxybutyric acid 1.78 ?Urinalysis negative for UTI ?Urine drug screen negative ?Imaging ?Chest X-ray>>FINDINGS: ?Interval placement of ET tube with tip positioned approximately 2.0 ?cm from the carina. Enteric tube coursing below the diaphragm. ?Cardiac and mediastinal contours are unchanged. Mild bibasilar ?atelectasis. Trace right pleural effusion. ?CT head without contrast>>IMPRESSION: ?1. Increased size of a now large (2 cm thick) acute right cerebral ?convexity subdural hemorrhage. Significant associated mass effect ?with 1.4 cm of  leftward midline shift. Recommend urgent ?neurosurgical consultation. ?2. Rounding of the left temporal horn, compatible with ventricular ?entrapment. ?3. 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 ?Medications Administered: 1 L normal saline bolus, cefepime, Flagyl, vancomycin, insulin drip ? ?ED provider discussed with Dr. Lacinda Axon of Neurosurgery regarding CT head showing large subdural hematoma.  Neurosurgery deemed that patient was not a surgical candidate.  He was given IV fluids along with broad-spectrum antibiotics including IV cefepime, Flagyl, vancomycin as he met SIRS criteria.  He was also placed on IV insulin drip due to meeting DKA criteria. ? ?PCCM is asked to admit the patient to ICU for further work-up and treatment. ? ?Pertinent  Medical History  ?No past medical history on file. ? ? ?Micro Data:  ?4/28: SARS-CoV-2 and influenza PCR>>negative ?4/28: Blood culture x2>> ?5/01: Lurline Idol cult>> ? ?Antimicrobials:  ?Cefepime 4/28>> ?Vancomycin 4/28>> ?Flagyl 4/28 x 1 dose ? ?Significant Hospital Events: ?Including procedures, antibiotic start and stop dates in addition to other pertinent events   ?4/28: Presented to ER unresponsive, fixed pupils, posturing.  Intubated for airway protection.  Found to have large subdural hematoma with mass effect and left shift.  Deemed not a surgical candidate by neurosurgery.  Recommended comfort care, however family wants all aggressive measures.  PCCM asked to admit ?4/29 remains encephalopathic, poor prognosis ?4/30 s/p CRANIOTOMY AND s/p TRACH ?5/01 CNS drain removed by neurosurg ?5/02 Dr. Rory Percy met with family, pt remains encephalopathic ? ?Interim History / Subjective:  ?-Patient examined in the ICU, currently on no sedatives ?-Unresponsive ?-Occ will move RLE ?- No change in exam from yesterday ? ?Objective   ?Blood pressure 129/61, pulse (!) 101, temperature 100 ?  F (37.8 ?C), resp. rate 20, height _0  (1.575 m),  weight 59.3 kg, SpO2 100 %. ?   ?Vent Mode: PRVC ?FiO2 (%):  [28 %] 28 % ?Set Rate:  [18 bmp] 18 bmp ?Vt Set:  [500 mL] 500 mL ?PEEP:  [5 cmH20] 5 cmH20 ?Plateau Pressure:  [17 cmH20-20 cmH20] 17 cmH20  ? ?Intake/Output Summary (Last 24 hours) at 11/05/2021 1101 ?Last data filed at 11/05/2021 0800 ?Gross per 24 hour  ?Intake 745.2 ml  ?Output 950 ml  ?Net -204.8 ml  ? ? ?Filed Weights  ? 11/03/21 0500 11/04/21 0357 11/05/21 0343  ?Weight: 57.2 kg 61.7 kg 59.3 kg  ? ? ?Examination: ?General: Chronically ill-appearing male, laying in bed, mechanically ventilated via trach. ?HEENT: S/P crani for SDH evac, dressing intact ?Lungs: Clear breath sounds bilaterally, and no wheezing or rales noted, occasionally with spontaneous breath above the vent, even, nonlabored ?Cardiovascular: Regular rate and rhythm, S1-S2, no murmurs, rubs, gallops ?Abdomen: Soft, nontender, nondistended, no guarding rebound tenderness, bowel sounds positive x4 ?Extremities: Normal bulk and tone, no deformities, no edema ?Neuro: On no sedatives, does have positive cough/gag reflex, withdraws all extremities to pain, pupils fixed and unequal (right 5 mm, left 3 mm) ?GU: Foley catheter in place ? ?Resolved Hospital Problem list   ?SDH evacuated 4/30 ? ?Assessment & Plan:  ? ?Acute Encephalopathy in setting of Large Subdural Hematoma with associated mass effect and left midline shift ?Intubated for airway protection ?-Avoid sedating medications as able ?-continue Mechanical Ventilator support ?-Wean Fio2 and PEEP as tolerated ?-VAP/VENT bundle implementation ?-Frequent Neuro checks ?-Goal SBP <140 ?-NO antiplatelets or anticoagulants ?-Implement measures to reduce ICP: HOB >30 degrees, prevent Hypoxia and Hypotension ?-Currently exhibiting no signs of seizures, will hold off on empiric AED's for now ?- Neurology consulting, discussed with Dr. Rory Percy, appreciate input ?- Intermittent chest x-ray & ABG PRN ?- Ensure adequate pulmonary hygiene  ?- PRN  bronchodilators ?- Currently on antibiotics ? ?DKA - resolved ?-Follow DKA protocol ?-Aggressive IV fluids ?-Insulin drip ?-Follow BMP every 4 hours, once anion gap closed and serum bicarb greater than 20, can convert to long-acting insulin and sliding scale insulin ?-Consult diabetes coordinator, appreciate input ?-Follow ICU Hypo/Hyperglycemia protocol ? ?Meets SIRS Criteria at admission  ?Pneumonia versus tracheitis, cultures pending ?-Monitor fever curve ?-Trend WBC's & Procalcitonin ?-Follow cultures as above ?-Continue empiric Cefepime and Vancomycin pending cultures &  sensitivities ?-Urinalysis negative for UTI, admit chest x-ray without evidence of pneumonia ?-Repeat x-ray in a.m. ?-Trach secretions increased, cultured ? ?Mild Hyponatremia, suspect hypertonic given DKA (corrected Na given glucose of 442 is Na 139) ?-Monitor I&O's / urinary output ?-Follow BMP ?-Ensure adequate renal perfusion ?-Avoid nephrotoxic agents as able ?-Replace electrolytes as indicated ?-IV fluids ? ? ? ? ?Best Practice (right click and "Reselect all SmartList Selections" daily)  ? ?Diet/type: NPO ?DVT prophylaxis: SCD ?GI prophylaxis: PPI ?Lines: N/A ?Foley:  N/A ?Code Status:  full code ?Last date of multidisciplinary goals of care discussion [11/04/21] ? ? ? ?Labs   ?CBC: ?Recent Labs  ?Lab 11/01/21 ?1354 11/02/21 ?0730 11/03/21 ?0400 11/04/21 ?0315  ?WBC 19.4* 17.9* 31.5* 30.8*  ?HGB 14.0 11.7* 8.2* 7.0*  ?HCT 43.9 34.4* 24.2* 21.0*  ?MCV 87.8 83.1 83.7 85.0  ?PLT 336 209 243 218  ? ? ? ?Basic Metabolic Panel: ?Recent Labs  ?Lab 11/01/21 ?2210 11/02/21 ?0641 11/03/21 ?0400 11/04/21 ?5670 11/05/21 ?1410  ?NA 137 135 129* 132* 133*  ?K 3.8 4.1 4.1 4.0 3.2*  ?CL 106  103 105 103 105  ?CO2 24 21* 19* 21* 22  ?GLUCOSE 123* 100* 193* 117* 159*  ?BUN _0 ?CREATININE 0.87 0.83 0.94 0.78 0.79  ?CALCIUM 8.4* 8.4* 7.0* 7.5* 7.6*  ?MG 1.5* 2.3 1.7 1.9 1.9  ?PHOS 1.5* 4.4 2.8 2.3* 1.6*  ? ? ?GFR: ?Estimated Creatinine Clearance:  44.6 mL/min (by C-G formula based on SCr of 0.79 mg/dL). ?Recent Labs  ?Lab 11/01/21 ?1354 11/01/21 ?1800 11/01/21 ?2023 11/02/21 ?0641 11/02/21 ?0730 11/03/21 ?0400 11/04/21 ?0315  ?PROCALCITON  --  0.58  --  0.

## 2021-11-05 NOTE — Consult Note (Signed)
PHARMACY CONSULT NOTE ? ?Pharmacy Consult for Electrolyte Monitoring and Replacement  ? ?Recent Labs: ?Potassium (mmol/L)  ?Date Value  ?11/05/2021 3.2 (L)  ? ?Magnesium (mg/dL)  ?Date Value  ?11/05/2021 1.9  ? ?Calcium (mg/dL)  ?Date Value  ?11/05/2021 7.6 (L)  ? ?Albumin (g/dL)  ?Date Value  ?11/01/2021 3.8  ? ?Phosphorus (mg/dL)  ?Date Value  ?11/05/2021 1.6 (L)  ? ?Sodium (mmol/L)  ?Date Value  ?11/05/2021 133 (L)  ? ?Assessment: ?Patient is a 88 y/o M with medical history including no pertinent past medical history on file who is admitted after fall complicated by large subdural hematoma. Patient is status post tracheostomy on 4/29 and craniotomy with hematoma evacuation on 5/1. Patient is admitted to the ICU where he remains intubated, sedated, and on mechanical ventilation. Pharmacy consulted to assist with electrolyte monitoring and replacement as indicated. ? ?Nutrition: Tube feeds started 5/1 ? ?Goal of Therapy:  ?Electrolytes within normal limits ? ?Plan:  ?--K 3.2, Kcl 40 mEq per tube x 1 ?--Phos 1.6, Phos-Nak TIDHS scheduled for now ?--Follow-up electrolytes with AM labs tomorrow ? ?Tressie Ellis ?11/05/2021 11:24 AM  ?

## 2021-11-05 NOTE — TOC Initial Note (Signed)
Transition of Care (TOC) - Initial/Assessment Note  ? ? ?Patient Details  ?Name: Christopher [name] ?MRN: [id number] ?Date of Birth: April 03, 1926 ? ?Transition of Care (TOC) CM/SW Contact:    ?Shelbie Hutching, RN ?Phone Number: ?11/05/2021, 5:19 PM ? ?Clinical Narrative:                 ?Patient currently in the ICU trached and on vent.  Craniotomy and trach performed on Sunday.  Patient fell at home and hit his head sustaining a large subdural hematoma with brain damage.  Poor prognosis.  Palliative care is following.  ? ?TOC will cont to follow.  ? ?  ?  ? ? ?Patient Goals and CMS Choice ?  ?  ?  ? ?Expected Discharge Plan and Services ?  ?  ?  ?  ?  ?                ?  ?  ?  ?  ?  ?  ?  ?  ?  ?  ? ?Prior Living Arrangements/Services ?  ?  ?  ?       ?  ?  ?  ?  ? ?Activities of Daily Living ?  ?  ? ?Permission Sought/Granted ?  ?  ?   ?   ?   ?   ? ?Emotional Assessment ?  ?  ?  ?  ?  ?  ? ?Admission diagnosis:  Brain damage [G93.9] ?Subdural hematoma ([place]) [S06.5XAA] ?Diabetic ketoacidosis with coma associated with other specified diabetes mellitus ([place]) [E13.11] ?Patient Active Problem List  ? Diagnosis Date Noted  ? Brain damage 11/01/2021  ? ?PCP:  Donnie Coffin, MD ?Pharmacy:   ?[facility], [place] ?[address] ?[place] [address] ?Phone: [phone] Fax: [phone] ? ? ? ? ?Social Determinants of Health (SDOH) Interventions ?  ? ?Readmission Risk Interventions ?   ? View : No data to display.  ?  ?  ?  ? ? ? ?

## 2021-11-05 NOTE — Progress Notes (Signed)
Neurology Progress Note ? ? ?S:// ?Seen and examined this morning. ?No overnight changes ? ? ?O:// ?Current vital signs: ?BP 129/61   Pulse (!) 101   Temp 100 ?F (37.8 ?C)   Resp 20   Ht $R'5\' 2"'qr$  (1.575 m)   Wt 59.3 kg   SpO2 100%   BMI 23.91 kg/m?  ?Vital signs in last 24 hours: ?Temp:  [99.5 ?F (37.5 ?C)-100.6 ?F (38.1 ?C)] 100 ?F (37.8 ?C) (05/02 0600) ?Pulse Rate:  [89-117] 101 (05/02 0600) ?Resp:  [15-30] 20 (05/02 0600) ?BP: (108-147)/(49-71) 129/61 (05/02 0600) ?SpO2:  [100 %] 100 % (05/02 0600) ?FiO2 (%):  [28 %] 28 % (05/02 0811) ?Weight:  [59.3 kg] 59.3 kg (05/02 0343) ?General: Trached, no sedation ?HEENT: Bandages in place from the craniotomy ?CVS: Regular rhythm ?Respiratory: Trach ?Neurological exam ?Patient is on no sedation ?He is trached ?I did not notice any spontaneous movements ?To loud voice, there is some eye-opening but it is inconsistent. ?To noxious stimulation, he does tend to open eyes. ?Nonverbal ?Does not follow commands ?Cranial nerves: Pupils are irregular and asymmetric with the right pupil about 7 mm nonreactive and left pupil 6 mm irregular and nonreactive to light.  Corneal reflexes are present.  Initially it seemed like he had a down and left gaze but that quickly changed to rightward gaze and then to midline gaze-I suspect that he might be having roving eye movements. ?He does not blink to threat from either side.  Facial symmetry is difficult to ascertain but there might be a left lower facial weakness. ?Motor examination: Flaccid left upper extremity.  Minimal movement to noxious stimulation in the left lower extremity.  Strong withdrawal to noxious stimulation in the right lower extremity.  There is some withdrawal to noxious stimulation in the right upper extremity as well. ?Sensory exam: As above ?Coordination cannot be assessed given his mentation ? ?Medications ? ?Current Facility-Administered Medications:  ?  0.9 %  sodium chloride infusion, 250 mL, Intravenous, PRN,  Mortimer Fries, Kurian, MD ?  0.9 %  sodium chloride infusion, 250 mL, Intravenous, Continuous, Darel Hong D, NP, Last Rate: 10 mL/hr at 11/04/21 1900, Infusion Verify at 11/04/21 1900 ?  acetaminophen (TYLENOL) suppository 650 mg, 650 mg, Rectal, Q6H PRN, Teressa Lower, NP, 650 mg at 11/04/21 2338 ?  acetaminophen (TYLENOL) tablet 650 mg, 650 mg, Per Tube, Q4H PRN, Rust-Chester, Huel Cote, NP, 650 mg at 11/05/21 1103 ?  ceFEPIme (MAXIPIME) 2 g in sodium chloride 0.9 % 100 mL IVPB, 2 g, Intravenous, Q12H, Teressa Lower, NP, Last Rate: 200 mL/hr at 11/05/21 0919, 2 g at 11/05/21 0919 ?  chlorhexidine gluconate (MEDLINE KIT) (PERIDEX) 0.12 % solution 15 mL, 15 mL, Mouth Rinse, BID, Kasa, Kurian, MD, 15 mL at 11/05/21 0825 ?  Chlorhexidine Gluconate Cloth 2 % PADS 6 each, 6 each, Topical, Q0600, Flora Lipps, MD, 6 each at 11/05/21 0510 ?  dextrose 50 % solution 0-50 mL, 0-50 mL, Intravenous, PRN, Lavonia Drafts, MD ?  docusate (COLACE) 50 MG/5ML liquid 100 mg, 100 mg, Per Tube, BID, Rust-Chester, Britton L, NP, 100 mg at 11/05/21 0919 ?  feeding supplement (VITAL AF 1.2 CAL) liquid 1,000 mL, 1,000 mL, Per Tube, Continuous, Kasa, Kurian, MD, Last Rate: 45 mL/hr at 11/05/21 0800, Rate Change at 11/05/21 0800 ?  fentaNYL (SUBLIMAZE) injection 25-100 mcg, 25-100 mcg, Intravenous, Q30 min PRN, Rust-Chester, Britton L, NP, 100 mcg at 11/02/21 0132 ?  free water 30 mL, 30 mL, Per Tube,  Dorthula Nettles, MD, 30 mL at 11/05/21 0800 ?  insulin aspart (novoLOG) injection 0-9 Units, 0-9 Units, Subcutaneous, Q4H, Rust-Chester, Huel Cote, NP, 2 Units at 11/05/21 0824 ?  insulin detemir (LEVEMIR) injection 14 Units, 0.25 Units/kg, Subcutaneous, Q24H, Tyler Pita, MD ?  MEDLINE mouth rinse, 15 mL, Mouth Rinse, 10 times per day, Flora Lipps, MD, 15 mL at 11/05/21 0920 ?  ondansetron (ZOFRAN) injection 4 mg, 4 mg, Intravenous, Q6H PRN, Flora Lipps, MD ?  pantoprazole sodium (PROTONIX) 40 mg/20 mL oral suspension 40 mg, 40 mg,  Per Tube, Daily, Rust-Chester, Britton L, NP, 40 mg at 11/05/21 0919 ?  polyethylene glycol (MIRALAX / GLYCOLAX) packet 17 g, 17 g, Per Tube, Daily, Rust-Chester, Britton L, NP, 17 g at 11/05/21 0919 ?  sodium chloride flush (NS) 0.9 % injection 3 mL, 3 mL, Intravenous, Q12H, Kasa, Kurian, MD, 3 mL at 11/05/21 0920 ?  sodium chloride flush (NS) 0.9 % injection 3 mL, 3 mL, Intravenous, PRN, Flora Lipps, MD ?  [COMPLETED] vancomycin (VANCOREADY) IVPB 1500 mg/300 mL, 1,500 mg, Intravenous, Once, Last Rate: 150 mL/hr at 11/04/21 1900, Infusion Verify at 11/04/21 1900 **FOLLOWED BY** vancomycin (VANCOCIN) IVPB 1000 mg/200 mL premix, 1,000 mg, Intravenous, Q24H, Dorothe Pea, RPH ?Labs ?CBC ?   ?Component Value Date/Time  ? WBC 30.8 (H) 11/04/2021 0315  ? RBC 2.47 (L) 11/04/2021 0315  ? HGB 7.0 (L) 11/04/2021 0315  ? HCT 21.0 (L) 11/04/2021 0315  ? PLT 218 11/04/2021 0315  ? MCV 85.0 11/04/2021 0315  ? MCH 28.3 11/04/2021 0315  ? MCHC 33.3 11/04/2021 0315  ? RDW 13.5 11/04/2021 0315  ? ? ?CMP  ?   ?Component Value Date/Time  ? NA 133 (L) 11/05/2021 0545  ? K 3.2 (L) 11/05/2021 0545  ? CL 105 11/05/2021 0545  ? CO2 22 11/05/2021 0545  ? GLUCOSE 159 (H) 11/05/2021 0545  ? BUN 19 11/05/2021 0545  ? CREATININE 0.79 11/05/2021 0545  ? CALCIUM 7.6 (L) 11/05/2021 0545  ? PROT 8.5 (H) 11/01/2021 1354  ? ALBUMIN 3.8 11/01/2021 1354  ? AST 34 11/01/2021 1354  ? ALT 18 11/01/2021 1354  ? ALKPHOS 100 11/01/2021 1354  ? BILITOT 0.5 11/01/2021 1354  ? GFRNONAA >60 11/05/2021 0545  ? ?EEG yesterday: Suggestive of cortical dysfunction in the right hemisphere as well as moderate to severe diffuse encephalopathy of nonspecific etiology.  No seizures ? ?Imaging ?I have reviewed images in epic and the results pertinent to this consultation are: ?CT head yesterday with slight improvement in the size of right frontoparietal subdural hematoma status postevacuation with slight improvement in the leftward midline shift which is now 5 mm.   The thickness of the subdural diminished from 2.1 cm to 1.8 cm. ? ?Assessment:  ?88 year old with a large right-sided subdural hematoma status post craniotomy evacuation who continues to be unresponsive, on vent via tracheostomy in spite of being on no sedation for the last couple of days. ?Given his advanced age, large sided subdural, and possible poor neurological baseline, his recovery from a neurologically meaningful standpoint might not be back to his baseline prior to the event. ?There is a family meeting set this afternoon-I will attempt to attend. ? ?Impression: ?Encephalopathy due to brainstem compression from a large subdural hematoma. ? ? ?Recommendations: ?Supportive care per ICU ?Postop care per neurosurgery including blood pressure goals set by neurosurgery. ?No antiplatelets or anticoagulants ?I will attend a family meeting and attempt to answer questions of the family might  have from a neurological standpoint-as said above, given the above factors, recovery to a normal baseline either may not happen or might be extremely protracted. ? ? ?Addendum ?Met with the family-sons and daughter at bedside ?Primary care provider is son Samuella Bruin. ?Insert all his questions ?Family was disappointed that none of the providers have provided him hope. ?I described the clinical picture, showed them the images and discussed why everyone is not so hopeful given the large extent of the bleed, advanced age etc. ?They would like to continue current scope of care for now. ?I will follow-up again tomorrow. ?Plan discussed with Dr. Patsey Berthold and Vinie Sill from palliative ? ?-- ?Amie Portland, MD ?Neurologist ?[facility] ?Pager: [phone] ? ?CRITICAL CARE ATTESTATION ?Performed by: Amie Portland, MD ?Total critical care time: 60 minutes ?Critical care time was exclusive of separately billable procedures and treating other patients and/or supervising APPs/Residents/Students ?Critical care was necessary to treat or  prevent imminent or life-threatening deterioration due to subdural hematoma, brain compression ?This patient is critically ill and at significant risk for neurological worsening and/or death and care requires co

## 2021-11-05 NOTE — Progress Notes (Signed)
Palliative: ? ?HPI: 88 y.o. male  with medical history of vascular dementia, CVA x3, HTN, HLD, DM admitted on 11/01/2021 with fall and hitting head found unconscious by family and EMS called and found to have large subdural hematoma with significant left midline shift and DKA. He required intubation and noted to have fixed pupils and decorticate posturing. Overall prognosis very poor per neurosurgery and PCCM who have communicated this to family. Family opted to proceed with surgical intervention and tracheostomy was placed along with craniotomy 4/29. Worsened neurological exam noted 5/1 and stat head CT ordered but no signs of worsening but instead signs of slight improvement.  ? ?I spoke today with RN, Dr. Patsey Berthold. Family are at bedside. I met with family and they express that Dr. Rory Percy was able to speak with them and that they have better understanding. They are glad that they were able to have this conversation. They tell me that they still are hopeful for more time to give their father any chance of recovery. At this time they have no further questions or concerns. They are appreciative of the care of their father. I encouraged them to let us know anything we can do to help support them or answer any questions.  ? ?I spoke further with Dr. Rory Percy who was able to connect with family today. Noted that culturally they may not be able to set limits or be able to provide permission to liberalize him from ventilator support as this could be considered disrespectful in their culture. Given circumstances I anticipate they will need further discussions over the coming days/weeks regarding feeding tube and aggressiveness of care moving forward. I do not anticipate any chances over the next couple days. Palliative care will follow for support and continued conversations but will not see daily.  ? ?Exam: Some slight eye opening today although not to command. He followed no commands to me (we should consider interpretor  services as he may revert to his primary language given his level of brain injury). Tolerating ETT to vent. No distress. Abd soft. Movement of bilateral feet R > L noted. No withdrawal of LUE to painful stimuli.  ? ?Plan: ?- DNR is already in place from previous conversation with PCCM and family.  ?- Family wish for more time with hopes of some level of improvement.  ? ?25 min ? ?Christopher Sill, NP ?Palliative Medicine Team ?Pager [phone] (Please see [email or web address] for schedule) ?Team Phone [phone]  ? ? ?Greater than 50%  of this time was spent counseling and coordinating care related to the above assessment and plan   ?

## 2021-11-05 NOTE — Progress Notes (Signed)
RE: Derenne Family ?  ?  ?To Whom It May Concern, ?  ?           This letter is to confirm Ms. Seward Meth Zukoski is currently with her father, Camerron Garnier, who is in the Intensive Care Unit At [facility] and she is the sole provider to her father. Patient was admitted on April 28th, 2023 and is in a very critical condition. The patient status and length of stay is TBD. ?  ?If there are any questions please call [phone]. ? ? ?Cordially, ? ? ? ?C. Derrill Kay, MD ?Advanced Bronchoscopy ?PCCM [facility] ? ?  ?

## 2021-11-05 NOTE — Progress Notes (Addendum)
? ?Progress Note  ? ?Date: 11/05/2021 ? ?HPI:Mr Ende is here after a fall and sustained a large subdural hematoma. He had DKA on admission also and his exam showed lack of brainstem function. He is intubated in ICU ? ?11/06/21: No neurologic changes overnight. ? ?11/05/21: neurologically stable overnight  ? ?11/04/21: RN at bedside states pt was saying his name last night for family and localizing. He remains off of sedation today with trach in place. JP drain clearing.  ?4/30: Patient is on a ventilator with tracheostomy in place.  He is not on any sedation.  There have been reports of spontaneous movement and this is seen at bedside in his lower extremities.  The JP drain has had 50 cc of output. ? ? ?Vital Signs: ?Temp:  [99.5 ?F (37.5 ?C)-100.6 ?F (38.1 ?C)] 100 ?F (37.8 ?C) (05/02 0600) ?Pulse Rate:  [88-117] 101 (05/02 0600) ?Resp:  [15-30] 20 (05/02 0600) ?BP: (94-147)/(49-71) 129/61 (05/02 0600) ?SpO2:  [100 %] 100 % (05/02 0600) ?FiO2 (%):  [28 %] 28 % (05/02 0811) ?Weight:  [59.3 kg] 59.3 kg (05/02 0343) ?Temp (24hrs), Avg:100.1 ?F (37.8 ?C), Min:99.5 ?F (37.5 ?C), Max:100.6 ?F (38.1 ?C) ? ?Weight: 59.3 kg ? ? ?Problem List ?Patient Active Problem List  ? Diagnosis Date Noted  ? Brain damage 11/01/2021  ? ? ?Medications: ?Scheduled Meds: ? chlorhexidine gluconate (MEDLINE KIT)  15 mL Mouth Rinse BID  ? Chlorhexidine Gluconate Cloth  6 each Topical Q0600  ? docusate  100 mg Per Tube BID  ? free water  30 mL Per Tube Q4H  ? insulin aspart  0-9 Units Subcutaneous Q4H  ? insulin detemir  0.3 Units/kg Subcutaneous Q24H  ? mouth rinse  15 mL Mouth Rinse 10 times per day  ? pantoprazole sodium  40 mg Per Tube Daily  ? polyethylene glycol  17 g Per Tube Daily  ? sodium chloride flush  3 mL Intravenous Q12H  ? ?Continuous Infusions: ? sodium chloride    ? sodium chloride 10 mL/hr at 11/04/21 1900  ? ceFEPime (MAXIPIME) IV 2 g (11/04/21 2114)  ? feeding supplement (VITAL AF 1.2 CAL) 45 mL/hr at 11/05/21 0800  ?  vancomycin    ? ?PRN Meds:.sodium chloride, acetaminophen, acetaminophen, dextrose, fentaNYL (SUBLIMAZE) injection, ondansetron (ZOFRAN) IV, sodium chloride flush ? ?Labs: ? ?Lab Results  ?Component Value Date  ? WBC 30.8 (H) 11/04/2021  ? WBC 31.5 (H) 11/03/2021  ? HCT 21.0 (L) 11/04/2021  ? HCT 24.2 (L) 11/03/2021  ? PLT 218 11/04/2021  ? PLT 243 11/03/2021  ?  ?Lab Results  ?Component Value Date  ? INR 1.3 (H) 11/03/2021  ? APTT 42 (H) 11/03/2021  ? ? ?Lab Results  ?Component Value Date  ? NA 133 (L) 11/05/2021  ? NA 132 (L) 11/04/2021  ? K 3.2 (L) 11/05/2021  ? K 4.0 11/04/2021  ? BUN 19 11/05/2021  ? BUN 15 11/04/2021  ?  ?Lab Results  ?Component Value Date  ? MG 1.9 11/05/2021  ? ?Exam: ?Patient on ventilator with tracheostomy ?Opens eyes to voice ?Withdraws to pain in RLE only. ?Does not regard.  ? ?Imaging: ?11/03/21 ?IMPRESSION: ?New postoperative changes of right subdural hematoma evacuation and ?drain placement. Decreased midline shift and trapping of the left ?lateral ventricle. ?  ?  ?Electronically Signed ?  By: Macy Mis M.D. ?  On: 11/03/2021 14:35 ? ?Assessment/plan: ?Mr. [name] is now status post right-sided craniotomy for subdural evacuation ? ?- repeat head CT stable.  ?-  EEG consistent with moderate to severe diffuse encephalopathy  ?-JP removed 11/04/21 ?-Recommend continue control of blood pressure to systolic less than 584 ?-Hold DVT prophylaxis until postop day 2 ?-Continue antibiotics for drain ?-Recommend head of bed 30 degrees ?-We will continue to follow and will have better recommendation for prognosis as we give the patient more time to recover however there has not been any significant neurologic improvement to date. ? ?Loleta Dicker, PA-C ?Neurosurgery  ? ? ?

## 2021-11-06 DIAGNOSIS — G939 Disorder of brain, unspecified: Secondary | ICD-10-CM | POA: Diagnosis not present

## 2021-11-06 LAB — CULTURE, BLOOD (ROUTINE X 2)
Culture: NO GROWTH
Culture: NO GROWTH
Special Requests: ADEQUATE

## 2021-11-06 LAB — BASIC METABOLIC PANEL
Anion gap: 6 (ref 5–15)
BUN: 22 mg/dL (ref 8–23)
CO2: 23 mmol/L (ref 22–32)
Calcium: 7.7 mg/dL — ABNORMAL LOW (ref 8.9–10.3)
Chloride: 107 mmol/L (ref 98–111)
Creatinine, Ser: 0.76 mg/dL (ref 0.61–1.24)
GFR, Estimated: >60 mL/min (ref >60)
Glucose, Bld: 234 mg/dL — ABNORMAL HIGH (ref 70–99)
Potassium: 3.7 mmol/L (ref 3.5–5.1)
Sodium: 136 mmol/L (ref 135–145)

## 2021-11-06 LAB — CBC
HCT: 18.7 % — ABNORMAL LOW (ref 39.0–52.0)
Hemoglobin: 6.2 g/dL — ABNORMAL LOW (ref 13.0–17.0)
MCH: 27.6 pg (ref 26.0–34.0)
MCHC: 33.2 g/dL (ref 30.0–36.0)
MCV: 83.1 fL (ref 80.0–100.0)
Platelets: 276 10*3/uL (ref 150–400)
RBC: 2.25 MIL/uL — ABNORMAL LOW (ref 4.22–5.81)
RDW: 13.9 % (ref 11.5–15.5)
WBC: 16.8 10*3/uL — ABNORMAL HIGH (ref 4.0–10.5)
nRBC: 0.3 % — ABNORMAL HIGH (ref 0.0–0.2)

## 2021-11-06 LAB — GLUCOSE, CAPILLARY
Glucose-Capillary: 242 mg/dL — ABNORMAL HIGH (ref 70–99)
Glucose-Capillary: 191 mg/dL — ABNORMAL HIGH (ref 70–99)
Glucose-Capillary: 79 mg/dL (ref 70–99)
Glucose-Capillary: 158 mg/dL — ABNORMAL HIGH (ref 70–99)
Glucose-Capillary: 162 mg/dL — ABNORMAL HIGH (ref 70–99)
Glucose-Capillary: 189 mg/dL — ABNORMAL HIGH (ref 70–99)

## 2021-11-06 LAB — PROCALCITONIN: Procalcitonin: 0.2 ng/mL

## 2021-11-06 LAB — HEMOGLOBIN AND HEMATOCRIT, BLOOD
HCT: 17.7 % — ABNORMAL LOW (ref 39.0–52.0)
Hemoglobin: 5.8 g/dL — ABNORMAL LOW (ref 13.0–17.0)

## 2021-11-06 LAB — MAGNESIUM: Magnesium: 2 mg/dL (ref 1.7–2.4)

## 2021-11-06 LAB — PHOSPHORUS: Phosphorus: 1.7 mg/dL — ABNORMAL LOW (ref 2.5–4.6)

## 2021-11-06 LAB — PREPARE RBC (CROSSMATCH)

## 2021-11-06 LAB — ABO/RH: ABO/RH(D): B POS

## 2021-11-06 MED ORDER — POLYETHYLENE GLYCOL 3350 17 G PO PACK: 17.0000 g | PACK | Freq: Every day | ORAL | Status: DC | PRN

## 2021-11-06 MED ORDER — SODIUM CHLORIDE 0.9% IV SOLUTION: Freq: Once | INTRAVENOUS | Status: AC

## 2021-11-06 MED ORDER — DOCUSATE SODIUM 50 MG/5ML PO LIQD
100.0000 mg | Freq: Two times a day (BID) | ORAL | Status: DC | PRN
  Filled 2021-11-06: qty 10

## 2021-11-06 MED ORDER — CEFAZOLIN SODIUM-DEXTROSE 2-4 GM/100ML-% IV SOLN
2.0000 g | Freq: Three times a day (TID) | INTRAVENOUS | Status: DC
  Administered 2021-11-06 – 2021-11-07 (×2): 2 g via INTRAVENOUS
  Filled 2021-11-06 (×3): qty 100

## 2021-11-06 NOTE — IPAL (Signed)
?  Interdisciplinary Goals of Care Family Meeting ? ? ?Date carried out: 11/06/2021 ? ?Location of the meeting: Bedside ? ?Member's involved: Physician and Family Member or next of kin ? ?Code status: Full DNR ? ?Disposition: Continue current acute care ? ? ? ? ? ?GOALS OF CARE DISCUSSION ? ?The Clinical status was relayed to family in detail- ? ?Updated and notified of patients medical condition- ?Patient able to open eyes when calling his name ?Able to move RT arm and leg, plegia of left side ? ?Patient with increased WOB and using accessory muscles to breathe ?Explained to family course of therapy and the modalities  ?Remains on vent ? ?Patient with Progressive multiorgan failure with a very high probablity of a very minimal chance of meaningful recovery despite all aggressive and optimal medical therapy.  ? ?Family understands the situation-family asking for more time ? ?They have consented and agreed to DNR status ? ?Family are satisfied with Plan of action and management. All questions answered ? ?Additional CC time 25 mins ? ? ?Corrin Parker, M.D.  ?[facility]  ?Medical Director [place] ?Medical Director [facility] Cardio-Pulmonary Department  ? ? ? ? ?

## 2021-11-06 NOTE — Consult Note (Signed)
PHARMACY CONSULT NOTE ? ?Pharmacy Consult for Electrolyte Monitoring and Replacement  ? ?Recent Labs: ?Potassium (mmol/L)  ?Date Value  ?11/06/2021 3.7  ? ?Magnesium (mg/dL)  ?Date Value  ?11/06/2021 2.0  ? ?Calcium (mg/dL)  ?Date Value  ?11/06/2021 7.7 (L)  ? ?Albumin (g/dL)  ?Date Value  ?11/01/2021 3.8  ? ?Phosphorus (mg/dL)  ?Date Value  ?11/06/2021 1.7 (L)  ? ?Sodium (mmol/L)  ?Date Value  ?11/06/2021 136  ? ?Assessment: ?Patient is a 88 y/o M with medical history including no pertinent past medical history on file who is admitted after fall complicated by large subdural hematoma. Patient is status post tracheostomy on 4/29 and craniotomy with hematoma evacuation on 5/1. Patient is admitted to the ICU where he remains intubated, sedated, and on mechanical ventilation. Pharmacy consulted to assist with electrolyte monitoring and replacement as indicated. ? ?Nutrition: Tube feeds started 5/1 ? ?Goal of Therapy:  ?Electrolytes within normal limits ? ?Plan:  ?--Phos 1.6, Phos-Nak TIDHS scheduled for now ?--No additional replacement warranted today ?--Follow-up electrolytes with AM labs tomorrow ? ?Jaynie Bream, PharmD ?Pharmacy Resident  ?11/06/2021 ?8:51 AM ?

## 2021-11-06 NOTE — Progress Notes (Signed)
Neurology Progress Note ? ? ?S:// ?Seen and examined this morning. ?No overnight changes ? ? ?O:// ?Current vital signs: ?BP 129/70   Pulse 94   Temp (!) 100.6 ?F (38.1 ?C)   Resp 18   Ht $R'5\' 2"'er$  (1.575 m)   Wt 59.3 kg   SpO2 100%   BMI 23.91 kg/m?  ?Vital signs in last 24 hours: ?Temp:  [99.3 ?F (37.4 ?C)-101.1 ?F (38.4 ?C)] 100.6 ?F (38.1 ?C) (05/03 0600) ?Pulse Rate:  [84-104] 94 (05/03 0600) ?Resp:  [18-23] 18 (05/03 0600) ?BP: (117-159)/(58-91) 129/70 (05/03 0600) ?SpO2:  [100 %] 100 % (05/03 0600) ?FiO2 (%):  [28 %] 28 % (05/03 0934) ?General: Trached, no sedation ?HEENT: Bandages in place from the craniotomy ?CVS: Regular rhythm ?Respiratory: Trach ?Neurological exam ?Patient is on no sedation ?He is trached ?I did not notice any spontaneous movements ?Spontaneous eye opening today as well as question some attempt to track the examiner but that was not consistent. ?Nonverbal ?Question if he did follow some commands to raise the right arm but again inconsistently and not every time that I asked. ?Cranial nerves: Pupils are irregular and asymmetric with the right pupil about 7 mm nonreactive and left pupil 6 mm irregular and nonreactive to light.  Corneal reflexes are present.  Initially it seemed like he had a down and left gaze but that quickly changed to rightward gaze and then to midline gaze-I suspect that he might be having roving eye movements. ?He does not blink to threat from either side.  Asymmetric facies with left facial droop at rest.. ?Motor examination: Flaccid left upper extremity.  Minimal movement to noxious stimulation in the left lower extremity.  Strong withdrawal to noxious stimulation in the right lower extremity.  There is some withdrawal to noxious stimulation in the right upper extremity as well and today it did seem like on passively moving his right arm up he withdrew it away from the and then tried to raise it to command. ?Sensory exam: As above ?Coordination cannot be assessed  given his mentation ? ?Medications ? ?Current Facility-Administered Medications:  ?  0.9 %  sodium chloride infusion, 250 mL, Intravenous, PRN, Mortimer Fries, Kurian, MD ?  0.9 %  sodium chloride infusion, 250 mL, Intravenous, Continuous, Darel Hong D, NP, Last Rate: 10 mL/hr at 11/04/21 1900, Infusion Verify at 11/04/21 1900 ?  acetaminophen (TYLENOL) suppository 650 mg, 650 mg, Rectal, Q6H PRN, Teressa Lower, NP, 650 mg at 11/04/21 2338 ?  acetaminophen (TYLENOL) tablet 650 mg, 650 mg, Per Tube, Q4H PRN, Rust-Chester, Huel Cote, NP, 650 mg at 11/05/21 1851 ?  ceFEPIme (MAXIPIME) 2 g in sodium chloride 0.9 % 100 mL IVPB, 2 g, Intravenous, Q12H, Teressa Lower, NP, Last Rate: 200 mL/hr at 11/06/21 0833, Infusion Verify at 11/06/21 3559 ?  chlorhexidine gluconate (MEDLINE KIT) (PERIDEX) 0.12 % solution 15 mL, 15 mL, Mouth Rinse, BID, Kasa, Kurian, MD, 15 mL at 11/06/21 0818 ?  Chlorhexidine Gluconate Cloth 2 % PADS 6 each, 6 each, Topical, Q0600, Flora Lipps, MD, 6 each at 11/06/21 0819 ?  dextrose 50 % solution 0-50 mL, 0-50 mL, Intravenous, PRN, Lavonia Drafts, MD ?  docusate (COLACE) 50 MG/5ML liquid 100 mg, 100 mg, Per Tube, BID, Rust-Chester, Britton L, NP, 100 mg at 11/05/21 0919 ?  feeding supplement (VITAL AF 1.2 CAL) liquid 1,000 mL, 1,000 mL, Per Tube, Continuous, Kasa, Kurian, MD, Last Rate: 55 mL/hr at 11/05/21 2300, 1,000 mL at 11/05/21 2300 ?  fentaNYL (  SUBLIMAZE) injection 25-100 mcg, 25-100 mcg, Intravenous, Q30 min PRN, Rust-Chester, Britton L, NP, 100 mcg at 11/02/21 0132 ?  free water 30 mL, 30 mL, Per Tube, Q4H, Flora Lipps, MD, 30 mL at 11/06/21 0819 ?  insulin aspart (novoLOG) injection 0-9 Units, 0-9 Units, Subcutaneous, Q4H, Rust-Chester, Huel Cote, NP, 2 Units at 11/06/21 0817 ?  insulin detemir (LEVEMIR) injection 14 Units, 0.25 Units/kg, Subcutaneous, Q24H, Tyler Pita, MD, 14 Units at 11/06/21 0010 ?  MEDLINE mouth rinse, 15 mL, Mouth Rinse, 10 times per day, Flora Lipps, MD, 15 mL  at 11/06/21 0426 ?  ondansetron (ZOFRAN) injection 4 mg, 4 mg, Intravenous, Q6H PRN, Flora Lipps, MD ?  pantoprazole sodium (PROTONIX) 40 mg/20 mL oral suspension 40 mg, 40 mg, Per Tube, Daily, Rust-Chester, Britton L, NP, 40 mg at 11/06/21 0817 ?  polyethylene glycol (MIRALAX / GLYCOLAX) packet 17 g, 17 g, Per Tube, Daily, Rust-Chester, Britton L, NP, 17 g at 11/05/21 0919 ?  potassium & sodium phosphates (PHOS-NAK) 280-160-250 MG packet 1 packet, 1 packet, Per Tube, TID WC & HS, Benita Gutter, RPH, 1 packet at 11/06/21 6546 ?  sodium chloride flush (NS) 0.9 % injection 3 mL, 3 mL, Intravenous, Q12H, Kasa, Maretta Bees, MD, 3 mL at 11/06/21 0819 ?  sodium chloride flush (NS) 0.9 % injection 3 mL, 3 mL, Intravenous, PRN, Flora Lipps, MD ?  [COMPLETED] vancomycin (VANCOREADY) IVPB 1500 mg/300 mL, 1,500 mg, Intravenous, Once, Last Rate: 150 mL/hr at 11/04/21 1900, Infusion Verify at 11/04/21 1900 **FOLLOWED BY** vancomycin (VANCOCIN) IVPB 1000 mg/200 mL premix, 1,000 mg, Intravenous, Q24H, Dorothe Pea, RPH, Last Rate: 200 mL/hr at 11/05/21 2051, 1,000 mg at 11/05/21 2051 ?Labs ?CBC ?   ?Component Value Date/Time  ? WBC 30.8 (H) 11/04/2021 0315  ? RBC 2.47 (L) 11/04/2021 0315  ? HGB 7.0 (L) 11/04/2021 0315  ? HCT 21.0 (L) 11/04/2021 0315  ? PLT 218 11/04/2021 0315  ? MCV 85.0 11/04/2021 0315  ? MCH 28.3 11/04/2021 0315  ? MCHC 33.3 11/04/2021 0315  ? RDW 13.5 11/04/2021 0315  ? ? ?CMP  ?   ?Component Value Date/Time  ? NA 136 11/06/2021 0334  ? K 3.7 11/06/2021 0334  ? CL 107 11/06/2021 0334  ? CO2 23 11/06/2021 0334  ? GLUCOSE 234 (H) 11/06/2021 0334  ? BUN 22 11/06/2021 0334  ? CREATININE 0.76 11/06/2021 0334  ? CALCIUM 7.7 (L) 11/06/2021 0334  ? PROT 8.5 (H) 11/01/2021 1354  ? ALBUMIN 3.8 11/01/2021 1354  ? AST 34 11/01/2021 1354  ? ALT 18 11/01/2021 1354  ? ALKPHOS 100 11/01/2021 1354  ? BILITOT 0.5 11/01/2021 1354  ? GFRNONAA >60 11/06/2021 0334  ? ?EEG yesterday: Suggestive of cortical dysfunction in the  right hemisphere as well as moderate to severe diffuse encephalopathy of nonspecific etiology.  No seizures ? ?Imaging ?I have reviewed images in epic and the results pertinent to this consultation are: ?CT head yesterday with slight improvement in the size of right frontoparietal subdural hematoma status postevacuation with slight improvement in the leftward midline shift which is now 5 mm.  The thickness of the subdural diminished from 2.1 cm to 1.8 cm. ? ?Assessment:  ?88 year old with a large right-sided subdural hematoma status post craniotomy evacuation who continues to be unresponsive, on vent via tracheostomy in spite of being on no sedation for the last couple of days. ?Given his advanced age, large sided subdural, and possible poor neurological baseline, his recovery from a neurologically meaningful  standpoint might not be back to his baseline prior to the event. ?Detailed family discussion yesterday. ? ?Impression: ?Encephalopathy due to brainstem compression from a large subdural hematoma. ? ? ?Recommendations: ?Supportive care per ICU ?Postop care per neurosurgery including blood pressure goals set by neurosurgery. ?No antiplatelets or anticoagulants ?I will follow him along with you and keep updating the family.  They have been extremely worried about him and have been disheartened by not having been provided a lot of hope for his recovery, which I explained to them is reasonable given all the comorbidities and advanced age.  They did seem to understand what I explained to them especially when I spoke to them in Hindi, language that both the family and I share. ?I discussed my plan in detail with Dr. Mortimer Fries ? ?-- ?Amie Portland, MD ?Neurologist ?[facility] ?Pager: [phone] ? ?

## 2021-11-06 NOTE — Progress Notes (Signed)
? ?NAME:  Demarrio Menges, MRN:  [id number], DOB:  1925-09-05, LOS: 5 ?ADMISSION DATE:  11/01/2021, CONSULTATION DATE:  11/01/2021 ?REFERRING MD: Dr. Corky Downs, CHIEF COMPLAINT:  Unresponsive  ? ?Brief Pt Description / Synopsis:  ?88 y.o. Male admitted with Acute Metabolic Encephalopathy in setting of large subdural hematoma with associated mass effect with left midline shift.  Pt deemed not a surgical candidate by Neurosurgery.  Pt also found to have DKA. ? ?History of Present Illness:  ?Breyson Kelm is a 88 year old male with unknown past medical history who presents to [facility] ED on 11/01/2021 due to unresponsiveness. Pt is currently intubated and unable to provide history and no family is currently available, therefore history is obtained from chart review. ? ?EMS reported the patient had a fall off the toilet, apparently hit his head, and was unresponsive.  EMS noted the patient to be posturing with them. ? ?Shortly after arrival to the ED he was intubated for airway protection due to GCS score of 3.  Patient remained unresponsive with fixed pupils, along with decorticate posturing noted on the right. ? ?ED Course: ?Initial Vital Signs: Temperature 92 Fahrenheit, pulse 95, respiratory rate 18, BP 158/103, SPO2 100% ?Significant Labs: Sodium 134, bicarb 14, glucose 442, BUN 31, creatinine 1.1, anion gap 19, high-sensitivity troponin 6, lactic acid 8.6, WBC 19.4, PT 14.8, INR 1.2, beta hydroxybutyric acid 1.78 ?Urinalysis negative for UTI ?Urine drug screen negative ?Imaging ?Chest X-ray>>FINDINGS: ?Interval placement of ET tube with tip positioned approximately 2.0 ?cm from the carina. Enteric tube coursing below the diaphragm. ?Cardiac and mediastinal contours are unchanged. Mild bibasilar ?atelectasis. Trace right pleural effusion. ?CT head without contrast>>IMPRESSION: ?1. Increased size of a now large (2 cm thick) acute right cerebral ?convexity subdural hemorrhage. Significant associated mass effect ?with 1.4 cm of  leftward midline shift. Recommend urgent ?neurosurgical consultation. ?2. Rounding of the left temporal horn, compatible with ventricular ?entrapment. ?3. 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 ?Medications Administered: 1 L normal saline bolus, cefepime, Flagyl, vancomycin, insulin drip ? ?ED provider discussed with Dr. Lacinda Axon of Neurosurgery regarding CT head showing large subdural hematoma.  Neurosurgery deemed that patient was not a surgical candidate.  He was given IV fluids along with broad-spectrum antibiotics including IV cefepime, Flagyl, vancomycin as he met SIRS criteria.  He was also placed on IV insulin drip due to meeting DKA criteria. ? ?PCCM is asked to admit the patient to ICU for further work-up and treatment. ? ?Pertinent  Medical History  ?No past medical history on file. ? ? ?Micro Data:  ?4/28: SARS-CoV-2 and influenza PCR>>negative ?4/28: Blood culture x2>> ?5/01: Lurline Idol cult>> ? ?Antimicrobials:  ?Cefepime 4/28>> ?Vancomycin 4/28>> ?Flagyl 4/28 x 1 dose ? ?Significant Hospital Events: ?Including procedures, antibiotic start and stop dates in addition to other pertinent events   ?4/28: Presented to ER unresponsive, fixed pupils, posturing.  Intubated for airway protection.  Found to have large subdural hematoma with mass effect and left shift.  Deemed not a surgical candidate by neurosurgery.  Recommended comfort care, however family wants all aggressive measures.  PCCM asked to admit ?4/29 remains encephalopathic, poor prognosis ?4/30 s/p CRANIOTOMY AND s/p TRACH ?5/01 CNS drain removed by neurosurg ?5/02 Dr. Rory Percy met with family, pt remains encephalopathic ?5/3 remains encephalopathic, severe brain damage ? ?Interim History / Subjective:  ?Remains on vent ?Remains encephalopathic ?+brian damage ?-Occ will move RLE ?DUBHOFF IN PLACE ?STARTED TF's ? ?Objective   ?Blood pressure 129/70, pulse 94, temperature (!)  100.6 ?F (38.1 ?C), resp.  rate 18, height _0  (1.575 m), weight 59.3 kg, SpO2 100 %. ?   ?Vent Mode: PRVC ?FiO2 (%):  [28 %] 28 % ?Set Rate:  [18 bmp] 18 bmp ?Vt Set:  [500 mL] 500 mL ?PEEP:  [5 cmH20] 5 cmH20 ?Plateau Pressure:  [16 cmH20] 16 cmH20  ? ?Intake/Output Summary (Last 24 hours) at 11/06/2021 0755 ?Last data filed at 11/06/2021 0600 ?Gross per 24 hour  ?Intake 1896.8 ml  ?Output 1550 ml  ?Net 346.8 ml  ? ? ?Filed Weights  ? 11/03/21 0500 11/04/21 0357 11/05/21 0343  ?Weight: 57.2 kg 61.7 kg 59.3 kg  ? ? ?REVIEW OF SYSTEMS ? ?PATIENT IS UNABLE TO PROVIDE COMPLETE REVIEW OF SYSTEMS DUE TO SEVERE CRITICAL ILLNESS AND TOXIC METABOLIC ENCEPHALOPATHY ? ? ? ?PHYSICAL EXAMINATION: ? ?GENERAL:critically ill appearing, S/P crani for SDH evac, dressing intact ?EYES: Pupils equal, round, reactive to light.  No scleral icterus.  ?MOUTH: Moist mucosal membrane. S/p trach ?NECK: Supple. S/p trach ?PULMONARY: +rhonchi, +wheezing ?CARDIOVASCULAR: S1 and S2.  No murmurs  ?GASTROINTESTINAL: Soft, nontender, -distended. Positive bowel sounds.  ?MUSCULOSKELETAL: No swelling, clubbing, or edema.  ?NEUROLOGIC: On no sedatives, does have positive cough/gag reflex, withdraws all extremities to pain, pupils fixed and unequal (right 5 mm, left 3 mm) ?Vegetative state this AM ?SKIN:intact,warm,dry ? ? ? ?SDH evacuated 4/30 ?No significant NEURO Recovery 3 days post  op ? ?Assessment & Plan:  ? ?6 o [place] male with Acute Encephalopathy in setting of Large Subdural Hematoma with associated mass effect and left midline shift ?Intubated for airway protection +severe brain damage ? ?Severe ACUTE Hypoxic and Hypercapnic Respiratory Failure ?-Avoid sedating medications as able ?-continue Mechanical Ventilator support ?-Wean Fio2 and PEEP as tolerated ?-VAP/VENT bundle implementation ?-Frequent Neuro checks ?-Goal SBP <140 ?-NO antiplatelets or anticoagulants ?-Implement measures to reduce ICP: HOB >30 degrees, prevent Hypoxia and Hypotension ?-Currently exhibiting  no signs of seizures, will hold off on empiric AED's for now ?- Neurology consulting, discussed with Dr. Rory Percy, appreciate input ?- Intermittent chest x-ray & ABG PRN ?- Ensure adequate pulmonary hygiene  ?- PRN bronchodilators ?- Currently on antibiotics ? ? ? ? ?DKA - resolved ? ?INFECTIOUS DISEASE ?-continue antibiotics as prescribed ?-follow up cultures ?Meets SIRS Criteria at admission  ?Pneumonia versus tracheitis, cultures pending ? ? ?ENDO ?- ICU hypoglycemic\Hyperglycemia protocol ?-check FSBS per protocol ? ? ?GI ?GI PROPHYLAXIS as indicated ? ?NUTRITIONAL STATUS ?DIET-->TF's as tolerated ?Constipation protocol as indicated ? ? ?ELECTROLYTES ?-follow labs as needed ?-replace as needed ?-pharmacy consultation and following ? ? ? ? ? ?Best Practice (right click and "Reselect all SmartList Selections" daily)  ? ?Diet/type: NPO ?DVT prophylaxis: SCD ?GI prophylaxis: PPI ?Lines: N/A ?Foley:  N/A ?Code Status: DNR ? ? ? ?Labs   ?CBC: ?Recent Labs  ?Lab 11/01/21 ?1354 11/02/21 ?0730 11/03/21 ?0400 11/04/21 ?0315  ?WBC 19.4* 17.9* 31.5* 30.8*  ?HGB 14.0 11.7* 8.2* 7.0*  ?HCT 43.9 34.4* 24.2* 21.0*  ?MCV 87.8 83.1 83.7 85.0  ?PLT 336 209 243 218  ? ? ? ?Basic Metabolic Panel: ?Recent Labs  ?Lab 11/02/21 ?8264 11/03/21 ?0400 11/04/21 ?1583 11/05/21 ?0940 11/06/21 ?7680  ?NA 135 129* 132* 133* 136  ?K 4.1 4.1 4.0 3.2* 3.7  ?CL 103 105 103 105 107  ?CO2 21* 19* 21* 22 23  ?GLUCOSE 100* 193* 117* 159* 234*  ?BUN _1 ?CREATININE 0.83 0.94 0.78 0.79 0.76  ?CALCIUM 8.4* 7.0* 7.5* 7.6* 7.7*  ?  MG 2.3 1.7 1.9 1.9 2.0  ?PHOS 4.4 2.8 2.3* 1.6* 1.7*  ? ? ?GFR: ?Estimated Creatinine Clearance: 44.6 mL/min (by C-G formula based on SCr of 0.76 mg/dL). ?Recent Labs  ?Lab 11/01/21 ?1354 11/01/21 ?1800 11/01/21 ?2023 11/02/21 ?0641 11/02/21 ?0730 11/03/21 ?0400 11/04/21 ?0315  ?PROCALCITON  --  0.58  --  0.88  --  0.90  --   ?WBC 19.4*  --   --   --  17.9* 31.5* 30.8*  ?LATICACIDVEN 8.6*  --  3.6* 2.5*  --   --   --    ? ? ? ?Liver Function Tests: ?Recent Labs  ?Lab 11/01/21 ?1354  ?AST 34  ?ALT 18  ?ALKPHOS 100  ?BILITOT 0.5  ?PROT 8.5*  ?ALBUMIN 3.8  ? ? ?No results for input(s): LIPASE, AMYLASE in the last 168 hours. ?N

## 2021-11-07 DIAGNOSIS — G939 Disorder of brain, unspecified: Secondary | ICD-10-CM | POA: Diagnosis not present

## 2021-11-07 LAB — GLUCOSE, CAPILLARY
Glucose-Capillary: 201 mg/dL — ABNORMAL HIGH (ref 70–99)
Glucose-Capillary: 215 mg/dL — ABNORMAL HIGH (ref 70–99)
Glucose-Capillary: 189 mg/dL — ABNORMAL HIGH (ref 70–99)
Glucose-Capillary: 185 mg/dL — ABNORMAL HIGH (ref 70–99)
Glucose-Capillary: 176 mg/dL — ABNORMAL HIGH (ref 70–99)

## 2021-11-07 LAB — CULTURE, RESPIRATORY W GRAM STAIN: Culture: NORMAL

## 2021-11-07 LAB — PHOSPHORUS: Phosphorus: 2.3 mg/dL — ABNORMAL LOW (ref 2.5–4.6)

## 2021-11-07 LAB — CBC
HCT: 27.5 % — ABNORMAL LOW (ref 39.0–52.0)
Hemoglobin: 9.3 g/dL — ABNORMAL LOW (ref 13.0–17.0)
MCH: 28.4 pg (ref 26.0–34.0)
MCHC: 33.8 g/dL (ref 30.0–36.0)
MCV: 83.8 fL (ref 80.0–100.0)
Platelets: 284 10*3/uL (ref 150–400)
RBC: 3.28 MIL/uL — ABNORMAL LOW (ref 4.22–5.81)
RDW: 14.1 % (ref 11.5–15.5)
WBC: 15.7 10*3/uL — ABNORMAL HIGH (ref 4.0–10.5)
nRBC: 0.4 % — ABNORMAL HIGH (ref 0.0–0.2)

## 2021-11-07 LAB — BASIC METABOLIC PANEL
Anion gap: 7 (ref 5–15)
BUN: 21 mg/dL (ref 8–23)
CO2: 24 mmol/L (ref 22–32)
Calcium: 7.4 mg/dL — ABNORMAL LOW (ref 8.9–10.3)
Chloride: 104 mmol/L (ref 98–111)
Creatinine, Ser: 0.66 mg/dL (ref 0.61–1.24)
GFR, Estimated: >60 mL/min (ref >60)
Glucose, Bld: 234 mg/dL — ABNORMAL HIGH (ref 70–99)
Potassium: 3.4 mmol/L — ABNORMAL LOW (ref 3.5–5.1)
Sodium: 135 mmol/L (ref 135–145)

## 2021-11-07 LAB — MAGNESIUM: Magnesium: 1.8 mg/dL (ref 1.7–2.4)

## 2021-11-07 MED ORDER — POTASSIUM CHLORIDE 20 MEQ PO PACK
40.0000 meq | PACK | Freq: Once | ORAL | Status: AC
  Administered 2021-11-07: 40 meq
  Filled 2021-11-07: qty 2

## 2021-11-07 MED ORDER — MAGNESIUM SULFATE 2 GM/50ML IV SOLN
2.0000 g | Freq: Once | INTRAVENOUS | Status: AC
  Administered 2021-11-07: 2 g via INTRAVENOUS
  Filled 2021-11-07: qty 50

## 2021-11-07 NOTE — Consult Note (Addendum)
PHARMACY CONSULT NOTE ? ?Pharmacy Consult for Electrolyte Monitoring and Replacement  ? ?Recent Labs: ?Potassium (mmol/L)  ?Date Value  ?11/07/2021 3.4 (L)  ? ?Magnesium (mg/dL)  ?Date Value  ?11/07/2021 1.8  ? ?Calcium (mg/dL)  ?Date Value  ?11/07/2021 7.4 (L)  ? ?Albumin (g/dL)  ?Date Value  ?11/01/2021 3.8  ? ?Phosphorus (mg/dL)  ?Date Value  ?11/07/2021 2.3 (L)  ? ?Sodium (mmol/L)  ?Date Value  ?11/07/2021 135  ? ?Assessment: ?Patient is a 88 y/o M with medical history including no pertinent past medical history on file who is admitted after fall complicated by large subdural hematoma. Patient is status post tracheostomy on 4/29 and craniotomy with hematoma evacuation on 5/1. Patient is admitted to the ICU where he remains intubated, sedated, and on mechanical ventilation. Pharmacy consulted to assist with electrolyte monitoring and replacement as indicated. ? ?Nutrition: Tube feeds started 5/1 ? ?Goal of Therapy:  ?Electrolytes within normal limits ? ?Plan:  ?--K 3.4, Kcl 40 mEq per tube x 1 ?--Mg 1.8, IV magnesium sulfate 2 g x 1 ?--Phos 2.4, Phos-Nak TIDHS; stop after lunchtime dose. May be contributing to diarrhea ?--Follow-up electrolytes with AM labs tomorrow ? ?Benita Gutter ?11/07/2021 ?9:49 AM ?

## 2021-11-07 NOTE — Progress Notes (Signed)
Patient briefly seen in the ICU today. ?Today with eyes open trying to track the examiner. ?Remains plegic on the left side. ?Upon asking him to raise his right arm, he did not but when I passively held it up and asked him to keep it up there, he was able to hold her arm up for a couple of seconds. ?Did not move the right leg much. ?Management as per primary team with supportive care and blood pressure management. ?I will be available to speak with the family as needed to support the primary team. ?Plan discussed with Harlon Ditty the ICU ? ?-- ?Milon Dikes, MD ?Neurologist ?[facility] ?Pager: [phone] ? ? ? ?No charge note ? ?

## 2021-11-07 NOTE — Progress Notes (Signed)
? ?NAME:  Christopher [name], MRN:  [id number], DOB:  Jul 29, 1925, LOS: 6 ?ADMISSION DATE:  11/01/2021, CONSULTATION DATE:  11/01/2021 ?REFERRING MD: Dr. Corky Downs, CHIEF COMPLAINT:  Unresponsive  ? ?Brief Pt Description / Synopsis:  ?88 y.o. Male admitted with Acute Metabolic Encephalopathy in setting of large subdural hematoma with associated mass effect with left midline shift.  Pt deemed not a surgical candidate by Neurosurgery.  Pt also found to have DKA. ? ?History of Present Illness:  ?Christopher [name] is a 88 year old male with unknown past medical history who presents to [facility] ED on 11/01/2021 due to unresponsiveness. Pt is currently intubated and unable to provide history and no family is currently available, therefore history is obtained from chart review. ? ?EMS reported the patient had a fall off the toilet, apparently hit his head, and was unresponsive.  EMS noted the patient to be posturing with them. ? ?Shortly after arrival to the ED he was intubated for airway protection due to GCS score of 3.  Patient remained unresponsive with fixed pupils, along with decorticate posturing noted on the right. ? ?ED Course: ?Initial Vital Signs: Temperature 92 Fahrenheit, pulse 95, respiratory rate 18, BP 158/103, SPO2 100% ?Significant Labs: Sodium 134, bicarb 14, glucose 442, BUN 31, creatinine 1.1, anion gap 19, high-sensitivity troponin 6, lactic acid 8.6, WBC 19.4, PT 14.8, INR 1.2, beta hydroxybutyric acid 1.78 ?Urinalysis negative for UTI ?Urine drug screen negative ?Imaging ?Chest X-ray>>FINDINGS: ?Interval placement of ET tube with tip positioned approximately 2.0 ?cm from the carina. Enteric tube coursing below the diaphragm. ?Cardiac and mediastinal contours are unchanged. Mild bibasilar ?atelectasis. Trace right pleural effusion. ?CT head without contrast>>IMPRESSION: ?1. Increased size of a now large (2 cm thick) acute right cerebral ?convexity subdural hemorrhage. Significant associated mass effect ?with 1.4 cm of  leftward midline shift. Recommend urgent ?neurosurgical consultation. ?2. Rounding of the left temporal horn, compatible with ventricular ?entrapment. ?3. 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 ?Medications Administered: 1 L normal saline bolus, cefepime, Flagyl, vancomycin, insulin drip ? ?ED provider discussed with Dr. Lacinda Axon of Neurosurgery regarding CT head showing large subdural hematoma.  Neurosurgery deemed that patient was not a surgical candidate.  He was given IV fluids along with broad-spectrum antibiotics including IV cefepime, Flagyl, vancomycin as he met SIRS criteria.  He was also placed on IV insulin drip due to meeting DKA criteria. ? ?PCCM is asked to admit the patient to ICU for further work-up and treatment. ? ?Pertinent  Medical History  ?No past medical history on file. ? ? ?Micro Data:  ?4/28: SARS-CoV-2 and influenza PCR>>negative ?4/28: Blood culture x2>> ?5/01: Lurline Idol cult>> ? ?Antimicrobials:  ?Cefepime 4/28>> ?Vancomycin 4/28>> ?Flagyl 4/28 x 1 dose ? ?Significant Hospital Events: ?Including procedures, antibiotic start and stop dates in addition to other pertinent events   ?4/28: Presented to ER unresponsive, fixed pupils, posturing.  Intubated for airway protection.  Found to have large subdural hematoma with mass effect and left shift.  Deemed not a surgical candidate by neurosurgery.  Recommended comfort care, however family wants all aggressive measures.  PCCM asked to admit ?4/29 remains encephalopathic, poor prognosis ?4/30 s/p CRANIOTOMY AND s/p TRACH ?5/01 CNS drain removed by neurosurg ?5/02 Dr. Rory Percy met with family, pt remains encephalopathic ?5/3 remains encephalopathic, severe brain damage ?5/4 left sided plegia, follows commands with family, severe brain damage ? ?Interim History / Subjective:  ?Failure to wean from vent ?Failed PS trial last night placed back on FULL SUPPORT ?+brian  damage ?DUBHOFF IN PLACE ?STARTED  TF's ? ?Objective   ?Blood pressure (!) 122/59, pulse 76, temperature 100.2 ?F (37.9 ?C), resp. rate 16, height $RemoveBe'5\' 2"'EfElBUoqw$  (1.575 m), weight 62.8 kg, SpO2 100 %. ?   ?Vent Mode: PRVC ?FiO2 (%):  [18 %-28 %] 18 % ?Set Rate:  [18 bmp] 18 bmp ?Vt Set:  [500 mL] 500 mL ?PEEP:  [5 cmH20] 5 cmH20 ?Pressure Support:  [15 cmH20] 15 cmH20  ? ?Intake/Output Summary (Last 24 hours) at 11/07/2021 0739 ?Last data filed at 11/07/2021 0600 ?Gross per 24 hour  ?Intake 2054.65 ml  ?Output 1660 ml  ?Net 394.65 ml  ? ? ?Filed Weights  ? 11/04/21 0357 11/05/21 0343 11/07/21 0500  ?Weight: 61.7 kg 59.3 kg 62.8 kg  ? ? ?REVIEW OF SYSTEMS ? ?PATIENT IS UNABLE TO PROVIDE COMPLETE REVIEW OF SYSTEMS DUE TO SEVERE CRITICAL ILLNESS AND TOXIC METABOLIC ENCEPHALOPATHY ? ? ? ?PHYSICAL EXAMINATION: ? ?GENERAL:critically ill appearing, +resp distress ?EYES: Pupils equal, round, reactive to light.  No scleral icterus.  ?MOUTH: Moist mucosal membrane. S/p TRACH ?NECK: Supple.  ?PULMONARY: +rhonchi, +wheezing ?CARDIOVASCULAR: S1 and S2.  No murmurs  ?GASTROINTESTINAL: Soft, nontender, -distended. Positive bowel sounds.  ?MUSCULOSKELETAL: No swelling, clubbing, or edema.  ?NEUROLOGIC:  ?On no sedatives, does have positive cough/gag reflex, withdraws all extremities to pain, pupils fixed and unequal (right 5 mm, left 3 mm) ?Intermittently follows commands RT side movement with commands, Left side hemiplegia ? ? ?SDH evacuated 4/30 ?No significant NEURO Recovery 4 days post  op ? ?Assessment & Plan:  ? ?14 o [place] male with Acute Encephalopathy in setting of TRAUMATIC Large Subdural Hematoma with associated mass effect and left midline shift ?Intubated for airway protection +severe brain damage ? ?Severe ACUTE Hypoxic and Hypercapnic Respiratory Failure ?-continue Mechanical Ventilator support ?-Wean Fio2 and PEEP as tolerated ?-VAP/VENT bundle implementation ?- Wean PEEP & FiO2 as tolerated, maintain SpO2 > 88% ?- Head of bed elevated 30 degrees, VAP protocol  in place ?- Plateau pressures less than 30 cm H20  ?- Intermittent chest x-ray & ABG PRN ?- Ensure adequate pulmonary hygiene  ?-will perform SBT when respiratory parameters are met ? ? ?NEUROLOGY ?LARGE SUBDURAL BLEED WITH BRAIN DAMAGE ?-continue Mechanical Ventilator support ?-Frequent Neuro checks ?-Goal SBP <140 ?+anticoagulants as needed ?-Implement measures to reduce ICP: HOB >30 degrees, prevent Hypoxia and Hypotension ?-Currently exhibiting no signs of seizures, will hold off on empiric AED's for now ?- Neurology consulting, discussed with Dr. Rory Percy, appreciate input ?- Intermittent chest x-ray & ABG PRN ?- Ensure adequate pulmonary hygiene  ?- PRN bronchodilators ?- Currently on antibiotics ? ? ?DKA - resolved ? ?INFECTIOUS DISEASE ?-continue antibiotics as prescribed ?-follow up cultures ?Meets SIRS Criteria at admission  ?Pneumonia versus tracheitis, cultures pending ? ? ?ACUTE ANEMIA-UNKNOWN ETIOLOGY ?TRANSFUSE AS NEEDED ?CONSIDER TRANSFUSION  IF HGB<7 ?DVT PRX with TED/SCD's ONLY ? ? ? ?ENDO ?- ICU hypoglycemic\Hyperglycemia protocol ?-check FSBS per protocol ? ? ?GI ?GI PROPHYLAXIS as indicated ? ?NUTRITIONAL STATUS ?DIET-->TF's as tolerated ?Constipation protocol as indicated ? ? ?ELECTROLYTES ?-follow labs as needed ?-replace as needed ?-pharmacy consultation and following ? ? ? ? ? ?Best Practice (right click and "Reselect all SmartList Selections" daily)  ? ?Diet/type: NPO ?DVT prophylaxis: SCD ?GI prophylaxis: PPI ?Lines: N/A ?Foley:  N/A ?Code Status: DNR ? ? ? ?Labs   ?CBC: ?Recent Labs  ?Lab 11/02/21 ?0730 11/03/21 ?0400 11/04/21 ?9233 11/06/21 ?1137 11/06/21 ?1956 11/07/21 ?0076  ?WBC 17.9* 31.5* 30.8* 16.8*  --  15.7*  ?HGB 11.7* 8.2* 7.0* 6.2* 5.8* 9.3*  ?HCT 34.4* 24.2* 21.0* 18.7* 17.7* 27.5*  ?MCV 83.1 83.7 85.0 83.1  --  83.8  ?PLT 209 243 218 276  --  284  ? ? ? ?Basic Metabolic Panel: ?Recent Labs  ?Lab 11/03/21 ?0400 11/04/21 ?8168 11/05/21 ?3870 11/06/21 ?6582 11/07/21 ?6088  ?NA  129* 132* 133* 136 135  ?K 4.1 4.0 3.2* 3.7 3.4*  ?CL 105 103 105 107 104  ?CO2 19* 21* $Remov'22 23 24  'qOHmUJ$ ?GLUCOSE 193* 117* 159* 234* 234*  ?BUN $Rem'18 15 19 22 21  'ICvz$ ?CREATININE 0.94 0.78 0.79 0.76 0.66  ?CALCIUM 7.0* 7.5*

## 2021-11-08 ENCOUNTER — Inpatient Hospital Stay: Payer: Medicare HMO

## 2021-11-08 DIAGNOSIS — G939 Disorder of brain, unspecified: Secondary | ICD-10-CM | POA: Diagnosis not present

## 2021-11-08 LAB — TYPE AND SCREEN
ABO/RH(D): B POS
Antibody Screen: NEGATIVE
Unit division: 0
Unit division: 0

## 2021-11-08 LAB — BASIC METABOLIC PANEL
Anion gap: 7 (ref 5–15)
BUN: 22 mg/dL (ref 8–23)
CO2: 26 mmol/L (ref 22–32)
Calcium: 7.5 mg/dL — ABNORMAL LOW (ref 8.9–10.3)
Chloride: 102 mmol/L (ref 98–111)
Creatinine, Ser: 0.6 mg/dL — ABNORMAL LOW (ref 0.61–1.24)
GFR, Estimated: >60 mL/min (ref >60)
Glucose, Bld: 220 mg/dL — ABNORMAL HIGH (ref 70–99)
Potassium: 3.7 mmol/L (ref 3.5–5.1)
Sodium: 135 mmol/L (ref 135–145)

## 2021-11-08 LAB — PHOSPHORUS: Phosphorus: 1.9 mg/dL — ABNORMAL LOW (ref 2.5–4.6)

## 2021-11-08 LAB — GLUCOSE, CAPILLARY
Glucose-Capillary: 192 mg/dL — ABNORMAL HIGH (ref 70–99)
Glucose-Capillary: 196 mg/dL — ABNORMAL HIGH (ref 70–99)
Glucose-Capillary: 201 mg/dL — ABNORMAL HIGH (ref 70–99)
Glucose-Capillary: 222 mg/dL — ABNORMAL HIGH (ref 70–99)
Glucose-Capillary: 222 mg/dL — ABNORMAL HIGH (ref 70–99)
Glucose-Capillary: 223 mg/dL — ABNORMAL HIGH (ref 70–99)

## 2021-11-08 LAB — BPAM RBC
Blood Product Expiration Date: 202305122359
Blood Product Expiration Date: 202306012359
ISSUE DATE / TIME: 202305032313
ISSUE DATE / TIME: 202305040209
Unit Type and Rh: 5100
Unit Type and Rh: 7300

## 2021-11-08 LAB — PROCALCITONIN: Procalcitonin: 0.11 ng/mL

## 2021-11-08 MED ORDER — INSULIN DETEMIR 100 UNIT/ML ~~LOC~~ SOLN
7.0000 [IU] | Freq: Two times a day (BID) | SUBCUTANEOUS | Status: DC
Start: 2021-11-08 — End: 2021-11-13
  Administered 2021-11-08 – 2021-11-13 (×7): 7 [IU] via SUBCUTANEOUS
  Filled 2021-11-08 (×11): qty 0.07

## 2021-11-08 MED ORDER — IOHEXOL 9 MG/ML PO SOLN: 500.0000 mL | ORAL | Status: DC

## 2021-11-08 MED ORDER — POTASSIUM PHOSPHATES 15 MMOLE/5ML IV SOLN
30.0000 mmol | Freq: Once | INTRAVENOUS | Status: AC
  Administered 2021-11-08: 30 mmol via INTRAVENOUS
  Filled 2021-11-08: qty 10

## 2021-11-08 MED ORDER — IOHEXOL 9 MG/ML PO SOLN
500.0000 mL | ORAL | Status: AC
  Administered 2021-11-08 (×2): 500 mL

## 2021-11-08 MED ORDER — IOHEXOL 300 MG/ML  SOLN
100.0000 mL | Freq: Once | INTRAMUSCULAR | Status: AC | PRN
  Administered 2021-11-08: 100 mL via INTRAVENOUS

## 2021-11-08 NOTE — Progress Notes (Signed)
[facility] ADULT ICU REPLACEMENT PROTOCOL ? ? ?The patient does apply for the [facility] Adult ICU Electrolyte Replacment Protocol based on the criteria listed below:  ? ?1.Exclusion criteria: TCTS patients, ECMO patients, and Dialysis patients ?2. Is GFR >/= 30 ml/min? Yes.    ?Patient's GFR today is >60 ?3. Is SCr </= 2? Yes.   ?Patient's SCr is 0.60 mg/dL ?4. Did SCr increase >/= 0.5 in 24 hours? No. ?5.Pt's weight >40kg  Yes.   ?6. Abnormal electrolyte(s):   Phos 1.9  ?7. Electrolytes replaced per protocol ?8.  Call MD STAT for K+ </= 2.5, Phos </= 1, or Mag </= 1 ?Physician:  S. Sommer ? ?Christopher [name] 11/08/2021 6:13 AM ? ?

## 2021-11-08 NOTE — Progress Notes (Signed)
Inpatient Diabetes Program Recommendations ? ?AACE/ADA: New Consensus Statement on Inpatient Glycemic Control (2015) ? ?Target Ranges:  Prepandial:   less than 140 mg/dL ?     Peak postprandial:   less than 180 mg/dL (1-2 hours) ?     Critically ill patients:  140 - 180 mg/dL  ? ?Lab Results  ?Component Value Date  ? GLUCAP 223 (H) 11/08/2021  ? ? ?Review of Glycemic Control ? Latest Reference Range & Units 11/07/21 07:14 11/07/21 13:06 11/07/21 16:20 11/07/21 19:58 11/08/21 00:33 11/08/21 03:38 11/08/21 07:33 11/08/21 11:06  ?Glucose-Capillary 70 - 99 mg/dL 416 (H) 606 (H) 301 (H) 176 (H) 222 (H) 222 (H) 196 (H) 223 (H)  ? ? ?Current orders for Inpatient glycemic control:  ?Levemir 7 units bid ?Novolog 0-9 units Q4 hours ? ?Vital AF 55 ml/hour ? ?Inpatient Diabetes Program Recommendations:   ? ?Note Levemir reduced from 14 units bid (only getting pm dose) to 7 units bid (agree). Pt on Tube Feeds. ? ?-  If trends continue to be elevated pt may benefit from Novolog 2 units Q4 hours Tube Feed Coverage. ? ?Thanks, ? ?Christena Deem RN, MSN, BC-ADM ?Inpatient Diabetes Coordinator ?Team Pager [phone] (8a-5p) ? ? ? ?

## 2021-11-08 NOTE — Consult Note (Signed)
PHARMACY CONSULT NOTE ? ?Pharmacy Consult for Electrolyte Monitoring and Replacement  ? ?Recent Labs: ?Potassium (mmol/L)  ?Date Value  ?11/08/2021 3.7  ? ?Magnesium (mg/dL)  ?Date Value  ?11/07/2021 1.8  ? ?Calcium (mg/dL)  ?Date Value  ?11/08/2021 7.5 (L)  ? ?Albumin (g/dL)  ?Date Value  ?11/01/2021 3.8  ? ?Phosphorus (mg/dL)  ?Date Value  ?11/08/2021 1.9 (L)  ? ?Sodium (mmol/L)  ?Date Value  ?11/08/2021 135  ? ?Assessment: ?Patient is a 88 y/o M with medical history including no pertinent past medical history on file who is admitted after fall complicated by large subdural hematoma. Patient is status post tracheostomy on 4/29 and craniotomy with hematoma evacuation on 5/1. Patient is admitted to the ICU where he remains intubated, sedated, and on mechanical ventilation. Pharmacy consulted to assist with electrolyte monitoring and replacement as indicated. ? ?Nutrition: Tube feeds started 5/1 ? ?Goal of Therapy:  ?Electrolytes within normal limits ? ?Plan:  ?--Phos 1.9, IV potassium phosphate 30 mmol x 1 per [facility] RN ?--Follow-up electrolytes with AM labs tomorrow ? ?Benita Gutter ?11/08/2021 ?8:00 AM ?

## 2021-11-08 NOTE — Progress Notes (Signed)
? ?NAME:  Christopher [name], MRN:  [id number], DOB:  [birth year], LOS: 7 ?ADMISSION DATE:  11/01/2021, CONSULTATION DATE:  11/01/2021 ?REFERRING MD: Dr. Corky Downs, CHIEF COMPLAINT:  Unresponsive  ? ?Brief Pt Description / Synopsis:  ?88 y.o. Male admitted with Acute Metabolic Encephalopathy in setting of large subdural hematoma with associated mass effect with left midline shift.  Pt deemed not a surgical candidate by Neurosurgery.  Pt also found to have DKA. ? ?History of Present Illness:  ?Christopher [name] is a 88 year old male with unknown past medical history who presents to [facility] ED on 11/01/2021 due to unresponsiveness. Pt is currently intubated and unable to provide history and no family is currently available, therefore history is obtained from chart review. ? ?EMS reported the patient had a fall off the toilet, apparently hit his head, and was unresponsive.  EMS noted the patient to be posturing with them. ? ?Shortly after arrival to the ED he was intubated for airway protection due to GCS score of 3.  Patient remained unresponsive with fixed pupils, along with decorticate posturing noted on the right. ? ?ED Course: ?Initial Vital Signs: Temperature 92 Fahrenheit, pulse 95, respiratory rate 18, BP 158/103, SPO2 100% ?Significant Labs: Sodium 134, bicarb 14, glucose 442, BUN 31, creatinine 1.1, anion gap 19, high-sensitivity troponin 6, lactic acid 8.6, WBC 19.4, PT 14.8, INR 1.2, beta hydroxybutyric acid 1.78 ?Urinalysis negative for UTI ?Urine drug screen negative ?Imaging ?Chest X-ray>>FINDINGS: ?Interval placement of ET tube with tip positioned approximately 2.0 ?cm from the carina. Enteric tube coursing below the diaphragm. ?Cardiac and mediastinal contours are unchanged. Mild bibasilar ?atelectasis. Trace right pleural effusion. ?CT head without contrast>>IMPRESSION: ?1. Increased size of a now large (2 cm thick) acute right cerebral ?convexity subdural hemorrhage. Significant associated mass effect ?with 1.4 cm of  leftward midline shift. Recommend urgent ?neurosurgical consultation. ?2. Rounding of the left temporal horn, compatible with ventricular ?entrapment. ?3. 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 ?Medications Administered: 1 L normal saline bolus, cefepime, Flagyl, vancomycin, insulin drip ? ?ED provider discussed with Dr. Lacinda Axon of Neurosurgery regarding CT head showing large subdural hematoma.  Neurosurgery deemed that patient was not a surgical candidate.  He was given IV fluids along with broad-spectrum antibiotics including IV cefepime, Flagyl, vancomycin as he met SIRS criteria.  He was also placed on IV insulin drip due to meeting DKA criteria. ? ?PCCM is asked to admit the patient to ICU for further work-up and treatment. ? ?Pertinent  Medical History  ?No past medical history on file. ? ? ?Micro Data:  ?4/28: SARS-CoV-2 and influenza PCR>>negative ?4/28: Blood culture x2>> ?5/01: Lurline Idol cult>> ? ?Antimicrobials:  ?Cefepime 4/28>> ?Vancomycin 4/28>> ?Flagyl 4/28 x 1 dose ? ?Significant Hospital Events: ?Including procedures, antibiotic start and stop dates in addition to other pertinent events   ?4/28: Presented to ER unresponsive, fixed pupils, posturing.  Intubated for airway protection.  Found to have large subdural hematoma with mass effect and left shift.  Deemed not a surgical candidate by neurosurgery.  Recommended comfort care, however family wants all aggressive measures.  PCCM asked to admit ?4/29 remains encephalopathic, poor prognosis ?4/30 s/p CRANIOTOMY AND s/p TRACH ?5/01 CNS drain removed by neurosurg ?5/02 Dr. Rory Percy met with family, pt remains encephalopathic ?5/3 remains encephalopathic, severe brain damage ?5/4 left sided plegia, follows commands with family, severe brain damage ?5/5 PS mode at 15 then 12 left sided plegia, follows commands with family, severe brain damage ? ?Interim History / Subjective:  ?  Failure to wean from vent ?S/p  trach ?PS mode of 12 now ?+brain damage ?DUBHOFF IN PLACE ?STARTED TF's ? ?Objective   ?Blood pressure 130/64, pulse 69, temperature 98.4 ?F (36.9 ?C), resp. rate 18, height _0  (1.575 m), weight 61 kg, SpO2 100 %. ?   ?Vent Mode: PSV ?FiO2 (%):  [28 %] 28 % ?Set Rate:  [18 bmp] 18 bmp ?Vt Set:  [500 mL] 500 mL ?PEEP:  [5 cmH20] 5 cmH20 ?Pressure Support:  [12 cmH20] 12 cmH20 ?Plateau Pressure:  [13 cmH20] 13 cmH20  ? ?Intake/Output Summary (Last 24 hours) at 11/08/2021 0746 ?Last data filed at 11/08/2021 0400 ?Gross per 24 hour  ?Intake 1620 ml  ?Output 2075 ml  ?Net -455 ml  ? ? ?Filed Weights  ? 11/05/21 0343 11/07/21 0500 11/08/21 0413  ?Weight: 59.3 kg 62.8 kg 61 kg  ? ? ?REVIEW OF SYSTEMS ? ?PATIENT IS UNABLE TO PROVIDE COMPLETE REVIEW OF SYSTEMS DUE TO SEVERE CRITICAL ILLNESS AND TOXIC METABOLIC ENCEPHALOPATHY ? ? ? ?PHYSICAL EXAMINATION: ? ?GENERAL:critically ill appearing,  ?EYES: Pupils equal, round, reactive to light.  No scleral icterus.  ?MOUTH: Moist mucosal membrane.s/p TRACH ?NECK: Supple.  ?PULMONARY: +rhonchi, +wheezing ?CARDIOVASCULAR: S1 and S2.  No murmurs  ?GASTROINTESTINAL: Soft, nontender, -distended. Positive bowel sounds.  ?MUSCULOSKELETAL: No swelling, clubbing, or edema.  ?NEURO On no sedatives, does have positive cough/gag reflex, withdraws all extremities to pain, pupils fixed and unequal (right 5 mm, left 3 mm) ?Intermittently follows commands RT side movement with commands, Left side hemiplegia ?SKIN:intact,warm,dry ? ?SDH evacuated 4/30 ?No significant NEURO Recovery 4 days post  op ? ?Assessment & Plan:  ? ?22 o [place] male with Acute Encephalopathy in setting of TRAUMATIC Large Subdural Hematoma with associated mass effect and left midline shift ?Intubated for airway protection +severe brain damage ? ?Severe ACUTE Hypoxic and Hypercapnic Respiratory Failure ?S/p TRACH ?-continue Mechanical Ventilator support ?-Wean Fio2 and PEEP as tolerated ?-VAP/VENT bundle implementation ?- Wean  PEEP & FiO2 as tolerated, maintain SpO2 > 88% ?- Head of bed elevated 30 degrees, VAP protocol in place ?- Plateau pressures less than 30 cm H20  ?- Intermittent chest x-ray & ABG PRN ?- Ensure adequate pulmonary hygiene  ?-will perform SBT when respiratory parameters are met ? ? ? ?NEUROLOGY ?ACUTE TOXIC METABOLIC ENCEPHALOPATHY ?TRAUMATIC BNRAIN DAMAGE SUBDURAL BLEED S/P CRANIOTOMY ?-Frequent Neuro checks ?+anticoagulants as needed ?-Implement measures to reduce ICP: HOB >30 degrees, prevent Hypoxia and Hypotension ?- Neurology consulting, discussed with Dr. Rory Percy, appreciate input ?ABX stopped ? ?DKA - resolved ? ? ?INFECTIOUS DISEASE ?-follow up cultures ? ?ACUTE ANEMIA- ?TRANSFUSE AS NEEDED ?CONSIDER TRANSFUSION  IF HGB<7 ?DVT PRX with TED/SCD's ONLY ? ? ?ENDO ?- ICU hypoglycemic\Hyperglycemia protocol ?-check FSBS per protocol ? ? ?GI ?GI PROPHYLAXIS as indicated ? ?NUTRITIONAL STATUS ?DIET-->TF's as tolerated ?Constipation protocol as indicated ? ? ?ELECTROLYTES ?-follow labs as needed ?-replace as needed ?-pharmacy consultation and following ? ? ? ? ? ?Best Practice (right click and "Reselect all SmartList Selections" daily)  ? ?Diet/type: NPO ?DVT prophylaxis: SCD ?GI prophylaxis: PPI ?Lines: N/A ?Foley:  N/A ?Code Status: DNR ? ? ? ?Labs   ?CBC: ?Recent Labs  ?Lab 11/02/21 ?0730 11/03/21 ?0400 11/04/21 ?2683 11/06/21 ?1137 11/06/21 ?1956 11/07/21 ?4196  ?WBC 17.9* 31.5* 30.8* 16.8*  --  15.7*  ?HGB 11.7* 8.2* 7.0* 6.2* 5.8* 9.3*  ?HCT 34.4* 24.2* 21.0* 18.7* 17.7* 27.5*  ?MCV 83.1 83.7 85.0 83.1  --  83.8  ?PLT 209 243 218 276  --  284  ? ? ? ?Basic Metabolic Panel: ?Recent Labs  ?Lab 11/03/21 ?0400 11/04/21 ?2393 11/05/21 ?5940 11/06/21 ?9050 11/07/21 ?2561 11/08/21 ?5488  ?NA 129* 132* 133* 136 135 135  ?K 4.1 4.0 3.2* 3.7 3.4* 3.7  ?CL 105 103 105 107 104 102  ?CO2 19* 21* _0 ?GLUCOSE 193* 117* 159* 234* 234* 220*  ?BUN _1 ?CREATININE 0.94 0.78 0.79 0.76 0.66 0.60*  ?CALCIUM  7.0* 7.5* 7.6* 7.7* 7.4* 7.5*  ?MG 1.7 1.9 1.9 2.0 1.8  --   ?PHOS 2.8 2.3* 1.6* 1.7* 2.3* 1.9*  ? ? ?GFR: ?Estimated Creatinine Clearance: 44.6 mL/min (A) (by C-G formula based on SCr of 0.6 mg/dL (L)). ?R

## 2021-11-08 NOTE — Progress Notes (Signed)
Trach care performed. Placed patient back on vent. Sputum sample collected and sent to lab ?

## 2021-11-08 NOTE — TOC Progression Note (Signed)
Transition of Care (TOC) - Progression Note  ? ? ?Patient Details  ?Name: Christopher [name] ?MRN: [id number] ?Date of Birth: 08-May-1926 ? ?Transition of Care (TOC) CM/SW Contact  ?Shelbie Hutching, RN ?Phone Number: ?11/08/2021, 4:18 PM ? ?Clinical Narrative:    ?Patient's family is interested in [place].  RNCM met with patient's son's in the waiting room to discuss LTAC options.  [facility] and [facility] both in [place].  Family encouraged to visit both facilities.  They are currently leaning toward [facility].  Referral given to [facility] with [facility].  RNCM will also reach out to [place] with [facility] so she can review to see if they may be able to offer a bed if [facility] cannot.   ? ?RNCM will follow up with Samuella Bruin, son, on Monday.   ? ? ?Expected Discharge Plan: [place] (LTAC) ?Barriers to Discharge: Continued Medical Work up ? ?Expected Discharge Plan and Services ?Expected Discharge Plan: [place] (LTAC) ?  ?Discharge Planning Services: CM Consult ?Post Acute Care Choice: Long Term Acute Care (LTAC) ?Living arrangements for the past 2 months: [place] ?                ?DME Arranged: N/A ?DME Agency: NA ?  ?  ?  ?HH Arranged: NA ?[place] Agency: NA ?  ?  ?  ? ? ?Social Determinants of Health (SDOH) Interventions ?  ? ?Readmission Risk Interventions ?   ? View : No data to display.  ?  ?  ?  ? ? ?

## 2021-11-08 NOTE — Progress Notes (Signed)
RT note:  Placed on transport vent at PSV 12/5 35% for transport to CT.  Tolerated well with no complications.  Upon return to room, patient suctioned and returned to 28% ATC. ?

## 2021-11-08 NOTE — Progress Notes (Signed)
Nutrition Follow Up Note  ? ?DOCUMENTATION CODES:  ? ?Not applicable ? ?INTERVENTION:  ? ?Continue Vital 1.2@55ml /hr continuous  ? ?Free water flushes 2ml q4 hours to maintain tube patency  ? ?Regimen provides 1584kcal/day, 99g/day protein and 1240ml/day of free water.  ? ?Pt at high refeed risk; recommend monitor potassium, magnesium and phosphorus labs daily until stable ? ?NUTRITION DIAGNOSIS:  ? ?Inadequate oral intake related to acute illness as evidenced by NPO status. ? ?GOAL:  ? ?Provide needs based on ASPEN/SCCM guidelines ?-met ? ?MONITOR:  ? ?Vent status, Labs, Weight trends, Skin, TF tolerance, I & O's ? ?ASSESSMENT:  ? ?88 year old man with past history of type 2 diabetes, dementia, hypertension, hyperlipidemia, subdural hematoma secondary to falls with a recent discharge from the hospital on 10/25/2021 where he had right subdural acute/early subacute hematoma and sepsis related to UTI, was managed conservatively and discharged home on 10/25/2021. Pt now returns back on 11/01/2021 with unresponsiveness and was found to have large SDH with L midline shift now s/p craniotomy 4/30 and DKA. ? ?Pt s/p craniotomy 4/30 ?Pt s/p tracheostomy 4/29 ? ?Pt on trach collar today and tolerating well. Pt more awake and is following some commands. NGT in place and pt is tolerating feeds well at goal rate. MD to discuss PEG tube placement with family. Pt continues to refeed; electrolytes being replaced by pharmacy. Per chart, pt is up ~8lbs since admission; pt +2.9L on her I & Os.  ? ?Medications reviewed and include: insulin, protonix, Kphos  ? ?Labs reviewed: K 3.7 wnl, creat 0.60(L), P 1.9(L) ?Mg 1.8 wnl- 5/4 ?Wbc- 15.7(H), Hgb 9.3(L), Hct 27.5(L) ?Cbgs- 223, 196, 222, 222 x 24 hrs ? ?MAP- >69mmHg  ? ?UOP- 1867ml  ? ?Diet Order:   ?Diet Order   ? ?       ?  Diet NPO time specified  Diet effective now       ?  ? ?  ?  ? ?  ? ?EDUCATION NEEDS:  ? ?No education needs have been identified at this time ? ?Skin:  Skin  Assessment: Reviewed RN Assessment (incision head and neck) ? ?Last BM:  5/5- TYPE 7 ? ?Height:  ? ?Ht Readings from Last 1 Encounters:  ?11/01/21 5\' 2"  (1.575 m)  ? ? ?Weight:  ? ?Wt Readings from Last 1 Encounters:  ?11/08/21 61 kg  ? ? ?Ideal Body Weight:  53.6 kg ? ?BMI:  Body mass index is 24.6 kg/m?. ? ?Estimated Nutritional Needs:  ? ?Kcal:  1600-1800kcal/day ? ?Protein:  90-105g/day ? ?Fluid:  1.4-1.6L/day ? ?Koleen Distance MS, RD, LDN ?Please refer to AMION for RD and/or RD on-call/weekend/after hours pager ? ?

## 2021-11-09 DIAGNOSIS — G939 Disorder of brain, unspecified: Secondary | ICD-10-CM | POA: Diagnosis not present

## 2021-11-09 LAB — BASIC METABOLIC PANEL
Anion gap: 9 (ref 5–15)
BUN: 20 mg/dL (ref 8–23)
CO2: 25 mmol/L (ref 22–32)
Calcium: 7.9 mg/dL — ABNORMAL LOW (ref 8.9–10.3)
Chloride: 97 mmol/L — ABNORMAL LOW (ref 98–111)
Creatinine, Ser: 0.59 mg/dL — ABNORMAL LOW (ref 0.61–1.24)
GFR, Estimated: >60 mL/min (ref >60)
Glucose, Bld: 177 mg/dL — ABNORMAL HIGH (ref 70–99)
Potassium: 4 mmol/L (ref 3.5–5.1)
Sodium: 131 mmol/L — ABNORMAL LOW (ref 135–145)

## 2021-11-09 LAB — PHOSPHORUS: Phosphorus: 2.7 mg/dL (ref 2.5–4.6)

## 2021-11-09 LAB — GLUCOSE, CAPILLARY
Glucose-Capillary: 163 mg/dL — ABNORMAL HIGH (ref 70–99)
Glucose-Capillary: 178 mg/dL — ABNORMAL HIGH (ref 70–99)
Glucose-Capillary: 195 mg/dL — ABNORMAL HIGH (ref 70–99)
Glucose-Capillary: 199 mg/dL — ABNORMAL HIGH (ref 70–99)
Glucose-Capillary: 204 mg/dL — ABNORMAL HIGH (ref 70–99)
Glucose-Capillary: 215 mg/dL — ABNORMAL HIGH (ref 70–99)
Glucose-Capillary: 215 mg/dL — ABNORMAL HIGH (ref 70–99)

## 2021-11-09 MED ORDER — INSULIN ASPART 100 UNIT/ML IJ SOLN
2.0000 [IU] | INTRAMUSCULAR | Status: DC
  Administered 2021-11-09 – 2021-11-13 (×14): 2 [IU] via SUBCUTANEOUS
  Filled 2021-11-09 (×15): qty 1

## 2021-11-09 NOTE — Progress Notes (Signed)
? ?NAME:  Christopher [name], MRN:  [id number], DOB:  [birth year], LOS: 8 ?ADMISSION DATE:  11/01/2021, CONSULTATION DATE:  11/01/2021 ?REFERRING MD: Dr. Corky [name], CHIEF COMPLAINT:  Unresponsive  ? ?Brief Pt Description / Synopsis:  ?88 y.o. Male admitted with Acute Metabolic Encephalopathy in setting of large subdural hematoma with associated mass effect with left midline shift.  Pt deemed not a surgical candidate by Neurosurgery.  Pt also found to have DKA. ? ?History of Present Illness:  ?Christopher [name] is a 88 year old male with unknown past medical history who presents to [facility] ED on 11/01/2021 due to unresponsiveness. Pt is currently intubated and unable to provide history and no family is currently available, therefore history is obtained from chart review. ? ?EMS reported the patient had a fall off the toilet, apparently hit his head, and was unresponsive.  EMS noted the patient to be posturing with them. ? ?Shortly after arrival to the ED he was intubated for airway protection due to GCS score of 3.  Patient remained unresponsive with fixed pupils, along with decorticate posturing noted on the right. ? ?ED Course: ?Initial Vital Signs: Temperature 92 Fahrenheit, pulse 95, respiratory rate 18, BP 158/103, SPO2 100% ?Significant Labs: Sodium 134, bicarb 14, glucose 442, BUN 31, creatinine 1.1, anion gap 19, high-sensitivity troponin 6, lactic acid 8.6, WBC 19.4, PT 14.8, INR 1.2, beta hydroxybutyric acid 1.78 ?Urinalysis negative for UTI ?Urine drug screen negative ?Imaging ?Chest X-ray>>FINDINGS: ?Interval placement of ET tube with tip positioned approximately 2.0 ?cm from the carina. Enteric tube coursing below the diaphragm. ?Cardiac and mediastinal contours are unchanged. Mild bibasilar ?atelectasis. Trace right pleural effusion. ?CT head without contrast>>IMPRESSION: ?1. Increased size of a now large (2 cm thick) acute right cerebral ?convexity subdural hemorrhage. Significant associated mass effect ?with 1.4 cm of  leftward midline shift. Recommend urgent ?neurosurgical consultation. ?2. Rounding of the left temporal horn, compatible with ventricular ?entrapment. ?3. 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 ?Medications Administered: 1 L normal saline bolus, cefepime, Flagyl, vancomycin, insulin drip ? ?ED provider discussed with Dr. Lacinda [name] of Neurosurgery regarding CT head showing large subdural hematoma.  Neurosurgery deemed that patient was not a surgical candidate.  He was given IV fluids along with broad-spectrum antibiotics including IV cefepime, Flagyl, vancomycin as he met SIRS criteria.  He was also placed on IV insulin drip due to meeting DKA criteria. ? ?PCCM is asked to admit the patient to ICU for further work-up and treatment. ? ?Pertinent  Medical History  ?No past medical history on file. ? ? ?Micro Data:  ?4/28: SARS-CoV-2 and influenza PCR>>negative ?4/28: Blood culture x2>> ?5/01: Lurline Idol cult>> ? ?Antimicrobials:  ?Cefepime 4/28>> ?Vancomycin 4/28>> ?Flagyl 4/28 x 1 dose ? ?Significant Hospital Events: ?Including procedures, antibiotic start and stop dates in addition to other pertinent events   ?4/28: Presented to ER unresponsive, fixed pupils, posturing.  Intubated for airway protection.  Found to have large subdural hematoma with mass effect and left shift.  Deemed not a surgical candidate by neurosurgery.  Recommended comfort care, however family wants all aggressive measures.  PCCM asked to admit ?4/29 remains encephalopathic, poor prognosis ?4/30 s/p CRANIOTOMY AND s/p TRACH ?5/01 CNS drain removed by neurosurg ?5/02 Dr. Rory [name] met with family, pt remains encephalopathic ?5/3 remains encephalopathic, severe brain damage ?5/4 left sided plegia, follows commands with family, severe brain damage ?5/5 PS mode at 15 then 12 left sided plegia, follows commands with family, severe brain damage ?5/6 failed weaning trials increased  secretions ? ?Interim  History / Subjective:  ?S/p traumatic subdural bleed ?Failure to wean from vent ?Increased secretions ?S/p trach ?Needs PEG tube ?+brain damage LEFT sided hemiplegia ?Started TF's ? ?Vent Mode: PSV ?FiO2 (%):  [28 %] 28 % ?PEEP:  [5 cmH20] 5 cmH20 ?Pressure Support:  [10 cmH20-12 cmH20] 10 cmH20 ? ? ? ? ?Objective   ?Blood pressure (!) 112/43, pulse 88, temperature 99.5 ?F (37.5 ?C), resp. rate (!) 26, height _0  (1.575 m), weight 62.4 kg, SpO2 99 %. ?   ?Vent Mode: PSV ?FiO2 (%):  [28 %] 28 % ?PEEP:  [5 cmH20] 5 cmH20 ?Pressure Support:  [10 cmH20-12 cmH20] 10 cmH20  ? ?Intake/Output Summary (Last 24 hours) at 11/09/2021 0714 ?Last data filed at 11/09/2021 0000 ?Gross per 24 hour  ?Intake 1100 ml  ?Output 1850 ml  ?Net -750 ml  ? ? ?Filed Weights  ? 11/07/21 0500 11/08/21 0413 11/09/21 0500  ?Weight: 62.8 kg 61 kg 62.4 kg  ? ? ?REVIEW OF SYSTEMS ? ?PATIENT IS UNABLE TO PROVIDE COMPLETE REVIEW OF SYSTEMS DUE TO SEVERE CRITICAL ILLNESS AND TOXIC METABOLIC ENCEPHALOPATHY ? ? ?PHYSICAL EXAMINATION: ? ?GENERAL:critically ill appearing, +resp distress ?EYES: Pupils equal, round, reactive to light.  No scleral icterus.  ?MOUTH: Moist mucosal membrane. INTUBATED ?NECK: Supple.  ?PULMONARY: +rhonchi, +wheezing ?CARDIOVASCULAR: S1 and S2.  No murmurs  ?GASTROINTESTINAL: Soft, nontender, -distended. Positive bowel sounds.  ?MUSCULOSKELETAL: No swelling, clubbing, or edema.  ?NEUROLOGIC:On no sedatives, does have positive cough/gag reflex, withdraws all extremities to pain, pupils fixed and unequal (right 5 mm, left 3 mm) ?Intermittently follows commands RT side movement with commands, Left side hemiplegia ?SKIN:intact,warm,dry ? ? ?SDH evacuated 4/30 ?No significant NEURO Recovery 4 days post  op ? ?Assessment & Plan:  ? ?43 o [place] male with Acute Encephalopathy in setting of TRAUMATIC Large Subdural Hematoma with associated mass effect and left midline shift ?Intubated for airway protection +severe brain damage ? ?Severe  ACUTE Hypoxic and Hypercapnic Respiratory Failure ?-continue Mechanical Ventilator support ?-Wean Fio2 and PEEP as tolerated ?-VAP/VENT bundle implementation ?- Wean PEEP & FiO2 as tolerated, maintain SpO2 > 88% ?- Head of bed elevated 30 degrees, VAP protocol in place ?- Plateau pressures less than 30 cm H20  ?- Intermittent chest x-ray & ABG PRN ?- Ensure adequate pulmonary hygiene  ?-will perform TCT as tolerated ?+secretions check cultures ? ? ? ?NEUROLOGY ?ACUTE TOXIC METABOLIC ENCEPHALOPATHY ?TRAUMATIC BNRAIN DAMAGE SUBDURAL BLEED S/P CRANIOTOMY ?-neuro checks ?DVT prx on hold(has pork derivatives) against patients religion ?Other PRX agents don't have reversible antidotes ?-Implement measures to reduce ICP: HOB >30 degrees, prevent Hypoxia and Hypotension ?- Neurology consulting, discussed with Dr. Rory [name], appreciate input ?ABX stopped ? ? ?INFECTIOUS DISEASE ?-follow up cultures ? ?ACUTE ANEMIA- ?TRANSFUSE AS NEEDED ?CONSIDER TRANSFUSION  IF HGB<7 ?DVT PRX with TED/SCD's ONLY ? ? ? ? ?ENDO DKA resolved ?- ICU hypoglycemic\Hyperglycemia protocol ?-check FSBS per protocol ? ? ? ?GI ?GI PROPHYLAXIS as indicated ? ?NUTRITIONAL STATUS ?DIET-->TF's as tolerated ?Constipation protocol as indicated ?IR consulted for PEG 5/5 ? ? ?ELECTROLYTES ?-follow labs as needed ?-replace as needed ?-pharmacy consultation and following ? ? ?Best Practice (right click and "Reselect all SmartList Selections" daily)  ? ?Diet/type: NPO ?DVT prophylaxis: SCD ?GI prophylaxis: PPI ?Lines: N/A ?Foley:  N/A ?Code Status: DNR ? ? ? ?Labs   ?CBC: ?Recent Labs  ?Lab 11/02/21 ?0730 11/03/21 ?0400 11/04/21 ?1017 11/06/21 ?1137 11/06/21 ?1956 11/07/21 ?5102  ?WBC 17.9* 31.5* 30.8* 16.8*  --  15.7*  ?HGB 11.7* 8.2* 7.0* 6.2* 5.8* 9.3*  ?HCT 34.4* 24.2* 21.0* 18.7* 17.7* 27.5*  ?MCV 83.1 83.7 85.0 83.1  --  83.8  ?PLT 209 243 218 276  --  284  ? ? ? ?Basic Metabolic Panel: ?Recent Labs  ?Lab 11/03/21 ?0400 11/04/21 ?2763 11/05/21 ?9432  11/06/21 ?0037 11/07/21 ?9444 11/08/21 ?6190 11/09/21 ?0451  ?NA 129* 132* 133* 136 135 135 131*  ?K 4.1 4.0 3.2* 3.7 3.4* 3.7 4.0  ?CL 105 103 105 107 104 102 97*  ?CO2 19* 21* _0 ?GLUCOSE 193* 117* 159* 234* 2

## 2021-11-09 NOTE — Consult Note (Signed)
PHARMACY CONSULT NOTE ? ?Pharmacy Consult for Electrolyte Monitoring and Replacement  ? ?Recent Labs: ?Potassium (mmol/L)  ?Date Value  ?11/09/2021 4.0  ? ?Magnesium (mg/dL)  ?Date Value  ?11/07/2021 1.8  ? ?Calcium (mg/dL)  ?Date Value  ?11/09/2021 7.9 (L)  ? ?Albumin (g/dL)  ?Date Value  ?11/01/2021 3.8  ? ?Phosphorus (mg/dL)  ?Date Value  ?11/09/2021 2.7  ? ?Sodium (mmol/L)  ?Date Value  ?11/09/2021 131 (L)  ? ?Assessment: ?Patient is a 88 y/o M with medical history including no pertinent past medical history on file who is admitted after fall complicated by large subdural hematoma. Patient is status post tracheostomy on 4/29 and craniotomy with hematoma evacuation on 5/1. Patient is admitted to the ICU on trach collar. Pharmacy consulted to assist with electrolyte monitoring and replacement as indicated. ? ?Nutrition:  Vital 1.2@55ml /hr continuous  ?--Free water flushes 31ml q4 hours to maintain tube patency  ? ?Goal of Therapy:  ?Electrolytes within normal limits ? ?Plan:  ?--no electrolyte replacement warranted today ?--Follow-up electrolytes with AM labs tomorrow ? ?Dallie Piles ?11/09/2021 ?7:59 AM ?

## 2021-11-09 NOTE — IPAL (Signed)
?  Interdisciplinary Goals of Care Family Meeting ? ? ?Date carried out: 11/09/2021 ? ?Location of the meeting: Bedside ? ? ? ? ? ?GOALS OF CARE DISCUSSION ? ?The Clinical status was relayed to family in detail-Son at bedside ? ?Updated and notified of patients medical condition- ?Patient open eyes to command.   ?Patient is having a weak cough and struggling to remove secretions.   ? ?Explained to family course of therapy and the modalities  ? ?Patient with Progressive multiorgan failure with a very high probablity of a very minimal chance of meaningful recovery despite all aggressive and optimal medical therapy.  ?PATIENT REMAINS DNR ? ?Family understands the situation. ? ?Plan for PEG tube ?LTACH ASSESSMENT ?Remains on vent, follow up cultures ? ?Family are satisfied with Plan of action and management. All questions answered ? ?Additional CC time 25 mins ? ? ?Lucie Leather, M.D.  ?[facility]  ?Medical Director [facility] ?Medical Director [facility] Cardio-Pulmonary Department  ? ? ?

## 2021-11-09 NOTE — Progress Notes (Signed)
CT abd shows patient anatomy amendable for G-tube. Procedure approved by Dr. Pascal Lux.   ?Orders placed for NPO/stop tube feeds at [facility] 5/8. ?Schedule permitting, procedure planned for 5/8. ? ? ?Narda Rutherford, AGNP-BC ?11/09/2021, 1:24 PM ? ? ?

## 2021-11-10 ENCOUNTER — Encounter: Payer: Self-pay | Admitting: Internal Medicine

## 2021-11-10 DIAGNOSIS — G939 Disorder of brain, unspecified: Secondary | ICD-10-CM | POA: Diagnosis not present

## 2021-11-10 LAB — GLUCOSE, CAPILLARY
Glucose-Capillary: 199 mg/dL — ABNORMAL HIGH (ref 70–99)
Glucose-Capillary: 131 mg/dL — ABNORMAL HIGH (ref 70–99)
Glucose-Capillary: 150 mg/dL — ABNORMAL HIGH (ref 70–99)
Glucose-Capillary: 154 mg/dL — ABNORMAL HIGH (ref 70–99)
Glucose-Capillary: 185 mg/dL — ABNORMAL HIGH (ref 70–99)
Glucose-Capillary: 172 mg/dL — ABNORMAL HIGH (ref 70–99)

## 2021-11-10 LAB — CBC
HCT: 30 % — ABNORMAL LOW (ref 39.0–52.0)
Hemoglobin: 9.8 g/dL — ABNORMAL LOW (ref 13.0–17.0)
MCH: 28.2 pg (ref 26.0–34.0)
MCHC: 32.7 g/dL (ref 30.0–36.0)
MCV: 86.5 fL (ref 80.0–100.0)
Platelets: 238 10*3/uL (ref 150–400)
RBC: 3.47 MIL/uL — ABNORMAL LOW (ref 4.22–5.81)
RDW: 14.8 % (ref 11.5–15.5)
WBC: 10.7 10*3/uL — ABNORMAL HIGH (ref 4.0–10.5)
nRBC: 0.2 % (ref 0.0–0.2)

## 2021-11-10 LAB — BASIC METABOLIC PANEL
Anion gap: 10 (ref 5–15)
BUN: 26 mg/dL — ABNORMAL HIGH (ref 8–23)
CO2: 22 mmol/L (ref 22–32)
Calcium: 8 mg/dL — ABNORMAL LOW (ref 8.9–10.3)
Chloride: 100 mmol/L (ref 98–111)
Creatinine, Ser: 0.62 mg/dL (ref 0.61–1.24)
GFR, Estimated: >60 mL/min (ref >60)
Glucose, Bld: 172 mg/dL — ABNORMAL HIGH (ref 70–99)
Potassium: 4 mmol/L (ref 3.5–5.1)
Sodium: 132 mmol/L — ABNORMAL LOW (ref 135–145)

## 2021-11-10 LAB — PHOSPHORUS: Phosphorus: 2.4 mg/dL — ABNORMAL LOW (ref 2.5–4.6)

## 2021-11-10 LAB — MAGNESIUM: Magnesium: 2 mg/dL (ref 1.7–2.4)

## 2021-11-10 MED ORDER — VITAL AF 1.2 CAL PO LIQD: 1000.0000 mL | ORAL | Status: AC

## 2021-11-10 MED ORDER — POTASSIUM & SODIUM PHOSPHATES 280-160-250 MG PO PACK
2.0000 | PACK | Freq: Once | ORAL | Status: AC
  Administered 2021-11-10: 2
  Filled 2021-11-10: qty 2

## 2021-11-10 MED ORDER — POTASSIUM & SODIUM PHOSPHATES 280-160-250 MG PO PACK: 2.0000 | PACK | Freq: Once | ORAL | Status: DC

## 2021-11-10 NOTE — Plan of Care (Signed)
TF turned off @0000  for PEG procedure today. No acute events overnight.  ?Problem: Education: ?Goal: Knowledge of General Education information will improve ?Description: Including pain rating scale, medication(s)/side effects and non-pharmacologic comfort measures ?Outcome: Progressing ?  ?Problem: Clinical Measurements: ?Goal: Ability to maintain clinical measurements within normal limits will improve ?Outcome: Progressing ?Goal: Will remain free from infection ?Outcome: Progressing ?Goal: Diagnostic test results will improve ?Outcome: Progressing ?Goal: Respiratory complications will improve ?Outcome: Progressing ?Goal: Cardiovascular complication will be avoided ?Outcome: Progressing ?  ?Problem: Nutrition: ?Goal: Adequate nutrition will be maintained ?Outcome: Progressing ?  ?Problem: Coping: ?Goal: Level of anxiety will decrease ?Outcome: Progressing ?  ?Problem: Elimination: ?Goal: Will not experience complications related to bowel motility ?Outcome: Progressing ?Goal: Will not experience complications related to urinary retention ?Outcome: Progressing ?  ?Problem: Pain Managment: ?Goal: General experience of comfort will improve ?Outcome: Progressing ?  ?Problem: Skin Integrity: ?Goal: Risk for impaired skin integrity will decrease ?Outcome: Progressing ?  ?

## 2021-11-10 NOTE — Progress Notes (Signed)
? ?NAME:  Christopher [name], MRN:  [id number], DOB:  07-02-1926, LOS: 9 ?ADMISSION DATE:  11/01/2021, CONSULTATION DATE:  11/01/2021 ?REFERRING MD: Dr. Corky Downs, CHIEF COMPLAINT:  Unresponsive  ? ?Brief Pt Description / Synopsis:  ?88 y.o. Male admitted with Acute Metabolic Encephalopathy in setting of large subdural hematoma with associated mass effect with left midline shift.  Pt deemed not a surgical candidate by Neurosurgery.  Pt also found to have DKA. ? ?History of Present Illness:  ?Christopher [name] is a 88 year old male with unknown past medical history who presents to [facility] ED on 11/01/2021 due to unresponsiveness. Pt is currently intubated and unable to provide history and no family is currently available, therefore history is obtained from chart review. ? ?EMS reported the patient had a fall off the toilet, apparently hit his head, and was unresponsive.  EMS noted the patient to be posturing with them. ? ?Shortly after arrival to the ED he was intubated for airway protection due to GCS score of 3.  Patient remained unresponsive with fixed pupils, along with decorticate posturing noted on the right. ? ?ED Course: ?Initial Vital Signs: Temperature 92 Fahrenheit, pulse 95, respiratory rate 18, BP 158/103, SPO2 100% ?Significant Labs: Sodium 134, bicarb 14, glucose 442, BUN 31, creatinine 1.1, anion gap 19, high-sensitivity troponin 6, lactic acid 8.6, WBC 19.4, PT 14.8, INR 1.2, beta hydroxybutyric acid 1.78 ?Urinalysis negative for UTI ?Urine drug screen negative ?Imaging ?Chest X-ray>>FINDINGS: ?Interval placement of ET tube with tip positioned approximately 2.0 ?cm from the carina. Enteric tube coursing below the diaphragm. ?Cardiac and mediastinal contours are unchanged. Mild bibasilar ?atelectasis. Trace right pleural effusion. ?CT head without contrast>>IMPRESSION: ?1. Increased size of a now large (2 cm thick) acute right cerebral ?convexity subdural hemorrhage. Significant associated mass effect ?with 1.4 cm of  leftward midline shift. Recommend urgent ?neurosurgical consultation. ?2. Rounding of the left temporal horn, compatible with ventricular ?entrapment. ?3. 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 ?Medications Administered: 1 L normal saline bolus, cefepime, Flagyl, vancomycin, insulin drip ? ?ED provider discussed with Dr. Lacinda Axon of Neurosurgery regarding CT head showing large subdural hematoma.  Neurosurgery deemed that patient was not a surgical candidate.  He was given IV fluids along with broad-spectrum antibiotics including IV cefepime, Flagyl, vancomycin as he met SIRS criteria.  He was also placed on IV insulin drip due to meeting DKA criteria. ? ?PCCM is asked to admit the patient to ICU for further work-up and treatment. ? ?Pertinent  Medical History  ?No past medical history on file. ? ? ?Micro Data:  ?4/28: SARS-CoV-2 and influenza PCR>>negative ?4/28: Blood culture x2>> ?5/01: Lurline Idol cult>> ? ?Antimicrobials:  ?Cefepime 4/28>> ?Vancomycin 4/28>> ?Flagyl 4/28 x 1 dose ? ?Significant Hospital Events: ?Including procedures, antibiotic start and stop dates in addition to other pertinent events   ?4/28: Presented to ER unresponsive, fixed pupils, posturing.  Intubated for airway protection.  Found to have large subdural hematoma with mass effect and left shift.  Deemed not a surgical candidate by neurosurgery.  Recommended comfort care, however family wants all aggressive measures.  PCCM asked to admit ?4/29 remains encephalopathic, poor prognosis ?4/30 s/p CRANIOTOMY AND s/p TRACH ?5/01 CNS drain removed by neurosurg ?5/02 Dr. Rory Percy met with family, pt remains encephalopathic ?5/3 remains encephalopathic, severe brain damage ?5/4 left sided plegia, follows commands with family, severe brain damage ?5/5 PS mode at 15 then 12 left sided plegia, follows commands with family, severe brain damage ?5/6 failed weaning trials increased  secretions ?5/7 secretions,  LEFT Sided weakness, failure to wean from vent ? ?Interim History / Subjective:  ?S/p traumtiac bran damage and subdural bleed ?Left sided hemiplegia ?S/p trach failure to wean from vent ?+Increased secretions ?Needs PEG tbe ?+BRAIN DAMAGE ? ?Vent Mode: PSV ?FiO2 (%):  [28 %] 28 % ?PEEP:  [5 cmH20] 5 cmH20 ?Pressure Support:  [10 cmH20] 10 cmH20 ? ? ? ? ?Objective   ?Blood pressure (!) 88/57, pulse 88, temperature 99.1 ?F (37.3 ?C), resp. rate (!) 22, height _0  (1.575 m), weight 62.4 kg, SpO2 100 %. ?   ?Vent Mode: PSV ?FiO2 (%):  [28 %] 28 % ?PEEP:  [5 cmH20] 5 cmH20 ?Pressure Support:  [10 cmH20] 10 cmH20  ? ?Intake/Output Summary (Last 24 hours) at 11/10/2021 0715 ?Last data filed at 11/10/2021 0000 ?Gross per 24 hour  ?Intake 1125 ml  ?Output 1775 ml  ?Net -650 ml  ? ? ?Filed Weights  ? 11/07/21 0500 11/08/21 0413 11/09/21 0500  ?Weight: 62.8 kg 61 kg 62.4 kg  ? ?REVIEW OF SYSTEMS ? ?PATIENT IS UNABLE TO PROVIDE COMPLETE REVIEW OF SYSTEMS DUE TO SEVERE CRITICAL ILLNESS AND TOXIC METABOLIC ENCEPHALOPATHY ? ? ? ?PHYSICAL EXAMINATION: ? ?GENERAL:critically ill appearing, +resp distress ?EYES: Pupils equal, round, reactive to light.  No scleral icterus.  ?MOUTH: Moist mucosal membrane. INTUBATED ?NECK: Supple.  ?PULMONARY: +rhonchi, +wheezing ?CARDIOVASCULAR: S1 and S2.  No murmurs  ?GASTROINTESTINAL: Soft, nontender, -distended. Positive bowel sounds.  ?MUSCULOSKELETAL: No swelling, clubbing, or edema.  ?NEUROLOGIC: no sedatives, does have positive cough/gag reflex, withdraws all extremities to pain, pupils fixed and unequal (right 5 mm, left 3 mm) ?Intermittently follows commands RT side movement with commands, Left side hemiplegia ?SKIN:intact,warm,dry ? ? ? ?SDH evacuated 4/30 ?No significant NEURO Recovery 4 days post  op ? ?Assessment & Plan:  ? ?24 o [place] male with Acute Encephalopathy in setting of TRAUMATIC Large Subdural Hematoma with associated mass effect and left midline shift ?Intubated for airway  protection +severe brain damage ? ?Severe ACUTE Hypoxic and Hypercapnic Respiratory Failure ?-continue Mechanical Ventilator support ?-Wean Fio2 and PEEP as tolerated ?-VAP/VENT bundle implementation ?- Wean PEEP & FiO2 as tolerated, maintain SpO2 > 88% ?- Head of bed elevated 30 degrees, VAP protocol in place ?- Plateau pressures less than 30 cm H20  ?- Intermittent chest x-ray & ABG PRN ?- Ensure adequate pulmonary hygiene  ?-will perform SBT when respiratory parameters are met ?Follow up cultures to assess for pneumonia ? ? ? ?NEUROLOGY ?ACUTE TOXIC METABOLIC ENCEPHALOPATHY ?TRAUMATIC BNRAIN DAMAGE SUBDURAL BLEED S/P CRANIOTOMY ?Neuro checks per protocol ?Avoid secondary brain injury ?-Implement measures to reduce ICP: HOB >30 degrees, prevent Hypoxia and Hypotension ? ?INFECTIOUS DISEASE ?-follow up cultures ? ?ACUTE ANEMIA- ?TRANSFUSE AS NEEDED ?CONSIDER TRANSFUSION  IF HGB<7 ?DVT PRX with TED/SCD's ONLY ?HEP AND LOVENOX ARE PORK DERIVATIVES-WILL NOT GIVE TO PATIENT DUE TO RELIGIOUS BELIEFS ?OTHER PRODUCTS FOR PRX DONT HAVE ANTIDOTES ? ? ? ?ENDO DKA RESOLVED ?- ICU hypoglycemic\Hyperglycemia protocol ?-check FSBS per protocol ? ? ?GI ?GI PROPHYLAXIS as indicated ? ?NUTRITIONAL STATUS ?DIET-->TF's as tolerated ?Constipation protocol as indicated ?IR consulted for PEG 5/5 ? ? ?ELECTROLYTES ?-follow labs as needed ?-replace as needed ?-pharmacy consultation and following ? ? ?Best Practice (right click and "Reselect all SmartList Selections" daily)  ? ?Diet/type: NPO ?DVT prophylaxis: SCD ?GI prophylaxis: PPI ?Lines: N/A ?Foley:  N/A ?Code Status: DNR ? ? ? ?Labs   ?CBC: ?Recent Labs  ?Lab 11/04/21 ?7824 11/06/21 ?1137 11/06/21 ?1956  11/07/21 ?8934 11/10/21 ?0684  ?WBC 30.8* 16.8*  --  15.7* 10.7*  ?HGB 7.0* 6.2* 5.8* 9.3* 9.8*  ?HCT 21.0* 18.7* 17.7* 27.5* 30.0*  ?MCV 85.0 83.1  --  83.8 86.5  ?PLT 218 276  --  284 238  ? ? ? ?Basic Metabolic Panel: ?Recent Labs  ?Lab 11/04/21 ?0335 11/05/21 ?3317 11/06/21 ?4099  11/07/21 ?2780 11/08/21 ?0447 11/09/21 ?0451 11/10/21 ?1580  ?NA 132* 133* 136 135 135 131* 132*  ?K 4.0 3.2* 3.7 3.4* 3.7 4.0 4.0  ?CL 103 105 107 104 102 97* 100  ?CO2 21* _0 ?GLUCOSE 117*

## 2021-11-10 NOTE — H&P (Signed)
Chief Complaint: Patient was seen in consultation today for gastrostomy tube placement  at the request of Erin Fulling, MD  Referring Physician(s): Rodena Piety, MD  Supervising Physician: Pernell Dupre  Patient Status: [facility] - In-pt  History of Present Illness:  Christopher [name] is a 88 y.o. male with past medical history significant for DM type II, HTN, HLD, subdural hematoma secondary to fall, sepsis and metabolic encephalopathy.  Patient had recent fall off the toilet at home and was found unresponsive by family.  EMS intubated patient for airway protection and transported to ED.  Patient was found to have subdural hemorrhage with midline shift and DKA.  Patient underwent craniotomy and trach on 11/03/2021.  Patient remains encephalopathic with severe brain damage failing to wean from vent.  Patient's family wishes transfer to LTAC.  Dr. Belia Heman has referred patient to IR for gastrostomy tube placement.  CT abdomen shows patient's anatomy amendable to G-tube and was approved by Dr. Grace Isaac, IR.   Allergies: Beef-derived products, Hydrochlorothiazide, Penicillins, and Pork-derived products  Medications: Prior to Admission medications   Medication Sig Start Date End Date Taking? Authorizing Provider  amLODipine (NORVASC) 10 MG tablet Take 10 mg by mouth daily. 10/06/21  Yes [provider]  cephALEXin (KEFLEX) 500 MG capsule Take 500 mg by mouth 2 (two) times daily. 10/25/21  Yes [provider]  clopidogrel (PLAVIX) 75 MG tablet Take 75 mg by mouth daily. 10/06/21  Yes [provider]  donepezil (ARICEPT) 5 MG tablet Take 5 mg by mouth daily. 10/06/21  Yes [provider]  metoprolol succinate (TOPROL-XL) 100 MG 24 hr tablet Take 100 mg by mouth daily. 10/06/21  Yes [provider]  pravastatin (PRAVACHOL) 40 MG tablet Take 40 mg by mouth daily. 10/06/21  Yes [provider]  sertraline (ZOLOFT) 25 MG tablet Take 25 mg by mouth daily. 10/06/21  Yes  [provider]  TRADJENTA 5 MG TABS tablet Take 5 mg by mouth daily. 10/06/21  Yes [provider]  DUPIXENT 300 MG/2ML prefilled syringe Inject into the skin. Patient not taking: Reported on 11/01/2021 07/04/21   [provider]  JARDIANCE 10 MG TABS tablet Take 10 mg by mouth daily. 09/04/21   [provider]  metFORMIN (GLUCOPHAGE) 1000 MG tablet Take 1,000 mg by mouth 2 (two) times daily. Patient not taking: Reported on 11/01/2021 10/06/21   [provider]     No family history on file.  Social History   Socioeconomic History   Marital status: Married    Spouse name: Not on file   Number of children: Not on file   Years of education: Not on file   Highest education level: Not on file  Occupational History   Not on file  Tobacco Use   Smoking status: Not on file   Smokeless tobacco: Not on file  Substance and Sexual Activity   Alcohol use: Not on file   Drug use: Not on file   Sexual activity: Not on file  Other Topics Concern   Not on file  Social History Narrative   Not on file   Social Determinants of Health   Financial Resource Strain: Not on file  Food Insecurity: Not on file  Transportation Needs: Not on file  Physical Activity: Not on file  Stress: Not on file  Social Connections: Not on file    Review of Systems: A 12 point ROS discussed and pertinent positives are indicated in the HPI above.  All  other systems are negative.  Review of Systems  Unable to perform ROS: Acuity of condition   Vital Signs: BP (!) 144/71   Pulse 84   Temp 99.1 F (37.3 C)   Resp (!) 21   Ht 5\' 2"  (1.575 m)   Wt 137 lb 9.1 oz (62.4 kg)   SpO2 100%   BMI 25.16 kg/m   Physical Exam Vitals reviewed.  Constitutional:      General: He is not in acute distress.    Appearance: He is ill-appearing.  HENT:     Head:     Comments: Surgical dressing on place from craniotomy    Nose:     Comments: Coretrack in place    Mouth/Throat:      Comments: Trach in place to vent Eyes:     Extraocular Movements: Extraocular movements intact.     Pupils: Pupils are equal, round, and reactive to light.  Cardiovascular:     Rate and Rhythm: Normal rate and regular rhythm.  Pulmonary:     Effort: Pulmonary effort is normal. No respiratory distress.     Breath sounds: Rhonchi present.     Comments: Mechanically ventilated Musculoskeletal:     Right lower leg: No edema.     Left lower leg: No edema.  Skin:    General: Skin is warm and dry.    Imaging: DG Abd 1 View  Result Date: 11/04/2021 CLINICAL DATA:  Nasogastric tube placement. EXAM: ABDOMEN - 1 VIEW COMPARISON:  11/01/2021. FINDINGS: Feeding tube terminates in the stomach. Mild gaseous prominence of small bowel and colon. Small right pleural effusion. IMPRESSION: Feeding tube terminates in the stomach. Electronically Signed   By: Leanna Battles M.D.   On: 11/04/2021 14:44   DG Abd 1 View  Result Date: 11/01/2021 CLINICAL DATA:  OG tube placement EXAM: ABDOMEN - 1 VIEW COMPARISON:  None. FINDINGS: Limited radiograph of the lower chest and upper abdomen was obtained for the purposes of enteric tube localization. Enteric tube is seen coursing below the diaphragm with distal tip and side port terminating within the expected location of the gastric body. IMPRESSION: Enteric tube appropriately positioned within the gastric body. Electronically Signed   By: Duanne Guess D.O.   On: 11/01/2021 19:55   CT HEAD WO CONTRAST ( )  Result Date: 11/04/2021 CLINICAL DATA:  Subdural hematoma EXAM: CT HEAD WITHOUT CONTRAST TECHNIQUE: Contiguous axial images were obtained from the base of the skull through the vertex without intravenous contrast. RADIATION DOSE REDUCTION: This exam was performed according to the departmental dose-optimization program which includes automated exposure control, adjustment of the mA and/or kV according to patient size and/or use of iterative reconstruction  technique. COMPARISON:  CT head November 03, 2021. FINDINGS: Brain: Mildly improved size of a 1.8 cm maximal thickness right subdural collection (previously 2.1 cm) with anti-dependent pneumocephalus and drain in place. Mildly improved leftward midline shift, now 5 mm (previously 6 mm). No evidence of acute large vascular territory infarct, mass lesion, or progressive ventriculomegaly. Mild rounding of the left temporal horn, similar. Vascular: No hyperdense vessel identified. Calcific intracranial atherosclerosis. Skull: Right frontoparietal craniotomy. Sinuses/Orbits: No new abnormality. Other: No mastoid effusions. IMPRESSION: Slight improvement in size of a right frontoparietal subdural hematoma status post evacuation with slight improvement in left for midline shift, detailed above. No progressive ventriculomegaly. Electronically Signed   By: Feliberto Harts M.D.   On: 11/04/2021 09:47   CT HEAD WO CONTRAST ( )  Result Date: 11/03/2021 CLINICAL DATA:  Craniotomy,  post-op Subdural hematoma s/p craniotomy EXAM: CT HEAD WITHOUT CONTRAST TECHNIQUE: Contiguous axial images were obtained from the base of the skull through the vertex without intravenous contrast. RADIATION DOSE REDUCTION: This exam was performed according to the departmental dose-optimization program which includes automated exposure control, adjustment of the mA and/or kV according to patient size and/or use of iterative reconstruction technique. COMPARISON:  11/02/2021 FINDINGS: Brain: New postoperative changes of subdural hematoma evacuation. There is extra-axial fluid, air, and small residual hyperdense hemorrhage. Collection measures up to 2.1 cm in maximum thickness along the frontal convexity. A drain has also been placed. Remains mass effect on the underlying parenchyma. Leftward midline shift presently measures 6 mm (previously 8 mm). There is decreased trapping of the left lateral ventricle. No new loss of gray-white differentiation.  Vascular: No new findings. Skull: Right frontoparietal craniotomy. Sinuses/Orbits: No new abnormality. Other: New postoperative changes superficial to the craniotomy. IMPRESSION: New postoperative changes of right subdural hematoma evacuation and drain placement. Decreased midline shift and trapping of the left lateral ventricle. Electronically Signed   By: Guadlupe Spanish M.D.   On: 11/03/2021 14:35   CT HEAD WO CONTRAST ( )  Addendum Date: 11/02/2021   ADDENDUM REPORT: 11/02/2021 12:09 ADDENDUM: I was advised at 1146 hours that Santiago Glad MD in the ICU has reviewed this report and is currently discussing with Neurosurgery. Electronically Signed   By: Odessa Fleming M.D.   On: 11/02/2021 12:09   Result Date: 11/02/2021 CLINICAL DATA:  88 year old male with altered mental status. Large right subdural hematoma. EXAM: CT HEAD WITHOUT CONTRAST TECHNIQUE: Contiguous axial images were obtained from the base of the skull through the vertex without intravenous contrast. RADIATION DOSE REDUCTION: This exam was performed according to the departmental dose-optimization program which includes automated exposure control, adjustment of the mA and/or kV according to patient size and/or use of iterative reconstruction technique. COMPARISON:  Head CT 11/01/2021 and earlier. FINDINGS: Brain: Large hyperdense holo hemispheric right side subdural hematoma on coronal images measures up to 16 mm thickness now (series 4, image 35), 17 mm on axial images and is associated with leftward midline shift of 8-9 mm (previously up to 14 mm. Basilar cisterns also appears slightly improved now on sagittal image 30. Mass effect on the ventricles. No ventriculomegaly. No IVH or other new compartment of intracranial hemorrhage. No cortically based acute infarct identified. Stable gray-white matter differentiation throughout the brain. Vascular: Calcified atherosclerosis at the skull base. Skull: Stable. No skull fracture identified. Sinuses/Orbits:  Chronic left sphenoid sinusitis. Mild sinus mucosal thickening and/or fluid elsewhere. Tympanic cavities and mastoids remain clear. Other: Intubated. Retained secretions in the pharynx. Mild right posterior vertex scalp hematoma on series 3, image 70. No other acute orbit or scalp soft tissue finding. IMPRESSION: 1. Large hemispheric Right Side Subdural Hematoma has slightly regressed since yesterday, now up to 17 mm thickness with leftward midline shift of 8-9 mm (previously 20 mm and 14 mm, respectively). Basilar cistern patency also mildly improved. 2. No new intracranial abnormality. Electronically Signed: By: Odessa Fleming M.D. On: 11/02/2021 11:44   CT Head Wo Contrast  Result Date: 11/01/2021 CLINICAL DATA:  Mental status change, unknown cause EXAM: CT HEAD WITHOUT CONTRAST TECHNIQUE: Contiguous axial images were obtained from the base of the skull through the vertex without intravenous contrast. RADIATION DOSE REDUCTION: This exam was performed according to the departmental dose-optimization program which includes automated exposure control, adjustment of the mA and/or kV according to patient size and/or use of iterative reconstruction  technique. COMPARISON:  CT head April 21 23. FINDINGS: Brain: Increased size of a now large acute right cerebral convexity subdural hemorrhage measuring up to 2.0 cm. Resulting significant mass effect with 1.4 cm of leftward midline shift. None in the left temporal horn, compatible with ventricular entrapment. Smaller (5 mm) left cerebral convexity subdural collection, probably chronic subdural hematoma. No evidence of acute large vascular territory infarct. Chronic microvascular ischemic disease and atrophy. Vascular: No hyperdense vessel identified. Skull: No acute fracture. Sinuses/Orbits: Near complete left sphenoid sinus opacification. Otherwise, mild paranasal sinus mucosal thickening. No acute orbital findings. Other: No mastoid effusions. IMPRESSION: 1. Increased size of  a now large (2 cm thick) acute right cerebral convexity subdural hemorrhage. Significant associated mass effect with 1.4 cm of leftward midline shift. Recommend urgent neurosurgical consultation. 2. Rounding of the left temporal horn, compatible with ventricular entrapment. 3. Smaller (5 mm) left cerebral convexity subdural collection, probably chronic subdural hematoma. Findings discussed with Dr. Cyril Loosen via telephone at 2:40 p.m. Electronically Signed   By: Feliberto Harts M.D.   On: 11/01/2021 14:46   CT ABDOMEN PELVIS W CONTRAST  Result Date: 11/08/2021 CLINICAL DATA:  Peritonitis or perforation suspected EXAM: CT ABDOMEN AND PELVIS WITH CONTRAST TECHNIQUE: Multidetector CT imaging of the abdomen and pelvis was performed using the standard protocol following bolus administration of intravenous contrast. RADIATION DOSE REDUCTION: This exam was performed according to the departmental dose-optimization program which includes automated exposure control, adjustment of the mA and/or kV according to patient size and/or use of iterative reconstruction technique. CONTRAST:  OMNIPAQUE IOHEXOL 300 MG/ML  SOLN COMPARISON:  None Available. FINDINGS: Lower chest: Small right and trace left pleural effusions with adjacent basilar atelectasis. Coronary artery calcifications. Hepatobiliary: Small central liver cyst. Prior cholecystectomy with expected prominence of the biliary system. Pancreas: No pancreatic ductal dilation. Tiny 5 mm cystic lesion in the tail of the pancreas, likely a side branch IPMN (series 2, image 29). Spleen: Normal in size without focal abnormality. Adrenals/Urinary Tract: No hydronephrosis. There are 2 left upper pole renal cysts, largest measuring up to 4.2 cm. No hydronephrosis or nephrolithiasis. Posterior bladder wall thickening. There is a Foley catheter in place with balloon inflated within the prostate gland. Stomach/Bowel: There is a weighted tip enteric tube with tip in the proximal  duodenum. There is no evidence of bowel obstruction.The appendix is normal. There is a rectal tube in place. Scattered diverticula. Vascular/Lymphatic: Aortoiliac atherosclerosis. Mild celiac ostial stenosis. The SMA is patent. There is mild ostial stenosis of the IMA. No AAA. No lymphadenopathy. Reproductive: Enlarged and heterogeneous prostate gland. Other: No bowel containing hernia. No free air. There is nonspecific, trace fluid in the anterior pararenal space near the tail of the pancreas and inferior border of the spleen and near the splenic flexure, likely simply trace ascites. No focal fluid collection. Musculoskeletal: No acute osseous abnormality. No suspicious lytic or blastic lesions. Multilevel degenerative changes of the spine. Moderate bilateral hip osteoarthritis. IMPRESSION: Foley catheter balloon is inflated within an enlarged, heterogeneous prostate gland. Recommend repositioning. Small right and trace left pleural effusions. No other acute findings in the abdomen or pelvis. Electronically Signed   By: Caprice Renshaw M.D.   On: 11/08/2021 15:37   DG Chest Port 1 View  Result Date: 11/01/2021 CLINICAL DATA:  Status post intubation EXAM: PORTABLE CHEST 1 VIEW COMPARISON:  Chest x-ray dated October 24, 2021 FINDINGS: Interval placement of ET tube with tip positioned approximately 2.0 cm from the carina. Enteric tube coursing  below the diaphragm. Cardiac and mediastinal contours are unchanged. Mild bibasilar atelectasis. Trace right pleural effusion. IMPRESSION: Interval placement of ET tube with tip positioned approximately 2.0 cm from the carina. Electronically Signed   By: Allegra Lai M.D.   On: 11/01/2021 14:07   EEG adult  Result Date: 11/04/2021 Charlsie Quest, MD     11/04/2021  2:11 PM Patient Name: Tomasz Steeves MRN: [id number] Epilepsy Attending: Charlsie Quest Referring Physician/Provider: Salena Saner, MD Date: 11/04/2021 Duration: 24.55 mins Patient history: 88 year old man  with a large right-sided subdural hematoma status postevacuation and tracheostomy, also presented with DKA which is now being treated along with worsening leukocytosis.  EEG to evaluate for seizure Level of alertness: lethargic AEDs during EEG study: None Technical aspects: This EEG study was done with scalp electrodes positioned according to the 10-20 International system of electrode placement. Electrical activity was acquired at a sampling rate of 500Hz  and reviewed with a high frequency filter of 70Hz  and a low frequency filter of 1Hz . EEG data were recorded continuously and digitally stored. Description: EEG showed continuous low amplitude 2 to 3 Hz delta slowing in right hemisphere and 5 to 6 Hz theta slowing in left hemisphere.  Hyperventilation and photic stimulation were not performed.   ABNORMALITY - Continuous slow, generalized and lateralized right hemisphere IMPRESSION: This study is suggestive of cortical dysfunction in right hemisphere likely secondary to underlying structural abnormality as well as moderate to severe diffuse encephalopathy, nonspecific etiology.  No seizures or epileptiform discharges were seen throughout the recording. Priyanka Annabelle Harman    Labs:  CBC: Recent Labs    11/04/21 0315 11/06/21 1137 11/06/21 1956 11/07/21 0607 11/10/21 0537  WBC 30.8* 16.8*  --  15.7* 10.7*  HGB 7.0* 6.2* 5.8* 9.3* 9.8*  HCT 21.0* 18.7* 17.7* 27.5* 30.0*  PLT 218 276  --  284 238    COAGS: Recent Labs    11/01/21 1354 11/03/21 1810  INR 1.2 1.3*  APTT 32 42*    BMP: Recent Labs    11/07/21 0607 11/08/21 0313 11/09/21 0451 11/10/21 0537  NA 135 135 131* 132*  K 3.4* 3.7 4.0 4.0  CL 104 102 97* 100  CO2 24 26 25 22   GLUCOSE 234* 220* 177* 172*  BUN 21 22 20  26*  CALCIUM 7.4* 7.5* 7.9* 8.0*  CREATININE 0.66 0.60* 0.59* 0.62  GFRNONAA >60 >60 >60 >60    LIVER FUNCTION TESTS: Recent Labs    11/01/21 1354  BILITOT 0.5  AST 34  ALT 18  ALKPHOS 100  PROT 8.5*   ALBUMIN 3.8    TUMOR MARKERS: No results for input(s): AFPTM, CEA, CA199, CHROMGRNA in the last 8760 hours.  Assessment and Plan: History of DM type II, HTN, HLD, subdural hematoma secondary to fall, sepsis and metabolic encephalopathy.  Patient had recent fall off the toilet at home and was found unresponsive by family.  EMS intubated patient for airway protection and transported to ED.  Patient was found to have subdural hemorrhage with midline shift and DKA.  Patient underwent craniotomy and trach on 11/03/2021.  Patient remains encephalopathic with severe brain damage failing to wean from vent.  Patient's family wishes transfer to LTAC.  Dr. Belia Heman has referred patient to IR for gastrostomy tube placement.  CT abdomen shows patient's anatomy amendable to G-tube and was approved by Dr. Grace Isaac, IR.  Pt mechanically ventilated with trach in place.  He opens eyes but does not acknowledge presence.  Pt appears  to be in no distress.  Pt currently has no sedation.   Risks and benefits of image guided gastrostomy tube placement discussed with the patient's family including, but not limited to the need for a barium enema during the procedure, bleeding, infection, peritonitis and/or damage to adjacent structures.  All of the patient's family's questions were answered, patient is agreeable to proceed.  Consent signed and in chart.   Thank you for this interesting consult.  I greatly enjoyed meeting Lashawn Orrego and look forward to participating in their care.  A copy of this report was sent to the requesting provider on this date.  Electronically Signed: Shon Hough, NP 11/10/2021, 8:30 AM   I spent a total of 20 minutes in face to face in clinical consultation, greater than 50% of which was counseling/coordinating care for gastrostomy tube placement.

## 2021-11-10 NOTE — Consult Note (Signed)
PHARMACY CONSULT NOTE ? ?Pharmacy Consult for Electrolyte Monitoring and Replacement  ? ?Recent Labs: ?Potassium (mmol/L)  ?Date Value  ?11/10/2021 4.0  ? ?Magnesium (mg/dL)  ?Date Value  ?11/10/2021 2.0  ? ?Calcium (mg/dL)  ?Date Value  ?11/10/2021 8.0 (L)  ? ?Albumin (g/dL)  ?Date Value  ?11/01/2021 3.8  ? ?Phosphorus (mg/dL)  ?Date Value  ?11/10/2021 2.4 (L)  ? ?Sodium (mmol/L)  ?Date Value  ?11/10/2021 132 (L)  ? ?Assessment: ?Patient is a 88 y/o M with medical history including no pertinent past medical history on file who is admitted after fall complicated by large subdural hematoma. Patient is status post tracheostomy on 4/29 and craniotomy with hematoma evacuation on 5/1. Patient is admitted to the ICU on trach collar. Pharmacy consulted to assist with electrolyte monitoring and replacement as indicated. ? ?Nutrition:  Vital 1.2@55ml /hr continuous  ?--Free water flushes 53ml q4 hours to maintain tube patency  ? ?Goal of Therapy:  ?Electrolytes within normal limits ? ?Plan:  ?--PhosNak 2 packets(via tube) x 1 ordered ?--Follow-up electrolytes with AM labs tomorrow ? ?Kaci Freel A Lonnetta Kniskern ?11/10/2021 ?8:53 AM ?

## 2021-11-11 ENCOUNTER — Inpatient Hospital Stay: Payer: Medicare HMO | Admitting: Radiology

## 2021-11-11 DIAGNOSIS — G939 Disorder of brain, unspecified: Secondary | ICD-10-CM | POA: Diagnosis not present

## 2021-11-11 HISTORY — PX: IR GASTROSTOMY TUBE MOD SED: IMG625

## 2021-11-11 LAB — CBC
HCT: 32.5 % — ABNORMAL LOW (ref 39.0–52.0)
Hemoglobin: 10.4 g/dL — ABNORMAL LOW (ref 13.0–17.0)
MCH: 27.9 pg (ref 26.0–34.0)
MCHC: 32 g/dL (ref 30.0–36.0)
MCV: 87.1 fL (ref 80.0–100.0)
Platelets: 166 10*3/uL (ref 150–400)
RBC: 3.73 MIL/uL — ABNORMAL LOW (ref 4.22–5.81)
RDW: 14.5 % (ref 11.5–15.5)
WBC: 12.5 10*3/uL — ABNORMAL HIGH (ref 4.0–10.5)
nRBC: 0 % (ref 0.0–0.2)

## 2021-11-11 LAB — GLUCOSE, CAPILLARY
Glucose-Capillary: 106 mg/dL — ABNORMAL HIGH (ref 70–99)
Glucose-Capillary: 117 mg/dL — ABNORMAL HIGH (ref 70–99)
Glucose-Capillary: 122 mg/dL — ABNORMAL HIGH (ref 70–99)
Glucose-Capillary: 126 mg/dL — ABNORMAL HIGH (ref 70–99)
Glucose-Capillary: 145 mg/dL — ABNORMAL HIGH (ref 70–99)
Glucose-Capillary: 88 mg/dL (ref 70–99)
Glucose-Capillary: 94 mg/dL (ref 70–99)

## 2021-11-11 LAB — BASIC METABOLIC PANEL
Anion gap: 7 (ref 5–15)
BUN: 22 mg/dL (ref 8–23)
CO2: 26 mmol/L (ref 22–32)
Calcium: 8.2 mg/dL — ABNORMAL LOW (ref 8.9–10.3)
Chloride: 100 mmol/L (ref 98–111)
Creatinine, Ser: 0.55 mg/dL — ABNORMAL LOW (ref 0.61–1.24)
GFR, Estimated: >60 mL/min (ref >60)
Glucose, Bld: 96 mg/dL (ref 70–99)
Potassium: 4.2 mmol/L (ref 3.5–5.1)
Sodium: 133 mmol/L — ABNORMAL LOW (ref 135–145)

## 2021-11-11 LAB — PHOSPHORUS: Phosphorus: 3.2 mg/dL (ref 2.5–4.6)

## 2021-11-11 LAB — CULTURE, RESPIRATORY W GRAM STAIN: Culture: NORMAL

## 2021-11-11 LAB — MAGNESIUM: Magnesium: 1.9 mg/dL (ref 1.7–2.4)

## 2021-11-11 MED ORDER — MIDAZOLAM HCL 2 MG/2ML IJ SOLN
INTRAMUSCULAR | Status: AC | PRN
  Administered 2021-11-11: 1 mg via INTRAVENOUS

## 2021-11-11 MED ORDER — MIDAZOLAM HCL 2 MG/2ML IJ SOLN
INTRAMUSCULAR | Status: AC
  Filled 2021-11-11: qty 2

## 2021-11-11 MED ORDER — FENTANYL CITRATE (PF) 100 MCG/2ML IJ SOLN
INTRAMUSCULAR | Status: AC
Start: 2021-11-11 — End: 2021-11-12
  Filled 2021-11-11: qty 2

## 2021-11-11 MED ORDER — FENTANYL CITRATE (PF) 100 MCG/2ML IJ SOLN
INTRAMUSCULAR | Status: AC | PRN
  Administered 2021-11-11: 25 ug via INTRAVENOUS

## 2021-11-11 MED ORDER — IOHEXOL 350 MG/ML SOLN
10.0000 mL | Freq: Once | INTRAVENOUS | Status: AC | PRN
  Administered 2021-11-11: 10 mL

## 2021-11-11 MED ORDER — GLUCAGON HCL RDNA (DIAGNOSTIC) 1 MG IJ SOLR
INTRAMUSCULAR | Status: AC
  Filled 2021-11-11: qty 1

## 2021-11-11 MED ORDER — LIDOCAINE HCL 1 % IJ SOLN
INTRAMUSCULAR | Status: AC
  Administered 2021-11-11: 20 mL
  Filled 2021-11-11: qty 20

## 2021-11-11 MED ORDER — GLUCAGON HCL RDNA (DIAGNOSTIC) 1 MG IJ SOLR
INTRAMUSCULAR | Status: AC | PRN
  Administered 2021-11-11: 1 mg via INTRAVENOUS

## 2021-11-11 NOTE — Progress Notes (Signed)
? ?NAME:  Christopher [name], MRN:  [id number], DOB:  [birth year], LOS: 10 ?ADMISSION DATE:  11/01/2021, CONSULTATION DATE:  11/01/21 ?REFERRING MD:  Dr. Corky Downs CHIEF COMPLAINT:  Unresponsive  ? ?History of Present Illness:  ?88 y.o. Male admitted with Acute Metabolic Encephalopathy due to DKA and in setting of large subdural hematoma with associated mass effect with left midline shift.  Pt deemed not a surgical candidate by Neurosurgery on admission but then taken for craniotomy and tracheostomy on 4/30.  ? ?Pertinent  Medical History  ?DMII ?HTN ?HLD ? ?Significant Hospital Events: ?Including procedures, antibiotic start and stop dates in addition to other pertinent events   ?4/28: Presented to ER unresponsive, fixed pupils, posturing.  Intubated for airway protection.  Found to have large subdural hematoma with mass effect and left shift.  Deemed not a surgical candidate by neurosurgery.  Recommended comfort care, however family wants all aggressive measures.  PCCM asked to admit ?4/29 remains encephalopathic, poor prognosis ?4/30 s/p CRANIOTOMY AND s/p TRACH ?5/01 CNS drain removed by neurosurg ?5/02 Dr. Rory Percy met with family, pt remains encephalopathic ?5/3 remains encephalopathic, severe brain damage ?5/4 left sided plegia, follows commands with family, severe brain damage ?5/5 PS mode at 15 then 12 left sided plegia, follows commands with family, severe brain damage ?5/6 failed weaning trials increased secretions ?5/7 secretions, LEFT Sided weakness, failure to wean from vent ? ?Interim History / Subjective:  ? ?Plan for PEG today ? ?Objective   ?Blood pressure 130/68, pulse 67, temperature 98.6 ?F (37 ?C), resp. rate 13, height 5' 2.01" (1.575 m), weight 61.2 kg, SpO2 100 %. ?   ?Vent Mode: PSV ?FiO2 (%):  [28 %] 28 % ?PEEP:  [5 cmH20] 5 cmH20 ?Pressure Support:  [10 cmH20] 10 cmH20  ? ?Intake/Output Summary (Last 24 hours) at 11/11/2021 0835 ?Last data filed at 11/11/2021 0750 ?Gross per 24 hour  ?Intake 610 ml  ?Output  2115 ml  ?Net -1505 ml  ? ?Filed Weights  ? 11/08/21 0413 11/09/21 0500 11/11/21 0500  ?Weight: 61 kg 62.4 kg 61.2 kg  ? ? ?Examination: ?General: elderly male, no acute distress, trach in place ?HENT: Surgical dressing in place from craniotomy, moist mucous membranes, sclera anicteric ?Lungs: course breath sounds on left, no wheezing or rhonchi ?Cardiovascular: rrr, no murmurs ?Abdomen: soft, BS+, non-distended ?Extremities: warm, no edema ?Neuro: withdraws to pain, follows commands for RUE and RLE, left hemiplegia ?GU: n/a ? ?Resolved Hospital Problem list   ? ?DKA ? ?Assessment & Plan:  ? ?Acute Hypoxemic Respiratory Failure s/p tracheostomy ?In setting of intracranial bleed ?- Continue PSV support and transition to trach collar as able ?- Routine tracheostomy care per RT ? ?Subdural hemorrhage with midline shift s/p craniotomy ?- Neurosurgery following ? ?DMII ?-SSI ? ?Nutrition ?- PEG tube placement today per IR ?- resume tube feeds when able ? ? ?Best Practice (right click and "Reselect all SmartList Selections" daily)  ? ?Diet/type: NPO ?DVT prophylaxis: SCD ?GI prophylaxis: PPI ?Lines: N/A ?Foley:  N/A ?Code Status:  full code ?Last date of multidisciplinary goals of care discussion [n/a] ? ?Labs   ?CBC: ?Recent Labs  ?Lab 11/06/21 ?1137 11/06/21 ?1956 11/07/21 ?0607 11/10/21 ?9163 11/11/21 ?0505  ?WBC 16.8*  --  15.7* 10.7* 12.5*  ?HGB 6.2* 5.8* 9.3* 9.8* 10.4*  ?HCT 18.7* 17.7* 27.5* 30.0* 32.5*  ?MCV 83.1  --  83.8 86.5 87.1  ?PLT 276  --  284 238 166  ? ? ?Basic Metabolic Panel: ?Recent Labs  ?Lab 11/05/21 ?  1194 11/06/21 ?1740 11/07/21 ?8144 11/08/21 ?8185 11/09/21 ?0451 11/10/21 ?6314 11/11/21 ?0505  ?NA 133* 136 135 135 131* 132* 133*  ?K 3.2* 3.7 3.4* 3.7 4.0 4.0 4.2  ?CL 105 107 104 102 97* 100 100  ?CO2 $Rem'22 23 24 26 25 22 26  'SKTi$ ?GLUCOSE 159* 234* 234* 220* 177* 172* 96  ?BUN $Rem'19 22 21 22 20 'vVVU$ 26* 22  ?CREATININE 0.79 0.76 0.66 0.60* 0.59* 0.62 0.55*  ?CALCIUM 7.6* 7.7* 7.4* 7.5* 7.9* 8.0* 8.2*  ?MG 1.9  2.0 1.8  --   --  2.0 1.9  ?PHOS 1.6* 1.7* 2.3* 1.9* 2.7 2.4* 3.2  ? ?GFR: ?Estimated Creatinine Clearance: 44.6 mL/min (A) (by C-G formula based on SCr of 0.55 mg/dL (L)). ?Recent Labs  ?Lab 11/06/21 ?1137 11/07/21 ?0607 11/08/21 ?0313 11/10/21 ?9702 11/11/21 ?0505  ?PROCALCITON 0.20  --  0.11  --   --   ?WBC 16.8* 15.7*  --  10.7* 12.5*  ? ? ?Liver Function Tests: ?No results for input(s): AST, ALT, ALKPHOS, BILITOT, PROT, ALBUMIN in the last 168 hours. ?No results for input(s): LIPASE, AMYLASE in the last 168 hours. ?No results for input(s): AMMONIA in the last 168 hours. ? ?ABG ?   ?Component Value Date/Time  ? PHART 7.36 11/01/2021 1354  ? PCO2ART 30 (L) 11/01/2021 1354  ? PO2ART 172 (H) 11/01/2021 1354  ? HCO3 16.9 (L) 11/01/2021 1354  ? ACIDBASEDEF 7.3 (H) 11/01/2021 1354  ? O2SAT 99.9 11/01/2021 1354  ?  ? ?Coagulation Profile: ?No results for input(s): INR, PROTIME in the last 168 hours. ? ?Cardiac Enzymes: ?No results for input(s): CKTOTAL, CKMB, CKMBINDEX, TROPONINI in the last 168 hours. ? ?HbA1C: ?No results found for: HGBA1C ? ?CBG: ?Recent Labs  ?Lab 11/10/21 ?1948 11/10/21 ?2320 11/11/21 ?0352 11/11/21 ?6378 11/11/21 ?0719  ?GLUCAP 185* 172* 88 94 106*  ? ? ?Critical care time: 40 minutes ?  ? ?Freda Jackson, MD ?[facility] ?Office: [phone] ? ? ?See Amion for personal pager ?PCCM on call pager [phone] until 7pm. ?Please call Elink 7p-7a. [phone] ? ? ?  ?

## 2021-11-11 NOTE — Consult Note (Addendum)
PHARMACY CONSULT NOTE ? ?Pharmacy Consult for Electrolyte Monitoring and Replacement  ? ?Recent Labs: ?Potassium (mmol/L)  ?Date Value  ?11/11/2021 4.2  ? ?Magnesium (mg/dL)  ?Date Value  ?11/11/2021 1.9  ? ?Calcium (mg/dL)  ?Date Value  ?11/11/2021 8.2 (L)  ? ?Albumin (g/dL)  ?Date Value  ?11/01/2021 3.8  ? ?Phosphorus (mg/dL)  ?Date Value  ?11/11/2021 3.2  ? ?Sodium (mmol/L)  ?Date Value  ?11/11/2021 133 (L)  ? ?Assessment: ?Patient is a 88 y/o M with medical history including no pertinent past medical history on file who is admitted after fall complicated by large subdural hematoma. Patient is status post tracheostomy on 4/29 and craniotomy with hematoma evacuation on 5/1. Patient is admitted to the ICU on trach collar. Pharmacy consulted to assist with electrolyte monitoring and replacement as indicated. ? ?Nutrition:  Vital 1.2 at 55 mL/hr + free water flushes 30 mL q4h ? ?Patient is pending PEG tube placement ? ?Goal of Therapy:  ?Electrolytes within normal limits ? ?Plan:  ?--No electrolyte replacement indicated at this time ?--Labs stabilizing. Will order lab checks q48h at this point ? ?Tressie Ellis ?11/11/2021 ?7:40 AM ?

## 2021-11-11 NOTE — Progress Notes (Signed)
Pt transferred to IR on vent and back to CCU without any issues.  ?

## 2021-11-11 NOTE — TOC Progression Note (Signed)
Transition of Care (TOC) - Progression Note  ? ? ?Patient Details  ?Name: Christopher [name] ?MRN: [id number] ?Date of Birth: September 06, 1925 ? ?Transition of Care (TOC) CM/SW Contact  ?Allayne Butcher, RN ?Phone Number: ?11/11/2021, 12:00 PM ? ?Clinical Narrative:    ?Jenn with [facility] reached out and after reviewing the patient's clinicals their Medical Director is unable to offer a bed.  RNCM will follow up with [facility] to see if they may be able to offer.  ? ? ?Expected Discharge Plan: Long Term Acute Care (LTAC) ?Barriers to Discharge: Continued Medical Work up ? ?Expected Discharge Plan and Services ?Expected Discharge Plan: Long Term Acute Care (LTAC) ?  ?Discharge Planning Services: CM Consult ?Post Acute Care Choice: Long Term Acute Care (LTAC) ?Living arrangements for the past 2 months: Single Family Home ?                ?DME Arranged: N/A ?DME Agency: NA ?  ?  ?  ?HH Arranged: NA ?HH Agency: NA ?  ?  ?  ? ? ?Social Determinants of Health (SDOH) Interventions ?  ? ?Readmission Risk Interventions ?   ? View : No data to display.  ?  ?  ?  ? ? ?

## 2021-11-11 NOTE — Progress Notes (Addendum)
Nutrition Follow Up Note  ? ?DOCUMENTATION CODES:  ? ?Not applicable ? ?INTERVENTION:  ? ?Once tube feeds able to be resumed: ? ?Initiate Osmolite 1.2@60ml /hr continuous  ? ?Free water flushes 55ml q4 hours to maintain tube patency  ? ?Regimen provides 1728kcal/day, 80g/day protein and 1356ml/day of free water.  ? ?NUTRITION DIAGNOSIS:  ? ?Inadequate oral intake related to acute illness as evidenced by NPO status. ? ?GOAL:  ? ?Patient will meet greater than or equal to 90% of their needs ?-met ? ?MONITOR:  ? ?Vent status, Labs, Weight trends, TF tolerance, Skin, I & O's ? ?ASSESSMENT:  ? ?88 year old man with past history of type 2 diabetes, dementia, hypertension, hyperlipidemia, subdural hematoma secondary to falls with a recent discharge from the hospital on 10/25/2021 where he had right subdural acute/early subacute hematoma and sepsis related to UTI, was managed conservatively and discharged home on 10/25/2021. Pt now returns back on 11/01/2021 with unresponsiveness and was found to have large SDH with L midline shift now s/p craniotomy 4/30 and DKA. ? ?Pt s/p craniotomy 4/30 ?Pt s/p tracheostomy 4/29 ?Pt s/p IR 20 Fr balloon retention G-tube today ? ?Pt able to follow commands. Pt remains ventilated via trach. Pt s/p IR G-tube placement today. Will plan to resume tube feeds once appropriate (likely tomorrow). Pt tolerating tube feeds via NGT prior to NPO. Refeed labs stable. Per chart, pt is up ~8lbs since admission. Pt +581ml on his I & Os. Plan is for [facility] at discharge.   ? ?Medications reviewed and include: insulin, protonix ? ?Labs reviewed: Na 133(L), K 4.2 wnl, creat 0.55(L), P 3.2 wnl, Mg 1.9 wnl ?Wbc- 12.5(H), Hgb 10.4(L), Hct 32.5(L) ?Cbgs- 117, 106, 94, 88 x 24 hrs ? ?Patient is currently intubated on ventilator support ?MV: 8.0 L/min ?Temp (24hrs), Avg:99.2 ?F (37.3 ?C), Min:98.4 ?F (36.9 ?C), Max:100 ?F (37.8 ?C) ? ?Propofol: none  ? ?MAP- >32mmHg  ? ?UOP- 1760ml  ? ?Diet Order:   ?Diet Order   ? ?        ?  Diet NPO time specified  Diet effective midnight       ?  ? ?  ?  ? ?  ? ?EDUCATION NEEDS:  ? ?No education needs have been identified at this time ? ?Skin:  Skin Assessment: Reviewed RN Assessment (incision head and neck) ? ?Last BM:  5/8- type 7 ? ?Height:  ? ?Ht Readings from Last 1 Encounters:  ?11/11/21 5' 2.01" (1.575 m)  ? ? ?Weight:  ? ?Wt Readings from Last 1 Encounters:  ?11/11/21 61.2 kg  ? ? ?Ideal Body Weight:  53.6 kg ? ?BMI:  Body mass index is 24.67 kg/m?. ? ?Estimated Nutritional Needs:  ? ?Kcal:  1600-1800kcal/day ? ?Protein:  80-90g/day ? ?Fluid:  1.4-1.6L/day ? ?Koleen Distance MS, RD, LDN ?Please refer to AMION for RD and/or RD on-call/weekend/after hours pager ? ?

## 2021-11-11 NOTE — Procedures (Signed)
Interventional Radiology Procedure Note ? ?Date of Procedure: 11/11/2021  ?Procedure: G tube placement  ? ?Findings:  ?1. Successful G tube placement via fluoro guidance (20 Fr balloon retention)   ? ?Complications: No immediate complications noted.  ? ?Estimated Blood Loss: minimal ? ?Follow-up and Recommendations: ?1. Refer to EMR orders  ? ? ?Olive Bass, MD  ?Vascular & Interventional Radiology  ?11/11/2021 2:51 PM ? ? ? ?

## 2021-11-11 NOTE — Plan of Care (Signed)
?  Problem: Education: ?Goal: Knowledge of General Education information will improve ?Description: Including pain rating scale, medication(s)/side effects and non-pharmacologic comfort measures ?Outcome: Progressing ?  ?Problem: Clinical Measurements: ?Goal: Ability to maintain clinical measurements within normal limits will improve ?Outcome: Progressing ?Goal: Will remain free from infection ?Outcome: Progressing ?Goal: Diagnostic test results will improve ?Outcome: Progressing ?Goal: Respiratory complications will improve ?Outcome: Progressing ?Goal: Cardiovascular complication will be avoided ?Outcome: Progressing ?  ?Problem: Nutrition: ?Goal: Adequate nutrition will be maintained ?Outcome: Progressing ?  ?Problem: Coping: ?Goal: Level of anxiety will decrease ?Outcome: Progressing ?  ?Problem: Elimination: ?Goal: Will not experience complications related to bowel motility ?Outcome: Progressing ?Goal: Will not experience complications related to urinary retention ?Outcome: Progressing ?  ?Problem: Pain Managment: ?Goal: General experience of comfort will improve ?Outcome: Progressing ?  ?Problem: Skin Integrity: ?Goal: Risk for impaired skin integrity will decrease ?Outcome: Progressing ?  ?

## 2021-11-12 DIAGNOSIS — G939 Disorder of brain, unspecified: Secondary | ICD-10-CM | POA: Diagnosis not present

## 2021-11-12 LAB — GLUCOSE, CAPILLARY
Glucose-Capillary: 124 mg/dL — ABNORMAL HIGH (ref 70–99)
Glucose-Capillary: 130 mg/dL — ABNORMAL HIGH (ref 70–99)
Glucose-Capillary: 132 mg/dL — ABNORMAL HIGH (ref 70–99)
Glucose-Capillary: 207 mg/dL — ABNORMAL HIGH (ref 70–99)
Glucose-Capillary: 228 mg/dL — ABNORMAL HIGH (ref 70–99)
Glucose-Capillary: 257 mg/dL — ABNORMAL HIGH (ref 70–99)

## 2021-11-12 MED ORDER — OSMOLITE 1.2 CAL PO LIQD
1000.0000 mL | ORAL | Status: DC
  Administered 2021-11-12: 1000 mL

## 2021-11-12 NOTE — Progress Notes (Signed)
Nutrition Follow Up Note  ? ?DOCUMENTATION CODES:  ? ?Not applicable ? ?INTERVENTION:  ? ?Initiate Osmolite 1.2_0 /hr continuous  ? ?Free water flushes 33m q4 hours to maintain tube patency  ? ?Regimen provides 1728kcal/day, 80g/day protein and 1362mday of free water.  ? ?NUTRITION DIAGNOSIS:  ? ?Inadequate oral intake related to acute illness as evidenced by NPO status. ? ?GOAL:  ? ?Patient will meet greater than or equal to 90% of their needs ?-met ? ?MONITOR:  ? ?Vent status, Labs, Weight trends, TF tolerance, Skin, I & O's ? ?ASSESSMENT:  ? ?[age]ear old man with past history of type 2 diabetes, dementia, hypertension, hyperlipidemia, subdural hematoma secondary to falls with a recent discharge from the hospital on 10/25/2021 where he had right subdural acute/early subacute hematoma and sepsis related to UTI, was managed conservatively and discharged home on 10/25/2021. Pt now returns back on 11/01/2021 with unresponsiveness and was found to have large SDH with L midline shift now s/p craniotomy 4/30 and DKA. ? ?Pt s/p craniotomy 4/30 ?Pt s/p tracheostomy 4/29 ?Pt s/p IR 20 Fr balloon retention G-tube today ? ?Pt undergoing trach collar trials today. G-tube in place; will restart tube feeds. Pt does not consume beef for religous reasons; will provide Osmolite and hold off on Pro[employer] TF at this time. Per chart, pt up ~8lbs since admission; pt -22323mn his I & Os.  ? ?Medications reviewed and include: insulin, protonix ? ?Labs reviewed: Na 133(L), K 4.2 wnl, creat 0.55(L), P 3.2 wnl, Mg 1.9 wnl- 5/8 ?Wbc- 12.5(H), Hgb 10.4(L), Hct 32.5(L)- 5/8 ?Cbgs- 124, 130, 132 x 24 hrs ? ?MAP- >82m21m ? ?UOP- 1600ml67m?Diet Order:   ?Diet Order   ? ?       ?  Diet NPO time specified  Diet effective midnight       ?  ? ?  ?  ? ?  ? ?EDUCATION NEEDS:  ? ?No education needs have been identified at this time ? ?Skin:  Skin Assessment: Reviewed RN Assessment (incision head and neck) ? ?Last BM:  5/8- type 7 ? ?Height:  ? ?Ht  Readings from Last 1 Encounters:  ?11/11/21 5' 2.01" (1.575 m)  ? ? ?Weight:  ? ?Wt Readings from Last 1 Encounters:  ?11/11/21 61.2 kg  ? ? ?Ideal Body Weight:  53.6 kg ? ?BMI:  Body mass index is 24.67 kg/m?. ? ?Estimated Nutritional Needs:  ? ?Kcal:  1600-1800kcal/day ? ?Protein:  80-90g/day ? ?Fluid:  1.4-1.6L/day ? ?CaseyKoleen DistanceRD, LDN ?Please refer to AMION for RD and/or RD on-call/weekend/after hours pager ? ?

## 2021-11-12 NOTE — Evaluation (Signed)
Physical Therapy Evaluation ?Patient Details ?Name: Christopher [name] ?MRN: [id number] ?DOB: [birth year] ?Today's Date: 11/12/2021 ? ?History of Present Illness ? 88 y.o. Male admitted with Acute Metabolic Encephalopathy due to DKA and in setting of large subdural hematoma with associated mass effect with left midline shift.  Pt deemed not a surgical candidate by Neurosurgery on admission but then taken for craniotomy and tracheostomy on 11/03/21. ?  ?Clinical Impression ? Pt alert, able to track PT on R side and to midline, L inattention noted but with tactile and visual cues able to look left. Per family at baseline pt was an independent, active man that lived with family.  ? ?Pt with eyes open majority of session, able to squeeze PT hand with RUE, followed commands ~50% of the session. Difficulty assessing true ability to follow commands due to pt's HOH, potential language barrier, versus true deficits. Pt able to initiate some movements with RLE, withdraws from noxious stimuli on both LLE and LUE. Pt noted to have subluxation of L shoulder, 1 finger width. maxAx2 to perform supine to sit, vitals WFLs, and pt able to move RLE and attempt to move LLE towards EOB. Pt progressed to occasional ability to sit with CGA, predominantly min-modA. Returned to supine totalAx2.  Overall the patient demonstrated deficits (see "PT Problem List") that impede the patient's functional abilities, safety, and mobility and would benefit from skilled PT intervention. Recommendation at this time is PT at a long-term acute care hospital.    ?   ? ?Recommendations for follow up therapy are one component of a multi-disciplinary discharge planning process, led by the attending physician.  Recommendations may be updated based on patient status, additional functional criteria and insurance authorization. ? ?Follow Up Recommendations PT at Long-term acute care hospital ? ?  ?Assistance Recommended at Discharge Frequent or constant  Supervision/Assistance  ?Patient can return home with the following ? Two people to help with walking and/or transfers;Two people to help with bathing/dressing/bathroom;Help with stairs or ramp for entrance;Assist for transportation;Assistance with feeding;Assistance with cooking/housework;Direct supervision/assist for financial management;Direct supervision/assist for medications management ? ?  ?Equipment Recommendations Other (comment) (TBD)  ?Recommendations for Other Services ?    ?  ?Functional Status Assessment Patient has had a recent decline in their functional status and demonstrates the ability to make significant improvements in function in a reasonable and predictable amount of time.  ? ?  ?Precautions / Restrictions Precautions ?Precautions: Fall ?Precaution Comments: G tube, trach collar, FMS ?Restrictions ?Weight Bearing Restrictions: No  ? ?  ? ?Mobility ? Bed Mobility ?Overal bed mobility: Needs Assistance ?Bed Mobility: Supine to Sit, Sit to Supine ?  ?  ?Supine to sit: Max assist, +2 for physical assistance, HOB elevated ?Sit to supine: Total assist, +2 for physical assistance ?  ?  ?  ? ?Transfers ?  ?  ?  ?  ?  ?  ?  ?  ?  ?  ?  ? ?Ambulation/Gait ?  ?  ?  ?  ?  ?  ?  ?  ? ?Stairs ?  ?  ?  ?  ?  ? ?Wheelchair Mobility ?  ? ?Modified Rankin (Stroke Patients Only) ?  ? ?  ? ?Balance Overall balance assessment: Needs assistance ?Sitting-balance support: Feet supported, Single extremity supported ?Sitting balance-Leahy Scale: Poor ?Sitting balance - Comments: progressed to occasionally sitting with CGA, predominantly min-modA. pt resistant to pertubations ?  ?  ?  ?  ?  ?  ?  ?  ?  ?  ?  ?  ?  ?  ?  ?   ? ? ? ?  Pertinent Vitals/Pain Pain Assessment ?Faces Pain Scale: Hurts a little bit ?Pain Descriptors / Indicators: Grimacing ?Pain Intervention(s): Limited activity within patient's tolerance, Repositioned  ? ? ?Home Living Family/patient expects to be discharged to:: Private residence ?Living  Arrangements: Children ?Available Help at Discharge: Family ?Type of Home: House ?Home Access: Stairs to enter ?Entrance Stairs-Rails: Right ?Entrance Stairs-Number of Steps: 6-7 ?  ?Home Layout: Multi-level ?Home Equipment: Cane - single Barista (2 wheels) ?   ?  ?Prior Function Prior Level of Function : Needs assist ?  ?  ?  ?  ?  ?  ?Mobility Comments: pt was ambulatory at baseline, lives with his daughter and son (who is mentally handicapped) ?ADLs Comments: per family abile to complete modI/I ?  ? ? ?Hand Dominance  ?   ? ?  ?Extremity/Trunk Assessment  ? Upper Extremity Assessment ?Upper Extremity Assessment: RUE deficits/detail;LUE deficits/detail ?RUE Deficits / Details: 2/5 grossly ?  ? ?Lower Extremity Assessment ?Lower Extremity Assessment: RLE deficits/detail;LLE deficits/detail ?RLE Deficits / Details: 3-/5 grossly ?LLE Deficits / Details: withdrawls to noxious stimuli. 1/5 quad ?  ? ?   ?Communication  ? Communication: Tracheostomy;HOH (native language is Urdu but family reports pt speaks english)  ?Cognition Arousal/Alertness: Awake/alert ?Behavior During Therapy: Flat affect ?Overall Cognitive Status: Difficult to assess ?  ?  ?  ?  ?  ?  ?  ?  ?  ?  ?  ?  ?  ?  ?  ?  ?General Comments: pt follows ~50 % of commands with increased following on R side. Limited by pt HOH and potential language barrier ?  ?  ? ?  ?General Comments General comments (skin integrity, edema, etc.): VSS ? ?  ?Exercises Other Exercises ?Other Exercises: supine heel slide on RLE x5 AAROM, seated LAQ on RLE x9  ? ?Assessment/Plan  ?  ?PT Assessment Patient needs continued PT services  ?PT Problem List Decreased strength;Decreased mobility;Decreased range of motion;Decreased activity tolerance;Decreased balance;Decreased knowledge of use of DME;Decreased knowledge of precautions;Decreased safety awareness ? ?   ?  ?PT Treatment Interventions DME instruction;Therapeutic exercise;Wheelchair mobility training;Gait  training;Balance training;Stair training;Neuromuscular re-education;Functional mobility training;Therapeutic activities;Patient/family education   ? ?PT Goals (Current goals can be found in the Care Plan section)  ?Acute Rehab PT Goals ?Patient Stated Goal: to improve as possible ?PT Goal Formulation: With family ?Time For Goal Achievement: 11/26/21 ?Potential to Achieve Goals: Fair ? ?  ?Frequency Min 2X/week ?  ? ? ?Co-evaluation   ?Reason for Co-Treatment: Complexity of the patient's impairments (multi-system involvement);Necessary to address cognition/behavior during functional activity;For patient/therapist safety;To address functional/ADL transfers ?PT goals addressed during session: Mobility/safety with mobility;Balance ?OT goals addressed during session: ADL's and self-care ?  ? ? ?  ?AM-PAC PT "6 Clicks" Mobility  ?Outcome Measure Help needed turning from your back to your side while in a flat bed without using bedrails?: Total ?Help needed moving from lying on your back to sitting on the side of a flat bed without using bedrails?: Total ?Help needed moving to and from a bed to a chair (including a wheelchair)?: Total ?Help needed standing up from a chair using your arms (e.g., wheelchair or bedside chair)?: Total ?Help needed to walk in hospital room?: Total ?Help needed climbing 3-5 steps with a railing? : Total ?6 Click Score: 6 ? ?  ?End of Session Equipment Utilized During Treatment: Gait belt ?Activity Tolerance: Patient tolerated treatment well ?Patient left: in bed;with call bell/phone within reach;with bed alarm set ?  Nurse Communication: Mobility status ?PT Visit Diagnosis: Other abnormalities of gait and mobility (R26.89);Difficulty in walking, not elsewhere classified (R26.2);Muscle weakness (generalized) (M62.81) ?  ? ?Time: [id number] ?PT Time Calculation (min) (ACUTE ONLY): 42 min ? ? ?Charges:   PT Evaluation ?$PT Eval High Complexity: 1 High ?PT Treatments ?$Therapeutic Activity: 23-37 mins ?   ?   ? ?Lieutenant Diego PT, DPT ?4:05 PM,11/12/21 ? ?

## 2021-11-12 NOTE — TOC Progression Note (Signed)
Transition of Care (TOC) - Progression Note  ? ? ?Patient Details  ?Name: Christopher [name] ?MRN: [id number] ?Date of Birth: 02-08-1926 ? ?Transition of Care (TOC) CM/SW Contact  ?Shelbie Hutching, RN ?Phone Number: ?11/12/2021, 1:22 PM ? ?Clinical Narrative:    ?[facility] was able to offer a bed and started insurance auth yesterday, still pending today.  RNCM spoke with patient's son yesterday afternoon- he was really wanting [facility] but verbalized understanding and accepted bed offer from [facility].  ? ? ?Expected Discharge Plan: [place] (LTAC) ?Barriers to Discharge: Continued Medical Work up ? ?Expected Discharge Plan and Services ?Expected Discharge Plan: [place] (LTAC) ?  ?Discharge Planning Services: CM Consult ?Post Acute Care Choice: Long Term Acute Care (LTAC) ?Living arrangements for the past 2 months: [place] ?                ?DME Arranged: N/A ?DME Agency: NA ?  ?  ?  ?HH Arranged: NA ?[place] Agency: NA ?  ?  ?  ? ? ?Social Determinants of Health (SDOH) Interventions ?  ? ?Readmission Risk Interventions ?   ? View : No data to display.  ?  ?  ?  ? ? ?

## 2021-11-12 NOTE — Progress Notes (Signed)
? ?NAME:  Christopher [name], MRN:  [id number], DOB:  06/26/1926, LOS: 11 ?ADMISSION DATE:  11/01/2021, CONSULTATION DATE:  11/01/21 ?REFERRING MD:  Dr. Corky Downs CHIEF COMPLAINT:  Unresponsive  ? ?History of Present Illness:  ?88 y.o. Male admitted with Acute Metabolic Encephalopathy due to DKA and in setting of large subdural hematoma with associated mass effect with left midline shift.  Pt deemed not a surgical candidate by Neurosurgery on admission but then taken for craniotomy and tracheostomy on 4/30.  ? ?Pertinent  Medical History  ?DMII ?HTN ?HLD ? ?Significant Hospital Events: ?Including procedures, antibiotic start and stop dates in addition to other pertinent events   ?4/28: Presented to ER unresponsive, fixed pupils, posturing.  Intubated for airway protection.  Found to have large subdural hematoma with mass effect and left shift.  Deemed not a surgical candidate by neurosurgery.  Recommended comfort care, however family wants all aggressive measures.  PCCM asked to admit ?4/29 remains encephalopathic, poor prognosis ?4/30 s/p CRANIOTOMY AND s/p TRACH ?5/01 CNS drain removed by neurosurg ?5/02 Dr. Rory Percy met with family, pt remains encephalopathic ?5/3 remains encephalopathic, severe brain damage ?5/4 left sided plegia, follows commands with family, severe brain damage ?5/5 PS mode at 15 then 12 left sided plegia, follows commands with family, severe brain damage ?5/6 failed weaning trials increased secretions ?5/7 secretions, LEFT Sided weakness, failure to wean from vent ?5/8 PEG placed ? ?Interim History / Subjective:  ? ?Received PEG yesterday ?No acute events overnight ?Placed on 5/5 this AM by RT ? ?Objective   ?Blood pressure 116/67, pulse 77, temperature 98.6 ?F (37 ?C), resp. rate 18, height 5' 2.01" (1.575 m), weight 61.2 kg, SpO2 100 %. ?   ?Vent Mode: PSV ?FiO2 (%):  [28 %] 28 % ?Set Rate:  [12 bmp] 12 bmp ?Vt Set:  [500 mL] 500 mL ?PEEP:  [5 cmH20] 5 cmH20 ?Pressure Support:  [10 cmH20] 10 cmH20   ? ?Intake/Output Summary (Last 24 hours) at 11/12/2021 0804 ?Last data filed at 11/12/2021 0522 ?Gross per 24 hour  ?Intake 60 ml  ?Output 1310 ml  ?Net -1250 ml  ? ?Filed Weights  ? 11/08/21 0413 11/09/21 0500 11/11/21 0500  ?Weight: 61 kg 62.4 kg 61.2 kg  ? ? ?Examination: ?General: elderly male, no acute distress, trach in place ?HENT: Surgical dressing in place from craniotomy, moist mucous membranes, sclera anicteric ?Lungs: course breath sounds on left, no wheezing or rhonchi ?Cardiovascular: rrr, no murmurs ?Abdomen: soft, BS+, non-distended ?Extremities: warm, no edema ?Neuro: alert, follows commands for RUE and RLE, left hemiplegia ?GU: n/a ? ?Resolved Hospital Problem list   ? ?DKA ? ?Assessment & Plan:  ? ?Acute Hypoxemic Respiratory Failure s/p tracheostomy ?In setting of intracranial bleed ?- Continue PSV support and transition to trach collar as able today ?- Appears to have good prognosis for vent weaning over time ?- Routine tracheostomy care per RT ? ?Subdural hemorrhage with midline shift s/p craniotomy ?Left Hemiplegia ?- Neurosurgery following ?- PT/OT consults, activity as tolerated ? ?DMII ?-SSI ? ?Nutrition ?- resume tube feeds today via PEG ? ?Disposition: Case management team working on [facility] placement for patient ? ?Best Practice (right click and "Reselect all SmartList Selections" daily)  ? ?Diet/type: NPO ?DVT prophylaxis: SCD ?GI prophylaxis: PPI ?Lines: N/A ?Foley:  N/A ?Code Status:  full code ?Last date of multidisciplinary goals of care discussion [n/a] ? ?Labs   ?CBC: ?Recent Labs  ?Lab 11/06/21 ?1137 11/06/21 ?1956 11/07/21 ?0607 11/10/21 ?1660 11/11/21 ?0505  ?WBC 16.8*  --  15.7* 10.7* 12.5*  ?HGB 6.2* 5.8* 9.3* 9.8* 10.4*  ?HCT 18.7* 17.7* 27.5* 30.0* 32.5*  ?MCV 83.1  --  83.8 86.5 87.1  ?PLT 276  --  284 238 166  ? ? ?Basic Metabolic Panel: ?Recent Labs  ?Lab 11/06/21 ?9924 11/07/21 ?2683 11/08/21 ?4196 11/09/21 ?0451 11/10/21 ?2229 11/11/21 ?0505  ?NA 136 135 135 131* 132* 133*   ?K 3.7 3.4* 3.7 4.0 4.0 4.2  ?CL 107 104 102 97* 100 100  ?CO2 _0 ?GLUCOSE 234* 234* 220* 177* 172* 96  ?BUN _1 26* 22  ?CREATININE 0.76 0.66 0.60* 0.59* 0.62 0.55*  ?CALCIUM 7.7* 7.4* 7.5* 7.9* 8.0* 8.2*  ?MG 2.0 1.8  --   --  2.0 1.9  ?PHOS 1.7* 2.3* 1.9* 2.7 2.4* 3.2  ? ?GFR: ?Estimated Creatinine Clearance: 44.6 mL/min (A) (by C-G formula based on SCr of 0.55 mg/dL (L)). ?Recent Labs  ?Lab 11/06/21 ?1137 11/07/21 ?0607 11/08/21 ?0313 11/10/21 ?7989 11/11/21 ?0505  ?PROCALCITON 0.20  --  0.11  --   --   ?WBC 16.8* 15.7*  --  10.7* 12.5*  ? ? ?Liver Function Tests: ?No results for input(s): AST, ALT, ALKPHOS, BILITOT, PROT, ALBUMIN in the last 168 hours. ?No results for input(s): LIPASE, AMYLASE in the last 168 hours. ?No results for input(s): AMMONIA in the last 168 hours. ? ?ABG ?   ?Component Value Date/Time  ? PHART 7.36 11/01/2021 1354  ? PCO2ART 30 (L) 11/01/2021 1354  ? PO2ART 172 (H) 11/01/2021 1354  ? HCO3 16.9 (L) 11/01/2021 1354  ? ACIDBASEDEF 7.3 (H) 11/01/2021 1354  ? O2SAT 99.9 11/01/2021 1354  ?  ? ?Coagulation Profile: ?No results for input(s): INR, PROTIME in the last 168 hours. ? ?Cardiac Enzymes: ?No results for input(s): CKTOTAL, CKMB, CKMBINDEX, TROPONINI in the last 168 hours. ? ?HbA1C: ?No results found for: HGBA1C ? ?CBG: ?Recent Labs  ?Lab 11/11/21 ?1132 11/11/21 ?1624 11/11/21 ?1937 11/11/21 ?2352 11/12/21 ?2119  ?GLUCAP 117* 126* 122* 145* 132*  ? ? ?Critical care time: 35 minutes ?  ? ?Freda Jackson, MD ?[facility] ?Office: [phone] ? ? ?See Amion for personal pager ?PCCM on call pager [phone] until 7pm. ?Please call Elink 7p-7a. [phone] ? ? ?  ?

## 2021-11-12 NOTE — Evaluation (Signed)
Occupational Therapy Evaluation ?Patient Details ?Name: Christopher [name] ?MRN: [id number] ?DOB: August 12, 1925 ?Today's Date: 11/12/2021 ? ? ?History of Present Illness 88 y.o. Male admitted with Acute Metabolic Encephalopathy due to DKA and in setting of large subdural hematoma with associated mass effect with left midline shift.  Pt deemed not a surgical candidate by Neurosurgery on admission but then taken for craniotomy and tracheostomy on 11/03/21.  ? ?Clinical Impression ?  ?Christopher [name] was seen for OT/PT co-evaluation this date. Prior to hospital admission, pt was MOD I for mobility and ADLs. Pt lives with children in home c 6 STE. Pt presents to acute OT demonstrating impaired ADL performance and functional mobility 2/2 decreased activity tolerance, poor command following, and impaired functional use of non-dominant LUE/LLE. Pt follows ~50% commands t/o session and tracks OT/PT and family. Pt waves good bye and mouths "thank you" at end of session.  ? ?Pt currently requires MOD A hand over hand oral care at bed level. MAX A don R sock, TOTAL A don L sock at bed level. MAX A x2 sup>sit and initial MAX A static sitting balance improving to MIN A. Pt would benefit from skilled OT to address noted impairments and functional limitations (see below for any additional details). [facility]  ? ?Recommendations for follow up therapy are one component of a multi-disciplinary discharge planning process, led by the attending physician.  Recommendations may be updated based on patient status, additional functional criteria and insurance authorization.  ? ?Follow Up Recommendations ? OT at Long-term acute care hospital  ?  ?Assistance Recommended at Discharge Frequent or constant Supervision/Assistance  ?Patient can return home with the following Two people to help with walking and/or transfers;Two people to help with bathing/dressing/bathroom ? ?  ?Functional Status Assessment ? Patient has had a  recent decline in their functional status and demonstrates the ability to make significant improvements in function in a reasonable and predictable amount of time.  ?Equipment Recommendations ? Hospital bed  ?  ?Recommendations for Other Services   ? ? ?  ?Precautions / Restrictions Precautions ?Precautions: Fall ?Precaution Comments: G tube, trach collar, FMS ?Restrictions ?Weight Bearing Restrictions: No  ? ?  ? ?Mobility Bed Mobility ?Overal bed mobility: Needs Assistance ?Bed Mobility: Supine to Sit, Sit to Supine ?  ?  ?Supine to sit: Max assist, +2 for physical assistance, HOB elevated ?Sit to supine: Total assist, +2 for physical assistance ?  ?  ?  ? ?Transfers ?  ?  ?  ?  ?  ?  ?  ?  ?  ?  ?  ? ?  ?Balance Overall balance assessment: Needs assistance ?Sitting-balance support: Feet supported, Bilateral upper extremity supported ?Sitting balance-Leahy Scale: Poor ?Sitting balance - Comments: initial MAX A improving to MIN A ?Postural control: Posterior lean ?  ?  ?  ?  ?  ?  ?  ?  ?  ?  ?  ?  ?  ?  ?   ? ?ADL either performed or assessed with clinical judgement  ? ?ADL Overall ADL's : Needs assistance/impaired ?  ?  ?  ?  ?  ?  ?  ?  ?  ?  ?  ?  ?  ?  ?  ?  ?  ?  ?  ?General ADL Comments: MOD A hand over hand oral care at bed level. MAX A don R sock, TOTAL A don L sock at bed level.  ? ? ? ?  Vision   ?Additional Comments: tracks to L side with cues  ?   ?   ?   ? ?Pertinent Vitals/Pain Pain Assessment ?Pain Assessment: Faces ?Faces Pain Scale: Hurts a little bit ?Pain Location: withdrawls to noxious stimuli ?Pain Descriptors / Indicators: Grimacing ?Pain Intervention(s): Limited activity within patient's tolerance, Repositioned  ? ? ? ?Hand Dominance   ?  ?Extremity/Trunk Assessment Upper Extremity Assessment ?Upper Extremity Assessment: RUE deficits/detail;LUE deficits/detail ?RUE Deficits / Details: 2/5 grossly ?  ?Lower Extremity Assessment ?Lower Extremity Assessment: RLE deficits/detail;LLE  deficits/detail ?RLE Deficits / Details: 3-/5 grossly ?LLE Deficits / Details: withdrawls to noxious stimuli. 1/5 quad ?  ?  ?  ?Communication Communication ?Communication: Tracheostomy;HOH (native language is Urdu but family reports pt speaks english) ?  ?Cognition Arousal/Alertness: Awake/alert ?Behavior During Therapy: Flat affect ?Overall Cognitive Status: Difficult to assess ?  ?  ?  ?  ?  ?  ?  ?  ?  ?  ?  ?  ?  ?  ?  ?  ?General Comments: pt follows ~50 % of commands with increased following on R side. Limited by pt HOH and potential language barrier ?  ?  ?General Comments  VSS ? ?  ?Exercises   ?  ?Shoulder Instructions    ? ? ?Home Living Family/patient expects to be discharged to:: Private residence ?Living Arrangements: Children ?Available Help at Discharge: Family ?Type of Home: House ?Home Access: Stairs to enter ?Entrance Stairs-Number of Steps: 6-7 ?Entrance Stairs-Rails: Right ?Home Layout: Multi-level ?  ?  ?  ?  ?  ?  ?  ?Home Equipment: Cane - single Librarian, academic (2 wheels) ?  ?  ?  ? ?  ?Prior Functioning/Environment Prior Level of Function : Needs assist ?  ?  ?  ?  ?  ?  ?Mobility Comments: pt was ambulatory at baseline, lives with his daughter and son (who is mentally handicapped) ?ADLs Comments: per family abile to complete modI/I ?  ? ?  ?  ?OT Problem List: Decreased strength;Decreased range of motion;Decreased activity tolerance;Decreased coordination;Decreased safety awareness;Decreased cognition;Impaired vision/perception;Impaired balance (sitting and/or standing);Impaired UE functional use ?  ?   ?OT Treatment/Interventions: Self-care/ADL training;Neuromuscular education;Therapeutic exercise;Energy conservation;DME and/or AE instruction;Therapeutic activities;Balance training;Patient/family education  ?  ?OT Goals(Current goals can be found in the care plan section) Acute Rehab OT Goals ?Patient Stated Goal: to improve functioning ?OT Goal Formulation: With patient/family ?Time  For Goal Achievement: 11/26/21 ?Potential to Achieve Goals: Fair ?ADL Goals ?Pt Will Perform Grooming: with set-up;with supervision;bed level ?Pt Will Perform Lower Body Dressing: with max assist;sitting/lateral leans ?Pt Will Transfer to Toilet: with max assist;stand pivot transfer;bedside commode  ?OT Frequency: Min 2X/week ?  ? ?Co-evaluation PT/OT/SLP Co-Evaluation/Treatment: Yes ?Reason for Co-Treatment: Complexity of the patient's impairments (multi-system involvement);Necessary to address cognition/behavior during functional activity;For patient/therapist safety;To address functional/ADL transfers ?PT goals addressed during session: Mobility/safety with mobility;Balance ?OT goals addressed during session: ADL's and self-care ?  ? ?  ?AM-PAC OT "6 Clicks" Daily Activity     ?Outcome Measure Help from another person eating meals?: Total ?Help from another person taking care of personal grooming?: A Lot ?Help from another person toileting, which includes using toliet, bedpan, or urinal?: A Lot ?Help from another person bathing (including washing, rinsing, drying)?: A Lot ?Help from another person to put on and taking off regular upper body clothing?: A Lot ?Help from another person to put on and taking off regular lower body clothing?: A Lot ?6 Click  Score: 11 ?  ?End of Session Equipment Utilized During Treatment: Oxygen ?Nurse Communication: Mobility status ? ?Activity Tolerance: Patient tolerated treatment well ?Patient left: in bed;with call bell/phone within reach;with bed alarm set;with family/visitor present;with nursing/sitter in room ? ?OT Visit Diagnosis: Muscle weakness (generalized) (M62.81);Other symptoms and signs involving the nervous system (R29.898);Hemiplegia and hemiparesis ?Hemiplegia - Right/Left: Left ?Hemiplegia - dominant/non-dominant: Non-Dominant ?Hemiplegia - caused by: Other cerebrovascular disease (traumatic SDH)  ?              ?Time: 1330-1409 ?OT Time Calculation (min): 39  min ?Charges:  OT General Charges ?$OT Visit: 1 Visit ?OT Evaluation ?$OT Eval High Complexity: 1 High ?OT Treatments ?$Self Care/Home Management : 8-22 mins ? ?Kathie Dike, M.S. OTR/L  ?11/12/21, 3:58 PM  ?ascom [phone]

## 2021-11-13 ENCOUNTER — Inpatient Hospital Stay: Payer: Medicare HMO

## 2021-11-13 DIAGNOSIS — Z7189 Other specified counseling: Secondary | ICD-10-CM

## 2021-11-13 DIAGNOSIS — Z515 Encounter for palliative care: Secondary | ICD-10-CM

## 2021-11-13 DIAGNOSIS — G939 Disorder of brain, unspecified: Secondary | ICD-10-CM | POA: Diagnosis not present

## 2021-11-13 LAB — CBC WITH DIFFERENTIAL/PLATELET
Abs Immature Granulocytes: 0.33 10*3/uL — ABNORMAL HIGH (ref 0.00–0.07)
Basophils Absolute: 0.1 10*3/uL (ref 0.0–0.1)
Basophils Relative: 0 %
Eosinophils Absolute: 0.1 10*3/uL (ref 0.0–0.5)
Eosinophils Relative: 1 %
HCT: 34.1 % — ABNORMAL LOW (ref 39.0–52.0)
Hemoglobin: 11.2 g/dL — ABNORMAL LOW (ref 13.0–17.0)
Immature Granulocytes: 2 %
Lymphocytes Relative: 8 %
Lymphs Abs: 1.2 10*3/uL (ref 0.7–4.0)
MCH: 27.7 pg (ref 26.0–34.0)
MCHC: 32.8 g/dL (ref 30.0–36.0)
MCV: 84.4 fL (ref 80.0–100.0)
Monocytes Absolute: 1.7 10*3/uL — ABNORMAL HIGH (ref 0.1–1.0)
Monocytes Relative: 10 %
Neutro Abs: 13 10*3/uL — ABNORMAL HIGH (ref 1.7–7.7)
Neutrophils Relative %: 79 %
Platelets: 343 10*3/uL (ref 150–400)
RBC: 4.04 MIL/uL — ABNORMAL LOW (ref 4.22–5.81)
RDW: 13.9 % (ref 11.5–15.5)
WBC: 16.3 10*3/uL — ABNORMAL HIGH (ref 4.0–10.5)
nRBC: 0 % (ref 0.0–0.2)

## 2021-11-13 LAB — BASIC METABOLIC PANEL
Anion gap: 6 (ref 5–15)
BUN: 28 mg/dL — ABNORMAL HIGH (ref 8–23)
CO2: 27 mmol/L (ref 22–32)
Calcium: 8.5 mg/dL — ABNORMAL LOW (ref 8.9–10.3)
Chloride: 100 mmol/L (ref 98–111)
Creatinine, Ser: 0.75 mg/dL (ref 0.61–1.24)
GFR, Estimated: >60 mL/min (ref >60)
Glucose, Bld: 257 mg/dL — ABNORMAL HIGH (ref 70–99)
Potassium: 4.3 mmol/L (ref 3.5–5.1)
Sodium: 133 mmol/L — ABNORMAL LOW (ref 135–145)

## 2021-11-13 LAB — GLUCOSE, CAPILLARY
Glucose-Capillary: 258 mg/dL — ABNORMAL HIGH (ref 70–99)
Glucose-Capillary: 209 mg/dL — ABNORMAL HIGH (ref 70–99)
Glucose-Capillary: 200 mg/dL — ABNORMAL HIGH (ref 70–99)
Glucose-Capillary: 197 mg/dL — ABNORMAL HIGH (ref 70–99)
Glucose-Capillary: 225 mg/dL — ABNORMAL HIGH (ref 70–99)
Glucose-Capillary: 254 mg/dL — ABNORMAL HIGH (ref 70–99)

## 2021-11-13 LAB — MAGNESIUM: Magnesium: 1.9 mg/dL (ref 1.7–2.4)

## 2021-11-13 LAB — PHOSPHORUS: Phosphorus: 2.9 mg/dL (ref 2.5–4.6)

## 2021-11-13 LAB — PROCALCITONIN: Procalcitonin: 0.35 ng/mL

## 2021-11-13 MED ORDER — INSULIN ASPART 100 UNIT/ML IJ SOLN
3.0000 [IU] | INTRAMUSCULAR | Status: DC
  Administered 2021-11-13 – 2021-11-14 (×4): 3 [IU] via SUBCUTANEOUS
  Filled 2021-11-13 (×4): qty 1

## 2021-11-13 MED ORDER — INSULIN DETEMIR 100 UNIT/ML ~~LOC~~ SOLN
9.0000 [IU] | Freq: Two times a day (BID) | SUBCUTANEOUS | Status: DC
  Administered 2021-11-13 – 2021-11-14 (×2): 9 [IU] via SUBCUTANEOUS
  Filled 2021-11-13 (×4): qty 0.09

## 2021-11-13 MED ORDER — IPRATROPIUM-ALBUTEROL 0.5-2.5 (3) MG/3ML IN SOLN
3.0000 mL | Freq: Four times a day (QID) | RESPIRATORY_TRACT | Status: DC
  Administered 2021-11-13 – 2021-11-14 (×4): 3 mL via RESPIRATORY_TRACT
  Filled 2021-11-13 (×4): qty 3

## 2021-11-13 NOTE — Consult Note (Signed)
PHARMACY CONSULT NOTE ? ?Pharmacy Consult for Electrolyte Monitoring and Replacement  ? ?Recent Labs: ?Potassium (mmol/L)  ?Date Value  ?11/13/2021 4.3  ? ?Magnesium (mg/dL)  ?Date Value  ?11/13/2021 1.9  ? ?Calcium (mg/dL)  ?Date Value  ?11/13/2021 8.5 (L)  ? ?Albumin (g/dL)  ?Date Value  ?11/01/2021 3.8  ? ?Phosphorus (mg/dL)  ?Date Value  ?11/13/2021 2.9  ? ?Sodium (mmol/L)  ?Date Value  ?11/13/2021 133 (L)  ? ?Assessment: ?Patient is a 88 y/o M with medical history including no pertinent past medical history on file who is admitted after fall complicated by large subdural hematoma. Patient is status post tracheostomy on 4/29 and craniotomy with hematoma evacuation on 5/1. Patient is admitted to the ICU on trach collar. Pharmacy consulted to assist with electrolyte monitoring and replacement as indicated. ? ?Nutrition:  Vital 1.2 at 55>60 mL/hr + free water flushes 30 mL q4h ? ?Patient is pending PEG tube placement ? ?Goal of Therapy:  ?Electrolytes within normal limits ? ?Plan:  ?--No electrolyte replacement indicated at this time & no med changes ?--Labs stabilizing. Will order lab checks q48h at this point ? ?Sherlynn Carbon Levone Otten ?11/13/2021 ?9:54 AM ?

## 2021-11-13 NOTE — Progress Notes (Signed)
? ?NAME:  Christopher [name], MRN:  [id number], DOB:  08-21-1925, LOS: 12 ?ADMISSION DATE:  11/01/2021, CONSULTATION DATE:  11/01/21 ?REFERRING MD:  Dr. Corky Downs CHIEF COMPLAINT:  Unresponsive  ? ?History of Present Illness:  ?88 y.o. Male admitted with Acute Metabolic Encephalopathy due to DKA and in setting of large subdural hematoma with associated mass effect with left midline shift.  Pt deemed not a surgical candidate by Neurosurgery on admission but then taken for craniotomy and tracheostomy on 4/30.  ? ?Pertinent  Medical History  ?DMII ?HTN ?HLD ? ?Significant Hospital Events: ?Including procedures, antibiotic start and stop dates in addition to other pertinent events   ?4/28: Presented to ER unresponsive, fixed pupils, posturing.  Intubated for airway protection.  Found to have large subdural hematoma with mass effect and left shift.  Deemed not a surgical candidate by neurosurgery.  Recommended comfort care, however family wants all aggressive measures.  PCCM asked to admit ?4/29 remains encephalopathic, poor prognosis ?4/30 s/p CRANIOTOMY AND s/p TRACH ?5/01 CNS drain removed by neurosurg ?5/02 Dr. Rory Percy met with family, pt remains encephalopathic ?5/3 remains encephalopathic, severe brain damage ?5/4 left sided plegia, follows commands with family, severe brain damage ?5/5 PS mode at 15 then 12 left sided plegia, follows commands with family, severe brain damage ?5/6 failed weaning trials increased secretions ?5/7 secretions, LEFT Sided weakness, failure to wean from vent ?5/8 PEG placed ?5/9 tolerating trach collar ? ?Interim History / Subjective:  ? ?TF resumed yesterday ?No acute events overnight ?Tolerated trach collar over past 24 hours ? ?Objective   ?Blood pressure (!) 108/55, pulse 80, temperature 99.3 ?F (37.4 ?C), resp. rate 19, height 5' 2.01" (1.575 m), weight 61.2 kg, SpO2 100 %. ?   ?Vent Mode: PSV ?FiO2 (%):  [25 %-28 %] 28 % ?PEEP:  [5 cmH20] 5 cmH20 ?Pressure Support:  [5 cmH20-10 cmH20] 5 cmH20   ? ?Intake/Output Summary (Last 24 hours) at 11/13/2021 0727 ?Last data filed at 11/13/2021 0600 ?Gross per 24 hour  ?Intake 662 ml  ?Output 1470 ml  ?Net -808 ml  ? ?Filed Weights  ? 11/08/21 0413 11/09/21 0500 11/11/21 0500  ?Weight: 61 kg 62.4 kg 61.2 kg  ? ? ?Examination: ?General: elderly male, no acute distress, trach in place ?HENT: Surgical dressing in place from craniotomy, moist mucous membranes, sclera anicteric ?Lungs: diminished breath sounds, no wheezing or rhonchi. Coughing up thick whitish secretions ?Cardiovascular: rrr, no murmurs ?Abdomen: soft, BS+, non-distended ?Extremities: warm, no edema ?Neuro: alert, follows commands for RUE and RLE, left hemiplegia ?GU: n/a ? ?Resolved Hospital Problem list   ? ?DKA ? ?Assessment & Plan:  ? ?Acute Hypoxemic Respiratory Failure s/p tracheostomy ?In setting of intracranial bleed ?- Continue trach collar. Has tolerated well over past 24 hours. ?- Routine tracheostomy care per RT ?- start duonebs for pulmonary hygiene given thick whitish secretions noted today. Check CBC w/ diff ?- Will check chest radiograph if further concerns for pneumonia ? ?Subdural hemorrhage with midline shift s/p craniotomy ?Left Hemiplegia ?- Neurosurgery following ?- PT/OT following with recommendations for further therapy at [facility] ? ?DMII ?-SSI ? ?Nutrition ?- continue tube feeds ? ?Disposition: Case management team working on [facility] placement for patient ? ?Best Practice (right click and "Reselect all SmartList Selections" daily)  ? ?Diet/type: tubefeeds ?DVT prophylaxis: SCD ?GI prophylaxis: PPI ?Lines: N/A ?Foley:  N/A ?Code Status:  full code ?Last date of multidisciplinary goals of care discussion [n/a] ? ?Labs   ?CBC: ?Recent Labs  ?Lab 11/06/21 ?1137  11/06/21 ?1956 11/07/21 ?2633 11/10/21 ?3545 11/11/21 ?0505  ?WBC 16.8*  --  15.7* 10.7* 12.5*  ?HGB 6.2* 5.8* 9.3* 9.8* 10.4*  ?HCT 18.7* 17.7* 27.5* 30.0* 32.5*  ?MCV 83.1  --  83.8 86.5 87.1  ?PLT 276  --  284 238 166  ? ? ?Basic  Metabolic Panel: ?Recent Labs  ?Lab 11/07/21 ?0607 11/08/21 ?0313 11/09/21 ?0451 11/10/21 ?6256 11/11/21 ?0505 11/13/21 ?0321  ?NA 135 135 131* 132* 133* 133*  ?K 3.4* 3.7 4.0 4.0 4.2 4.3  ?CL 104 102 97* 100 100 100  ?CO2 $Rem'24 26 25 22 26 27  'WPda$ ?GLUCOSE 234* 220* 177* 172* 96 257*  ?BUN $Rem'21 22 20 'dywQ$ 26* 22 28*  ?CREATININE 0.66 0.60* 0.59* 0.62 0.55* 0.75  ?CALCIUM 7.4* 7.5* 7.9* 8.0* 8.2* 8.5*  ?MG 1.8  --   --  2.0 1.9 1.9  ?PHOS 2.3* 1.9* 2.7 2.4* 3.2 2.9  ? ?GFR: ?Estimated Creatinine Clearance: 44.6 mL/min (by C-G formula based on SCr of 0.75 mg/dL). ?Recent Labs  ?Lab 11/06/21 ?1137 11/07/21 ?0607 11/08/21 ?0313 11/10/21 ?3893 11/11/21 ?0505  ?PROCALCITON 0.20  --  0.11  --   --   ?WBC 16.8* 15.7*  --  10.7* 12.5*  ? ? ?Liver Function Tests: ?No results for input(s): AST, ALT, ALKPHOS, BILITOT, PROT, ALBUMIN in the last 168 hours. ?No results for input(s): LIPASE, AMYLASE in the last 168 hours. ?No results for input(s): AMMONIA in the last 168 hours. ? ?ABG ?   ?Component Value Date/Time  ? PHART 7.36 11/01/2021 1354  ? PCO2ART 30 (L) 11/01/2021 1354  ? PO2ART 172 (H) 11/01/2021 1354  ? HCO3 16.9 (L) 11/01/2021 1354  ? ACIDBASEDEF 7.3 (H) 11/01/2021 1354  ? O2SAT 99.9 11/01/2021 1354  ?  ? ?Coagulation Profile: ?No results for input(s): INR, PROTIME in the last 168 hours. ? ?Cardiac Enzymes: ?No results for input(s): CKTOTAL, CKMB, CKMBINDEX, TROPONINI in the last 168 hours. ? ?HbA1C: ?No results found for: HGBA1C ? ?CBG: ?Recent Labs  ?Lab 11/12/21 ?1641 11/12/21 ?1930 11/12/21 ?2354 11/13/21 ?7342 11/13/21 ?0715  ?GLUCAP 207* 228* 257* 258* 209*  ? ? ?Critical care time: n/a ?  ? ?Freda Jackson, MD ?[facility] ?Office: [phone] ? ? ?See Amion for personal pager ?PCCM on call pager [phone] until 7pm. ?Please call Elink 7p-7a. [phone] ? ? ?  ?

## 2021-11-13 NOTE — Progress Notes (Signed)
Inpatient Diabetes Program Recommendations ? ?AACE/ADA: New Consensus Statement on Inpatient Glycemic Control (2015) ? ?Target Ranges:  Prepandial:   less than 140 mg/dL ?     Peak postprandial:   less than 180 mg/dL (1-2 hours) ?     Critically ill patients:  140 - 180 mg/dL  ? ?Lab Results  ?Component Value Date  ? GLUCAP 200 (H) 11/13/2021  ? ? ?Review of Glycemic Control ? Latest Reference Range & Units 11/12/21 16:41 11/12/21 19:30 11/12/21 23:54 11/13/21 03:32 11/13/21 07:15 11/13/21 11:36  ?Glucose-Capillary 70 - 99 mg/dL 207 (H) 228 (H) 257 (H) 258 (H) 209 (H) 200 (H)  ? ?Diabetes history: DM 2 ?Outpatient Diabetes medications:  ?Jardiance 10 mg daily ?Metformin 1000 mg bid ?Tradjenta 5 mg daily ?Current orders for Inpatient glycemic control:  ?Novolog sensitive q 4 hours ?Novolog 2 units q 4 hours ?Levemir 7 units bid ?Osmolite 60 ml/hr ? ?Inpatient Diabetes Program Recommendations:   ? ?May consider increasing Levemir to 9 units bid and increase Novolog tube feed coverage to 3 units q 4 hours.  ? ?Thanks,  ?Adah Perl, RN, BC-ADM ?Inpatient Diabetes Coordinator ?Pager [phone]  (8a-5p) ? ? ?

## 2021-11-13 NOTE — TOC Progression Note (Signed)
Transition of Care (TOC) - Progression Note  ? ? ?Patient Details  ?Name: Brycin Kille ?MRN: [id number] ?Date of Birth: 25-Aug-1925 ? ?Transition of Care (TOC) CM/SW Contact  ?Allayne Butcher, RN ?Phone Number: ?11/13/2021, 4:35 PM ? 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  [facility] will accept tomorrow.   ? ? ?Expected Discharge Plan: Long Term Acute Care (LTAC) ?Barriers to Discharge: Continued Medical Work up ? ?Expected Discharge Plan and Services ?Expected Discharge Plan: Long Term Acute Care (LTAC) ?  ?Discharge Planning Services: CM Consult ?Post Acute Care Choice: Long Term Acute Care (LTAC) ?Living arrangements for the past 2 months: Single Family Home ?                ?DME Arranged: N/A ?DME Agency: NA ?  ?  ?  ?HH Arranged: NA ?HH Agency: NA ?  ?  ?  ? ? ?Social Determinants of Health (SDOH) Interventions ?  ? ?Readmission Risk Interventions ?   ? View : No data to display.  ?  ?  ?  ? ? ?

## 2021-11-13 NOTE — Plan of Care (Signed)
VSS. Afebrile. Pt able to move LLE 1/5 to command this evening. LUE remains flaccid. Other neuro assessment unchanged. Tolerating TF well. Tolerating trach collar @5L  with small secretions. No acute events this shift.  ?Problem: Education: ?Goal: Knowledge of General Education information will improve ?Description: Including pain rating scale, medication(s)/side effects and non-pharmacologic comfort measures ?Outcome: Progressing ?  ?Problem: Clinical Measurements: ?Goal: Ability to maintain clinical measurements within normal limits will improve ?Outcome: Progressing ?Goal: Will remain free from infection ?Outcome: Progressing ?Goal: Diagnostic test results will improve ?Outcome: Progressing ?Goal: Respiratory complications will improve ?Outcome: Progressing ?Goal: Cardiovascular complication will be avoided ?Outcome: Progressing ?  ?Problem: Nutrition: ?Goal: Adequate nutrition will be maintained ?Outcome: Progressing ?  ?Problem: Coping: ?Goal: Level of anxiety will decrease ?Outcome: Progressing ?  ?Problem: Elimination: ?Goal: Will not experience complications related to bowel motility ?Outcome: Progressing ?Goal: Will not experience complications related to urinary retention ?Outcome: Progressing ?  ?Problem: Pain Managment: ?Goal: General experience of comfort will improve ?Outcome: Progressing ?  ?Problem: Skin Integrity: ?Goal: Risk for impaired skin integrity will decrease ?Outcome: Progressing ?  ?

## 2021-11-13 NOTE — Progress Notes (Signed)
? ?Progress Note  ? ?Date: 11/13/2021 ? ?HPI:Christopher [name] is here after a fall and sustained a large subdural hematoma. He had DKA on admission also and his exam showed lack of brainstem function. He underwent evacuation of SDH, trach and peg placement ? ?11/13/21: Saw pt today for staple removal.  ? ?11/06/21: No neurologic changes overnight. ? ?11/05/21: neurologically stable overnight  ? ?11/04/21: RN at bedside states pt was saying his name last night for family and localizing. He remains off of sedation today with trach in place. JP drain clearing.  ?4/30: Patient is on a ventilator with tracheostomy in place.  He is not on any sedation.  There have been reports of spontaneous movement and this is seen at bedside in his lower extremities.  The JP drain has had 50 cc of output. ? ? ?Vital Signs: ?Temp:  [98.2 ?F (36.8 ?C)-99.5 ?F (37.5 ?C)] 99.1 ?F (37.3 ?C) (05/10 0800) ?Pulse Rate:  [69-85] 78 (05/10 0800) ?Resp:  [13-29] 19 (05/10 0800) ?BP: (107-164)/(51-66) 110/53 (05/10 0800) ?SpO2:  [98 %-100 %] 100 % (05/10 0800) ?FiO2 (%):  [25 %-28 %] 28 % (05/10 0800) ?Temp (24hrs), Avg:99.1 ?F (37.3 ?C), Min:98.2 ?F (36.8 ?C), Max:99.5 ?F (37.5 ?C) ? ?Weight: 61.2 kg ? ? ?Problem List ?Patient Active Problem List  ? Diagnosis Date Noted  ? Brain damage 11/01/2021  ? ? ?Medications: ?Scheduled Meds: ? chlorhexidine gluconate (MEDLINE KIT)  15 mL Mouth Rinse BID  ? Chlorhexidine Gluconate Cloth  6 each Topical Q0600  ? free water  30 mL Per Tube Q4H  ? insulin aspart  0-9 Units Subcutaneous Q4H  ? insulin aspart  2 Units Subcutaneous Q4H  ? insulin detemir  7 Units Subcutaneous BID  ? ipratropium-albuterol  3 mL Nebulization Q6H  ? mouth rinse  15 mL Mouth Rinse 10 times per day  ? pantoprazole sodium  40 mg Per Tube Daily  ? sodium chloride flush  3 mL Intravenous Q12H  ? ?Continuous Infusions: ? sodium chloride    ? sodium chloride 10 mL/hr at 11/04/21 1900  ? feeding supplement (OSMOLITE 1.2 CAL) 1,000 mL (11/12/21 1428)   ? ?PRN Meds:.sodium chloride, acetaminophen, acetaminophen, dextrose, docusate, fentaNYL (SUBLIMAZE) injection, ondansetron (ZOFRAN) IV, polyethylene glycol, sodium chloride flush ? ?Labs: ? ?Lab Results  ?Component Value Date  ? WBC 12.5 (H) 11/11/2021  ? WBC 10.7 (H) 11/10/2021  ? HCT 32.5 (L) 11/11/2021  ? HCT 30.0 (L) 11/10/2021  ? PLT 166 11/11/2021  ? PLT 238 11/10/2021  ?  ?Lab Results  ?Component Value Date  ? INR 1.3 (H) 11/03/2021  ? APTT 42 (H) 11/03/2021  ? ? ?Lab Results  ?Component Value Date  ? NA 133 (L) 11/13/2021  ? NA 133 (L) 11/11/2021  ? K 4.3 11/13/2021  ? K 4.2 11/11/2021  ? BUN 28 (H) 11/13/2021  ? BUN 22 11/11/2021  ?  ?Lab Results  ?Component Value Date  ? MG 1.9 11/13/2021  ? ?Exam: ?Pt on trach collar ?Opens eyes spontaneously  ?Move RUE and RLE purposely  ?Incision c/d/i ? ?Imaging: ?11/03/21 ?IMPRESSION: ?New postoperative changes of right subdural hematoma evacuation and ?drain placement. Decreased midline shift and trapping of the left ?lateral ventricle. ?  ?  ?Electronically Signed ?  By: Macy Mis M.D. ?  On: 11/03/2021 14:35 ? ?Assessment/plan: ?Christopher. Christopher [name] is now status post right-sided craniotomy for subdural evacuation ? ? ?-JP removed 11/04/21 ?-Recommend continue control of blood pressure to systolic less than 350 ?-  staples removed without concerns. Ok to leave incision open to air ?- Pt to follow up with Dr. Lacinda Axon after discharge.  ? ?Loleta Dicker, PA-C ?Neurosurgery  ? ? ?

## 2021-11-14 ENCOUNTER — Encounter: Payer: Self-pay | Admitting: Dermatology

## 2021-11-14 ENCOUNTER — Ambulatory Visit (HOSPITAL_COMMUNITY)
Admission: AD | Admit: 2021-11-14 | Discharge: 2021-11-14 | Disposition: A | Discharge status: Home / Self Care | Payer: Medicare HMO | Source: Other Acute Inpatient Hospital | Attending: Internal Medicine | Admitting: Internal Medicine

## 2021-11-14 ENCOUNTER — Inpatient Hospital Stay (HOSPITAL_COMMUNITY): Admit: 2021-11-14 | Payer: Medicare HMO

## 2021-11-14 DIAGNOSIS — I6200 Nontraumatic subdural hemorrhage, unspecified: Secondary | ICD-10-CM | POA: Insufficient documentation

## 2021-11-14 DIAGNOSIS — G939 Disorder of brain, unspecified: Secondary | ICD-10-CM | POA: Diagnosis not present

## 2021-11-14 LAB — GLUCOSE, CAPILLARY
Glucose-Capillary: 220 mg/dL — ABNORMAL HIGH (ref 70–99)
Glucose-Capillary: 223 mg/dL — ABNORMAL HIGH (ref 70–99)
Glucose-Capillary: 228 mg/dL — ABNORMAL HIGH (ref 70–99)

## 2021-11-14 MED ORDER — FREE WATER: 30.0000 mL | Status: DC

## 2021-11-14 MED ORDER — ACETAMINOPHEN 650 MG RE SUPP
650.0000 mg | Freq: Four times a day (QID) | RECTAL | 0 refills | Status: AC | PRN
Start: 2021-11-14 — End: ?

## 2021-11-14 MED ORDER — AMOXICILLIN-POT CLAVULANATE 875-125 MG PO TABS: 1.0000 | ORAL_TABLET | Freq: Two times a day (BID) | ORAL | 0 refills | Status: AC

## 2021-11-14 MED ORDER — IPRATROPIUM-ALBUTEROL 0.5-2.5 (3) MG/3ML IN SOLN
3.0000 mL | Freq: Four times a day (QID) | RESPIRATORY_TRACT | Status: AC
Start: 2021-11-14 — End: ?

## 2021-11-14 MED ORDER — INSULIN ASPART 100 UNIT/ML IJ SOLN: 0.0000 [IU] | INTRAMUSCULAR | 11 refills | Status: AC

## 2021-11-14 MED ORDER — POLYETHYLENE GLYCOL 3350 17 G PO PACK: 17.0000 g | PACK | Freq: Every day | ORAL | 0 refills | Status: AC | PRN

## 2021-11-14 MED ORDER — OSMOLITE 1.2 CAL PO LIQD: 1000.0000 mL | ORAL | 0 refills | Status: DC

## 2021-11-14 MED ORDER — PANTOPRAZOLE SODIUM 40 MG PO PACK: 40.0000 mg | PACK | Freq: Every day | ORAL | Status: AC

## 2021-11-14 MED ORDER — INSULIN DETEMIR 100 UNIT/ML ~~LOC~~ SOLN
9.0000 [IU] | Freq: Two times a day (BID) | SUBCUTANEOUS | 11 refills | Status: AC
Start: 2021-11-14 — End: ?

## 2021-11-14 NOTE — TOC Progression Note (Signed)
Transition of Care (TOC) - Progression Note  ? ? ?Patient Details  ?Name: Christopher [name] ?MRN: [id number] ?Date of Birth: Nov 11, 1925 ? ?Transition of Care (TOC) CM/SW Contact  ?Truddie Hidden, RN ?Phone Number: ?11/14/2021, 10:00 AM ? ?Clinical Narrative:    ?Patient will discharge to [facility] today. [facility] transportation arranged. Attending MD and nurse notified.  ? ? ?Expected Discharge Plan: Long Term Acute Care (LTAC) ?Barriers to Discharge: Continued Medical Work up ? ?Expected Discharge Plan and Services ?Expected Discharge Plan: Long Term Acute Care (LTAC) ?  ?Discharge Planning Services: CM Consult ?Post Acute Care Choice: Long Term Acute Care (LTAC) ?Living arrangements for the past 2 months: Single Family Home ?                ?DME Arranged: N/A ?DME Agency: NA ?  ?  ?  ?HH Arranged: NA ?HH Agency: NA ?  ?  ?  ? ? ?Social Determinants of Health (SDOH) Interventions ?  ? ?Readmission Risk Interventions ?   ? View : No data to display.  ?  ?  ?  ? ? ?

## 2021-11-14 NOTE — Discharge Summary (Signed)
Christopher [name] IDP:[id number] DOB: Jul 11, 1925 DOA: 11/01/2021 ? ?PCP: Emogene Morgan, MD ? ?Admit date: 11/01/2021 ?Discharge date: 11/14/2021 ? ?Admitted From: home ?Disposition:  LTAC ? ?Recommendations for Outpatient Follow-up:  ?Follow up with PCP in 1 week ?Please obtain BMP/CBC in one week ?Please follow up neurosurgery Dr.  Adriana Simas in one week ? ? ? ?Discharge Condition:Stable ?CODE STATUS:DNR  ?Diet recommendation: NPO ? ? ?Brief/Interim Summary: ?Christopher [name] is a 88 year old male with unknown past medical history who presents to [facility] ED on 11/01/2021 due to unresponsiveness. Pt is currently intubated and unable to provide history and no family is currently available, therefore history is obtained from chart review. ?  ?EMS reported the patient had a fall off the toilet, apparently hit his head, and was unresponsive.  EMS noted the patient to be posturing with them. ?  ?Shortly after arrival to the ED he was intubated for airway protection due to GCS score of 3.  Patient remained unresponsive with fixed pupils, along with decorticate posturing noted on the right. ?  ?ED Course: ?Initial Vital Signs: Temperature 92 Fahrenheit, pulse 95, respiratory rate 18, BP 158/103, SPO2 100% ?Significant Labs: Sodium 134, bicarb 14, glucose 442, BUN 31, creatinine 1.1, anion gap 19, high-sensitivity troponin 6, lactic acid 8.6, WBC 19.4, PT 14.8, INR 1.2, beta hydroxybutyric acid 1.78 ?Urinalysis negative for UTI ?Urine drug screen negative ?Imaging ?Chest X-ray>>FINDINGS: ?Interval placement of ET tube with tip positioned approximately 2.0 ?cm from the carina. Enteric tube coursing below the diaphragm. ?Cardiac and mediastinal contours are unchanged. Mild bibasilar ?atelectasis. Trace right pleural effusion. ?CT head without contrast>>IMPRESSION: ?1. Increased size of a now large (2 cm thick) acute right cerebral ?convexity subdural hemorrhage. Significant associated mass effect ?with 1.4 cm of leftward midline shift. Recommend  urgent ?neurosurgical consultation. ?2. Rounding of the left temporal horn, compatible with ventricular ?entrapment. ?3. 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 ?Neurosurgery was consulted. Patient was admitted to ICU with Acute Metabolic Encephalopathy due to DKA and in setting of large subdural hematoma with associated mass effect with left midline shift.  Pt deemed not a surgical candidate by Neurosurgery on admission but then taken for craniotomy and tracheostomy on 4/30. PEG tube was placed on 11/11/21.His craniotomy staples were removed on 02/13/22 Patient had trach collar placed and tolerating it. Patient was transferred out of ICU , now going to [facility]. ? ? ?Acute Hypoxemic Respiratory Failure s/p tracheostomy ?In setting of intracranial bleed ?- Continue trach collar.  ?- Routine tracheostomy care per RT ?Cxr with ?aspiration->>>start Augmentin for 5 days per tube ? ?  ?Subdural hemorrhage with midline shift s/p craniotomy ?Left Hemiplegia ?- Neurosurgery following- blood pressure monitoring/control. Goal SBP <160 ?Staples removed without concerns. Ok to leave incision open to air ?Pt to follow up with Dr. Adriana Simas after discharge ?- PT/OT following with recommendations for further therapy at [facility] ?  ?DMII ?-SSI, insulin ?Check BG q4 ?  ?Nutrition ?- continue tube feeds ?  ? ? ? ?Discharge Diagnoses:  ?Principal Problem: ?  Brain damage ?Active Problems: ?  Goals of care, counseling/discussion ?  Palliative care by specialist ? ? ? ?Discharge Instructions ? ?Discharge Instructions   ? ? Diet - low sodium heart healthy   Complete by: As directed ?  ? Discharge wound care:   Complete by: As directed ?  ? As above  ? Increase activity slowly   Complete by: As directed ?  ? ?  ? ?Allergies  as of 11/14/2021   ? ?   Reactions  ? Beef-derived Products   ? Hydrochlorothiazide   ? Penicillins   ? Pork-derived Products   ? ?  ? ?  ?Medication List  ?  ? ?STOP taking  these medications   ? ?amLODipine 10 MG tablet ?Commonly known as: NORVASC ?  ?cephALEXin 500 MG capsule ?Commonly known as: KEFLEX ?  ?clopidogrel 75 MG tablet ?Commonly known as: PLAVIX ?  ?donepezil 5 MG tablet ?Commonly known as: ARICEPT ?  ?Dupixent 300 MG/2ML prefilled syringe ?Generic drug: dupilumab ?  ?Jardiance 10 MG Tabs tablet ?Generic drug: empagliflozin ?  ?metFORMIN 1000 MG tablet ?Commonly known as: GLUCOPHAGE ?  ?metoprolol succinate 100 MG 24 hr tablet ?Commonly known as: TOPROL-XL ?  ?pravastatin 40 MG tablet ?Commonly known as: PRAVACHOL ?  ?sertraline 25 MG tablet ?Commonly known as: ZOLOFT ?  ?Tradjenta 5 MG Tabs tablet ?Generic drug: linagliptin ?  ? ?  ? ?TAKE these medications   ? ?acetaminophen 650 MG suppository ?Commonly known as: TYLENOL ?Place 1 suppository (650 mg total) rectally every 6 (six) hours as needed for fever or mild pain. ?  ?amoxicillin-clavulanate 875-125 MG tablet ?Commonly known as: Augmentin ?Place 1 tablet into feeding tube every 12 (twelve) hours for 5 days. ?  ?feeding supplement (OSMOLITE 1.2 CAL) Liqd ?Place 1,000 mLs into feeding tube continuous. ?  ?free water Soln ?Place 30 mLs into feeding tube every 4 (four) hours. ?  ?insulin aspart 100 UNIT/ML injection ?Commonly known as: novoLOG ?Inject 0-9 Units into the skin every 4 (four) hours. ?  ?insulin detemir 100 UNIT/ML injection ?Commonly known as: LEVEMIR ?Inject 0.09 mLs (9 Units total) into the skin 2 (two) times daily. ?  ?ipratropium-albuterol 0.5-2.5 (3) MG/3ML Soln ?Commonly known as: DUONEB ?Take 3 mLs by nebulization every 6 (six) hours. ?  ?pantoprazole sodium 40 mg ?Commonly known as: PROTONIX ?Place 40 mg into feeding tube daily. ?  ?polyethylene glycol 17 g packet ?Commonly known as: MIRALAX / GLYCOLAX ?Place 17 g into feeding tube daily as needed for mild constipation. ?  ? ?  ? ?  ?  ? ? ?  ?Discharge Care Instructions  ?(From admission, onward)  ?  ? ? ?  ? ?  Start     Ordered  ? 11/14/21 0000   Discharge wound care:       ?Comments: As above  ? 11/14/21 1008  ? ?  ?  ? ?  ? ? Follow-up Information   ? ? Aycock, Ngwe A, MD Follow up in 1 week(s).   ?Specialty: Family Medicine ?Contact information: ?[address] ?[place] [address] ?[phone] ? ? ?  ?  ? ? Lucy Chrisook, Steven, MD Follow up in 1 week(s).   ?Specialty: Neurosurgery ?Contact information: ?[address] ?[place] [address] ?[phone] ? ? ?  ?  ? ?  ?  ? ?  ? ?Allergies  ?Allergen Reactions  ? Beef-Derived Products   ? Hydrochlorothiazide   ? Penicillins   ? Pork-Derived Products   ? ? ?Consultations: ?ENT ?PCCM ?Neurosurgery ?Neurology ? ? ?Procedures/Studies: ?DG Abd 1 View ? ?Result Date: 11/04/2021 ?CLINICAL DATA:  Nasogastric tube placement. EXAM: ABDOMEN - 1 VIEW COMPARISON:  11/01/2021. FINDINGS: Feeding tube terminates in the stomach. Mild gaseous prominence of small bowel and colon. Small right pleural effusion. IMPRESSION: Feeding tube terminates in the stomach. Electronically Signed   By: Leanna BattlesMelinda  Blietz M.D.   On: 11/04/2021 14:44  ? ?  DG Abd 1 View ? ?Result Date: 11/01/2021 ?CLINICAL DATA:  OG tube placement EXAM: ABDOMEN - 1 VIEW COMPARISON:  None. FINDINGS: Limited radiograph of the lower chest and upper abdomen was obtained for the purposes of enteric tube localization. Enteric tube is seen coursing below the diaphragm with distal tip and side port terminating within the expected location of the gastric body. IMPRESSION: Enteric tube appropriately positioned within the gastric body. Electronically Signed   By: Duanne Guess D.O.   On: 11/01/2021 19:55  ? ?CT HEAD WO CONTRAST ( ) ? ?Result Date: 11/04/2021 ?CLINICAL DATA:  Subdural hematoma EXAM: CT HEAD WITHOUT CONTRAST TECHNIQUE: Contiguous axial images were obtained from the base of the skull through the vertex without intravenous contrast. RADIATION DOSE REDUCTION: This exam was performed according to the departmental dose-optimization program which includes  automated exposure control, adjustment of the mA and/or kV according to patient size and/or use of iterative reconstruction technique. COMPARISON:  CT head November 03, 2021. FINDINGS: Brain: Mildly improve

## 2021-11-15 LAB — CULTURE, RESPIRATORY W GRAM STAIN

## 2021-11-21 DIAGNOSIS — J9621 Acute and chronic respiratory failure with hypoxia: Secondary | ICD-10-CM

## 2021-11-21 DIAGNOSIS — S065X0A Traumatic subdural hemorrhage without loss of consciousness, initial encounter: Secondary | ICD-10-CM

## 2021-11-21 DIAGNOSIS — Z930 Tracheostomy status: Secondary | ICD-10-CM

## 2021-11-21 DIAGNOSIS — J690 Pneumonitis due to inhalation of food and vomit: Secondary | ICD-10-CM

## 2021-11-22 DIAGNOSIS — S065X0A Traumatic subdural hemorrhage without loss of consciousness, initial encounter: Secondary | ICD-10-CM

## 2021-11-22 DIAGNOSIS — Z930 Tracheostomy status: Secondary | ICD-10-CM

## 2021-11-22 DIAGNOSIS — J9621 Acute and chronic respiratory failure with hypoxia: Secondary | ICD-10-CM

## 2021-11-22 DIAGNOSIS — J690 Pneumonitis due to inhalation of food and vomit: Secondary | ICD-10-CM

## 2021-11-23 DIAGNOSIS — Z930 Tracheostomy status: Secondary | ICD-10-CM

## 2021-11-23 DIAGNOSIS — J9621 Acute and chronic respiratory failure with hypoxia: Secondary | ICD-10-CM

## 2021-11-23 DIAGNOSIS — J690 Pneumonitis due to inhalation of food and vomit: Secondary | ICD-10-CM

## 2021-11-23 DIAGNOSIS — S065X0A Traumatic subdural hemorrhage without loss of consciousness, initial encounter: Secondary | ICD-10-CM

## 2021-11-24 DIAGNOSIS — J9621 Acute and chronic respiratory failure with hypoxia: Secondary | ICD-10-CM

## 2021-11-24 DIAGNOSIS — J690 Pneumonitis due to inhalation of food and vomit: Secondary | ICD-10-CM

## 2021-11-24 DIAGNOSIS — S065X0A Traumatic subdural hemorrhage without loss of consciousness, initial encounter: Secondary | ICD-10-CM

## 2021-11-24 DIAGNOSIS — Z930 Tracheostomy status: Secondary | ICD-10-CM

## 2021-11-25 DIAGNOSIS — Z930 Tracheostomy status: Secondary | ICD-10-CM

## 2021-11-25 DIAGNOSIS — J9621 Acute and chronic respiratory failure with hypoxia: Secondary | ICD-10-CM

## 2021-11-25 DIAGNOSIS — S065X0A Traumatic subdural hemorrhage without loss of consciousness, initial encounter: Secondary | ICD-10-CM

## 2021-11-25 DIAGNOSIS — J690 Pneumonitis due to inhalation of food and vomit: Secondary | ICD-10-CM

## 2021-11-26 DIAGNOSIS — J690 Pneumonitis due to inhalation of food and vomit: Secondary | ICD-10-CM

## 2021-11-26 DIAGNOSIS — Z930 Tracheostomy status: Secondary | ICD-10-CM

## 2021-11-26 DIAGNOSIS — S065X0A Traumatic subdural hemorrhage without loss of consciousness, initial encounter: Secondary | ICD-10-CM

## 2021-11-26 DIAGNOSIS — J9621 Acute and chronic respiratory failure with hypoxia: Secondary | ICD-10-CM

## 2021-11-27 DIAGNOSIS — Z930 Tracheostomy status: Secondary | ICD-10-CM

## 2021-11-27 DIAGNOSIS — J9621 Acute and chronic respiratory failure with hypoxia: Secondary | ICD-10-CM

## 2021-11-27 DIAGNOSIS — S065X0A Traumatic subdural hemorrhage without loss of consciousness, initial encounter: Secondary | ICD-10-CM

## 2021-11-27 DIAGNOSIS — J690 Pneumonitis due to inhalation of food and vomit: Secondary | ICD-10-CM

## 2021-12-03 ENCOUNTER — Encounter: Payer: Self-pay | Admitting: Neurosurgery

## 2022-01-01 ENCOUNTER — Emergency Department (HOSPITAL_COMMUNITY)
Admission: EM | Admit: 2022-01-01 | Discharge: 2022-01-01 | Disposition: A | Discharge status: Other Acute Inpatient Hospital | Payer: Medicare Other | Attending: Emergency Medicine | Admitting: Emergency Medicine

## 2022-01-01 ENCOUNTER — Encounter (HOSPITAL_COMMUNITY): Payer: Self-pay | Admitting: Emergency Medicine

## 2022-01-01 ENCOUNTER — Other Ambulatory Visit: Payer: Self-pay

## 2022-01-01 DIAGNOSIS — Z7984 Long term (current) use of oral hypoglycemic drugs: Secondary | ICD-10-CM | POA: Insufficient documentation

## 2022-01-01 DIAGNOSIS — Z794 Long term (current) use of insulin: Secondary | ICD-10-CM | POA: Insufficient documentation

## 2022-01-01 DIAGNOSIS — R739 Hyperglycemia, unspecified: Secondary | ICD-10-CM | POA: Insufficient documentation

## 2022-01-01 DIAGNOSIS — R9431 Abnormal electrocardiogram [ECG] [EKG]: Secondary | ICD-10-CM | POA: Insufficient documentation

## 2022-01-01 LAB — CBC WITH DIFFERENTIAL/PLATELET
Abs Immature Granulocytes: 0.02 10*3/uL (ref 0.00–0.07)
Basophils Absolute: 0 10*3/uL (ref 0.0–0.1)
Basophils Relative: 0 %
Eosinophils Absolute: 0.2 10*3/uL (ref 0.0–0.5)
Eosinophils Relative: 2 %
HCT: 43.9 % (ref 39.0–52.0)
Hemoglobin: 14.3 g/dL (ref 13.0–17.0)
Immature Granulocytes: 0 %
Lymphocytes Relative: 18 %
Lymphs Abs: 1.4 10*3/uL (ref 0.7–4.0)
MCH: 26.9 pg (ref 26.0–34.0)
MCHC: 32.6 g/dL (ref 30.0–36.0)
MCV: 82.5 fL (ref 80.0–100.0)
Monocytes Absolute: 1 10*3/uL (ref 0.1–1.0)
Monocytes Relative: 13 %
Neutro Abs: 5.1 10*3/uL (ref 1.7–7.7)
Neutrophils Relative %: 67 %
Platelets: 258 10*3/uL (ref 150–400)
RBC: 5.32 MIL/uL (ref 4.22–5.81)
RDW: 14.3 % (ref 11.5–15.5)
WBC: 7.7 10*3/uL (ref 4.0–10.5)
nRBC: 0 % (ref 0.0–0.2)

## 2022-01-01 LAB — MAGNESIUM: Magnesium: 2 mg/dL (ref 1.7–2.4)

## 2022-01-01 LAB — COMPREHENSIVE METABOLIC PANEL
ALT: 27 U/L (ref 0–44)
AST: 30 U/L (ref 15–41)
Albumin: 3.1 g/dL — ABNORMAL LOW (ref 3.5–5.0)
Alkaline Phosphatase: 163 U/L — ABNORMAL HIGH (ref 38–126)
Anion gap: 12 (ref 5–15)
BUN: 22 mg/dL (ref 8–23)
CO2: 25 mmol/L (ref 22–32)
Calcium: 9.5 mg/dL (ref 8.9–10.3)
Chloride: 99 mmol/L (ref 98–111)
Creatinine, Ser: 0.77 mg/dL (ref 0.61–1.24)
GFR, Estimated: >60 mL/min (ref >60)
Glucose, Bld: 208 mg/dL — ABNORMAL HIGH (ref 70–99)
Potassium: 4.3 mmol/L (ref 3.5–5.1)
Sodium: 136 mmol/L (ref 135–145)
Total Bilirubin: 0.6 mg/dL (ref 0.3–1.2)
Total Protein: 8.1 g/dL (ref 6.5–8.1)

## 2022-01-01 NOTE — Discharge Instructions (Signed)
Today's evaluation has been reassuring.  Please be sure to follow-up with your physician as needed.  Return here for concerning changes in your condition.

## 2022-01-01 NOTE — ED Notes (Signed)
[facility] WAS CALLED NO ETA 

## 2022-01-01 NOTE — ED Triage Notes (Signed)
Pt BIB [facility] from [facility]. Called out for EKG changes, [facility] staff reporting afib with blockage, EKG shows significant artifact. EMS 12 lead shows NSR with PACs. GCS 7 at baseline. CBG 272, 124/52, 98% RA, lungs clear, HR 94.

## 2022-01-01 NOTE — ED Notes (Signed)
Report called to Dinett RN at [facility] for pt's return via [facility].

## 2022-01-01 NOTE — ED Notes (Signed)
[facility] WAS CALLED NO ETA

## 2022-01-01 NOTE — ED Provider Notes (Signed)
[facility] EMERGENCY DEPARTMENT Provider Note   CSN: [id number] Arrival date & time: 01/01/22  1349     History  Chief Complaint  Patient presents with   EKG changes    Christopher [name] is a 88 y.o. male.  HPI Patient presents from long-term acute care facility with staff concern of possible arrhythmia.  The patient cannot provide any details, is essentially nonverbal level 5 caveat.  History is obtained by staff, paperwork, chart review.  Patient had a fall earlier this year, sustained subdural hematoma requiring craniotomy, has PEG tube, tracheostomy in place.  Per staff at the facility has been in his usual state of health, with no notable changes.  Today, the patient had an ECG for unclear reasons, and staff felt it was concerning, sent him here for evaluation.  Staff was unable to contact the patient's son prior to transport.    Home Medications Prior to Admission medications   Medication Sig Start Date End Date Taking? Authorizing Provider  acetaminophen (TYLENOL) 650 MG suppository Place 1 suppository (650 mg total) rectally every 6 (six) hours as needed for fever or mild pain. 11/14/21  Yes Lynn Ito, MD  guaiFENesin 200 MG tablet Place 200 mg into feeding tube every 8 (eight) hours.   Yes [provider]  insulin glargine (SEMGLEE, YFGN,) 100 UNIT/ML Solostar Pen Inject 35 Units into the skin daily.   Yes [provider]  insulin glargine-yfgn (SEMGLEE) 100 UNIT/ML Pen Inject 35 Units into the skin at bedtime.   Yes [provider]  lansoprazole (PREVACID SOLUTAB) 30 MG disintegrating tablet Take 30 mg by mouth daily.   Yes [provider]  melatonin 3 MG TABS tablet Place 3 mg into feeding tube at bedtime.   Yes [provider]  Nutritional Supplements (FEEDING SUPPLEMENT, OSMOLITE 1.5 CAL,) LIQD Place 1,000 mLs into feeding tube continuous.   Yes [provider]  oxyCODONE (OXY IR/ROXICODONE) 5 MG  immediate release tablet Place 5 mg into feeding tube every 6 (six) hours as needed (pain).   Yes [provider]  polyethylene glycol (MIRALAX / GLYCOLAX) 17 g packet Place 17 g into feeding tube daily as needed for mild constipation. Patient taking differently: Take 17 g by mouth daily. 11/14/21  Yes Lynn Ito, MD  amLODipine (NORVASC) 10 MG tablet Take 1 tablet (10 mg total) by mouth daily. 01/01/17   Adrian Saran, MD  donepezil (ARICEPT) 5 MG tablet Take 5 mg by mouth daily. 08/13/16   [provider]  dupilumab (DUPIXENT) 300 MG/2ML prefilled syringe Inject 300 mg into the skin every 14 (fourteen) days. Starting at day 15 for maintenance. 05/06/21   Deirdre Evener, MD  insulin aspart (NOVOLOG) 100 UNIT/ML injection Inject 0-9 Units into the skin every 4 (four) hours. 11/14/21   Lynn Ito, MD  insulin detemir (LEVEMIR) 100 UNIT/ML injection Inject 0.09 mLs (9 Units total) into the skin 2 (two) times daily. 11/14/21   Lynn Ito, MD  ipratropium-albuterol (DUONEB) 0.5-2.5 (3) MG/3ML SOLN Take 3 mLs by nebulization every 6 (six) hours. 11/14/21   Lynn Ito, MD  metFORMIN (GLUCOPHAGE) 1000 MG tablet Take 1,000 mg by mouth 2 (two) times daily. 10/06/21   [provider]  metoprolol succinate (TOPROL-XL) 100 MG 24 hr tablet Take 1 tablet (100 mg total) by mouth daily. Take with or immediately following a meal. 01/01/17   Adrian Saran, MD  Multiple Vitamin (MULTI-VITAMINS) TABS Take 1 tablet by mouth daily.    [provider]  pantoprazole sodium (PROTONIX) 40 mg Place 40 mg into feeding tube daily. 11/14/21   Nolberto Hanlon, MD  pravastatin (PRAVACHOL) 40 MG tablet Take 1 tablet (40 mg total) by mouth daily. 07/05/15   Epifanio Lesches, MD  sertraline (ZOLOFT) 25 MG tablet Take 25 mg by mouth at bedtime. 10/06/21   [provider]  TRADJENTA 5 MG TABS tablet Take 5 mg by mouth daily. 10/06/21   [provider]  triamcinolone cream (KENALOG) 0.1 %  Apply 1 application topically 2 (two) times daily as needed. 11/05/20   Ralene Bathe, MD  Water For Irrigation, Sterile (FREE WATER) SOLN Place 30 mLs into feeding tube every 4 (four) hours. 11/14/21   Nolberto Hanlon, MD      Allergies    Hydrochlorothiazide, Beef-derived products, Hydrochlorothiazide, Penicillins, and Pork-derived products    Review of Systems   Review of Systems  Unable to perform ROS: Patient nonverbal    Physical Exam Updated Vital Signs BP 139/78   Pulse 93   Temp 98.4 F (36.9 C) (Axillary)   Resp (!) 21   SpO2 99%  Physical Exam Vitals and nursing note reviewed.  Constitutional:      General: He is not in acute distress.    Appearance: He is well-developed.     Comments: Chronically ill cachectic elderly male in no distress, seemingly following activity in the room, essentially nonverbal  HENT:     Head: Normocephalic and atraumatic.  Eyes:     Conjunctiva/sclera: Conjunctivae normal.  Neck:     Comments: Tracheostomy without surrounding erythema drainage, bleeding Cardiovascular:     Rate and Rhythm: Normal rate and regular rhythm.  Pulmonary:     Effort: Pulmonary effort is normal. No respiratory distress.     Breath sounds: No stridor.  Abdominal:     General: There is no distension.     Tenderness: There is no abdominal tenderness. There is no guarding.  Skin:    General: Skin is warm and dry.  Neurological:     Mental Status: He is alert and oriented to person, place, and time.     Comments: Left-sided hemiplegia, facial asymmetry, patient seemingly attempted to phonate several times, with non discernible speech.  Psychiatric:        Cognition and Memory: Cognition is impaired. Memory is impaired.        ED Results / Procedures / Treatments   Labs (all labs ordered are listed, but only abnormal results are displayed) Labs Reviewed  COMPREHENSIVE METABOLIC PANEL - Abnormal; Notable for the following components:      Result Value    Glucose, Bld 208 (*)    Albumin 3.1 (*)    Alkaline Phosphatase 163 (*)    All other components within normal limits  CBC WITH DIFFERENTIAL/PLATELET  MAGNESIUM    EKG EKG Interpretation  Date/Time:  Wednesday January 01 2022 14:04:25 EDT Ventricular Rate:  88 PR Interval:  170 QRS Duration: 87 QT Interval:  361 QTC Calculation: 437 R Axis:   256 Text Interpretation: Right and left arm electrode reversal, interpretation assumes no reversal Sinus rhythm Multiple premature complexes, vent & supraven Inferior infarct, old Abnormal lateral Q waves No significant change since last tracing Abnormal ECG Confirmed by Carmin Muskrat [phone]) on 01/01/2022 2:11:27 PM  Radiology No results found.  Procedures Procedures    Medications Ordered in ED Medications - No data to display  ED Course/ Medical Decision Making/ A&P This patient with a Hx of  multiple medical issues including baseline impaired cognition, prior subdural hematoma, Hemi plegia, tracheostomy and PEG tube presents to the ED for concern of possible abnormal EKG, this involves an extensive number of treatment options, and is a complaint that carries with it a high risk of complications and morbidity.    The differential diagnosis includes arrhythmia, electrolyte abnormalities, dehydration, less likely ACS   Social Determinants of Health:  Nonverbal nursing home resident advanced age  Additional history obtained:  Additional history and/or information obtained from nursing home staff, and chart review as above, notable for details included above, and I obtained and reviewed the ECG from the nursing facility and rhythm strip from EMS.  Media included above   After the initial evaluation, orders, including: ECG, labs were initiated.   Patient placed on Cardiac and Pulse-Oximetry Monitors. The patient was maintained on a cardiac monitor.  The cardiac monitored showed an rhythm of sinus rhythm, 85 unremarkable The patient  was also maintained on pulse oximetry. The readings were typically 96% via trach abnormal   On repeat evaluation of the patient stayed the same  Lab Tests:  I personally interpreted labs.  The pertinent results include: Mild hyperglycemia otherwise unremarkable, no substantial electrolyte abnormalities, normal magnesium   Dispostion / Final MDM:  After consideration of the diagnostic results and the patient's response to treatment, patient is appropriate to return to his long-term acute care facility.  On reviewing the patient's studies from that facility from EMS and from here patient has evidence for sinus rhythm throughout, no evidence for ischemia or sustained arrhythmia.  Electrolytes are reassuring, no leukocytosis, no fever, no evidence for acute new pathology such as bacteremia, sepsis.  Final Clinical Impression(s) / ED Diagnoses Final diagnoses:  Abnormal ECG    Rx / DC Orders ED Discharge Orders     None         Gerhard Munch, MD 01/01/22 1501

## 2022-01-15 ENCOUNTER — Emergency Department (HOSPITAL_COMMUNITY): Payer: Medicare Other

## 2022-01-15 ENCOUNTER — Other Ambulatory Visit: Payer: Self-pay

## 2022-01-15 ENCOUNTER — Emergency Department (HOSPITAL_COMMUNITY)
Admission: EM | Admit: 2022-01-15 | Discharge: 2022-01-15 | Disposition: A | Discharge status: Home / Self Care | Payer: Medicare Other | Attending: Emergency Medicine | Admitting: Emergency Medicine

## 2022-01-15 ENCOUNTER — Encounter (HOSPITAL_COMMUNITY): Payer: Self-pay

## 2022-01-15 DIAGNOSIS — Z8673 Personal history of transient ischemic attack (TIA), and cerebral infarction without residual deficits: Secondary | ICD-10-CM | POA: Diagnosis not present

## 2022-01-15 DIAGNOSIS — Z431 Encounter for attention to gastrostomy: Secondary | ICD-10-CM | POA: Diagnosis not present

## 2022-01-15 DIAGNOSIS — I10 Essential (primary) hypertension: Secondary | ICD-10-CM | POA: Insufficient documentation

## 2022-01-15 DIAGNOSIS — E119 Type 2 diabetes mellitus without complications: Secondary | ICD-10-CM | POA: Insufficient documentation

## 2022-01-15 HISTORY — PX: IR REPLACE G-TUBE SIMPLE WO FLUORO: IMG2323

## 2022-01-15 MED ORDER — DIATRIZOATE MEGLUMINE & SODIUM 66-10 % PO SOLN
ORAL | Status: AC
  Administered 2022-01-15: 30 mL via GASTROSTOMY
  Filled 2022-01-15: qty 30

## 2022-01-15 NOTE — Discharge Instructions (Signed)
Your G-tube was successfully replaced by interventional radiology.  X-ray confirmed placement with contrast.  Please continue your outpatient management and follow-up with your primary doctor.

## 2022-01-15 NOTE — ED Notes (Signed)
83F Foley placed in G-tube track. Waiting on supplies.

## 2022-01-15 NOTE — ED Provider Notes (Signed)
Care assumed from Dr. Pilar Plate.  At time of transfer of care, we are waiting on the PEG tube replacement to be sent down from upstairs.  He placed a Foley catheter to hold it open.  Plan of care will be to replace at the bedside and then check patency with an x-ray with contrast.  If it is placed appropriately, plan of care be to discharge back to facility.  There was difficulty getting the PEG tube sent down from upstairs.  We were told to place consult for IR to eval and replace it.  We will do this.  Once tube is replaced and confirmed, anticipate discharge home.  1:36 PM Tube has been replaced and x-ray confirms placement.  We will discharge patient per previous plan.  Clinical Impression: 1. Attention to G-tube [facility])     Disposition: Discharge  Condition: Good  I have discussed the results, Dx and Tx plan with the pt(& family if present). He/she/they expressed understanding and agree(s) with the plan. Discharge instructions discussed at great length. Strict return precautions discussed and pt &/or family have verbalized understanding of the instructions. No further questions at time of discharge.    New Prescriptions   No medications on file    Follow Up: Jackie Plum, MD [address] [place] [address] [phone]           Ajax Schroll, Canary Brim, MD 01/15/22 [phone]

## 2022-01-15 NOTE — ED Triage Notes (Signed)
Arrives EMS from [facility] after G-Tube got pulled out. No tube present. Previously documented a 46F.   Cbg 66

## 2022-01-15 NOTE — ED Provider Notes (Signed)
[facility] Emergency Department Provider Note MRN:  [id number]  Arrival date & time: 01/15/22     Chief Complaint   G-tube dislodged History of Present Illness   Christopher [name] is a 88 y.o. year-old male with a history of hypertension, diabetes, stroke presenting to the ED with chief complaint of G-tube dislodged.  G-tube fell out, sent here from facility.  Review of Systems  I was unable to obtain a full/accurate HPI, PMH, or ROS due to the patient's cognitive impairment.  Patient's Health History    Past Medical History:  Diagnosis Date   Arthritis    Ataxia    Diabetes mellitus without complication (HCC)    controlled, pt checks it in the morning    Dyslipidemia    Hypertension    somewhat controlled, pt checks every morning, he reports it has been good.   Stroke [facility])    TIA (transient ischemic attack)     Past Surgical History:  Procedure Laterality Date   CRANIOTOMY Right 11/02/2021   Procedure: CRANIOTOMY HEMATOMA EVACUATION SUBDURAL;  Surgeon: Lucy Chris, MD;  Location: [facility] ORS;  Service: Neurosurgery;  Laterality: Right;   IR GASTROSTOMY TUBE MOD SED  11/11/2021   none     TRACHEOSTOMY TUBE PLACEMENT  11/02/2021   Procedure: TRACHEOSTOMY;  Surgeon: Lucy Chris, MD;  Location: [facility] ORS;  Service: Neurosurgery;;    Family History  Problem Relation Age of Onset   CAD Neg Hx    Hypertension Neg Hx     Social History   Socioeconomic History   Marital status: Married    Spouse name: Not on file   Number of children: Not on file   Years of education: Not on file   Highest education level: Not on file  Occupational History   Not on file  Tobacco Use   Smoking status: Never   Smokeless tobacco: Never   Tobacco comments:    quit 50-60 years ago  Vaping Use   Vaping Use: Never used  Substance and Sexual Activity   Alcohol use: No   Drug use: No   Sexual activity: Not on file  Other Topics Concern   Not on file  Social History  Narrative   ** Merged History Encounter **       Social Determinants of Health   Financial Resource Strain: Not on file  Food Insecurity: Not on file  Transportation Needs: Not on file  Physical Activity: Not on file  Stress: Not on file  Social Connections: Not on file  Intimate Partner Violence: Not on file     Physical Exam   Vitals:   01/15/22 0353 01/15/22 0651  BP: 115/83 112/79  Pulse: 100 95  Resp: 18 16  Temp: 97.9 F (36.6 C)   SpO2: 100% 99%    CONSTITUTIONAL: Chronically ill-appearing, NAD NEURO/PSYCH: Nonverbal, confused EYES:  eyes equal and reactive ENT/NECK:  no LAD, no JVD CARDIO: Regular rate, well-perfused, normal S1 and S2 PULM:  CTAB no wheezing or rhonchi GI/GU:  non-distended, non-tender MSK/SPINE:  No gross deformities, no edema SKIN:  no rash, atraumatic   *Additional and/or pertinent findings included in MDM below  Diagnostic and Interventional Summary    EKG Interpretation  Date/Time:    Ventricular Rate:    PR Interval:    QRS Duration:   QT Interval:    QTC Calculation:   R Axis:     Text Interpretation:         Labs Reviewed -  No data to display  No orders to display    Medications - No data to display   Procedures  /  Critical Care Gastrostomy tube replacement  Date/Time: 01/15/2022 7:04 AM  Performed by: Sabas Sous, MD Authorized by: Sabas Sous, MD  Consent: The procedure was performed in an emergent situation. Patient identity confirmed: arm band Time out: Immediately prior to procedure a "time out" was called to verify the correct patient, procedure, equipment, support staff and site/side marked as required. Preparation: Patient was prepped and draped in the usual sterile fashion. Local anesthesia used: no  Anesthesia: Local anesthesia used: no  Sedation: Patient sedated: no  Patient tolerance: patient tolerated the procedure well with no immediate complications Comments: Foley catheter, 20  French, placed in the G-tube stoma to keep it patent.     ED Course and Medical Decision Making  Initial Impression and Ddx G-tube dislodgment, history limited due to patient's cognitive impairment, unable to reach family.  Still awaiting 20 French G-tube to arrive from endoscopy, in the interim a 20 French Foley catheter was placed in the stoma.  Would obtain x-ray with contrast to ensure proper placement, signed out to oncoming provider.  Past medical/surgical history that increases complexity of ED encounter: Brain damage  Interpretation of Diagnostics Not applicable  Patient Reassessment and Ultimate Disposition/Management     Signed out to oncoming provider  Patient management required discussion with the following services or consulting groups:  None  Complexity of Problems Addressed Acute illness or injury that poses threat of life of bodily function  Additional Data Reviewed and Analyzed Further history obtained from: EMS on arrival  Additional Factors Impacting ED Encounter Risk Minor Procedures  [name] Pilar Plate, MD [facility] Emergency Medicine [facility] [email or web address]  Final Clinical Impressions(s) / ED Diagnoses     ICD-10-CM   1. Attention to G-tube [facility])  Z43.1       ED Discharge Orders     None        Discharge Instructions Discussed with and Provided to Patient:   Discharge Instructions   None      Sabas Sous, MD 01/15/22 [phone]

## 2022-01-15 NOTE — Procedures (Signed)
Pre procedural Dx: Dysphagia, dislodged G-tune Post procedural Dx: Same  Successful fluoroscopic guided replacement of 20 Fr gastrostomy tube within the existing tract.  KUB pending to confirm location within gastric lumen, once location confirmed may use for feeds/meds/free water/venting.    EBL: None  Complications: None immediate.  Lynnette Caffey, PA-C

## 2022-01-15 NOTE — ED Notes (Signed)
PTAR called at 13:47; patient is 4th in line.

## 2022-04-08 ENCOUNTER — Emergency Department: Payer: Medicare Other

## 2022-04-08 ENCOUNTER — Emergency Department
Admission: EM | Admit: 2022-04-08 | Discharge: 2022-04-08 | Disposition: A | Discharge status: Home / Self Care | Payer: Medicare Other | Source: Home / Self Care | Attending: Emergency Medicine | Admitting: Emergency Medicine

## 2022-04-08 ENCOUNTER — Other Ambulatory Visit: Payer: Self-pay

## 2022-04-08 ENCOUNTER — Encounter: Payer: Self-pay | Admitting: Emergency Medicine

## 2022-04-08 DIAGNOSIS — E119 Type 2 diabetes mellitus without complications: Secondary | ICD-10-CM | POA: Insufficient documentation

## 2022-04-08 DIAGNOSIS — I10 Essential (primary) hypertension: Secondary | ICD-10-CM | POA: Insufficient documentation

## 2022-04-08 DIAGNOSIS — A4189 Other specified sepsis: Secondary | ICD-10-CM | POA: Diagnosis not present

## 2022-04-08 DIAGNOSIS — K9423 Gastrostomy malfunction: Secondary | ICD-10-CM

## 2022-04-08 DIAGNOSIS — R4182 Altered mental status, unspecified: Secondary | ICD-10-CM | POA: Diagnosis not present

## 2022-04-08 DIAGNOSIS — Z8673 Personal history of transient ischemic attack (TIA), and cerebral infarction without residual deficits: Secondary | ICD-10-CM | POA: Insufficient documentation

## 2022-04-08 MED ORDER — DIATRIZOATE MEGLUMINE & SODIUM 66-10 % PO SOLN
30.0000 mL | Freq: Once | ORAL | Status: AC
  Administered 2022-04-08: 20 mL

## 2022-04-08 NOTE — ED Triage Notes (Signed)
Presents via EMS from [facility]  Per EMS the staff thinks he pulled out his g-tube

## 2022-04-08 NOTE — ED Provider Notes (Signed)
   [facility] Provider Note    Event Date/Time   First MD Initiated Contact with Patient 04/08/22 1103     (approximate)  History   Chief Complaint: Displaced G-tube  HPI  Christopher [name] is a 88 y.o. male with a past medical history of diabetes, hypertension, CVA, largely nonverbal presents to the emergency department for displaced G-tube.  According to the facility the patient's G-tube fell out they tried to replace it and did an x-ray but the x-ray cannot confirm placement so they sent the patient to the emergency department.  Here the patient is somnolent will awaken to voice but does not answer questions or follow commands.  Physical Exam   Triage Vital Signs: ED Triage Vitals  Enc Vitals Group     BP 04/08/22 1102 99/62     Pulse Rate 04/08/22 1102 (!) 104     Resp 04/08/22 1102 16     Temp 04/08/22 1102 98.2 F (36.8 C)     Temp Source 04/08/22 1102 Oral     SpO2 04/08/22 1102 96 %     Weight 04/08/22 1105 127 lb 10.3 oz (57.9 kg)     Height 04/08/22 1105 5\' 2"  (1.575 m)     Head Circumference --      Peak Flow --      Pain Score --      Pain Loc --      Pain Edu? --      Excl. in [place]? --     Most recent vital signs: Vitals:   04/08/22 1102  BP: 99/62  Pulse: (!) 104  Resp: 16  Temp: 98.2 F (36.8 C)  SpO2: 96%    General: Awake, no distress.  CV:  Good peripheral perfusion.  Regular rate and rhythm  Resp:  Normal effort.  Equal breath sounds bilaterally.  Abd:  No distention.  Soft, nontender.  No reaction to palpation.  G-tube present.   ED Results / Procedures / Treatments   RADIOLOGY  I have viewed and interpreted the x-ray images.  Patient appears to have a well-positioned G-tube. Radiology is read the x-ray is satisfactory position of the G-tube.   MEDICATIONS ORDERED IN ED: Medications - No data to display   IMPRESSION / MDM / [place] / ED COURSE  I reviewed the triage vital signs and the nursing  notes.  Patient's presentation is most consistent with acute, uncomplicated illness.  Patient presents emergency department for displaced G-tube which has been replaced however they were not sure if it is in the appropriate position.  Patient has a benign abdomen.  We have removed the G-tube and replaced it with a 22 [place] G-tube without issue.  We will obtain a Gastrografin DG abdomen to confirm placement.  Abdomen is benign, no other reported issues.  G-tube appears to be in the appropriate place.  We will discharge patient back to his nursing facility.  FINAL CLINICAL IMPRESSION(S) / ED DIAGNOSES   Displaced G-tube   Note:  This document was prepared using Dragon voice recognition software and may include unintentional dictation errors.   Harvest Dark, MD 04/08/22 1220

## 2022-04-08 NOTE — ED Notes (Signed)
Pt was cleaned and changed prior to transport with EMS.

## 2022-04-11 ENCOUNTER — Inpatient Hospital Stay: Payer: Medicare Other

## 2022-04-11 ENCOUNTER — Inpatient Hospital Stay
Admission: EM | Admit: 2022-04-11 | Discharge: 2022-05-07 | DRG: 871 | Disposition: E | Payer: Medicare Other | Source: Skilled Nursing Facility | Attending: Internal Medicine | Admitting: Internal Medicine

## 2022-04-11 ENCOUNTER — Emergency Department: Payer: Medicare Other

## 2022-04-11 DIAGNOSIS — J15212 Pneumonia due to Methicillin resistant Staphylococcus aureus: Secondary | ICD-10-CM | POA: Diagnosis present

## 2022-04-11 DIAGNOSIS — R4182 Altered mental status, unspecified: Secondary | ICD-10-CM | POA: Diagnosis present

## 2022-04-11 DIAGNOSIS — Z431 Encounter for attention to gastrostomy: Secondary | ICD-10-CM

## 2022-04-11 DIAGNOSIS — I5023 Acute on chronic systolic (congestive) heart failure: Secondary | ICD-10-CM | POA: Diagnosis not present

## 2022-04-11 DIAGNOSIS — A419 Sepsis, unspecified organism: Secondary | ICD-10-CM | POA: Diagnosis not present

## 2022-04-11 DIAGNOSIS — Z930 Tracheostomy status: Secondary | ICD-10-CM | POA: Diagnosis not present

## 2022-04-11 DIAGNOSIS — Z66 Do not resuscitate: Secondary | ICD-10-CM | POA: Diagnosis present

## 2022-04-11 DIAGNOSIS — E860 Dehydration: Secondary | ICD-10-CM | POA: Diagnosis present

## 2022-04-11 DIAGNOSIS — J9601 Acute respiratory failure with hypoxia: Secondary | ICD-10-CM | POA: Diagnosis present

## 2022-04-11 DIAGNOSIS — E878 Other disorders of electrolyte and fluid balance, not elsewhere classified: Secondary | ICD-10-CM | POA: Diagnosis present

## 2022-04-11 DIAGNOSIS — R0603 Acute respiratory distress: Secondary | ICD-10-CM | POA: Diagnosis not present

## 2022-04-11 DIAGNOSIS — Z79899 Other long term (current) drug therapy: Secondary | ICD-10-CM

## 2022-04-11 DIAGNOSIS — E1100 Type 2 diabetes mellitus with hyperosmolarity without nonketotic hyperglycemic-hyperosmolar coma (NKHHC): Secondary | ICD-10-CM | POA: Diagnosis not present

## 2022-04-11 DIAGNOSIS — E785 Hyperlipidemia, unspecified: Secondary | ICD-10-CM | POA: Diagnosis present

## 2022-04-11 DIAGNOSIS — Z794 Long term (current) use of insulin: Secondary | ICD-10-CM | POA: Diagnosis not present

## 2022-04-11 DIAGNOSIS — Z91014 Allergy to mammalian meats: Secondary | ICD-10-CM

## 2022-04-11 DIAGNOSIS — R6521 Severe sepsis with septic shock: Secondary | ICD-10-CM | POA: Diagnosis present

## 2022-04-11 DIAGNOSIS — U071 COVID-19: Secondary | ICD-10-CM | POA: Diagnosis present

## 2022-04-11 DIAGNOSIS — R400 Somnolence: Secondary | ICD-10-CM

## 2022-04-11 DIAGNOSIS — R571 Hypovolemic shock: Secondary | ICD-10-CM | POA: Diagnosis present

## 2022-04-11 DIAGNOSIS — Z880 Allergy status to penicillin: Secondary | ICD-10-CM

## 2022-04-11 DIAGNOSIS — R270 Ataxia, unspecified: Secondary | ICD-10-CM | POA: Diagnosis present

## 2022-04-11 DIAGNOSIS — E861 Hypovolemia: Secondary | ICD-10-CM | POA: Diagnosis present

## 2022-04-11 DIAGNOSIS — J1282 Pneumonia due to coronavirus disease 2019: Secondary | ICD-10-CM | POA: Diagnosis present

## 2022-04-11 DIAGNOSIS — G9341 Metabolic encephalopathy: Secondary | ICD-10-CM | POA: Diagnosis present

## 2022-04-11 DIAGNOSIS — Z888 Allergy status to other drugs, medicaments and biological substances status: Secondary | ICD-10-CM

## 2022-04-11 DIAGNOSIS — Z8673 Personal history of transient ischemic attack (TIA), and cerebral infarction without residual deficits: Secondary | ICD-10-CM | POA: Diagnosis not present

## 2022-04-11 DIAGNOSIS — A4189 Other specified sepsis: Principal | ICD-10-CM | POA: Diagnosis present

## 2022-04-11 DIAGNOSIS — I214 Non-ST elevation (NSTEMI) myocardial infarction: Secondary | ICD-10-CM | POA: Diagnosis not present

## 2022-04-11 DIAGNOSIS — Z7984 Long term (current) use of oral hypoglycemic drugs: Secondary | ICD-10-CM

## 2022-04-11 DIAGNOSIS — E1165 Type 2 diabetes mellitus with hyperglycemia: Secondary | ICD-10-CM | POA: Diagnosis present

## 2022-04-11 DIAGNOSIS — N179 Acute kidney failure, unspecified: Secondary | ICD-10-CM | POA: Diagnosis present

## 2022-04-11 DIAGNOSIS — R7989 Other specified abnormal findings of blood chemistry: Secondary | ICD-10-CM | POA: Diagnosis present

## 2022-04-11 DIAGNOSIS — I110 Hypertensive heart disease with heart failure: Secondary | ICD-10-CM | POA: Diagnosis present

## 2022-04-11 DIAGNOSIS — M1990 Unspecified osteoarthritis, unspecified site: Secondary | ICD-10-CM | POA: Diagnosis present

## 2022-04-11 DIAGNOSIS — E870 Hyperosmolality and hypernatremia: Secondary | ICD-10-CM | POA: Diagnosis present

## 2022-04-11 LAB — D-DIMER, QUANTITATIVE: D-Dimer, Quant: 2.23 ug/mL-FEU — ABNORMAL HIGH (ref 0.00–0.50)

## 2022-04-11 LAB — URINALYSIS, COMPLETE (UACMP) WITH MICROSCOPIC
Bacteria, UA: NONE SEEN
Bilirubin Urine: NEGATIVE
Glucose, UA: >=500 mg/dL — AB
Ketones, ur: NEGATIVE mg/dL
Leukocytes,Ua: NEGATIVE
Nitrite: NEGATIVE
Protein, ur: 30 mg/dL — AB
Specific Gravity, Urine: 1.023 (ref 1.005–1.030)
Squamous Epithelial / HPF: NONE SEEN (ref 0–5)
pH: 6 (ref 5.0–8.0)

## 2022-04-11 LAB — CBC WITH DIFFERENTIAL/PLATELET
Abs Immature Granulocytes: 0.07 10*3/uL (ref 0.00–0.07)
Basophils Absolute: 0 10*3/uL (ref 0.0–0.1)
Basophils Relative: 0 %
Eosinophils Absolute: 0 10*3/uL (ref 0.0–0.5)
Eosinophils Relative: 0 %
HCT: 47.1 % (ref 39.0–52.0)
Hemoglobin: 14 g/dL (ref 13.0–17.0)
Immature Granulocytes: 1 %
Lymphocytes Relative: 6 %
Lymphs Abs: 0.8 10*3/uL (ref 0.7–4.0)
MCH: 26.3 pg (ref 26.0–34.0)
MCHC: 29.7 g/dL — ABNORMAL LOW (ref 30.0–36.0)
MCV: 88.5 fL (ref 80.0–100.0)
Monocytes Absolute: 1.7 10*3/uL — ABNORMAL HIGH (ref 0.1–1.0)
Monocytes Relative: 13 %
Neutro Abs: 10.2 10*3/uL — ABNORMAL HIGH (ref 1.7–7.7)
Neutrophils Relative %: 80 %
Platelets: 264 10*3/uL (ref 150–400)
RBC: 5.32 MIL/uL (ref 4.22–5.81)
RDW: 16.8 % — ABNORMAL HIGH (ref 11.5–15.5)
WBC: 12.8 10*3/uL — ABNORMAL HIGH (ref 4.0–10.5)
nRBC: 0 % (ref 0.0–0.2)

## 2022-04-11 LAB — URINE DRUG SCREEN, QUALITATIVE (ARMC ONLY)
Amphetamines, Ur Screen: NOT DETECTED
Barbiturates, Ur Screen: NOT DETECTED
Benzodiazepine, Ur Scrn: NOT DETECTED
Cannabinoid 50 Ng, Ur ~~LOC~~: NOT DETECTED
Cocaine Metabolite,Ur ~~LOC~~: NOT DETECTED
MDMA (Ecstasy)Ur Screen: NOT DETECTED
Methadone Scn, Ur: NOT DETECTED
Opiate, Ur Screen: NOT DETECTED
Phencyclidine (PCP) Ur S: NOT DETECTED
Tricyclic, Ur Screen: NOT DETECTED

## 2022-04-11 LAB — BLOOD GAS, ARTERIAL
Acid-base deficit: 1.1 mmol/L (ref 0.0–2.0)
Bicarbonate: 24.3 mmol/L (ref 20.0–28.0)
FIO2: 40 %
MECHVT: 420 mL
O2 Saturation: 100 %
PEEP: 5 cmH2O
Patient temperature: 37
RATE: 16 resp/min
pCO2 arterial: 42 mmHg (ref 32–48)
pH, Arterial: 7.37 (ref 7.35–7.45)
pO2, Arterial: 159 mmHg — ABNORMAL HIGH (ref 83–108)

## 2022-04-11 LAB — COMPREHENSIVE METABOLIC PANEL
ALT: 16 U/L (ref 0–44)
AST: 30 U/L (ref 15–41)
Albumin: 2.3 g/dL — ABNORMAL LOW (ref 3.5–5.0)
Alkaline Phosphatase: 180 U/L — ABNORMAL HIGH (ref 38–126)
Anion gap: 7 (ref 5–15)
BUN: 89 mg/dL — ABNORMAL HIGH (ref 8–23)
CO2: 28 mmol/L (ref 22–32)
Calcium: 8.9 mg/dL (ref 8.9–10.3)
Chloride: 117 mmol/L — ABNORMAL HIGH (ref 98–111)
Creatinine, Ser: 2.06 mg/dL — ABNORMAL HIGH (ref 0.61–1.24)
GFR, Estimated: 29 mL/min — ABNORMAL LOW (ref >60)
Glucose, Bld: 868 mg/dL (ref 70–99)
Potassium: 4.8 mmol/L (ref 3.5–5.1)
Sodium: 152 mmol/L — ABNORMAL HIGH (ref 135–145)
Total Bilirubin: 0.6 mg/dL (ref 0.3–1.2)
Total Protein: 7.4 g/dL (ref 6.5–8.1)

## 2022-04-11 LAB — TROPONIN I (HIGH SENSITIVITY)
Troponin I (High Sensitivity): 91 ng/L — ABNORMAL HIGH (ref <18)
Troponin I (High Sensitivity): 3965 ng/L (ref <18)

## 2022-04-11 LAB — MRSA NEXT GEN BY PCR, NASAL: MRSA by PCR Next Gen: DETECTED — AB

## 2022-04-11 LAB — RESP PANEL BY RT-PCR (FLU A&B, COVID) ARPGX2
Influenza A by PCR: NEGATIVE
Influenza B by PCR: NEGATIVE
SARS Coronavirus 2 by RT PCR: POSITIVE — AB

## 2022-04-11 LAB — PROTIME-INR
INR: 1.4 — ABNORMAL HIGH (ref 0.8–1.2)
Prothrombin Time: 17.1 seconds — ABNORMAL HIGH (ref 11.4–15.2)

## 2022-04-11 LAB — CBG MONITORING, ED
Glucose-Capillary: 580 mg/dL (ref 70–99)
Glucose-Capillary: >600 mg/dL (ref 70–99)
Glucose-Capillary: 488 mg/dL — ABNORMAL HIGH (ref 70–99)
Glucose-Capillary: 562 mg/dL (ref 70–99)

## 2022-04-11 LAB — GLUCOSE, CAPILLARY
Glucose-Capillary: 110 mg/dL — ABNORMAL HIGH (ref 70–99)
Glucose-Capillary: 113 mg/dL — ABNORMAL HIGH (ref 70–99)
Glucose-Capillary: 128 mg/dL — ABNORMAL HIGH (ref 70–99)
Glucose-Capillary: 217 mg/dL — ABNORMAL HIGH (ref 70–99)
Glucose-Capillary: 420 mg/dL — ABNORMAL HIGH (ref 70–99)
Glucose-Capillary: 420 mg/dL — ABNORMAL HIGH (ref 70–99)

## 2022-04-11 LAB — PROCALCITONIN: Procalcitonin: 0.3 ng/mL

## 2022-04-11 LAB — LACTIC ACID, PLASMA
Lactic Acid, Venous: 3.8 mmol/L (ref 0.5–1.9)
Lactic Acid, Venous: 2.3 mmol/L (ref 0.5–1.9)

## 2022-04-11 LAB — OSMOLALITY, URINE: Osmolality, Ur: 571 mOsm/kg (ref 300–900)

## 2022-04-11 LAB — BRAIN NATRIURETIC PEPTIDE: B Natriuretic Peptide: 110.6 pg/mL — ABNORMAL HIGH (ref 0.0–100.0)

## 2022-04-11 LAB — SODIUM, URINE, RANDOM: Sodium, Ur: 47 mmol/L

## 2022-04-11 LAB — APTT: aPTT: 33 seconds (ref 24–36)

## 2022-04-11 MED ORDER — POLYETHYLENE GLYCOL 3350 17 G PO PACK
17.0000 g | PACK | Freq: Every day | ORAL | Status: DC
  Administered 2022-04-12: 17 g
  Filled 2022-04-11: qty 1

## 2022-04-11 MED ORDER — MIDAZOLAM BOLUS VIA INFUSION: 0.0000 mg | INTRAVENOUS | Status: DC | PRN

## 2022-04-11 MED ORDER — POLYETHYLENE GLYCOL 3350 17 G PO PACK: 17.0000 g | PACK | Freq: Every day | ORAL | Status: DC | PRN

## 2022-04-11 MED ORDER — SODIUM CHLORIDE 0.9 % IV SOLN
2.0000 g | INTRAVENOUS | Status: DC
  Administered 2022-04-12 – 2022-04-13 (×2): 2 g via INTRAVENOUS
  Filled 2022-04-11 (×2): qty 2

## 2022-04-11 MED ORDER — MIDAZOLAM-SODIUM CHLORIDE 100-0.9 MG/100ML-% IV SOLN: 0.5000 mg/h | INTRAVENOUS | Status: DC

## 2022-04-11 MED ORDER — VASOPRESSIN 20 UNITS/100 ML INFUSION FOR SHOCK
0.0000 [IU]/min | INTRAVENOUS | Status: DC
  Administered 2022-04-11 – 2022-04-13 (×3): 0.03 [IU]/min via INTRAVENOUS
  Filled 2022-04-11 (×6): qty 100

## 2022-04-11 MED ORDER — ETOMIDATE 2 MG/ML IV SOLN
INTRAVENOUS | Status: AC
  Administered 2022-04-11: 20 mg via INTRAVENOUS
  Filled 2022-04-11: qty 10

## 2022-04-11 MED ORDER — VANCOMYCIN HCL 500 MG/100ML IV SOLN
500.0000 mg | Freq: Once | INTRAVENOUS | Status: AC
  Administered 2022-04-11: 500 mg via INTRAVENOUS
  Filled 2022-04-11: qty 100

## 2022-04-11 MED ORDER — MIDAZOLAM-SODIUM CHLORIDE 100-0.9 MG/100ML-% IV SOLN
INTRAVENOUS | Status: AC
  Administered 2022-04-11: 2 mg/h via INTRAVENOUS
  Filled 2022-04-11: qty 100

## 2022-04-11 MED ORDER — PHENYLEPHRINE HCL-NACL 20-0.9 MG/250ML-% IV SOLN
25.0000 ug/min | INTRAVENOUS | Status: DC
  Administered 2022-04-11: 200 ug/min via INTRAVENOUS
  Administered 2022-04-11: 25 ug/min via INTRAVENOUS
  Filled 2022-04-11 (×4): qty 250

## 2022-04-11 MED ORDER — DEXTROSE IN LACTATED RINGERS 5 % IV SOLN: INTRAVENOUS | Status: DC

## 2022-04-11 MED ORDER — SUCCINYLCHOLINE CHLORIDE 200 MG/10ML IV SOSY
PREFILLED_SYRINGE | INTRAVENOUS | Status: AC
  Administered 2022-04-11: 100 mg via INTRAVENOUS
  Filled 2022-04-11: qty 10

## 2022-04-11 MED ORDER — PHENYLEPHRINE CONCENTRATED 100MG/250ML (0.4 MG/ML) INFUSION SIMPLE
0.0000 ug/min | INTRAVENOUS | Status: DC
  Administered 2022-04-11: 225 ug/min via INTRAVENOUS
  Administered 2022-04-12: 400 ug/min via INTRAVENOUS
  Administered 2022-04-12: 275 ug/min via INTRAVENOUS
  Administered 2022-04-12 – 2022-04-13 (×5): 400 ug/min via INTRAVENOUS
  Filled 2022-04-11 (×10): qty 250

## 2022-04-11 MED ORDER — VECURONIUM BROMIDE 10 MG IV SOLR
10.0000 mg | INTRAVENOUS | Status: DC | PRN
  Administered 2022-04-12 – 2022-04-13 (×2): 10 mg via INTRAVENOUS
  Filled 2022-04-11 (×2): qty 10

## 2022-04-11 MED ORDER — ORAL CARE MOUTH RINSE
15.0000 mL | OROMUCOSAL | Status: DC
  Administered 2022-04-11 – 2022-04-13 (×20): 15 mL via OROMUCOSAL

## 2022-04-11 MED ORDER — CHLORHEXIDINE GLUCONATE CLOTH 2 % EX PADS: 6.0000 | MEDICATED_PAD | Freq: Every day | CUTANEOUS | Status: DC

## 2022-04-11 MED ORDER — NOREPINEPHRINE 4 MG/250ML-% IV SOLN
INTRAVENOUS | Status: AC
  Filled 2022-04-11: qty 250

## 2022-04-11 MED ORDER — VANCOMYCIN HCL IN DEXTROSE 1-5 GM/200ML-% IV SOLN
1000.0000 mg | Freq: Once | INTRAVENOUS | Status: AC
  Administered 2022-04-11: 1000 mg via INTRAVENOUS
  Filled 2022-04-11: qty 200

## 2022-04-11 MED ORDER — DOCUSATE SODIUM 50 MG/5ML PO LIQD
100.0000 mg | Freq: Two times a day (BID) | ORAL | Status: DC
  Administered 2022-04-12: 100 mg
  Filled 2022-04-11 (×2): qty 10

## 2022-04-11 MED ORDER — INSULIN REGULAR(HUMAN) IN NACL 100-0.9 UT/100ML-% IV SOLN
INTRAVENOUS | Status: DC
  Administered 2022-04-11: 7.5 [IU]/h via INTRAVENOUS
  Filled 2022-04-11: qty 100

## 2022-04-11 MED ORDER — ACETAMINOPHEN 325 MG PO TABS
650.0000 mg | ORAL_TABLET | ORAL | Status: DC | PRN
  Administered 2022-04-11: 650 mg
  Filled 2022-04-11: qty 2

## 2022-04-11 MED ORDER — DEXTROSE 50 % IV SOLN
0.0000 mL | INTRAVENOUS | Status: DC | PRN
  Administered 2022-04-12: 50 mL via INTRAVENOUS
  Filled 2022-04-11: qty 50

## 2022-04-11 MED ORDER — PANTOPRAZOLE SODIUM 40 MG IV SOLR
40.0000 mg | Freq: Every day | INTRAVENOUS | Status: DC
  Administered 2022-04-11 – 2022-04-12 (×2): 40 mg via INTRAVENOUS
  Filled 2022-04-11 (×2): qty 10

## 2022-04-11 MED ORDER — ORAL CARE MOUTH RINSE: 15.0000 mL | OROMUCOSAL | Status: DC | PRN

## 2022-04-11 MED ORDER — DOCUSATE SODIUM 100 MG PO CAPS: 100.0000 mg | ORAL_CAPSULE | Freq: Two times a day (BID) | ORAL | Status: DC | PRN

## 2022-04-11 MED ORDER — FENTANYL 2500MCG IN NS 250ML (10MCG/ML) PREMIX INFUSION
0.0000 ug/h | INTRAVENOUS | Status: DC
  Administered 2022-04-11: 25 ug/h via INTRAVENOUS
  Administered 2022-04-13: 100 ug/h via INTRAVENOUS
  Administered 2022-04-13: 400 ug/h via INTRAVENOUS
  Filled 2022-04-11 (×3): qty 250

## 2022-04-11 MED ORDER — LACTATED RINGERS IV BOLUS (SEPSIS)
1000.0000 mL | Freq: Once | INTRAVENOUS | Status: AC
  Administered 2022-04-11: 1000 mL via INTRAVENOUS

## 2022-04-11 MED ORDER — FENTANYL BOLUS VIA INFUSION: 25.0000 ug | INTRAVENOUS | Status: DC | PRN

## 2022-04-11 MED ORDER — SODIUM CHLORIDE 0.9 % IV SOLN
2.0000 g | Freq: Once | INTRAVENOUS | Status: AC
  Administered 2022-04-11: 2 g via INTRAVENOUS
  Filled 2022-04-11: qty 12.5

## 2022-04-11 MED ORDER — DOCUSATE SODIUM 50 MG/5ML PO LIQD: 50.0000 mg | Freq: Two times a day (BID) | ORAL | Status: DC | PRN

## 2022-04-11 MED ORDER — VANCOMYCIN VARIABLE DOSE PER UNSTABLE RENAL FUNCTION (PHARMACIST DOSING): Status: DC

## 2022-04-11 MED ORDER — MIDAZOLAM-SODIUM CHLORIDE 100-0.9 MG/100ML-% IV SOLN
0.0000 mg/h | INTRAVENOUS | Status: DC
  Administered 2022-04-11: 3 mg/h via INTRAVENOUS
  Administered 2022-04-12 – 2022-04-13 (×2): 5 mg/h via INTRAVENOUS
  Filled 2022-04-11 (×2): qty 100

## 2022-04-11 MED ORDER — SODIUM CHLORIDE 0.9 % IV SOLN
250.0000 mL | INTRAVENOUS | Status: DC
  Administered 2022-04-11: 250 mL via INTRAVENOUS

## 2022-04-11 NOTE — ED Notes (Signed)
Pt. Returned to room from CT. Dr. Mortimer Fries and Darlyn Chamber notified that pt's temp is trending up, 102.6, HR is still in 140's, BP is soft, and there is pink output from pt's NG tube.

## 2022-04-11 NOTE — ED Notes (Signed)
Date and time results received: 05/03/2022 1428 (use smartphrase ".now" to insert current time)  Test: glucose Critical Value: 868  Name of Provider Notified: Siadecki  Orders Received? Or Actions Taken?:

## 2022-04-11 NOTE — Sepsis Progress Note (Signed)
Notified bedside nurse of need to draw repeat lactic acid. 

## 2022-04-11 NOTE — ED Notes (Signed)
Critical lactic acid called from lab Lactic acid : 3.8 Dr. Cherylann Banas notified [phone]

## 2022-04-11 NOTE — Progress Notes (Signed)
CODE SEPSIS - PHARMACY COMMUNICATION  **Broad Spectrum Antibiotics should be administered within 1 hour of Sepsis diagnosis**  Time Code Sepsis Called/Page Received: 1346  Antibiotics Ordered: cefepime 2 g and vancomycin 1 g  Time of 1st antibiotic administration: 1447  Additional action taken by pharmacy: [place]   Lorin Picket ,PharmD Clinical Pharmacist  07-May-2022  1:50 PM

## 2022-04-11 NOTE — Procedures (Signed)
Central Venous Catheter Insertion Procedure Note  Christopher [name]  [id number]  [birth year]  Date:2022-04-16  Time:8:55 PM   Provider Performing:Alverna Fawley A Maryuri Warnke   Procedure: Insertion of Non-tunneled Central Venous [phone]) with US guidance ([id number])   Indication(s) Medication administration and Difficult access  Consent Risks of the procedure as well as the alternatives and risks of each were explained to the patient and/or caregiver.  Consent for the procedure was obtained and is signed in the bedside chart  Anesthesia Topical only with 1% lidocaine   Timeout Verified patient identification, verified procedure, site/side was marked, verified correct patient position, special equipment/implants available, medications/allergies/relevant history reviewed, required imaging and test results available.  Sterile Technique Maximal sterile technique including full sterile barrier drape, hand hygiene, sterile gown, sterile gloves, mask, hair covering, sterile ultrasound probe cover (if used).  Procedure Description Area of catheter insertion was cleaned with chlorhexidine and draped in sterile fashion.  With real-time ultrasound guidance a central venous catheter was placed into the right internal jugular vein. Nonpulsatile blood flow and easy flushing noted in all ports.  The catheter was sutured in place and sterile dressing applied.  Complications/Tolerance None; patient tolerated the procedure well. Chest X-ray is ordered to verify placement for internal jugular or subclavian cannulation.   Chest x-ray is not ordered for femoral cannulation.  EBL Minimal  Specimen(s) None  [name], DNP, CCRN, FNP-C, AGACNP-BC Acute Care & Family Nurse Practitioner  [facility]  See Amion for personal pager PCCM on call pager [phone] until 7 am

## 2022-04-11 NOTE — ED Notes (Signed)
Attempted to call report to ICU, states they are not ready for the pt. Charge nurse notified.

## 2022-04-11 NOTE — Consult Note (Signed)
Pharmacy Antibiotic Note  Christopher [name] is a 88 y.o. male admitted on 04/14/2022 with sepsis.  Pharmacy has been consulted for cefepime and vancomycin dosing.  -Presented w/ AMS and fever from SNF. Patient is COVID+.  -AKI (Baseline SCr: 0.6-0.8)  -CXR: streaky bibasilar lung opacities    Plan: -Cefepime 2 grams IV Q24h -Vancomycin 1500 mg LD (1000mg  + 500mg ) -Dose based on level due to AKI -Obtain MRSA screen -Daily SCr  Height: 5\' 2"  (157.5 cm) IBW/kg (Calculated) : 54.6  Temp (24hrs), Avg:100.5 F (38.1 C), Min:98.3 F (36.8 C), Max:102.6 F (39.2 C)  Recent Labs  Lab 05/03/2022 1346  WBC 12.8*  CREATININE 2.06*  LATICACIDVEN 3.8*    Estimated Creatinine Clearance: 16.9 mL/min (A) (by C-G formula based on SCr of 2.06 mg/dL (H)).    Allergies  Allergen Reactions   Hydrochlorothiazide     Unknown reaction   Beef-Derived Products Other (See Comments)    Does not eat due to religious beliefs   Hydrochlorothiazide Other (See Comments)    Unknown reaction   Penicillins Other (See Comments)    Unknown reaction   Pork-Derived Products Other (See Comments)    Does not eat due to religious beliefs    Antimicrobials this admission: cefepime 10/6 >>  vancomycin 10/6 >>   Dose adjustments this admission: N/A  Microbiology results: 10/6 BCx: pending 10/6 UCx: pending  10/6 Sputum: pending  10/6 MRSA PCR: pending  Thank you for allowing pharmacy to be a part of this patient's care.  Lorin Picket 05/05/2022 3:15 PM

## 2022-04-11 NOTE — Consult Note (Signed)
PHARMACY -  BRIEF ANTIBIOTIC NOTE   Pharmacy has received consult(s) for vancomycin and cefepime from an ED provider.  The patient's profile has been reviewed for ht/wt/allergies/indication/available labs.    One time order(s) placed for cefepime 2 grams and vancomycin 1 gram.  Further antibiotics/pharmacy consults should be ordered by admitting physician if indicated.                       Thank you, Lorin Picket 2022/04/23  1:48 PM

## 2022-04-11 NOTE — ED Provider Notes (Signed)
[facility] Provider Note    Event Date/Time   First MD Initiated Contact with Patient 04-21-22 1339     (approximate)   History   Altered Mental Status (From SNF, Called fro AMS, Hypotensive BP 90/60)   HPI  Christopher [name] is a 88 y.o. male with a history of diabetes and intracranial hemorrhage who presents from his nursing facility with altered mental status of unknown onset.  The patient is unresponsive and unable to give any history.  I reviewed the past medical records.  The patient was admitted in late April with an intracranial hemorrhage, intubated, and operated on.  He was transition to a tracheostomy and discharged to nursing facility in early May.  He is listed on the chart and nursing home paperwork as DNR, but there is no mention of wishes surrounding intubation.   Physical Exam   Triage Vital Signs: ED Triage Vitals  Enc Vitals Group     BP 2022/04/21 1340 116/80     Pulse Rate Apr 21, 2022 1340 (!) 125     Resp 04-21-2022 1340 (!) 30     Temp April 21, 2022 1340 98.3 F (36.8 C)     Temp Source 2022-04-21 1340 Axillary     SpO2 April 21, 2022 1341 96 %     Weight --      Height --      Head Circumference --      Peak Flow --      Pain Score --      Pain Loc --      Pain Edu? --      Excl. in GC? --     Most recent vital signs: Vitals:   Apr 21, 2022 1341 2022/04/21 1401  BP:    Pulse:    Resp:    Temp:  (!) 102.6 F (39.2 C)  SpO2: 96%      General: Somnolent, unresponsive. CV:  Good peripheral perfusion.  Tachycardic Resp:  Tachypnea with diminished respiratory effort and periods of apnea.  Decreased breath sounds bilaterally. Abd:  No distention.  Other:  No peripheral edema.  Dry mucous membranes.   ED Results / Procedures / Treatments   Labs (all labs ordered are listed, but only abnormal results are displayed) Labs Reviewed  RESP PANEL BY RT-PCR (FLU A&B, COVID) ARPGX2 - Abnormal; Notable for the following components:      Result  Value   SARS Coronavirus 2 by RT PCR POSITIVE (*)    All other components within normal limits  LACTIC ACID, PLASMA - Abnormal; Notable for the following components:   Lactic Acid, Venous 3.8 (*)    All other components within normal limits  COMPREHENSIVE METABOLIC PANEL - Abnormal; Notable for the following components:   Sodium 152 (*)    Chloride 117 (*)    Glucose, Bld 868 (*)    BUN 89 (*)    Creatinine, Ser 2.06 (*)    Albumin 2.3 (*)    Alkaline Phosphatase 180 (*)    GFR, Estimated 29 (*)    All other components within normal limits  CBC WITH DIFFERENTIAL/PLATELET - Abnormal; Notable for the following components:   WBC 12.8 (*)    MCHC 29.7 (*)    RDW 16.8 (*)    Neutro Abs 10.2 (*)    Monocytes Absolute 1.7 (*)    All other components within normal limits  PROTIME-INR - Abnormal; Notable for the following components:   Prothrombin Time 17.1 (*)    INR 1.4 (*)  All other components within normal limits  URINALYSIS, COMPLETE (UACMP) WITH MICROSCOPIC - Abnormal; Notable for the following components:   Color, Urine YELLOW (*)    APPearance HAZY (*)    Glucose, UA >=500 (*)    Hgb urine dipstick SMALL (*)    Protein, ur 30 (*)    All other components within normal limits  BRAIN NATRIURETIC PEPTIDE - Abnormal; Notable for the following components:   B Natriuretic Peptide 110.6 (*)    All other components within normal limits  BLOOD GAS, ARTERIAL - Abnormal; Notable for the following components:   pO2, Arterial 159 (*)    All other components within normal limits  CBG MONITORING, ED - Abnormal; Notable for the following components:   Glucose-Capillary 580 (*)    All other components within normal limits  CBG MONITORING, ED - Abnormal; Notable for the following components:   Glucose-Capillary >600 (*)    All other components within normal limits  TROPONIN I (HIGH SENSITIVITY) - Abnormal; Notable for the following components:   Troponin I (High Sensitivity) 91 (*)     All other components within normal limits  CULTURE, BLOOD (ROUTINE X 2)  CULTURE, BLOOD (ROUTINE X 2)  URINE CULTURE  CULTURE, RESPIRATORY W GRAM STAIN  MRSA NEXT GEN BY PCR, NASAL  APTT  URINE DRUG SCREEN, QUALITATIVE ([facility] ONLY)  LACTIC ACID, PLASMA  BASIC METABOLIC PANEL  BASIC METABOLIC PANEL  BASIC METABOLIC PANEL  PROCALCITONIN  C-REACTIVE PROTEIN  FERRITIN  D-DIMER, QUANTITATIVE  OSMOLALITY  OSMOLALITY, URINE  SODIUM, URINE, RANDOM  TROPONIN I (HIGH SENSITIVITY)     EKG  ED ECG REPORT I, Dionne Bucy, the attending physician, personally viewed and interpreted this ECG.  Date: 05-04-22 EKG Time: 1340 Rate: 125 Rhythm: Sinus tachycardia QRS Axis: normal Intervals: normal ST/T Wave abnormalities: Nonspecific ST abnormalities Narrative Interpretation: no evidence of acute ischemia    RADIOLOGY  Chest x-ray: I independently viewed and interpreted the images; the endotracheal tube is in satisfactory position and there is no focal consolidation or edema  PROCEDURES:  Critical Care performed: Yes, see critical care procedure note(s)  .Critical Care  Performed by: Dionne Bucy, MD Authorized by: Dionne Bucy, MD   Critical care provider statement:    Critical care time (minutes):  45   Critical care was time spent personally by me on the following activities:  Development of treatment plan with patient or surrogate, discussions with consultants, evaluation of patient's response to treatment, examination of patient, ordering and review of laboratory studies, ordering and review of radiographic studies, ordering and performing treatments and interventions, pulse oximetry, re-evaluation of patient's condition, review of old charts and obtaining history from patient or surrogate   Care discussed with: admitting provider      MEDICATIONS ORDERED IN ED: Medications  midazolam (VERSED) 100 mg/100 mL (1 mg/mL) premix infusion (2 mg/hr  Intravenous New Bag/Given 05/04/22 1420)  norepinephrine (LEVOPHED) 4-5 MG/250ML-% infusion SOLN (has no administration in time range)  insulin regular, human (MYXREDLIN) 100 units/ 100 mL infusion (9 Units/hr Intravenous Rate/Dose Change May 04, 2022 1603)  dextrose 5 % in lactated ringers infusion (has no administration in time range)  dextrose 50 % solution 0-50 mL (has no administration in time range)  fentaNYL in NS (14mcg/ml) infusion-PREMIX (has no administration in time range)  polyethylene glycol (MIRALAX / GLYCOLAX) packet 17 g (has no administration in time range)  pantoprazole (PROTONIX) injection 40 mg (has no administration in time range)  docusate (COLACE) 50 MG/5ML  liquid 50 mg (has no administration in time range)  ceFEPIme (MAXIPIME) 2 g in sodium chloride 0.9 % 100 mL IVPB (has no administration in time range)  vancomycin (VANCOREADY) IVPB 500 mg/100 mL (has no administration in time range)  vancomycin variable dose per unstable renal function (pharmacist dosing) (has no administration in time range)  lactated ringers bolus 1,000 mL (0 mLs Intravenous Stopped 05/03/22 1445)    And  lactated ringers bolus 1,000 mL (1,000 mLs Intravenous New Bag/Given 2022/05/03 1230)  ceFEPIme (MAXIPIME) 2 g in sodium chloride 0.9 % 100 mL IVPB (2 g Intravenous New Bag/Given 03-May-2022 1447)  vancomycin (VANCOCIN) IVPB 1000 mg/200 mL premix (1,000 mg Intravenous New Bag/Given 03-May-2022 1515)  succinylcholine (ANECTINE) 200 MG/10ML syringe (100 mg Intravenous Given May 03, 2022 1407)  etomidate (AMIDATE) 2 MG/ML injection (20 mg Intravenous Given 05-03-2022 1407)     IMPRESSION / MDM / ASSESSMENT AND PLAN / ED COURSE  I reviewed the triage vital signs and the nursing notes.  88 year old male with PMH as noted above presents with altered mental status, found to be unresponsive and with rapid and shallow respirations.  On exam he is febrile and tachycardic as well as tachypneic to as high as a rate of  60.  I thoroughly reviewed the past medical records and the nursing home paperwork.  The patient is DNR but was intubated several months ago and records indicate that his family wanted aggressive care.  I called his son, explained the patient's status and prognosis, and confirmed his son's understanding of his wishes that he would like everything done short of CPR.  I specifically discussed risks and benefits of intubation and placement on a ventilator and the son was certain that the patient would want this done.  Therefore, due to acute respiratory failure I proceeded with intubation, which was uncomplicated.  Differential diagnosis includes, but is not limited to, DKA, HHS, other metabolic disturbance, acute infection/sepsis, less likely primary CNS or cardiac cause.  I have ordered IV empiric antibiotics and fluids and we will obtain chest x-ray, lab work-up, CT head, and reassess.  Patient's presentation is most consistent with acute presentation with potential threat to life or bodily function.  The patient is on the cardiac monitor to evaluate for evidence of arrhythmia and/or significant heart rate changes.  ----------------------------------------- 3:23 PM on 05-03-22 -----------------------------------------  Chest x-ray shows the endotracheal tube in good position.  The patient is significantly hyperglycemic although with no anion gap.  Lactate is also elevated.  This is most consistent with HHS and possible sepsis.  I started the patient on an insulin infusion.  Empiric antibiotics have been ordered.  I consulted Dr. Stoney Bang from the ICU who agrees to evaluate the patient for admission.   FINAL CLINICAL IMPRESSION(S) / ED DIAGNOSES   Final diagnoses:  Hyperosmolar hyperglycemic state (HHS) ([place])  Acute respiratory failure with hypoxia (HCC)  Altered mental status, unspecified altered mental status type  Sepsis, due to unspecified organism, unspecified whether acute organ  dysfunction present [facility])     Rx / DC Orders   ED Discharge Orders     None        Note:  This document was prepared using Dragon voice recognition software and may include unintentional dictation errors.    Arta Silence, MD May 03, 2022 1624

## 2022-04-11 NOTE — Sepsis Progress Note (Signed)
Sepsis protocol monitored by eLink 

## 2022-04-11 NOTE — IPAL (Signed)
  Interdisciplinary Goals of Care Family Meeting   Date carried out: 04-19-22  Location of the meeting: Phone conference  Member's involved: Physician and Family Member or next of kin  Durable Power of Attorney or acting medical decision maker: Patient's son   Christopher [name], Christopher [name]  Discussion: We discussed goals of care for [name] . I spoke with patient's son in details following change in patient's current status. I reviewed patient's worsening labs, vital signs including unstable HR and blood pressure requiring multiple pressors (Neo and Vasopressin) and overall poor prognosis and answered all his questions.   Discussed prognosis, expected outcome with or without ongoing aggressive treatments and the options for de-escalation of care.  Code status: Limited Code or DNR with short term  Disposition: Continue current acute care  Time spent for the meeting: 25 minutes     [name], DNP, CCRN, FNP-C, AGACNP-BC Acute Care & Family Nurse Practitioner  [place]  See Amion for personal pager PCCM on call pager [phone] until 7 am

## 2022-04-11 NOTE — ED Notes (Signed)
ED Provider at bedside. 

## 2022-04-11 NOTE — H&P (Addendum)
NAME:  Christopher [name], MRN:  [id number], DOB:  [birth year], LOS: 0 ADMISSION DATE:  04/28/2022, CONSULTATION DATE:  04/21/2022 REFERRING MD:  Dr. Cherylann Banas, CHIEF COMPLAINT:  Altered Mental Status, hypotension, respiratory distress   Brief Pt Description / Synopsis:  88 year old male with past medical history most significant for intracerebral hemorrhage and stroke admitted with acute metabolic encephalopathy, multifactorial shock (hypovolemic +/- septic), HHS, hypernatremia, Acute Hypoxic Respiratory Failure due to COVID infection and suspected superimposed bacterial/aspiration pneumonia, and AKI.  Required intubation and mechanical ventilation in the ED.  History of Present Illness:  Christopher [name] is a 88 y.o. male with a past medical history significant for intracranial hemorrhage, stroke who presents to [facility] ED on 05/01/2022 from his nursing facility due to altered mental status (unresponsive ) of unknown onset and hypotension.  Patient is currently intubated and sedated, and family is currently unavailable, therefore history is obtained from chart review.  Per ED and nursing notes, the patient was found unresponsive with rapid and shallow respirations.  On exam he was noted to be febrile, tachycardic, and tachypneic with respiratory rate as high as 60.  Patient had previously been DNR but was intubated several months ago.  Given his respiratory status, ER physician discussed with patient's son who wished to pursue aggressive care and have everything done just short of CPR.  Patient was intubated in the ED.  ED Course: Initial Vital Signs: Temperature 98.3 F axillary, respiratory rate 30, pulse 125, blood pressure 116/80, SPO2 96% Significant Labs: Sodium 152, chloride 117, glucose 868, bicarb 28, BUN 89, creatinine 2.06, alkaline phosphatase 180, albumin 2.3, BNP 110, high-sensitivity troponin 91, lactic acid 3.8, WBC 12.8 with neutrophilia, INR 1.4, PT 17.1 COVID-19 PCR is positive Urinalysis not  concerning for UTI Urine drug screen negative Imaging Chest X-ray>>FINDINGS: Endotracheal tube tip is 2.5 cm above the carina. Stable cardiomediastinal silhouette with normal heart size and prominence of the aortic arch contour. No pneumothorax. Chronic slight blunting of the right costophrenic angle. No left pleural effusion. No overt pulmonary edema. Low lung volumes with streaky bibasilar lung opacities. CT head without contrast>>IMPRESSION: 1. No acute intracranial abnormality. 2. Generalized cerebral atrophy with widening of the extra-axial spaces and ventricular dilatation. 3. Right frontoparietal craniotomy defect. 4. Left maxillary sinus, bilateral ethmoid sinus and sphenoid sinus disease. Medications Administered: 2 L LR boluses, IV cefepime and vancomycin, insulin drip initiated   PCCM is asked to admit the patient for further work-up and treatment.  Please see "significant hospital events" section below for full detailed hospital course.   Pertinent  Medical History   Past Medical History:  Diagnosis Date   Arthritis    Ataxia    Diabetes mellitus without complication ([place])    controlled, pt checks it in the morning    Dyslipidemia    Hypertension    somewhat controlled, pt checks every morning, he reports it has been good.   Stroke [facility])    TIA (transient ischemic attack)     Micro Data:  10/6: COVID-19 PCR>> positive 10/6: Influenza PCR>>Negative 10/6: Blood culture x2>> 10/6: Urine>> 10/6: Tracheal aspirate>> 10/6: Strep pneumo urinary antigen>> 10/6: Legionella urinary antigen>>  Antimicrobials:  Cefepime 10/6>> Vancomycin 10/6>>  Significant Hospital Events: Including procedures, antibiotic start and stop dates in addition to other pertinent events   10/6: Presented to ED, required intubation in the ED.  PCCM asked to admit.  Interim History / Subjective:  -Patient required intubation in the ED -Remains febrile and tachycardic, blood pressure  is  soft and overbreathing the vent -Sedated on fentanyl and Versed drips -  Objective   Blood pressure 116/80, pulse (!) 125, temperature (!) 102.6 F (39.2 C), temperature source Rectal, resp. rate (!) 30, height 5\' 2"  (1.575 m), SpO2 96 %.    Vent Mode: AC FiO2 (%):  [40 %] 40 % Set Rate:  [16 bmp] 16 bmp Vt Set:  [420 mL] 420 mL PEEP:  [5 cmH20] 5 cmH20   Intake/Output Summary (Last 24 hours) at 05/05/2022 1515 Last data filed at 04/07/2022 1445 Gross per 24 hour  Intake 1000 ml  Output --  Net 1000 ml   There were no vitals filed for this visit.  Examination: General: Acute on chronically ill-appearing male, laying in bed, intubated sedated, no acute distress HENT: Atraumatic, normocephalic, neck supple, no JVD Lungs: Coarse breath sounds bilaterally, overbreathing the vent, even Cardiovascular: Tachycardia, regular rhythm, S1-S2, no murmurs, rubs, gallops Abdomen: Soft, nontender, nondistended, no guarding rebound tenderness, bowel sounds positive x4 Extremities: Normal bulk and tone, no deformities, no edema Neuro: Sedated, currently unresponsive, pupils very sluggish at 4 mm bilaterally GU: Foley catheter in place draining dark yellow urine  Resolved Hospital Problem list     Assessment & Plan:   Hypotension: Hypovolemic in setting of HHS +/- ? Septic with COVID and pneumonia Tachycardia in setting of sepsis and fever Mildly elevated Troponin, suspect demand ischemia PMHx: HTN -Continuous cardiac monitoring -Maintain MAP >65 -IV fluids -Vasopressors as needed to maintain MAP goal -Trend lactic acid until normalized -Trend HS Troponin until peaked -Obtain Echocardiogram  -Check TSH  Severe Sepsis in setting of COVID-19 Infection, ? Superimposed bacterial/aspiration pneumonia -Monitor fever curve -Trend WBC's, Procalcitonin, & inflammatory markers -Follow cultures as above -Continue empiric Cefepime & Vancomycin pending cultures &  sensitivities  Hyperosmolar Hyperglycemic State (HHS), likely precipitated by COVID infection PMHx: Diabetes Mellitus -Follow HHS protocol -BMP q4h -Aggressive IVF -Insulin gtt -Check serum osmo, urine osmo & urine Na -Once serum Osmo <315, can transition to SSI and basal insulin -Consult Diabetes Coordinator -Check Hgb A1c  Acute Kidney Injury Hypernatremia in setting of dehydration with HHS Hyperchloremia -Monitor I&O's / urinary output -Follow BMP -Ensure adequate renal perfusion -Avoid nephrotoxic agents as able -Replace electrolytes as indicated -IV Fluids -Low threshold for nephrology consultation  Acute Metabolic Encephalopathy in setting of HHS, Hypernatremia, & AKI Sedation needs in setting of mechanical ventilation PMHx: ICH, CVA CT head on admission negative for acute intracranial abnormality Urine drug screen negative -Treatment of metabolic derangements as outlined above -Maintain a RASS goal of 0 to -1 -Fentanyl and Versed as needed to maintain RASS goal -Avoid sedating medications as able -Daily wake up assessment    Pt is critically ill with multiorgan failure.  High risk for further decompensation, cardiac arrest, and death.  Pt is limited code.  Recommend full DNR status.   Best Practice (right click and "Reselect all SmartList Selections" daily)   Diet/type: NPO DVT prophylaxis: SCD GI prophylaxis: PPI Lines: N/A Foley:  yes, and is still needed Code Status:  limited Last date of multidisciplinary goals of care discussion [N/A]  Will update patient's son when he arrives to bedside.  Labs   CBC: Recent Labs  Lab 05/05/2022 1346  WBC 12.8*  NEUTROABS 10.2*  HGB 14.0  HCT 47.1  MCV 88.5  PLT 264    Basic Metabolic Panel: Recent Labs  Lab 05/03/2022 1346  NA 152*  K 4.8  CL 117*  CO2 28  GLUCOSE 868*  BUN 89*  CREATININE 2.06*  CALCIUM 8.9   GFR: Estimated Creatinine Clearance: 16.9 mL/min (A) (by C-G formula based on SCr of  2.06 mg/dL (H)). Recent Labs  Lab May 11, 2022 1346  WBC 12.8*  LATICACIDVEN 3.8*    Liver Function Tests: Recent Labs  Lab 2022-05-11 1346  AST 30  ALT 16  ALKPHOS 180*  BILITOT 0.6  PROT 7.4  ALBUMIN 2.3*   No results for input(s): "LIPASE", "AMYLASE" in the last 168 hours. No results for input(s): "AMMONIA" in the last 168 hours.  ABG    Component Value Date/Time   PHART 7.37 2022/05/11 1421   PCO2ART 42 05-11-2022 1421   PO2ART 159 (H) May 11, 2022 1421   HCO3 24.3 05/11/2022 1421   TCO2 25 10/15/2016 0928   ACIDBASEDEF 1.1 May 11, 2022 1421   O2SAT 100 05-11-2022 1421     Coagulation Profile: Recent Labs  Lab May 11, 2022 1346  INR 1.4*    Cardiac Enzymes: No results for input(s): "CKTOTAL", "CKMB", "CKMBINDEX", "TROPONINI" in the last 168 hours.  HbA1C: Hgb A1c MFr Bld  Date/Time Value Ref Range Status  10/25/2021 11:40 AM 7.6 (H) 4.8 - 5.6 % Final    Comment:    (NOTE) Pre diabetes:          5.7%-6.4%  Diabetes:              >6.4%  Glycemic control for   <7.0% adults with diabetes   10/16/2016 02:06 AM 8.7 (H) 4.8 - 5.6 % Final    Comment:    (NOTE)         Pre-diabetes: 5.7 - 6.4         Diabetes: >6.4         Glycemic control for adults with diabetes: <7.0     CBG: No results for input(s): "GLUCAP" in the last 168 hours.  Review of Systems:   Unable to assess due to intubation/sedation/AMS   Past Medical History:  He,  has a past medical history of Arthritis, Ataxia, Diabetes mellitus without complication (HCC), Dyslipidemia, Hypertension, Stroke (HCC), and TIA (transient ischemic attack).   Surgical History:   Past Surgical History:  Procedure Laterality Date   CRANIOTOMY Right 11/02/2021   Procedure: CRANIOTOMY HEMATOMA EVACUATION SUBDURAL;  Surgeon: Lucy Chris, MD;  Location: [facility] ORS;  Service: Neurosurgery;  Laterality: Right;   IR GASTROSTOMY TUBE MOD SED  11/11/2021   IR REPLACE G-TUBE SIMPLE WO FLUORO  01/15/2022   none      TRACHEOSTOMY TUBE PLACEMENT  11/02/2021   Procedure: TRACHEOSTOMY;  Surgeon: Lucy Chris, MD;  Location: [facility] ORS;  Service: Neurosurgery;;     Social History:   reports that he has never smoked. He has never used smokeless tobacco. He reports that he does not drink alcohol and does not use drugs.   Family History:  His family history is negative for CAD and Hypertension.   Allergies Allergies  Allergen Reactions   Hydrochlorothiazide     Unknown reaction   Beef-Derived Products Other (See Comments)    Does not eat due to religious beliefs   Hydrochlorothiazide Other (See Comments)    Unknown reaction   Penicillins Other (See Comments)    Unknown reaction   Pork-Derived Products Other (See Comments)    Does not eat due to religious beliefs     Home Medications  Prior to Admission medications   Medication Sig Start Date End Date Taking? Authorizing Provider  amantadine (SYMMETREL) 50 MG/5ML solution Take by mouth. 03/25/22  Yes [provider]  donepezil (ARICEPT) 5 MG tablet Take 5 mg by mouth daily. 08/13/16  Yes [provider]  insulin glargine (SEMGLEE, YFGN,) 100 UNIT/ML Solostar Pen Inject 35 Units into the skin daily.   Yes [provider]  lansoprazole (PREVACID SOLUTAB) 30 MG disintegrating tablet Take 30 mg by mouth daily.   Yes [provider]  melatonin 3 MG TABS tablet Place 3 mg into feeding tube at bedtime.   Yes [provider]  metFORMIN (GLUCOPHAGE) 1000 MG tablet Take 1,000 mg by mouth 2 (two) times daily. 10/06/21  Yes [provider]  Multiple Vitamin (MULTI-VITAMINS) TABS Take 1 tablet by mouth daily.   Yes [provider]  pantoprazole sodium (PROTONIX) 40 mg Place 40 mg into feeding tube daily. 11/14/21  Yes Lynn Ito, MD  polyethylene glycol (MIRALAX / GLYCOLAX) 17 g packet Place 17 g into feeding tube daily as needed for mild constipation. Patient taking differently: Take 17 g by mouth daily.  11/14/21  Yes Lynn Ito, MD  pravastatin (PRAVACHOL) 40 MG tablet Take 1 tablet (40 mg total) by mouth daily. 07/05/15  Yes Katha Hamming, MD  acetaminophen (TYLENOL) 650 MG suppository Place 1 suppository (650 mg total) rectally every 6 (six) hours as needed for fever or mild pain. 11/14/21   Lynn Ito, MD  amLODipine (NORVASC) 10 MG tablet Take 1 tablet (10 mg total) by mouth daily. Patient not taking: Reported on 05-02-2022 01/01/17   Adrian Saran, MD  dupilumab (DUPIXENT) 300 MG/2ML prefilled syringe Inject 300 mg into the skin every 14 (fourteen) days. Starting at day 15 for maintenance. 05/06/21   Deirdre Evener, MD  guaiFENesin 200 MG tablet Place 200 mg into feeding tube every 8 (eight) hours.    [provider]  insulin aspart (NOVOLOG) 100 UNIT/ML injection Inject 0-9 Units into the skin every 4 (four) hours. Patient not taking: Reported on May 02, 2022 11/14/21   Lynn Ito, MD  insulin detemir (LEVEMIR) 100 UNIT/ML injection Inject 0.09 mLs (9 Units total) into the skin 2 (two) times daily. Patient not taking: Reported on 2022-05-02 11/14/21   Lynn Ito, MD  ipratropium-albuterol (DUONEB) 0.5-2.5 (3) MG/3ML SOLN Take 3 mLs by nebulization every 6 (six) hours. Patient not taking: Reported on 2022-05-02 11/14/21   Lynn Ito, MD  metoprolol succinate (TOPROL-XL) 100 MG 24 hr tablet Take 1 tablet (100 mg total) by mouth daily. Take with or immediately following a meal. Patient not taking: Reported on 02-May-2022 01/01/17   Adrian Saran, MD  Nutritional Supplements (FEEDING SUPPLEMENT, OSMOLITE 1.5 CAL,) LIQD Place 1,000 mLs into feeding tube continuous.    [provider]  oxyCODONE (OXY IR/ROXICODONE) 5 MG immediate release tablet Place 5 mg into feeding tube every 6 (six) hours as needed (pain).    [provider]  sertraline (ZOLOFT) 25 MG tablet Take 25 mg by mouth at bedtime. 10/06/21   [provider]  TRADJENTA 5 MG TABS tablet Take 5 mg by  mouth daily. Patient not taking: Reported on May 02, 2022 10/06/21   [provider]  triamcinolone cream (KENALOG) 0.1 % Apply 1 application topically 2 (two) times daily as needed. 11/05/20   Deirdre Evener, MD     Critical care time: 50 minutes     Harlon Ditty, AGACNP-BC [facility] Prefer epic messenger for cross cover needs If after hours, please call E-link

## 2022-04-11 NOTE — Progress Notes (Signed)
Received patient from ED nurse at [place]; patient is tachypnic and tachycardic on mechanical vent.  Patient is unresponsive and continues on gtts and endo tool, will endorse to oncoming shift.

## 2022-04-11 NOTE — Procedures (Signed)
Arterial Catheter Insertion Procedure Note  Christopher [name]  [id number]  [birth year]  Date:05/06/2022  Time:8:53 PM    Provider Performing: Karen Kays    Procedure: Insertion of Arterial Line [id number]) with US guidance ([id number])   Indication(s) Blood pressure monitoring and/or need for frequent ABGs  Consent Risks of the procedure as well as the alternatives and risks of each were explained to the patient and/or caregiver.  Consent for the procedure was obtained and is signed in the bedside chart  Anesthesia None  Time Out Verified patient identification, verified procedure, site/side was marked, verified correct patient position, special equipment/implants available, medications/allergies/relevant history reviewed, required imaging and test results available.  Sterile Technique Maximal sterile technique including full sterile barrier drape, hand hygiene, sterile gown, sterile gloves, mask, hair covering, sterile ultrasound probe cover (if used).  Procedure Description Area of catheter insertion was cleaned with chlorhexidine and draped in sterile fashion. With real-time ultrasound guidance an arterial catheter was placed into the right femoral artery.  Appropriate arterial tracings confirmed on monitor.    Complications/Tolerance None; patient tolerated the procedure well.  EBL Minimal  Specimen(s) None   [name], DNP, CCRN, FNP-C, AGACNP-BC Acute Care & Family Nurse Practitioner  [name]  See Amion for personal pager PCCM on call pager [phone] until 7 am

## 2022-04-12 ENCOUNTER — Inpatient Hospital Stay: Payer: Medicare Other

## 2022-04-12 ENCOUNTER — Inpatient Hospital Stay (HOSPITAL_COMMUNITY)
Admit: 2022-04-12 | Discharge: 2022-04-12 | Disposition: A | Discharge status: Home / Self Care | Payer: Medicare Other | Attending: Pulmonary Disease | Admitting: Pulmonary Disease

## 2022-04-12 DIAGNOSIS — J9601 Acute respiratory failure with hypoxia: Secondary | ICD-10-CM

## 2022-04-12 DIAGNOSIS — E1100 Type 2 diabetes mellitus with hyperosmolarity without nonketotic hyperglycemic-hyperosmolar coma (NKHHC): Secondary | ICD-10-CM | POA: Diagnosis not present

## 2022-04-12 DIAGNOSIS — R0603 Acute respiratory distress: Secondary | ICD-10-CM

## 2022-04-12 LAB — BASIC METABOLIC PANEL
Anion gap: 9 (ref 5–15)
Anion gap: 7 (ref 5–15)
Anion gap: 2 — ABNORMAL LOW (ref 5–15)
BUN: 76 mg/dL — ABNORMAL HIGH (ref 8–23)
BUN: 72 mg/dL — ABNORMAL HIGH (ref 8–23)
BUN: 63 mg/dL — ABNORMAL HIGH (ref 8–23)
CO2: 25 mmol/L (ref 22–32)
CO2: 24 mmol/L (ref 22–32)
CO2: 27 mmol/L (ref 22–32)
Calcium: 9 mg/dL (ref 8.9–10.3)
Calcium: 8.4 mg/dL — ABNORMAL LOW (ref 8.9–10.3)
Calcium: 8.2 mg/dL — ABNORMAL LOW (ref 8.9–10.3)
Chloride: 127 mmol/L — ABNORMAL HIGH (ref 98–111)
Chloride: 128 mmol/L — ABNORMAL HIGH (ref 98–111)
Chloride: 129 mmol/L — ABNORMAL HIGH (ref 98–111)
Creatinine, Ser: 1.64 mg/dL — ABNORMAL HIGH (ref 0.61–1.24)
Creatinine, Ser: 1.52 mg/dL — ABNORMAL HIGH (ref 0.61–1.24)
Creatinine, Ser: 1.21 mg/dL (ref 0.61–1.24)
GFR, Estimated: 39 mL/min — ABNORMAL LOW (ref >60)
GFR, Estimated: 42 mL/min — ABNORMAL LOW (ref >60)
GFR, Estimated: 55 mL/min — ABNORMAL LOW (ref >60)
Glucose, Bld: 79 mg/dL (ref 70–99)
Glucose, Bld: 178 mg/dL — ABNORMAL HIGH (ref 70–99)
Glucose, Bld: 174 mg/dL — ABNORMAL HIGH (ref 70–99)
Potassium: 3.4 mmol/L — ABNORMAL LOW (ref 3.5–5.1)
Potassium: 3.4 mmol/L — ABNORMAL LOW (ref 3.5–5.1)
Potassium: 3.6 mmol/L (ref 3.5–5.1)
Sodium: 161 mmol/L (ref 135–145)
Sodium: 159 mmol/L — ABNORMAL HIGH (ref 135–145)
Sodium: 158 mmol/L — ABNORMAL HIGH (ref 135–145)

## 2022-04-12 LAB — BLOOD GAS, ARTERIAL
Acid-Base Excess: 1 mmol/L (ref 0.0–2.0)
Bicarbonate: 27.1 mmol/L (ref 20.0–28.0)
FIO2: 50 %
MECHVT: 420 mL
Mechanical Rate: 16
O2 Saturation: 99.8 %
PEEP: 5 cmH2O
Patient temperature: 37
pCO2 arterial: 48 mmHg (ref 32–48)
pH, Arterial: 7.36 (ref 7.35–7.45)
pO2, Arterial: 208 mmHg — ABNORMAL HIGH (ref 83–108)

## 2022-04-12 LAB — HEMOGLOBIN A1C
Hgb A1c MFr Bld: 9 % — ABNORMAL HIGH (ref 4.8–5.6)
Mean Plasma Glucose: 211.6 mg/dL

## 2022-04-12 LAB — COMPREHENSIVE METABOLIC PANEL
ALT: 14 U/L (ref 0–44)
AST: 28 U/L (ref 15–41)
Albumin: 2 g/dL — ABNORMAL LOW (ref 3.5–5.0)
Alkaline Phosphatase: 129 U/L — ABNORMAL HIGH (ref 38–126)
Anion gap: 3 — ABNORMAL LOW (ref 5–15)
BUN: 67 mg/dL — ABNORMAL HIGH (ref 8–23)
CO2: 25 mmol/L (ref 22–32)
Calcium: 8.4 mg/dL — ABNORMAL LOW (ref 8.9–10.3)
Chloride: 129 mmol/L — ABNORMAL HIGH (ref 98–111)
Creatinine, Ser: 1.45 mg/dL — ABNORMAL HIGH (ref 0.61–1.24)
GFR, Estimated: 45 mL/min — ABNORMAL LOW (ref >60)
Glucose, Bld: 250 mg/dL — ABNORMAL HIGH (ref 70–99)
Potassium: 3.5 mmol/L (ref 3.5–5.1)
Sodium: 157 mmol/L — ABNORMAL HIGH (ref 135–145)
Total Bilirubin: 0.5 mg/dL (ref 0.3–1.2)
Total Protein: 6.1 g/dL — ABNORMAL LOW (ref 6.5–8.1)

## 2022-04-12 LAB — HEPARIN LEVEL (UNFRACTIONATED): Heparin Unfractionated: 0.85 IU/mL — ABNORMAL HIGH (ref 0.30–0.70)

## 2022-04-12 LAB — CBC
HCT: 40.5 % (ref 39.0–52.0)
Hemoglobin: 12 g/dL — ABNORMAL LOW (ref 13.0–17.0)
MCH: 26.1 pg (ref 26.0–34.0)
MCHC: 29.6 g/dL — ABNORMAL LOW (ref 30.0–36.0)
MCV: 88.2 fL (ref 80.0–100.0)
Platelets: 211 10*3/uL (ref 150–400)
RBC: 4.59 MIL/uL (ref 4.22–5.81)
RDW: 16.5 % — ABNORMAL HIGH (ref 11.5–15.5)
WBC: 20.8 10*3/uL — ABNORMAL HIGH (ref 4.0–10.5)
nRBC: 0.1 % (ref 0.0–0.2)

## 2022-04-12 LAB — C-REACTIVE PROTEIN
CRP: 3.5 mg/dL — ABNORMAL HIGH (ref <1.0)
CRP: 4.8 mg/dL — ABNORMAL HIGH (ref <1.0)

## 2022-04-12 LAB — GLUCOSE, CAPILLARY
Glucose-Capillary: 106 mg/dL — ABNORMAL HIGH (ref 70–99)
Glucose-Capillary: 129 mg/dL — ABNORMAL HIGH (ref 70–99)
Glucose-Capillary: 140 mg/dL — ABNORMAL HIGH (ref 70–99)
Glucose-Capillary: 176 mg/dL — ABNORMAL HIGH (ref 70–99)
Glucose-Capillary: 193 mg/dL — ABNORMAL HIGH (ref 70–99)
Glucose-Capillary: 193 mg/dL — ABNORMAL HIGH (ref 70–99)
Glucose-Capillary: 193 mg/dL — ABNORMAL HIGH (ref 70–99)
Glucose-Capillary: 203 mg/dL — ABNORMAL HIGH (ref 70–99)
Glucose-Capillary: 51 mg/dL — ABNORMAL LOW (ref 70–99)

## 2022-04-12 LAB — PROCALCITONIN: Procalcitonin: 0.65 ng/mL

## 2022-04-12 LAB — ECHOCARDIOGRAM COMPLETE
AR max vel: 0.78 cm2
AV Area VTI: 0.86 cm2
AV Area mean vel: 0.76 cm2
AV Mean grad: 9.3 mmHg
AV Peak grad: 17.1 mmHg
Ao pk vel: 2.07 m/s
Area-P 1/2: 5.27 cm2
Calc EF: 33.7 %
Height: 62 in
MV VTI: 1.12 cm2
P 1/2 time: 512 msec
S' Lateral: 2.3 cm
Single Plane A2C EF: 32.7 %
Single Plane A4C EF: 35.2 %
Weight: 1932.99 oz

## 2022-04-12 LAB — FERRITIN
Ferritin: 62 ng/mL (ref 24–336)
Ferritin: 66 ng/mL (ref 24–336)

## 2022-04-12 LAB — D-DIMER, QUANTITATIVE: D-Dimer, Quant: 1.22 ug/mL-FEU — ABNORMAL HIGH (ref 0.00–0.50)

## 2022-04-12 LAB — STREP PNEUMONIAE URINARY ANTIGEN: Strep Pneumo Urinary Antigen: NEGATIVE

## 2022-04-12 LAB — VANCOMYCIN, RANDOM: Vancomycin Rm: 9 ug/mL

## 2022-04-12 MED ORDER — STERILE WATER FOR INJECTION IJ SOLN
INTRAMUSCULAR | Status: AC
  Filled 2022-04-12: qty 10

## 2022-04-12 MED ORDER — NOREPINEPHRINE 16 MG/250ML-% IV SOLN
0.0000 ug/min | INTRAVENOUS | Status: DC
  Administered 2022-04-12: 2 ug/min via INTRAVENOUS
  Filled 2022-04-12 (×2): qty 250

## 2022-04-12 MED ORDER — INSULIN GLARGINE-YFGN 100 UNIT/ML ~~LOC~~ SOLN
10.0000 [IU] | Freq: Every day | SUBCUTANEOUS | Status: DC
  Administered 2022-04-12: 10 [IU] via SUBCUTANEOUS
  Filled 2022-04-12: qty 0.1

## 2022-04-12 MED ORDER — VASOPRESSIN 20 UNITS/100 ML INFUSION FOR SHOCK: 0.0000 [IU]/min | INTRAVENOUS | Status: DC

## 2022-04-12 MED ORDER — VANCOMYCIN HCL IN DEXTROSE 1-5 GM/200ML-% IV SOLN
1000.0000 mg | Freq: Once | INTRAVENOUS | Status: DC
  Filled 2022-04-12: qty 200

## 2022-04-12 MED ORDER — INSULIN ASPART 100 UNIT/ML IJ SOLN
0.0000 [IU] | INTRAMUSCULAR | Status: DC
  Administered 2022-04-12: 2 [IU] via SUBCUTANEOUS
  Administered 2022-04-12: 3 [IU] via SUBCUTANEOUS
  Filled 2022-04-12 (×2): qty 1

## 2022-04-12 MED ORDER — HEPARIN BOLUS VIA INFUSION
3500.0000 [IU] | Freq: Once | INTRAVENOUS | Status: AC
  Administered 2022-04-12: 3500 [IU] via INTRAVENOUS
  Filled 2022-04-12: qty 3500

## 2022-04-12 MED ORDER — LACTATED RINGERS IV SOLN: INTRAVENOUS | Status: DC

## 2022-04-12 MED ORDER — HEPARIN (PORCINE) 25000 UT/250ML-% IV SOLN
600.0000 [IU]/h | INTRAVENOUS | Status: DC
  Administered 2022-04-12: 750 [IU]/h via INTRAVENOUS
  Filled 2022-04-12: qty 250

## 2022-04-12 MED ORDER — POTASSIUM CHLORIDE 20 MEQ PO PACK
40.0000 meq | PACK | Freq: Once | ORAL | Status: AC
  Administered 2022-04-12: 40 meq
  Filled 2022-04-12: qty 2

## 2022-04-12 NOTE — Progress Notes (Signed)
Dr. Mortimer Fries at bedside and discussed patient's status and gave updates to family.

## 2022-04-12 NOTE — Plan of Care (Signed)
Continuing with plan of care. 

## 2022-04-12 NOTE — IPAL (Signed)
  Interdisciplinary Goals of Care Family Meeting   Date carried out: 04/12/2022  Location of the meeting: Bedside  Member's involved: Physician, Bedside Registered Nurse, and Family Member or next of kin     GOALS OF CARE DISCUSSION  The Clinical status was relayed to family in detail- Son Christopher [name]  Updated and notified of patients medical condition- Patient remains unresponsive and will not open eyes to command.   Patient is having a weak cough and struggling to remove secretions.   Patient with increased WOB and using accessory muscles to breathe Explained to family course of therapy and the modalities   Patient with Progressive multiorgan failure with a very high probablity of a very minimal chance of meaningful recovery despite all aggressive and optimal medical therapy.  PATIENT REMAINS DNR  Family understands the situation.  PATIENT WITH SEVERE INFECTION SEVERE ORGAN FAILURE PATIENT HAS BLUE HANDS AND TOES PATIENT IS IN THE DYING PROCESS  Family are satisfied with Plan of action and management. All questions answered  Additional CC time 35 mins   Roran Wegner Patricia Pesa, M.D.  [facility]  Medical Director [place] Director [facility] Cardio-Pulmonary Department

## 2022-04-12 NOTE — Progress Notes (Signed)
ANTICOAGULATION CONSULT NOTE  Pharmacy Consult for heparin infusion Indication: ACS/STEMI  Allergies  Allergen Reactions   Hydrochlorothiazide     Unknown reaction   Beef-Derived Products Other (See Comments)    Does not eat due to religious beliefs   Hydrochlorothiazide Other (See Comments)    Unknown reaction   Penicillins Other (See Comments)    Unknown reaction   Pork-Derived Products Other (See Comments)    Does not eat due to religious beliefs    Patient Measurements: Height: 5\' 2"  (157.5 cm) IBW/kg (Calculated) : 54.6 Heparin Dosing Weight: 57.9 kg  Vital Signs: Temp: 99.7 F (37.6 C) (10/07 0030) Temp Source: Bladder (10/07 0000) BP: 93/61 (10/07 0030) Pulse Rate: 108 (10/07 0030)  Labs: Recent Labs    04/18/2022 1346 05/03/2022 2114  HGB 14.0  --   HCT 47.1  --   PLT 264  --   APTT 33  --   LABPROT 17.1*  --   INR 1.4*  --   CREATININE 2.06* 1.64*  TROPONINIHS 91* 3,965*    Estimated Creatinine Clearance: 21.3 mL/min (A) (by C-G formula based on SCr of 1.64 mg/dL (H)).   Medical History: Past Medical History:  Diagnosis Date   Arthritis    Ataxia    Diabetes mellitus without complication ([place])    controlled, pt checks it in the morning    Dyslipidemia    Hypertension    somewhat controlled, pt checks every morning, he reports it has been good.   Stroke [facility])    TIA (transient ischemic attack)     Assessment: Pt is a 88 yo male presenting to ED w/ AMS, now found with elevated troponin I, trending up.  Provider notified pt with "Allergy" to beef & pork for religious reasons and advised Heparin (and Lovenox) is porcine based.  Per NP, pt is intubated but okay to proceed with heparin drip per pt's son.  Goal of Therapy:  Heparin level 0.3-0.7 units/ml Monitor platelets by anticoagulation protocol: Yes   Plan:  Bolus 3500 units x 1 Start heparin infusion at 750 units/hr Will check HL in 8 hr after start of infusion CBC daily while on  heparin  Renda Rolls, PharmD, [facility] 04/12/2022 12:49 AM

## 2022-04-12 NOTE — Progress Notes (Signed)
At 1543 patient's CBG is 51, dextrose amp given.

## 2022-04-12 NOTE — Progress Notes (Signed)
Per Dr. Mortimer Fries, BMP and heparin level not to be drawn at this time due to patients current status.

## 2022-04-12 NOTE — Inpatient Diabetes Management (Signed)
Inpatient Diabetes Program Recommendations  AACE/ADA: New Consensus Statement on Inpatient Glycemic Control  Target Ranges:  Prepandial:   less than 140 mg/dL      Peak postprandial:   less than 180 mg/dL (1-2 hours)      Critically ill patients:  140 - 180 mg/dL    Latest Reference Range & Units 04/12/22 05:02 04/12/22 05:57 04/12/22 07:57  Glucose-Capillary 70 - 99 mg/dL 193 (H) 193 (H) 203 (H)    Latest Reference Range & Units 04/12/22 00:57 04/12/22 02:01 04/12/22 03:06 04/12/22 04:16 04/12/22 05:02 04/12/22 05:57  Glucose-Capillary 70 - 99 mg/dL 140 (H) 129 (H) 193 (H) 176 (H) 193 (H) 193 (H)    Latest Reference Range & Units 04/12/2022 13:46  CO2 22 - 32 mmol/L 28  Glucose 70 - 99 mg/dL 868 (HH)  Anion gap 5 - 15  7   Review of Glycemic Control  Diabetes history: DM2 Outpatient Diabetes medications: Semglee 36 units daily, Metformin 1000 mg BID, Tradjenta 5 mg daily, Jardiance 10 mg daily Current orders for Inpatient glycemic control: Novolog 0-9 units Q4H, Semglee 10 units daily  Inpatient Diabetes Program Recommendations:    Insulin: Patient was ordered IV insulin and received Semglee 10 units at 3:59 am today. Please consider increasing Semglee to 14 units daily (also order Semglee 4 units x1 now for total of 14 units today). If tube feedings are started at some point, will likely need Novolog tube feeding coverage as well.  NOTE: Noted consult for Diabetes Coordinator. Diabetes Coordinator is not on campus over the weekend but available by pager from 8am to 5pm for questions or concerns. Patient admitted from SNF due to displaced G-tube. Patient admitted with HHS, respiratory failure with hypoxia, alerted mental status, hypotension, sepsis, AKI, and COVID. Initial lab glucose 868 mg/dl on 04/26/2022 and patient was started on IV insulin. Patient was transitioned off IV insulin to SQ insulin during the night and received Semglee 10 units at 3:59 am today.    Thanks, Barnie Alderman,  RN, MSN, [place] Diabetes Coordinator Inpatient Diabetes Program [phone] (Team Pager from 8am to [place])

## 2022-04-12 NOTE — Progress Notes (Signed)
ANTICOAGULATION CONSULT NOTE  Pharmacy Consult for heparin infusion Indication: ACS/STEMI  Allergies  Allergen Reactions   Hydrochlorothiazide     Unknown reaction   Beef-Derived Products Other (See Comments)    Does not eat due to religious beliefs   Hydrochlorothiazide Other (See Comments)    Unknown reaction   Penicillins Other (See Comments)    Unknown reaction   Pork-Derived Products Other (See Comments)    Does not eat due to religious beliefs    Patient Measurements: Height: 5\' 2"  (157.5 cm) Weight: 54.8 kg (120 lb 13 oz) IBW/kg (Calculated) : 54.6 Heparin Dosing Weight: 57.9 kg  Vital Signs: Temp: 98.8 F (37.1 C) (10/07 1030) Temp Source: Bladder (10/07 1030) BP: 104/28 (10/07 1030) Pulse Rate: 91 (10/07 1030)  Labs: Recent Labs    04-28-2022 1346 28-Apr-2022 2114 04/12/22 0107 04/12/22 0437 04/12/22 0952  HGB 14.0  --   --  12.0*  --   HCT 47.1  --   --  40.5  --   PLT 264  --   --  211  --   APTT 33  --   --   --   --   LABPROT 17.1*  --   --   --   --   INR 1.4*  --   --   --   --   HEPARINUNFRC  --   --   --   --  0.85*  CREATININE 2.06* 1.64* 1.52* 1.45*  --   TROPONINIHS 91* 3,965*  --   --   --      Estimated Creatinine Clearance: 24.1 mL/min (A) (by C-G formula based on SCr of 1.45 mg/dL (H)).   Medical History: Past Medical History:  Diagnosis Date   Arthritis    Ataxia    Diabetes mellitus without complication ([place])    controlled, pt checks it in the morning    Dyslipidemia    Hypertension    somewhat controlled, pt checks every morning, he reports it has been good.   Stroke [facility])    TIA (transient ischemic attack)     Assessment: Pt is a 88 yo male presenting to ED w/ AMS, now found with elevated troponin I, trending up.  Provider notified pt with "Allergy" to beef & pork for religious reasons and advised Heparin (and Lovenox) is porcine based.  Per NP, pt is intubated but okay to proceed with heparin drip per pt's son.  10/7  0952 HL 0.85   Goal of Therapy:  Heparin level 0.3-0.7 units/ml Monitor platelets by anticoagulation protocol: Yes   Plan:  Heparin level is supratherapeutic Will decrease heparin infusion to 600 units/hr. Recheck heparin level in 8 hours. CBC daily while on heparin.    Eleonore Chiquito, PharmD, BCPS 04/12/2022 10:42 AM

## 2022-04-12 NOTE — Progress Notes (Signed)
At 1545 patients oxygen saturation sustained at 79%, RT notified and RT increased oxygen to 100%.

## 2022-04-12 NOTE — Progress Notes (Signed)
*  PRELIMINARY RESULTS* Echocardiogram 2D Echocardiogram has been performed.  Christopher [name] 04/12/2022, 9:16 AM

## 2022-04-12 NOTE — Consult Note (Signed)
Pharmacy Antibiotic Note  Christopher [name] is a 88 y.o. male admitted on 04-14-22 with sepsis.  Pharmacy has been consulted for cefepime and vancomycin dosing.  -Presented w/ AMS and fever from SNF. Patient is COVID+.  -AKI (Baseline SCr: 0.6-0.8)  -CXR: streaky bibasilar lung opacities   Vanc random resulted 9 ug/mL 10/7 @1530    Plan: Give additional vancomycin 1000 mg  -Next vanc level in 24 hours -Obtain MRSA screen -Daily Scr  -Cefepime 2 grams IV Q24h  Height: 5\' 2"  (157.5 cm) Weight: 54.8 kg (120 lb 13 oz) IBW/kg (Calculated) : 54.6  Temp (24hrs), Avg:99.8 F (37.7 C), Min:97 F (36.1 C), Max:103.6 F (39.8 C)  Recent Labs  Lab 04-14-2022 1346 04-14-2022 2114 04/12/22 0107 04/12/22 0437 04/12/22 0952 04/12/22 1530  WBC 12.8*  --   --  20.8*  --   --   CREATININE 2.06* 1.64* 1.52* 1.45* 1.21  --   LATICACIDVEN 3.8* 2.3*  --   --   --   --   VANCORANDOM  --   --   --   --   --  9     Estimated Creatinine Clearance: 28.8 mL/min (by C-G formula based on SCr of 1.21 mg/dL).    Allergies  Allergen Reactions   Hydrochlorothiazide     Unknown reaction   Beef-Derived Products Other (See Comments)    Does not eat due to religious beliefs   Hydrochlorothiazide Other (See Comments)    Unknown reaction   Penicillins Other (See Comments)    Unknown reaction   Pork-Derived Products Other (See Comments)    Does not eat due to religious beliefs    Antimicrobials this admission: cefepime 10/6 >>  vancomycin 10/6 >>   Dose adjustments this admission: N/A  Microbiology results: 10/6 BCx: pending 10/6 UCx: pending  10/6 Sputum: pending  10/6 MRSA PCR: pending  Thank you for allowing pharmacy to be a part of this patient's care.  Wynelle Cleveland 04/12/2022 5:39 PM

## 2022-04-12 NOTE — Progress Notes (Addendum)
NAME:  Christopher [name], MRN:  [id number], DOB:  06-24-1926, LOS: 1 ADMISSION DATE:  04/19/2022, CONSULTATION DATE:   04/16/2022 REFERRING MD:  Dr. Marisa Severin, CHIEF COMPLAINT:  Altered Mental Status, hypotension, respiratory distress     BRIEF HPI  88 year old male with past medical history most significant for intracerebral hemorrhage and stroke admitted with acute metabolic encephalopathy, multifactorial shock (hypovolemic +/- septic), HHS, hypernatremia, Acute Hypoxic Respiratory Failure due to COVID infection and suspected superimposed bacterial/aspiration pneumonia, and AKI.  Required intubation and mechanical ventilation in the ED (See HPI for full details of presentation).   ED Course: Initial vital signs showed HR of 125 beats/minute, BP 116/80 mm Hg, the RR 30 breaths/minute, and the oxygen saturation 96% on and a temperature of 98.6F. On exam he was noted to be febrile, tachycardic, and tachypneic with respiratory rate as high as 60.Patient had previously been DNR but was intubated several months ago.  Given his respiratory status, ER physician discussed with patient's son who wished to pursue aggressive care and have everything done just short of CPR.  Patient was intubated in the ED. PCCM consulted for admission  Pertinent Labs/Diagnostics Findings: Sodium 152, chloride 117, glucose 868, bicarb 28, BUN 89, creatinine 2.06, alkaline phosphatase 180, albumin 2.3, BNP 110, high-sensitivity troponin 91, lactic acid 3.8, WBC 12.8 with neutrophilia, INR 1.4, PT 17.1 COVID-19 PCR is positive Urinalysis not concerning for UTI Urine drug screen negative Medications Administered: 2 L LR boluses, IV cefepime and vancomycin, insulin drip initiated  Past Medical History    Arthritis     Ataxia     Diabetes mellitus without complication (HCC)      controlled, pt checks it in the morning    Dyslipidemia     Hypertension      somewhat controlled, pt checks every morning, he reports it has been  good.   Stroke [facility])     TIA (transient ischemic attack)    Significant Hospital Events   10/6: Presented to ED, required intubation in the ED.  PCCM asked to admit. 10/7:Started on Heparin gtt for NSTEMI troponin elevated from 91 to 3965  Interim History / Subjective:  -Remains intubated and sedated -Overnight with increased pressor requirement (Neo and Vasopressin) -Urine output improved, creatine slightly improved this morning -Troponin increased, plan to consult cardiology -Sodium increased to 161 from 152, repeat 159 -Spiked a temp with tmax >39  Procedures:  10/6> Intubation 10/6: Right femoral Art Line 10/6: Right IJ central line  Significant Diagnostic Tests:  Chest X-ray>>FINDINGS: Endotracheal tube tip is 2.5 cm above the carina. Stable cardiomediastinal silhouette with normal heart size and prominence of the aortic arch contour. No pneumothorax. Chronic slight blunting of the right costophrenic angle. No left pleural effusion. No overt pulmonary edema. Low lung volumes with streaky bibasilar lung opacities. CT head without contrast>>IMPRESSION: 1. No acute intracranial abnormality. 2. Generalized cerebral atrophy with widening of the extra-axial spaces and ventricular dilatation. 3. Right frontoparietal craniotomy defect. 4. Left maxillary sinus, bilateral ethmoid sinus and sphenoid sinus disease.  Micro Data:  10/6: COVID-19 PCR>> positive 10/6: Influenza PCR>>Negative 10/6: Blood culture x2>> 10/6: Urine>> 10/6: MRSA>positive 10/6: Tracheal aspirate>> 10/6: Strep pneumo urinary antigen>> 10/6: Legionella urinary antigen>>  Antimicrobials:  Cefepime 10/6>> Vancomycin 10/6>>  OBJECTIVE  Blood pressure 103/63, pulse 90, temperature (!) 97.2 F (36.2 C), resp. rate 17, height 5\' 2"  (1.575 m), weight 54.8 kg, SpO2 97 %.    Vent Mode: PRVC FiO2 (%):  [30 %-50 %] 50 %  Set Rate:  [16 bmp] 16 bmp Vt Set:  [420 mL] 420 mL PEEP:  [5 cmH20] 5 cmH20 Plateau  Pressure:  [29 cmH20] 29 cmH20   Intake/Output Summary (Last 24 hours) at 04/12/2022 0330 Last data filed at 04/12/2022 0000 Gross per 24 hour  Intake 3244.77 ml  Output 500 ml  Net 2744.77 ml   Filed Weights   04/12/22 0300  Weight: 54.8 kg   Physical Examination  GENERAL: 88 year-old critically ill patient lying in the bed INTUBATED AND SEDATED EYES: Pupils equal, round, reactive to light and accommodation. No scleral icterus. Extraocular muscles intact.  HEENT: Head atraumatic, normocephalic. Oropharynx and nasopharynx clear.  NECK:  Supple, no jugular venous distention. No thyroid enlargement, no tenderness.  LUNGS: Decreased breath sounds bilaterally, no wheezing, rales,rhonchi or crepitation. Mild use of accessory muscles of respiration.  CARDIOVASCULAR: S1, S2 normal. No murmurs, rubs, or gallops.  ABDOMEN: Soft, nontender, nondistended. Bowel sounds present. No organomegaly or mass.  EXTREMITIES: No pedal edema, cyanosis, or clubbing.  NEUROLOGIC: Cranial nerves II through XII are intact.  Sensation intact. Gait not checked.  PSYCHIATRIC: The patient is INTUBATED AND SEDATED SKIN: No obvious rash, lesion, or ulcer.   Labs/imaging that I havepersonally reviewed  (right click and "Reselect all SmartList Selections" daily)     Labs   CBC: Recent Labs  Lab 04-18-22 1346  WBC 12.8*  NEUTROABS 10.2*  HGB 14.0  HCT 47.1  MCV 88.5  PLT 264    Basic Metabolic Panel: Recent Labs  Lab 04/18/22 1346 04/18/22 2114 04/12/22 0107  NA 152* 161* 159*  K 4.8 3.4* 3.4*  CL 117* 127* 128*  CO2 28 25 24   GLUCOSE 868* 79 178*  BUN 89* 76* 72*  CREATININE 2.06* 1.64* 1.52*  CALCIUM 8.9 9.0 8.4*   GFR: Estimated Creatinine Clearance: 22.9 mL/min (A) (by C-G formula based on SCr of 1.52 mg/dL (H)). Recent Labs  Lab 04/18/22 1345 2022-04-18 1346 Apr 18, 2022 2114  PROCALCITON 0.30  --   --   WBC  --  12.8*  --   LATICACIDVEN  --  3.8* 2.3*    Liver Function Tests: Recent  Labs  Lab 04/18/22 1346  AST 30  ALT 16  ALKPHOS 180*  BILITOT 0.6  PROT 7.4  ALBUMIN 2.3*   No results for input(s): "LIPASE", "AMYLASE" in the last 168 hours. No results for input(s): "AMMONIA" in the last 168 hours.  ABG    Component Value Date/Time   PHART 7.37 Apr 18, 2022 1421   PCO2ART 42 04-18-2022 1421   PO2ART 159 (H) 18-Apr-2022 1421   HCO3 24.3 April 18, 2022 1421   TCO2 25 10/15/2016 0928   ACIDBASEDEF 1.1 April 18, 2022 1421   O2SAT 100 04/18/22 1421     Coagulation Profile: Recent Labs  Lab 04/18/2022 1346  INR 1.4*    Cardiac Enzymes: No results for input(s): "CKTOTAL", "CKMB", "CKMBINDEX", "TROPONINI" in the last 168 hours.  HbA1C: Hgb A1c MFr Bld  Date/Time Value Ref Range Status  10/25/2021 11:40 AM 7.6 (H) 4.8 - 5.6 % Final    Comment:    (NOTE) Pre diabetes:          5.7%-6.4%  Diabetes:              >6.4%  Glycemic control for   <7.0% adults with diabetes   10/16/2016 02:06 AM 8.7 (H) 4.8 - 5.6 % Final    Comment:    (NOTE)         Pre-diabetes: 5.7 -  6.4         Diabetes: >6.4         Glycemic control for adults with diabetes: <7.0     CBG: Recent Labs  Lab December 15, 2021 2256 December 15, 2021 2352 04/12/22 0057 04/12/22 0201 04/12/22 0306  GLUCAP 113* 128* 140* 129* 193*    Review of Systems:   UNABLE TO OBTAIN PATIENT IS INTUBATED AND SEDATED  Past Medical History  He,  has a past medical history of Arthritis, Ataxia, Diabetes mellitus without complication (HCC), Dyslipidemia, Hypertension, Stroke (HCC), and TIA (transient ischemic attack).   Surgical History    Past Surgical History:  Procedure Laterality Date   CRANIOTOMY Right 11/02/2021   Procedure: CRANIOTOMY HEMATOMA EVACUATION SUBDURAL;  Surgeon: Lucy Chrisook, Steven, MD;  Location: [facility] ORS;  Service: Neurosurgery;  Laterality: Right;   IR GASTROSTOMY TUBE MOD SED  11/11/2021   IR REPLACE G-TUBE SIMPLE WO FLUORO  01/15/2022   none     TRACHEOSTOMY TUBE PLACEMENT  11/02/2021   Procedure:  TRACHEOSTOMY;  Surgeon: Lucy Chrisook, Steven, MD;  Location: [facility] ORS;  Service: Neurosurgery;;     Social History   reports that he has never smoked. He has never used smokeless tobacco. He reports that he does not drink alcohol and does not use drugs.   Family History   His family history is negative for CAD and Hypertension.   Allergies Allergies  Allergen Reactions   Hydrochlorothiazide     Unknown reaction   Beef-Derived Products Other (See Comments)    Does not eat due to religious beliefs   Hydrochlorothiazide Other (See Comments)    Unknown reaction   Penicillins Other (See Comments)    Unknown reaction   Pork-Derived Products Other (See Comments)    Does not eat due to religious beliefs     Home Medications  Prior to Admission medications   Medication Sig Start Date End Date Taking? Authorizing Provider  amantadine (SYMMETREL) 50 MG/5ML solution Take by mouth. 03/25/22  Yes [provider]  donepezil (ARICEPT) 5 MG tablet Take 5 mg by mouth daily. 08/13/16  Yes [provider]  insulin glargine (SEMGLEE, YFGN,) 100 UNIT/ML Solostar Pen Inject 35 Units into the skin daily.   Yes [provider]  lansoprazole (PREVACID SOLUTAB) 30 MG disintegrating tablet Take 30 mg by mouth daily.   Yes [provider]  melatonin 3 MG TABS tablet Place 3 mg into feeding tube at bedtime.   Yes [provider]  metFORMIN (GLUCOPHAGE) 1000 MG tablet Take 1,000 mg by mouth 2 (two) times daily. 10/06/21  Yes [provider]  Multiple Vitamin (MULTI-VITAMINS) TABS Take 1 tablet by mouth daily.   Yes [provider]  pantoprazole sodium (PROTONIX) 40 mg Place 40 mg into feeding tube daily. 11/14/21  Yes Lynn ItoAmery, Sahar, MD  polyethylene glycol (MIRALAX / GLYCOLAX) 17 g packet Place 17 g into feeding tube daily as needed for mild constipation. Patient taking differently: Take 17 g by mouth daily. 11/14/21  Yes Lynn ItoAmery, Sahar, MD  pravastatin (PRAVACHOL)  40 MG tablet Take 1 tablet (40 mg total) by mouth daily. 07/05/15  Yes Katha HammingKonidena, Snehalatha, MD  acetaminophen (TYLENOL) 650 MG suppository Place 1 suppository (650 mg total) rectally every 6 (six) hours as needed for fever or mild pain. 11/14/21   Lynn ItoAmery, Sahar, MD  amLODipine (NORVASC) 10 MG tablet Take 1 tablet (10 mg total) by mouth daily. Patient not taking: Reported on 04/25/2022 01/01/17   Adrian SaranMody, Sital, MD  dupilumab (DUPIXENT) 300 MG/2ML  prefilled syringe Inject 300 mg into the skin every 14 (fourteen) days. Starting at day 15 for maintenance. 05/06/21   Ralene Bathe, MD  guaiFENesin 200 MG tablet Place 200 mg into feeding tube every 8 (eight) hours.    [provider]  insulin aspart (NOVOLOG) 100 UNIT/ML injection Inject 0-9 Units into the skin every 4 (four) hours. Patient not taking: Reported on April 27, 2022 11/14/21   Nolberto Hanlon, MD  insulin detemir (LEVEMIR) 100 UNIT/ML injection Inject 0.09 mLs (9 Units total) into the skin 2 (two) times daily. Patient not taking: Reported on April 27, 2022 11/14/21   Nolberto Hanlon, MD  ipratropium-albuterol (DUONEB) 0.5-2.5 (3) MG/3ML SOLN Take 3 mLs by nebulization every 6 (six) hours. Patient not taking: Reported on 2022/04/27 11/14/21   Nolberto Hanlon, MD  metoprolol succinate (TOPROL-XL) 100 MG 24 hr tablet Take 1 tablet (100 mg total) by mouth daily. Take with or immediately following a meal. Patient not taking: Reported on 2022/04/27 01/01/17   Bettey Costa, MD  Nutritional Supplements (FEEDING SUPPLEMENT, OSMOLITE 1.5 CAL,) LIQD Place 1,000 mLs into feeding tube continuous.    [provider]  oxyCODONE (OXY IR/ROXICODONE) 5 MG immediate release tablet Place 5 mg into feeding tube every 6 (six) hours as needed (pain).    [provider]  sertraline (ZOLOFT) 25 MG tablet Take 25 mg by mouth at bedtime. 10/06/21   [provider]  TRADJENTA 5 MG TABS tablet Take 5 mg by mouth daily. Patient not taking: Reported on 2022/04/27  10/06/21   [provider]  triamcinolone cream (KENALOG) 0.1 % Apply 1 application topically 2 (two) times daily as needed. 11/05/20   Ralene Bathe, MD  Scheduled Meds:  Chlorhexidine Gluconate Cloth  6 each Topical Daily   docusate  100 mg Per Tube BID   insulin aspart  0-9 Units Subcutaneous Q4H   insulin glargine-yfgn  10 Units Subcutaneous Daily   mouth rinse  15 mL Mouth Rinse Q2H   pantoprazole (PROTONIX) IV  40 mg Intravenous QHS   polyethylene glycol  17 g Per Tube Daily   vancomycin variable dose per unstable renal function (pharmacist dosing)   Does not apply See admin instructions   Continuous Infusions:  sodium chloride 250 mL (04-27-2022 1934)   ceFEPime (MAXIPIME) IV     dextrose 5% lactated ringers 125 mL/hr at 04/12/22 0000   fentaNYL infusion INTRAVENOUS 75 mcg/hr (04/12/22 0000)   heparin 750 Units/hr (04/12/22 0101)   insulin 1 Units/hr (04/12/22 0000)   lactated ringers     midazolam 5 mg/hr (04/12/22 0000)   phenylephrine (NEO-SYNEPHRINE) Adult infusion 275 mcg/min (04/12/22 0000)   vasopressin 0.03 Units/min (04/12/22 0000)   PRN Meds:.acetaminophen, dextrose, docusate, fentaNYL, midazolam, mouth rinse, polyethylene glycol, vecuronium    Active Hospital Problem list     Assessment & Plan:  Hypotension: Hypovolemic in setting of HHS +/- ? Septic with COVID and pneumonia Tachycardia in setting of sepsis and fever Elevated Troponin, suspect demand ischemia vs NSTEMI PMHx: HTN -Continuous cardiac monitoring -Maintain MAP >65 -IV fluids -Vasopressors as needed to maintain MAP goal -Trend lactic acid until normalized -Trend HS Troponin until peaked -Obtain Echocardiogram  -Start Heparin gtt per ACS protocol   Severe Sepsis in setting of COVID-19 Infection, ? Superimposed bacterial/aspiration pneumonia -Monitor fever curve -Trend WBC's, Procalcitonin, & inflammatory markers -Follow cultures as above -Continue empiric Cefepime & Vancomycin  pending cultures & sensitivities   Hyperosmolar Hyperglycemic State (HHS), likely precipitated by COVID infection ~IMPROVED PMHx: Diabetes  Mellitus -Check Hgb A1c -BMP q4h -Aggressive IVF -Off Insulin gtt,  -Transition to SSI and basal insulinTarget range of 140 to 180 -Follow ICU Hypo/Hyperglycemia protocol -Consult Diabetes Coordinator   Acute Kidney Injury~IMPROVING Hypernatremia in setting of dehydration with HHS Hyperchloremia -Monitor I&O's / urinary output -Follow BMP -Ensure adequate renal perfusion -Avoid nephrotoxic agents as able -Replace electrolytes as indicated -IV Fluids   Acute Metabolic Encephalopathy in setting of HHS, Hypernatremia, & AKI Sedation needs in setting of mechanical ventilation PMHx: ICH, CVA CT head on admission negative for acute intracranial abnormality Urine drug screen negative -Treatment of metabolic derangements as outlined above -Maintain a RASS goal of 0 to -1 -Fentanyl and Versed as needed to maintain RASS goal -Avoid sedating medications as able -Daily wake up assessment     Best practice:  Diet:  NPO Pain/Anxiety/Delirium protocol (if indicated): Yes (RASS goal 0) VAP protocol (if indicated): Yes DVT prophylaxis: Systemic AC GI prophylaxis: PPI Glucose control:  SSI Yes Central venous access:  Yes, and it is still needed Arterial line:  Yes, and it is still needed Foley:  Yes, and it is still needed Mobility:  bed rest  PT consulted: N/A Last date of multidisciplinary goals of care discussion [10/6] Code Status:  limited Disposition: ICU   = Goals of Care = Code Status Order: LIMITED  Primary Emergency Contact: Uy,Saif, Home Phone: [phone] Patient wishes to pursue ongoing treatment with relatively conservative measures (e.g., IV fluids, antibiotics), but would not wish to escalate to ICU level of care or other invasive measures.   Critical care time: 35 minutes       Webb Silversmith, DNP, CCRN, FNP-C,  AGACNP-BC Acute Care Nurse Practitioner [facility]  PCCM on call pager [phone] until 7 am

## 2022-04-12 NOTE — Progress Notes (Signed)
Per Dr. Mortimer Fries to discontinue all labs, discontinue heparin gtt due to risk for bleeding, discontinue blood sugar checks due to cyanosis, discontinue vancomycin and change to Zyvox.  Dr. Mortimer Fries to place order for Zyvox.

## 2022-04-13 ENCOUNTER — Encounter: Payer: Self-pay | Admitting: Internal Medicine

## 2022-04-13 DIAGNOSIS — E1100 Type 2 diabetes mellitus with hyperosmolarity without nonketotic hyperglycemic-hyperosmolar coma (NKHHC): Secondary | ICD-10-CM | POA: Diagnosis not present

## 2022-04-13 DIAGNOSIS — J9601 Acute respiratory failure with hypoxia: Secondary | ICD-10-CM | POA: Diagnosis not present

## 2022-04-13 DIAGNOSIS — R4182 Altered mental status, unspecified: Secondary | ICD-10-CM | POA: Diagnosis not present

## 2022-04-13 DIAGNOSIS — A419 Sepsis, unspecified organism: Secondary | ICD-10-CM | POA: Diagnosis not present

## 2022-04-13 LAB — THYROID PANEL WITH TSH
Free Thyroxine Index: 2.4 (ref 1.2–4.9)
T3 Uptake Ratio: 37 % (ref 24–39)
T4, Total: 6.4 ug/dL (ref 4.5–12.0)
TSH: 0.424 u[IU]/mL — ABNORMAL LOW (ref 0.450–4.500)

## 2022-04-13 MED ORDER — LINEZOLID 600 MG/300ML IV SOLN
600.0000 mg | Freq: Two times a day (BID) | INTRAVENOUS | Status: DC
  Administered 2022-04-13: 600 mg via INTRAVENOUS
  Filled 2022-04-13 (×2): qty 300

## 2022-04-14 LAB — CULTURE, RESPIRATORY W GRAM STAIN

## 2022-04-14 LAB — URINE CULTURE: Culture: >=100000 — AB

## 2022-04-14 LAB — GLUCOSE, CAPILLARY: Glucose-Capillary: 419 mg/dL — ABNORMAL HIGH (ref 70–99)

## 2022-04-15 LAB — LEGIONELLA PNEUMOPHILA SEROGP 1 UR AG: L. pneumophila Serogp 1 Ur Ag: NEGATIVE

## 2022-04-16 LAB — CULTURE, BLOOD (ROUTINE X 2)
Culture: NO GROWTH
Culture: NO GROWTH

## 2022-05-07 NOTE — Consult Note (Signed)
PHARMACY CONSULT NOTE - FOLLOW UP  Pharmacy Consult for Electrolyte Monitoring and Replacement   Recent Labs: Potassium (mmol/L)  Date Value  04/12/2022 3.6  07/17/2012 3.8   Magnesium (mg/dL)  Date Value  01/01/2022 2.0   Calcium (mg/dL)  Date Value  04/12/2022 8.2 (L)   Calcium, Total (mg/dL)  Date Value  07/17/2012 9.5   Albumin (g/dL)  Date Value  04/12/2022 2.0 (L)  07/17/2012 3.8   Phosphorus (mg/dL)  Date Value  11/13/2021 2.9   Sodium (mmol/L)  Date Value  04/12/2022 158 (H)  07/17/2012 137     Assessment: 88 year old male with past medical history most significant for intracerebral hemorrhage and stroke admitted with acute metabolic encephalopathy, multifactorial shock (hypovolemic +/- septic), HHS, hypernatremia, Acute Hypoxic Respiratory Failure due to COVID infection and suspected superimposed bacterial/aspiration pneumonia, and AKI.  Goal of Therapy:  WNL  Plan:  No replacement at this time.  F/u with AM labs.    Christopher [name] ,PharmD Clinical Pharmacist 2022/04/22 1:21 PM

## 2022-05-07 NOTE — Progress Notes (Signed)
At 1616 patient was without heart rate via auscultation of apical pulse for one full minute by this nurse and Bryon Lions, RN, patient also without respirations.  Confirmed expiration of patient at 1616. Post-mortom care performed, patient with no personal effects, and elink contacted [employer] and notified this nurse patient was not a candidate.  Dr. Mortimer Fries reached out to patient's son and informed him of patient's passing.  Per Dr. Mortimer Fries patient's son to come in and see his father and will notify nursing supervisor when decision is made as to where the patient's remains will go. Son is in at bedside and informed other family members to come and understands we will need to proceed with moving the patient at 63.

## 2022-05-07 NOTE — Plan of Care (Addendum)
Patient with previous history of ICH and now with high risk for bleeding will need to stop heparin infusion, we are unable to obtain labs as patients blood is coagulating and showing signs of dying.  At this time, patient has severe cyanosis and hands/feet and mottled of skin. Will D/C LEVO which is only at 4 mcg and will d/c vasopressin as this is not helping and patient is in the dying process.   Unable to obtain finger sticks due to severe cyanosis of fingers/toes.  The Son understands that  is father is sick but does not grasp the futility of patient therapy and that he is in the  dying process.  Patient showing signs of brain damage and HIE  Patient is suffering and active dying process which is being prolonged by vent and pressors.  We discussed the possible need of dialysis and at this time, patient will NOT tolerate HD and the family was not interested in this treatment modality.    Unfortunately, patient showing signs of death despite escalation of care.  Family does NOT wish to withdraw as this is against their religion and they feel like they would be committing  homicide.  The plan is to continue NEO and vent support and IV abx at this time and await final moments of death  ADDITIONAL CC TIME 45 mins  Adriann Ballweg Patricia Pesa, M.D.  [facility]  Medical Director Early Director [facility] Cardio-Pulmonary Department

## 2022-05-07 NOTE — IPAL (Signed)
  Interdisciplinary Goals of Care Family Meeting   Date carried out: 04/23/2022  Location of the meeting: Phone conference  Member's involved: Physician, Nurse Practitioner, and Family Member or next of kin       GOALS OF CARE DISCUSSION  The Clinical status was relayed to family in [place]  Updated and notified of patients medical condition- Patient remains unresponsive and will not open eyes to command.   Patient is having a weak cough and struggling to remove secretions.   Patient with increased WOB and using accessory muscles to breathe Explained to family course of therapy and the modalities   Patient with Progressive multiorgan failure with a very high probablity of a very minimal chance of meaningful recovery despite all aggressive and optimal medical therapy.   Unfortunately, patient showing signs of death despite escalation of care.  Family does NOT wish to withdraw as this is against their religion and they feel like they would be committing  homicide.   The plan is to continue NEO and vent support and IV abx at this time and await final moments of death  Family are satisfied with Plan of action and management. All questions answered  Additional CC time 25 mins   Lakeisha Waldrop Patricia Pesa, M.D.  [facility]  Medical Director [place] Director [facility] Cardio-Pulmonary Department

## 2022-05-07 NOTE — Death Summary Note (Signed)
DEATH SUMMARY   Patient Details  Name: Christopher [name] MRN: [id number] DOB: [birth year]  Admission/Discharge Information   Admit Date:  2022-05-07  Date of Death:  05-09-2022   Time of Death:  16:16  Length of Stay: 2  Referring Physician: Benito Mccreedy, MD   Reason(s) for Hospitalization  COVID INFECTION/PNEUMONIA MRSA PNEUMONIA RESP FAILURE HEART FAILURE RENAL FAILURE  Diagnoses  Preliminary cause of death:  COVID INFECTION/PNEUMONIA MRSA PNEUMONIA RESP FAILURE HEART FAILURE RENAL FAILURE Secondary Diagnoses (including complications and co-morbidities):  Principal Problem:   Altered mental status, unspecified Active Problems:   Acute respiratory failure with hypoxia (HCC)   Hyperosmolar hyperglycemic state (HHS) [facility])   Brief Hospital Course (including significant findings, care, treatment, and services provided and events leading to death)   Brief Pt Description / Synopsis:  88 year old male with past medical history most significant for intracerebral hemorrhage and stroke admitted with acute metabolic encephalopathy, multifactorial shock (hypovolemic +/- septic +/- Cardiogenic), HHS, hypernatremia, Acute Hypoxic Respiratory Failure due to COVID infection and suspected superimposed bacterial/aspiration pneumonia, and AKI.  Required intubation and mechanical ventilation in the ED.   History of Present Illness:  Christopher [name] is a 88 y.o. male with a past medical history significant for intracranial hemorrhage, stroke who presents to [facility] ED on 05-07-22 from his nursing facility due to altered mental status (unresponsive ) of unknown onset and hypotension.  Patient is currently intubated and sedated, and family is currently unavailable, therefore history is obtained from chart review.   Per ED and nursing notes, the patient was found unresponsive with rapid and shallow respirations.  On exam he was noted to be febrile, tachycardic, and tachypneic with respiratory rate as  high as 60.   Patient had previously been DNR but was intubated several months ago.  Given his respiratory status, ER physician discussed with patient's son who wished to pursue aggressive care and have everything done just short of CPR.  Patient was intubated in the ED.   ED Course: Initial Vital Signs: Temperature 98.3 F axillary, respiratory rate 30, pulse 125, blood pressure 116/80, SPO2 96% Significant Labs: Sodium 152, chloride 117, glucose 868, bicarb 28, BUN 89, creatinine 2.06, alkaline phosphatase 180, albumin 2.3, BNP 110, high-sensitivity troponin 91, lactic acid 3.8, WBC 12.8 with neutrophilia, INR 1.4, PT 17.1 COVID-19 PCR is positive Urinalysis not concerning for UTI Urine drug screen negative Imaging Chest X-ray>>FINDINGS: Endotracheal tube tip is 2.5 cm above the carina. Stable cardiomediastinal silhouette with normal heart size and prominence of the aortic arch contour. No pneumothorax. Chronic slight blunting of the right costophrenic angle. No left pleural effusion. No overt pulmonary edema. Low lung volumes with streaky bibasilar lung opacities. CT head without contrast>>IMPRESSION: 1. No acute intracranial abnormality. 2. Generalized cerebral atrophy with widening of the extra-axial spaces and ventricular dilatation. 3. Right frontoparietal craniotomy defect. 4. Left maxillary sinus, bilateral ethmoid sinus and sphenoid sinus disease. Medications Administered: 2 L LR boluses, IV cefepime and vancomycin, insulin drip initiated     PCCM is asked to admit the patient for further work-up and treatment.  Please see "significant hospital events" section below for full detailed hospital course.     Pertinent  Medical History        Past Medical History:  Diagnosis Date   Arthritis     Ataxia     Diabetes mellitus without complication ([place])      controlled, pt checks it in the morning    Dyslipidemia     Hypertension  somewhat controlled, pt checks every  morning, he reports it has been good.   Stroke [facility])     TIA (transient ischemic attack)        Micro Data:  10/6: COVID-19 PCR>> positive 10/6: Influenza PCR>>Negative 10/6: Blood culture x2>> 10/6: Urine>> 10/6: Tracheal aspirate>> gram + cocci, gram + rods 10/6: Strep pneumo urinary antigen>>negative 10/6: Legionella urinary antigen>>   Antimicrobials:  Cefepime 10/6>> Vancomycin 10/6>>10/7 Linezolid 10/7>>   Significant Hospital Events: Including procedures, antibiotic start and stop dates in addition to other pertinent events   10/6: Presented to ED, required intubation in the ED.  PCCM asked to admit. 10/7: Started on Heparin gtt for NSTEMI troponin elevated from 91 to 3965 10/8: Critically ill, declining with severe hypoxia and shock, multiorgan failure. Unable to obtain labs (labs coagulating). Heparin gtt stopped due to high risk of bleeding with hx of ICH.  In the dying process   Interim History / Subjective:  -Remains critically ill with severe hypoxia, shock, an multiorgan failure -Afebrile, requiring 3 vasopressors (Levo, Neo, Vasopressin), all extremities mottled -Urine output decreasing, 445 cc last 24 hrs (net + 7.9 L) -Unable to obtain labs (labs coagulating). Heparin gtt stopped due to high risk of bleeding with hx of ICH and previous heparin level supra-therapeutic -Pupils are dilated and minimally responsive, but pt too unstable to transfer out of ICU to obtain CT scan       Objective   Blood pressure (!) 81/62, pulse (!) 30, temperature 98.8 F (37.1 C), resp. rate 15, height 5\' 2"  (1.575 m), weight 60.2 kg, SpO2 (!) 74 %.     >  Vent Mode: PRVC FiO2 (%):  [50 %-100 %] 100 % Set Rate:  [16 bmp] 16 bmp Vt Set:  [420 mL] 420 mL PEEP:  [5 OIN86-76 cmH20] 12 cmH20 Plateau Pressure:  [10 cmH20-22 cmH20] 22 cmH20      Intake/Output Summary (Last 24 hours) at 04/21/2022 0739 Last data filed at 04/12/2022 0400    Gross per 24 hour  Intake 4904.35 ml   Output 445 ml  Net 4459.35 ml          Filed Weights    04/12/22 0300 04/24/2022 0300  Weight: 54.8 kg 60.2 kg      Examination: General: Critically ill-appearing male, laying in bed, intubated sedated, no acute distress HENT: Atraumatic, normocephalic, neck supple, no JVD Lungs: Coarse breath sounds bilaterally, occasionally overbreathing the vent, even Cardiovascular: Tachycardia, regular rhythm, S1-S2, no murmurs, rubs, gallops Abdomen: Soft, nontender, nondistended, no guarding rebound tenderness, bowel sounds positive x4 Extremities: Normal bulk and tone, no deformities, no edema, all 4 extremities mottled Neuro: Sedated, currently unresponsive, pupils very sluggish at 4 mm bilaterally GU: Foley catheter in place draining dark yellow urine   Resolved Hospital Problem list       Assessment & Plan:    Acute Hypoxic Respiratory Failure in the setting of COVID-19 Infection and suspected superimposed bacterial pneumonia -Mechanical ventilation via ARDS protocol, target PRVC 6 cc/kg -Wean PEEP and FiO2 as able to maintain O2 sats >88% -Goal plateau pressure less than 30, driving pressure less than 15 -Paralytics if necessary for vent synchrony, gas exchange -Cycle prone positioning if necessary for oxygenation -Deep sedation per PAD protocol, goal RASS -4, currently fentanyl, midazolam -Diuresis as blood pressure and renal function can tolerate, goal CVP 5-8.   -VAP prevention order set -Steroids -Follow inflammatory markers: Ferritin, D-dimer, CRP, IL-6, LDH -Vitamin C, zinc -ABX as above   Hypotension:  Hypovolemic in setting of HHS +/-  Septic with COVID and pneumonia +/- Cardiogenic NSTEMI Acute Decompensated HFrEF (EF 25-30%) PMHx: HTN Echocardiogram 04/12/22 with LVEF 25-30%, Grade I DD -Continuous cardiac monitoring -Maintain MAP >65 -Cautious IV fluids -Vasopressors as needed to maintain MAP goal -Add Stress dose steroids -Trend lactic acid until  normalized -Trend HS Troponin until peaked -Diuresis as BP and renal function permits ~ unable to diurese currently due to shock -TSH pending -Heparin gtt discontinued given high bleeding risk with hx of ICH, difficultly obtained labs (coagulating) and previous heparin level supra therapeutic   Severe Sepsis in setting of COVID-19 Infection, suspected Superimposed bacterial/aspiration pneumonia -Monitor fever curve -Trend WBC's, Procalcitonin, & inflammatory markers -Follow cultures as above -Continue empiric Cefepime & Linezolid pending cultures & sensitivities   Hyperosmolar Hyperglycemic State (HHS), likely precipitated by COVID infection ~ RESOLVED PMHx: Diabetes Mellitus -CBG's q4h; Target range of 140 to 180 -SSI -Follow ICU Hypo/Hyperglycemia protocol -Diabetes Coordinator following, appreciate input -Hgb A1c 9.0   Acute Kidney Injury Hypernatremia in setting of dehydration with HHS Hyperchloremia -Monitor I&O's / urinary output -Follow BMP -Ensure adequate renal perfusion -Avoid nephrotoxic agents as able -Replace electrolytes as indicated -IV Fluids -Low threshold for nephrology consultation ~ given profound shock, likely would not tolerate even CRRT   Acute Metabolic Encephalopathy in setting of HHS, Hypernatremia, & AKI Sedation needs in setting of mechanical ventilation PMHx: ICH, CVA CT head on admission negative for acute intracranial abnormality Urine drug screen negative -Treatment of metabolic derangements as outlined above -Maintain a RASS goal of 0 to -1 -Fentanyl and Versed as needed to maintain RASS goal -Avoid sedating medications as able -Daily wake up assessment -Would like to obtain repeat CT Head, but pt too hemodynamically unstable to safely transport out of ICU     Pertinent Labs and Studies  Significant Diagnostic Studies ECHOCARDIOGRAM COMPLETE  Result Date: 04/12/2022    ECHOCARDIOGRAM REPORT   Patient Name:   DANNIE BARRESI Date of Exam:  04/12/2022 Medical Rec #:  [id number]    Height:       62.0 in Accession #:    [id number]   Weight:       120.8 lb Date of Birth:  1926-04-25    BSA:          1.543 m Patient Age:    6 years     BP:           89/56 mmHg Patient Gender: M            HR:           91 bpm. Exam Location:  [facility] Procedure: 2D Echo and 3D Echo Indications:     Acute respiratory distress R06.03  History:         Patient has prior history of Echocardiogram examinations, most                  recent 02/16/2019.  Sonographer:     Kathlen Brunswick RDCS Referring Phys:  [id number] Bradly Bienenstock Diagnosing Phys: Kate Sable MD IMPRESSIONS  1. Left ventricular ejection fraction, by estimation, is 25 to 30%. The left ventricle has severely decreased function. The left ventricle demonstrates global hypokinesis. There is mild left ventricular hypertrophy. Left ventricular diastolic parameters  are consistent with Grade I diastolic dysfunction (impaired relaxation).  2. Right ventricular systolic function is normal. The right ventricular size is normal.  3. The mitral valve is grossly normal. No evidence of mitral valve regurgitation.  4. Aortic valve not well visualized. recommend repeat echo for better aortic valve hemodynamic assessment when able.. The aortic valve is calcified. Aortic valve regurgitation is mild. Mild aortic valve stenosis. FINDINGS  Left Ventricle: Left ventricular ejection fraction, by estimation, is 25 to 30%. The left ventricle has severely decreased function. The left ventricle demonstrates global hypokinesis. The left ventricular internal cavity size was normal in size. There is mild left ventricular hypertrophy. Left ventricular diastolic parameters are consistent with Grade I diastolic dysfunction (impaired relaxation). Right Ventricle: The right ventricular size is normal. No increase in right ventricular wall thickness. Right ventricular systolic function is normal. Left Atrium: Left atrial size was normal in size.  Right Atrium: Right atrial size was normal in size. Pericardium: There is no evidence of pericardial effusion. Mitral Valve: The mitral valve is grossly normal. No evidence of mitral valve regurgitation. MV peak gradient, 8.8 mmHg. The mean mitral valve gradient is 3.0 mmHg. Tricuspid Valve: The tricuspid valve is grossly normal. Tricuspid valve regurgitation is not demonstrated. Aortic Valve: Aortic valve not well visualized. recommend repeat echo for better aortic valve hemodynamic assessment when able. The aortic valve is calcified. Aortic valve regurgitation is mild. Aortic regurgitation PHT measures 512 msec. Mild aortic stenosis is present. Aortic valve mean gradient measures 9.3 mmHg. Aortic valve peak gradient measures 17.1 mmHg. Aortic valve area, by VTI measures 0.86 cm. Pulmonic Valve: The pulmonic valve was not well visualized. Pulmonic valve regurgitation is not visualized. Aorta: The aortic root was not well visualized. Venous: IVC assessment for right atrial pressure unable to be performed due to mechanical ventilation. IAS/Shunts: No atrial level shunt detected by color flow Doppler.  LEFT VENTRICLE PLAX 2D LVIDd:         2.90 cm     Diastology LVIDs:         2.30 cm     LV e' medial:    12.90 cm/s LV PW:         1.30 cm     LV E/e' medial:  5.0 LV IVS:        1.50 cm     LV e' lateral:   4.13 cm/s LVOT diam:     1.80 cm     LV E/e' lateral: 15.7 LV SV:         28 LV SV Index:   18 LVOT Area:     2.54 cm                             3D Volume EF: LV Volumes (MOD)           3D EF:        40 % LV vol d, MOD A2C: 31.8 ml LV EDV:       88 ml LV vol d, MOD A4C: 31.0 ml LV ESV:       53 ml LV vol s, MOD A2C: 21.4 ml LV SV:        35 ml LV vol s, MOD A4C: 20.1 ml LV SV MOD A2C:     10.4 ml LV SV MOD A4C:     31.0 ml LV SV MOD BP:      11.0 ml RIGHT VENTRICLE RV Basal diam:  2.90 cm RV S prime:     13.55 cm/s TAPSE (M-mode): 2.1 cm LEFT ATRIUM             Index  RIGHT ATRIUM          Index LA diam:         4.20 cm 2.72 cm/m   RA Area:     9.63 cm LA Vol (A2C):   22.4 ml 14.52 ml/m  RA Volume:   19.60 ml 12.70 ml/m LA Vol (A4C):   19.3 ml 12.51 ml/m LA Biplane Vol: 22.4 ml 14.52 ml/m  AORTIC VALVE AV Area (Vmax):    0.78 cm AV Area (Vmean):   0.76 cm AV Area (VTI):     0.86 cm AV Vmax:           206.60 cm/s AV Vmean:          142.667 cm/s AV VTI:            0.323 m AV Peak Grad:      17.1 mmHg AV Mean Grad:      9.3 mmHg LVOT Vmax:         63.50 cm/s LVOT Vmean:        42.450 cm/s LVOT VTI:          0.110 m LVOT/AV VTI ratio: 0.34 AI PHT:            512 msec  AORTA Ao Root diam: 2.60 cm MITRAL VALVE                TRICUSPID VALVE MV Area (PHT): 5.27 cm     TR Peak grad:   19.9 mmHg MV Area VTI:   1.12 cm     TR Vmax:        223.00 cm/s MV Peak grad:  8.8 mmHg MV Mean grad:  3.0 mmHg     SHUNTS MV Vmax:       1.48 m/s     Systemic VTI:  0.11 m MV Vmean:      83.5 cm/s    Systemic Diam: 1.80 cm MV Decel Time: 144 msec MV E velocity: 64.80 cm/s MV A velocity: 125.50 cm/s MV E/A ratio:  0.52 Kate Sable MD Electronically signed by Kate Sable MD Signature Date/Time: 04/12/2022/1:18:46 PM    Final    DG Chest Port 1 View  Result Date: 04/12/2022 CLINICAL DATA:  88 year old male with history of acute respiratory failure with hypoxia. EXAM: PORTABLE CHEST 1 VIEW COMPARISON:  Chest x-ray 04/12/2022. FINDINGS: An endotracheal tube is in place with tip 3.0 cm above the carina. A nasogastric tube is seen extending into the stomach, however, the tip of the nasogastric tube extends below the lower margin of the image. There is a right-sided internal jugular central venous catheter with tip terminating in the superior cavoatrial junction. Mild diffuse interstitial prominence and peribronchial cuffing. Ill-defined opacities in the left lung base, favored to predominantly reflect subsegmental atelectasis. Mild blunting of the costophrenic sulci, suggesting trace bilateral pleural effusions. No  pneumothorax. No evidence of pulmonary edema. Heart size is normal. Upper mediastinal contours are within normal limits. Atherosclerotic calcifications are noted in the thoracic aorta. IMPRESSION: 1. Support apparatus, as above. 2. Mild diffuse peribronchial cuffing and interstitial prominence, which may suggest a mild bronchitis. 3. Trace bilateral pleural effusions. 4. Probable subsegmental atelectasis in the left lung base. Electronically Signed   By: Vinnie Langton M.D.   On: 04/12/2022 05:44   DG Chest Port 1 View  Result Date: 04/19/2022 CLINICAL DATA:  Central line placement EXAM: PORTABLE CHEST 1 VIEW COMPARISON:  05/05/2022 FINDINGS: An endotracheal tube is present with tip measuring about 2 cm  above the carina. An enteric tube has been placed. Tip is off the field of view but below the left hemidiaphragm. Right central venous catheter with tip over the cavoatrial junction region. Lungs are clear. Calcification of the aorta. No pleural effusions. No pneumothorax. IMPRESSION: Appliances appear in satisfactory position. Lungs are clear. Aortic atherosclerosis. Electronically Signed   By: Lucienne Capers M.D.   On: 04/25/2022 21:18   CT Head Wo Contrast  Result Date: 04/10/2022 CLINICAL DATA:  Altered mental status. EXAM: CT HEAD WITHOUT CONTRAST TECHNIQUE: Contiguous axial images were obtained from the base of the skull through the vertex without intravenous contrast. RADIATION DOSE REDUCTION: This exam was performed according to the departmental dose-optimization program which includes automated exposure control, adjustment of the mA and/or kV according to patient size and/or use of iterative reconstruction technique. COMPARISON:  Nov 04, 2021 FINDINGS: Brain: There is mild cerebral atrophy with widening of the extra-axial spaces and ventricular dilatation. There are areas of decreased attenuation within the white matter tracts of the supratentorial brain, consistent with microvascular disease  changes. Vascular: No hyperdense vessel or unexpected calcification. Skull: A right frontal parietal craniotomy defect is noted. Sinuses/Orbits: There is mild left maxillary sinus and mild bilateral ethmoid sinus mucosal thickening. Marked severity sphenoid sinus mucosal thickening is also noted. Other: None. IMPRESSION: 1. No acute intracranial abnormality. 2. Generalized cerebral atrophy with widening of the extra-axial spaces and ventricular dilatation. 3. Right frontoparietal craniotomy defect. 4. Left maxillary sinus, bilateral ethmoid sinus and sphenoid sinus disease. Electronically Signed   By: Virgina Norfolk M.D.   On: 05/01/2022 17:20   DG Chest Portable 1 View  Result Date: 04/20/2022 CLINICAL DATA:  Enteric catheter placement EXAM: PORTABLE CHEST 1 VIEW COMPARISON:  04/08/2022 FINDINGS: Single frontal view of the chest demonstrates endotracheal tube overlying tracheal air column tip well above carina. Enteric catheter passes below diaphragm tip overlying the gastric fundus. Side port projects just above the gastroesophageal junction. The cardiac silhouette is stable. Continued ectasia and atherosclerosis of the thoracic aorta. Improved aeration at the lung bases. Continued trace right pleural effusion versus pleural thickening. No pneumothorax. IMPRESSION: 1. Enteric catheter tip projecting over gastric fundus, side port projecting just above gastroesophageal junction. 2. Improved aeration at the lung bases. 3. Stable trace right pleural effusion versus pleural thickening. Electronically Signed   By: Randa Ngo M.D.   On: 04/24/2022 15:49   DG Chest Port 1 View  Result Date: 04/16/2022 CLINICAL DATA:  Intubated EXAM: PORTABLE CHEST 1 VIEW COMPARISON:  11/13/2021 chest radiograph. FINDINGS: Endotracheal tube tip is 2.5 cm above the carina. Stable cardiomediastinal silhouette with normal heart size and prominence of the aortic arch contour. No pneumothorax. Chronic slight blunting of the  right costophrenic angle. No left pleural effusion. No overt pulmonary edema. Low lung volumes with streaky bibasilar lung opacities. IMPRESSION: 1. Endotracheal tube tip is 2.5 cm above the carina. 2. Low lung volumes with streaky bibasilar lung opacities, favor atelectasis. 3. Chronic slight blunting of the right costophrenic angle, cannot exclude trace right pleural effusion. Electronically Signed   By: Ilona Sorrel M.D.   On: 04/20/2022 14:56   DG ABDOMEN PEG TUBE LOCATION  Result Date: 04/08/2022 CLINICAL DATA:  G-tube check EXAM: ABDOMEN - 1 VIEW COMPARISON:  01/15/2022 FINDINGS: Single AP portable supine view of the abdomen was obtained following injection of 20 mL of Gastrografin contrast through patient's existing percutaneous gastrostomy tube. Contrast is seen filling the gastric lumen and extending into the duodenum. No extraluminal  contrast collections are evident. Nonobstructive bowel gas pattern. IMPRESSION: Satisfactory position of percutaneous gastrostomy tube. Electronically Signed   By: Davina Poke D.O.   On: 04/08/2022 12:05    Microbiology Recent Results (from the past 240 hour(s))  Blood Culture (routine x 2)     Status: None (Preliminary result)   Collection Time: 04/30/2022  1:46 PM   Specimen: BLOOD RIGHT ARM  Result Value Ref Range Status   Specimen Description BLOOD RIGHT ARM  Final   Special Requests   Final    BOTTLES DRAWN AEROBIC AND ANAEROBIC Blood Culture results may not be optimal due to an inadequate volume of blood received in culture bottles   Culture   Final    NO GROWTH 2 DAYS Performed at [facility], [address] [place], [place] [address]    Report Status PENDING  Incomplete  Blood Culture (routine x 2)     Status: None (Preliminary result)   Collection Time: 04/19/2022  1:46 PM   Specimen: BLOOD LEFT FOREARM  Result Value Ref Range Status   Specimen Description BLOOD LEFT FOREARM  Final   Special Requests   Final    BOTTLES DRAWN  AEROBIC AND ANAEROBIC Blood Culture results may not be optimal due to an inadequate volume of blood received in culture bottles   Culture   Final    NO GROWTH 2 DAYS Performed at [facility], [address], [place], [place] [address]    Report Status PENDING  Incomplete  Resp Panel by RT-PCR (Flu A&B, Covid) Anterior Nasal Swab     Status: Abnormal   Collection Time: 04/23/2022  1:57 PM   Specimen: Anterior Nasal Swab  Result Value Ref Range Status   SARS Coronavirus 2 by RT PCR POSITIVE (A) NEGATIVE Final    Comment: (NOTE) SARS-CoV-2 target nucleic acids are DETECTED.  The SARS-CoV-2 RNA is generally detectable in upper respiratory specimens during the acute phase of infection. Positive results are indicative of the presence of the identified virus, but do not rule out bacterial infection or co-infection with other pathogens not detected by the test. Clinical correlation with patient history and other diagnostic information is necessary to determine patient infection status. The expected result is Negative.  Fact Sheet for Patients: EntrepreneurPulse.com.au  Fact Sheet for Healthcare Providers: IncredibleEmployment.be  This test is not yet approved or cleared by the [place] FDA and  has been authorized for detection and/or diagnosis of SARS-CoV-2 by FDA under an Emergency Use Authorization (EUA).  This EUA will remain in effect (meaning this test can be used) for the duration of  the COVID-19 declaration under Section 564(b)(1) of the A ct, 21 U.S.C. section 360bbb-3(b)(1), unless the authorization is terminated or revoked sooner.     Influenza A by PCR NEGATIVE NEGATIVE Final   Influenza B by PCR NEGATIVE NEGATIVE Final    Comment: (NOTE) The Xpert Xpress SARS-CoV-2/FLU/RSV plus assay is intended as an aid in the diagnosis of influenza from Nasopharyngeal swab specimens and should not be used as a sole basis for  treatment. Nasal washings and aspirates are unacceptable for Xpert Xpress SARS-CoV-2/FLU/RSV testing.  Fact Sheet for Patients: EntrepreneurPulse.com.au  Fact Sheet for Healthcare Providers: IncredibleEmployment.be  This test is not yet approved or cleared by the [place] FDA and has been authorized for detection and/or diagnosis of SARS-CoV-2 by FDA under an Emergency Use Authorization (EUA). This EUA will remain in effect (meaning this test can be used) for the duration of  the COVID-19 declaration under Section 564(b)(1) of the Act, 21 U.S.C. section 360bbb-3(b)(1), unless the authorization is terminated or revoked.  Performed at [facility], [address] [place], [place] [address]   Urine Culture     Status: Abnormal (Preliminary result)   Collection Time: 04/18/2022  1:57 PM   Specimen: In/Out Cath Urine  Result Value Ref Range Status   Specimen Description   Final    IN/OUT CATH URINE Performed at [facility], [address] [place], [place] [address]    Special Requests   Final    NONE Performed at [facility], [address] [place], [place] [address]    Culture (A)  Final    >=100,000 COLONIES/mL PSEUDOMONAS AERUGINOSA SUSCEPTIBILITIES TO FOLLOW Performed at [facility], [place] [address] [place], [place] [address]    Report Status PENDING  Incomplete  MRSA Next Gen by PCR, Nasal     Status: Abnormal   Collection Time: 04/15/2022  3:42 PM   Specimen: Nasal Mucosa; Nasal Swab  Result Value Ref Range Status   MRSA by PCR Next Gen DETECTED (A) NOT DETECTED Final    Comment: RESULT CALLED TO, READ BACK BY AND VERIFIED WITH: RN  ANN FORGLEMAN ON 04/23/2022@2112  RP (NOTE) The GeneXpert MRSA Assay (FDA approved for NASAL specimens only), is one component of a comprehensive MRSA colonization surveillance program. It is not intended to diagnose MRSA infection nor to guide or monitor treatment  for MRSA infections. Test performance is not FDA approved in patients less than 30 years old. Performed at [facility], [address] [place], [place] [address]   Culture, Respiratory w Gram Stain     Status: None (Preliminary result)   Collection Time: 05/04/2022  9:11 PM   Specimen: Tracheal Aspirate; Respiratory  Result Value Ref Range Status   Specimen Description   Final    TRACHEAL ASPIRATE Performed at [facility], [address] [place], [place] [address]    Special Requests   Final    NONE Performed at [facility], [address] [place], [place] [address]    Gram Stain   Final    MODERATE GRAM POSITIVE COCCI IN PAIRS IN CLUSTERS FEW GRAM POSITIVE RODS RARE WBC PRESENT, PREDOMINANTLY PMN RARE YEAST    Culture   Final    ABUNDANT STAPHYLOCOCCUS AUREUS SUSCEPTIBILITIES TO FOLLOW ABUNDANT GROUP B STREP(S.AGALACTIAE)ISOLATED TESTING AGAINST S. AGALACTIAE NOT ROUTINELY PERFORMED DUE TO PREDICTABILITY OF AMP/PEN/VAN SUSCEPTIBILITY. Performed at [facility], [place] [address] [place], [place] [address]    Report Status PENDING  Incomplete    Lab Basic Metabolic Panel: Recent Labs  Lab 04/16/2022 1346 04/26/2022 2114 04/12/22 0107 04/12/22 0437 04/12/22 0952  NA 152* 161* 159* 157* 158*  K 4.8 3.4* 3.4* 3.5 3.6  CL 117* 127* 128* 129* 129*  CO2 28 25 24 25 27   GLUCOSE 868* 79 178* 250* 174*  BUN 89* 76* 72* 67* 63*  CREATININE 2.06* 1.64* 1.52* 1.45* 1.21  CALCIUM 8.9 9.0 8.4* 8.4* 8.2*   Liver Function Tests: Recent Labs  Lab 04/21/2022 1346 04/12/22 0437  AST 30 28  ALT 16 14  ALKPHOS 180* 129*  BILITOT 0.6 0.5  PROT 7.4 6.1*  ALBUMIN 2.3* 2.0*   No results for input(s): "LIPASE", "AMYLASE" in the last 168 hours. No results for input(s): "AMMONIA" in the last 168 hours. CBC: Recent Labs  Lab 04/12/2022 1346 04/12/22 0437  WBC 12.8* 20.8*  NEUTROABS 10.2*  --   HGB 14.0 12.0*  HCT 47.1 40.5  MCV 88.5 88.2   PLT 264 211   Cardiac Enzymes: No results for input(s): "CKTOTAL", "CKMB", "CKMBINDEX", "TROPONINI" in the last 168 hours. Sepsis Labs: Recent Labs  Lab 04/06/2022 1345 05/03/2022 1346 04/24/2022 2114 04/12/22 0437  PROCALCITON 0.30  --   --  0.65  WBC  --  12.8*  --  20.8*  LATICACIDVEN  --  3.8* 2.3*  --     GOALS OF CARE DISCUSSION  Patient with previous history of ICH and now with high risk for bleeding will need to stop heparin infusion, we are unable to obtain labs as patients blood is coagulating and showing signs of dying.   At this time, patient has severe cyanosis and hands/feet and mottled of skin. Will D/C LEVO which is only at 4 mcg and will d/c vasopressin as this is not helping and patient is in the dying process.     Unable to obtain finger sticks due to severe cyanosis of fingers/toes.   The Son understands that  is father is sick but does not grasp the futility of patient therapy and that he is in the  dying process.   Patient showing signs of brain damage and HIE   Patient is suffering and active dying process which is being prolonged by vent and pressors.   We discussed the possible need of dialysis and at this time, patient will NOT tolerate HD and the family was not interested in this treatment modality.      Unfortunately, patient showing signs of death despite escalation of care.  Family does NOT wish to withdraw as this is against their religion and they feel like they would be committing  homicide.   The plan is to continue NEO and vent support and IV abx at this time and await final moments of death     Family are satisfied with Plan of action and management. All questions answered          Flora Lipps 05/07/2022, 4:34 PM

## 2022-05-07 NOTE — Progress Notes (Signed)
NAME:  Christopher [name], MRN:  [id number], DOB:  09/09/1925, LOS: 2 ADMISSION DATE:  2022-04-17, CONSULTATION DATE:  04-17-2022 REFERRING MD:  Dr. Cherylann Banas, CHIEF COMPLAINT:  Altered Mental Status, hypotension, respiratory distress   Brief Pt Description / Synopsis:  88 year old male with past medical history most significant for intracerebral hemorrhage and stroke admitted with acute metabolic encephalopathy, multifactorial shock (hypovolemic +/- septic +/- Cardiogenic), HHS, hypernatremia, Acute Hypoxic Respiratory Failure due to COVID infection and suspected superimposed bacterial/aspiration pneumonia, and AKI.  Required intubation and mechanical ventilation in the ED.  History of Present Illness:  Christopher [name] is a 88 y.o. male with a past medical history significant for intracranial hemorrhage, stroke who presents to [facility] ED on 04-17-22 from his nursing facility due to altered mental status (unresponsive ) of unknown onset and hypotension.  Patient is currently intubated and sedated, and family is currently unavailable, therefore history is obtained from chart review.  Per ED and nursing notes, the patient was found unresponsive with rapid and shallow respirations.  On exam he was noted to be febrile, tachycardic, and tachypneic with respiratory rate as high as 60.  Patient had previously been DNR but was intubated several months ago.  Given his respiratory status, ER physician discussed with patient's son who wished to pursue aggressive care and have everything done just short of CPR.  Patient was intubated in the ED.  ED Course: Initial Vital Signs: Temperature 98.3 F axillary, respiratory rate 30, pulse 125, blood pressure 116/80, SPO2 96% Significant Labs: Sodium 152, chloride 117, glucose 868, bicarb 28, BUN 89, creatinine 2.06, alkaline phosphatase 180, albumin 2.3, BNP 110, high-sensitivity troponin 91, lactic acid 3.8, WBC 12.8 with neutrophilia, INR 1.4, PT 17.1 COVID-19 PCR is  positive Urinalysis not concerning for UTI Urine drug screen negative Imaging Chest X-ray>>FINDINGS: Endotracheal tube tip is 2.5 cm above the carina. Stable cardiomediastinal silhouette with normal heart size and prominence of the aortic arch contour. No pneumothorax. Chronic slight blunting of the right costophrenic angle. No left pleural effusion. No overt pulmonary edema. Low lung volumes with streaky bibasilar lung opacities. CT head without contrast>>IMPRESSION: 1. No acute intracranial abnormality. 2. Generalized cerebral atrophy with widening of the extra-axial spaces and ventricular dilatation. 3. Right frontoparietal craniotomy defect. 4. Left maxillary sinus, bilateral ethmoid sinus and sphenoid sinus disease. Medications Administered: 2 L LR boluses, IV cefepime and vancomycin, insulin drip initiated   PCCM is asked to admit the patient for further work-up and treatment.  Please see "significant hospital events" section below for full detailed hospital course.   Pertinent  Medical History   Past Medical History:  Diagnosis Date   Arthritis    Ataxia    Diabetes mellitus without complication ([place])    controlled, pt checks it in the morning    Dyslipidemia    Hypertension    somewhat controlled, pt checks every morning, he reports it has been good.   Stroke [facility])    TIA (transient ischemic attack)     Micro Data:  10/6: COVID-19 PCR>> positive 10/6: Influenza PCR>>Negative 10/6: Blood culture x2>> 10/6: Urine>> 10/6: Tracheal aspirate>> gram + cocci, gram + rods 10/6: Strep pneumo urinary antigen>>negative 10/6: Legionella urinary antigen>>  Antimicrobials:  Cefepime 10/6>> Vancomycin 10/6>>10/7 Linezolid 10/7>>  Significant Hospital Events: Including procedures, antibiotic start and stop dates in addition to other pertinent events   10/6: Presented to ED, required intubation in the ED.  PCCM asked to admit. 10/7: Started on Heparin gtt for NSTEMI  troponin elevated  from 91 to 3965 10/8: Critically ill, declining with severe hypoxia and shock, multiorgan failure. Unable to obtain labs (labs coagulating). Heparin gtt stopped due to high risk of bleeding with hx of ICH.  In the dying process  Interim History / Subjective:  -Remains critically ill with severe hypoxia, shock, an multiorgan failure -Afebrile, requiring 3 vasopressors (Levo, Neo, Vasopressin), all extremities mottled -Urine output decreasing, 445 cc last 24 hrs (net + 7.9 L) -Unable to obtain labs (labs coagulating). Heparin gtt stopped due to high risk of bleeding with hx of ICH and previous heparin level supra-therapeutic -Pupils are dilated and minimally responsive, but pt too unstable to transfer out of ICU to obtain CT scan    Objective   Blood pressure (!) 81/62, pulse (!) 30, temperature 98.8 F (37.1 C), resp. rate 15, height 5\' 2"  (1.575 m), weight 60.2 kg, SpO2 (!) 74 %.    Vent Mode: PRVC FiO2 (%):  [50 %-100 %] 100 % Set Rate:  [16 bmp] 16 bmp Vt Set:  [420 mL] 420 mL PEEP:  [5 [id number] cmH20] 12 cmH20 Plateau Pressure:  [10 cmH20-22 cmH20] 22 cmH20   Intake/Output Summary (Last 24 hours) at 05/12/2022 0739 Last data filed at 12-May-2022 0400 Gross per 24 hour  Intake 4904.35 ml  Output 445 ml  Net 4459.35 ml    Filed Weights   04/12/22 0300 2022/05/12 0300  Weight: 54.8 kg 60.2 kg    Examination: General: Critically ill-appearing male, laying in bed, intubated sedated, no acute distress HENT: Atraumatic, normocephalic, neck supple, no JVD Lungs: Coarse breath sounds bilaterally, occasionally overbreathing the vent, even Cardiovascular: Tachycardia, regular rhythm, S1-S2, no murmurs, rubs, gallops Abdomen: Soft, nontender, nondistended, no guarding rebound tenderness, bowel sounds positive x4 Extremities: Normal bulk and tone, no deformities, no edema, all 4 extremities mottled Neuro: Sedated, currently unresponsive, pupils very sluggish at 4 mm  bilaterally GU: Foley catheter in place draining dark yellow urine  Resolved Hospital Problem list     Assessment & Plan:   Acute Hypoxic Respiratory Failure in the setting of COVID-19 Infection and suspected superimposed bacterial pneumonia -Mechanical ventilation via ARDS protocol, target PRVC 6 cc/kg -Wean PEEP and FiO2 as able to maintain O2 sats >88% -Goal plateau pressure less than 30, driving pressure less than 15 -Paralytics if necessary for vent synchrony, gas exchange -Cycle prone positioning if necessary for oxygenation -Deep sedation per PAD protocol, goal RASS -4, currently fentanyl, midazolam -Diuresis as blood pressure and renal function can tolerate, goal CVP 5-8.   -VAP prevention order set -Steroids -Follow inflammatory markers: Ferritin, D-dimer, CRP, IL-6, LDH -Vitamin C, zinc -ABX as above  Hypotension: Hypovolemic in setting of HHS +/-  Septic with COVID and pneumonia +/- Cardiogenic NSTEMI Acute Decompensated HFrEF (EF 25-30%) PMHx: HTN Echocardiogram 04/12/22 with LVEF 25-30%, Grade I DD -Continuous cardiac monitoring -Maintain MAP >65 -Cautious IV fluids -Vasopressors as needed to maintain MAP goal -Add Stress dose steroids -Trend lactic acid until normalized -Trend HS Troponin until peaked -Diuresis as BP and renal function permits ~ unable to diurese currently due to shock -TSH pending -Heparin gtt discontinued given high bleeding risk with hx of ICH, difficultly obtained labs (coagulating) and previous heparin level supra therapeutic  Severe Sepsis in setting of COVID-19 Infection, suspected Superimposed bacterial/aspiration pneumonia -Monitor fever curve -Trend WBC's, Procalcitonin, & inflammatory markers -Follow cultures as above -Continue empiric Cefepime & Linezolid pending cultures & sensitivities  Hyperosmolar Hyperglycemic State (HHS), likely precipitated by COVID infection ~ RESOLVED PMHx: Diabetes  Mellitus -CBG's q4h; Target range of  140 to 180 -SSI -Follow ICU Hypo/Hyperglycemia protocol -Diabetes Coordinator following, appreciate input -Hgb A1c 9.0  Acute Kidney Injury Hypernatremia in setting of dehydration with HHS Hyperchloremia -Monitor I&O's / urinary output -Follow BMP -Ensure adequate renal perfusion -Avoid nephrotoxic agents as able -Replace electrolytes as indicated -IV Fluids -Low threshold for nephrology consultation ~ given profound shock, likely would not tolerate even CRRT  Acute Metabolic Encephalopathy in setting of HHS, Hypernatremia, & AKI Sedation needs in setting of mechanical ventilation PMHx: ICH, CVA CT head on admission negative for acute intracranial abnormality Urine drug screen negative -Treatment of metabolic derangements as outlined above -Maintain a RASS goal of 0 to -1 -Fentanyl and Versed as needed to maintain RASS goal -Avoid sedating medications as able -Daily wake up assessment -Would like to obtain repeat CT Head, but pt too hemodynamically unstable to safely transport out of ICU    Pt is critically ill with multiorgan failure.  High risk for further decompensation, cardiac arrest, and death.  Pt is DNR status.  Recommend consideration for transition to comfort measures.   Best Practice (right click and "Reselect all SmartList Selections" daily)   Diet/type: NPO DVT prophylaxis: SCD GI prophylaxis: PPI Lines: central line and a-line, both still needed Foley:  yes, and is still needed Code Status:  DNR Last date of multidisciplinary goals of care discussion [N/A]  Will update patient's son when he arrives to bedside.  Labs   CBC: Recent Labs  Lab 04/10/2022 1346 04/12/22 0437  WBC 12.8* 20.8*  NEUTROABS 10.2*  --   HGB 14.0 12.0*  HCT 47.1 40.5  MCV 88.5 88.2  PLT 264 211     Basic Metabolic Panel: Recent Labs  Lab 05/06/2022 1346 04/16/2022 2114 04/12/22 0107 04/12/22 0437 04/12/22 0952  NA 152* 161* 159* 157* 158*  K 4.8 3.4* 3.4* 3.5 3.6  CL  117* 127* 128* 129* 129*  CO2 28 25 24 25 27   GLUCOSE 868* 79 178* 250* 174*  BUN 89* 76* 72* 67* 63*  CREATININE 2.06* 1.64* 1.52* 1.45* 1.21  CALCIUM 8.9 9.0 8.4* 8.4* 8.2*    GFR: Estimated Creatinine Clearance: 28.8 mL/min (by C-G formula based on SCr of 1.21 mg/dL). Recent Labs  Lab 04/22/2022 1345 04/17/2022 1346 05/03/2022 2114 04/12/22 0437  PROCALCITON 0.30  --   --  0.65  WBC  --  12.8*  --  20.8*  LATICACIDVEN  --  3.8* 2.3*  --      Liver Function Tests: Recent Labs  Lab 04/25/2022 1346 04/12/22 0437  AST 30 28  ALT 16 14  ALKPHOS 180* 129*  BILITOT 0.6 0.5  PROT 7.4 6.1*  ALBUMIN 2.3* 2.0*    No results for input(s): "LIPASE", "AMYLASE" in the last 168 hours. No results for input(s): "AMMONIA" in the last 168 hours.  ABG    Component Value Date/Time   PHART 7.36 04/12/2022 0443   PCO2ART 48 04/12/2022 0443   PO2ART 208 (H) 04/12/2022 0443   HCO3 27.1 04/12/2022 0443   TCO2 25 10/15/2016 0928   ACIDBASEDEF 1.1 04/09/2022 1421   O2SAT 99.8 04/12/2022 0443     Coagulation Profile: Recent Labs  Lab 04/23/2022 1346  INR 1.4*     Cardiac Enzymes: No results for input(s): "CKTOTAL", "CKMB", "CKMBINDEX", "TROPONINI" in the last 168 hours.  HbA1C: Hgb A1c MFr Bld  Date/Time Value Ref Range Status  04/12/2022 04:37 AM 9.0 (H) 4.8 - 5.6 % Final  Comment:    (NOTE) Pre diabetes:          5.7%-6.4%  Diabetes:              >6.4%  Glycemic control for   <7.0% adults with diabetes   10/25/2021 11:40 AM 7.6 (H) 4.8 - 5.6 % Final    Comment:    (NOTE) Pre diabetes:          5.7%-6.4%  Diabetes:              >6.4%  Glycemic control for   <7.0% adults with diabetes     CBG: Recent Labs  Lab 04/12/22 0502 04/12/22 0557 04/12/22 0757 04/12/22 1148 04/12/22 1543  GLUCAP 193* 193* 203* 106* 51*    Review of Systems:   Unable to assess due to intubation/sedation/AMS   Past Medical History:  He,  has a past medical history of Arthritis,  Ataxia, Diabetes mellitus without complication ([place]), Dyslipidemia, Hypertension, Stroke ([place]), and TIA (transient ischemic attack).   Surgical History:   Past Surgical History:  Procedure Laterality Date   CRANIOTOMY Right 11/02/2021   Procedure: CRANIOTOMY HEMATOMA EVACUATION SUBDURAL;  Surgeon: Deetta Perla, MD;  Location: [facility] ORS;  Service: Neurosurgery;  Laterality: Right;   IR GASTROSTOMY TUBE MOD SED  11/11/2021   IR REPLACE G-TUBE SIMPLE WO FLUORO  01/15/2022   none     TRACHEOSTOMY TUBE PLACEMENT  11/02/2021   Procedure: TRACHEOSTOMY;  Surgeon: Deetta Perla, MD;  Location: [facility] ORS;  Service: Neurosurgery;;     Social History:   reports that he has never smoked. He has never used smokeless tobacco. He reports that he does not drink alcohol and does not use drugs.   Family History:  His family history is negative for CAD and Hypertension.   Allergies Allergies  Allergen Reactions   Hydrochlorothiazide     Unknown reaction   Beef-Derived Products Other (See Comments)    Does not eat due to religious beliefs   Hydrochlorothiazide Other (See Comments)    Unknown reaction   Penicillins Other (See Comments)    Unknown reaction   Pork-Derived Products Other (See Comments)    Does not eat due to religious beliefs     Home Medications  Prior to Admission medications   Medication Sig Start Date End Date Taking? Authorizing Provider  amantadine (SYMMETREL) 50 MG/5ML solution Take by mouth. 03/25/22  Yes [provider]  donepezil (ARICEPT) 5 MG tablet Take 5 mg by mouth daily. 08/13/16  Yes [provider]  insulin glargine (SEMGLEE, YFGN,) 100 UNIT/ML Solostar Pen Inject 35 Units into the skin daily.   Yes [provider]  lansoprazole (PREVACID SOLUTAB) 30 MG disintegrating tablet Take 30 mg by mouth daily.   Yes [provider]  melatonin 3 MG TABS tablet Place 3 mg into feeding tube at bedtime.   Yes [provider]  metFORMIN  (GLUCOPHAGE) 1000 MG tablet Take 1,000 mg by mouth 2 (two) times daily. 10/06/21  Yes [provider]  Multiple Vitamin (MULTI-VITAMINS) TABS Take 1 tablet by mouth daily.   Yes [provider]  pantoprazole sodium (PROTONIX) 40 mg Place 40 mg into feeding tube daily. 11/14/21  Yes Nolberto Hanlon, MD  polyethylene glycol (MIRALAX / GLYCOLAX) 17 g packet Place 17 g into feeding tube daily as needed for mild constipation. Patient taking differently: Take 17 g by mouth daily. 11/14/21  Yes Nolberto Hanlon, MD  pravastatin (PRAVACHOL) 40 MG tablet Take 1 tablet (40 mg  total) by mouth daily. 07/05/15  Yes Epifanio Lesches, MD  acetaminophen (TYLENOL) 650 MG suppository Place 1 suppository (650 mg total) rectally every 6 (six) hours as needed for fever or mild pain. 11/14/21   Nolberto Hanlon, MD  amLODipine (NORVASC) 10 MG tablet Take 1 tablet (10 mg total) by mouth daily. Patient not taking: Reported on 04/21/2022 01/01/17   Bettey Costa, MD  dupilumab (DUPIXENT) 300 MG/2ML prefilled syringe Inject 300 mg into the skin every 14 (fourteen) days. Starting at day 15 for maintenance. 05/06/21   Ralene Bathe, MD  guaiFENesin 200 MG tablet Place 200 mg into feeding tube every 8 (eight) hours.    [provider]  insulin aspart (NOVOLOG) 100 UNIT/ML injection Inject 0-9 Units into the skin every 4 (four) hours. Patient not taking: Reported on 04/14/2022 11/14/21   Nolberto Hanlon, MD  insulin detemir (LEVEMIR) 100 UNIT/ML injection Inject 0.09 mLs (9 Units total) into the skin 2 (two) times daily. Patient not taking: Reported on 04/23/2022 11/14/21   Nolberto Hanlon, MD  ipratropium-albuterol (DUONEB) 0.5-2.5 (3) MG/3ML SOLN Take 3 mLs by nebulization every 6 (six) hours. Patient not taking: Reported on 04/27/2022 11/14/21   Nolberto Hanlon, MD  metoprolol succinate (TOPROL-XL) 100 MG 24 hr tablet Take 1 tablet (100 mg total) by mouth daily. Take with or immediately following a meal. Patient not taking:  Reported on 04/22/2022 01/01/17   Bettey Costa, MD  Nutritional Supplements (FEEDING SUPPLEMENT, OSMOLITE 1.5 CAL,) LIQD Place 1,000 mLs into feeding tube continuous.    [provider]  oxyCODONE (OXY IR/ROXICODONE) 5 MG immediate release tablet Place 5 mg into feeding tube every 6 (six) hours as needed (pain).    [provider]  sertraline (ZOLOFT) 25 MG tablet Take 25 mg by mouth at bedtime. 10/06/21   [provider]  TRADJENTA 5 MG TABS tablet Take 5 mg by mouth daily. Patient not taking: Reported on 04/18/2022 10/06/21   [provider]  triamcinolone cream (KENALOG) 0.1 % Apply 1 application topically 2 (two) times daily as needed. 11/05/20   Ralene Bathe, MD     Critical care time: 40 minutes     Darel Hong, AGACNP-BC [facility] Prefer epic messenger for cross cover needs If after hours, please call E-link

## 2022-05-07 NOTE — Plan of Care (Signed)
Continuing with plan of care. 

## 2022-05-07 DEATH — deceased
